# Patient Record
Sex: Female | Born: 1980 | Race: White | Hispanic: No | Marital: Married | Smoking: Never smoker
Health system: Southern US, Community
[De-identification: ages and names within clinical notes are randomized; demographics above are authoritative.]

## PROBLEM LIST (undated history)

## (undated) DIAGNOSIS — R12 Heartburn: Secondary | ICD-10-CM

## (undated) DIAGNOSIS — Z8042 Family history of malignant neoplasm of prostate: Secondary | ICD-10-CM

## (undated) DIAGNOSIS — R519 Headache, unspecified: Secondary | ICD-10-CM

## (undated) DIAGNOSIS — Z8049 Family history of malignant neoplasm of other genital organs: Secondary | ICD-10-CM

## (undated) DIAGNOSIS — R1903 Right lower quadrant abdominal swelling, mass and lump: Secondary | ICD-10-CM

## (undated) DIAGNOSIS — B019 Varicella without complication: Secondary | ICD-10-CM

## (undated) DIAGNOSIS — R51 Headache: Secondary | ICD-10-CM

## (undated) DIAGNOSIS — Z800 Family history of malignant neoplasm of digestive organs: Secondary | ICD-10-CM

## (undated) DIAGNOSIS — O26899 Other specified pregnancy related conditions, unspecified trimester: Secondary | ICD-10-CM

## (undated) HISTORY — DX: Family history of malignant neoplasm of other genital organs: Z80.49

## (undated) HISTORY — PX: TOTAL ABDOMINAL HYSTERECTOMY: SHX209

## (undated) HISTORY — DX: Family history of malignant neoplasm of digestive organs: Z80.0

## (undated) HISTORY — PX: WISDOM TOOTH EXTRACTION: SHX21

## (undated) HISTORY — DX: Headache: R51

## (undated) HISTORY — DX: Family history of malignant neoplasm of prostate: Z80.42

## (undated) HISTORY — DX: Headache, unspecified: R51.9

## (undated) HISTORY — DX: Varicella without complication: B01.9

## (undated) HISTORY — DX: Right lower quadrant abdominal swelling, mass and lump: R19.03

---

## 2011-10-02 DIAGNOSIS — IMO0002 Reserved for concepts with insufficient information to code with codable children: Secondary | ICD-10-CM | POA: Insufficient documentation

## 2012-05-15 LAB — OB RESULTS CONSOLE ABO/RH: RH Type: POSITIVE

## 2012-05-15 LAB — OB RESULTS CONSOLE ANTIBODY SCREEN: Antibody Screen: NEGATIVE

## 2012-05-15 LAB — OB RESULTS CONSOLE HEPATITIS B SURFACE ANTIGEN: Hepatitis B Surface Ag: NEGATIVE

## 2012-06-07 ENCOUNTER — Inpatient Hospital Stay (HOSPITAL_COMMUNITY): Admission: AD | Admit: 2012-06-07 | Payer: Self-pay | Source: Ambulatory Visit | Admitting: Obstetrics and Gynecology

## 2012-12-24 ENCOUNTER — Encounter (HOSPITAL_COMMUNITY): Payer: Self-pay

## 2012-12-24 NOTE — Patient Instructions (Addendum)
   Your procedure is scheduled on:  Thursday, Dec 18  Enter through the [employer] of [facility] at:  6 am Pick up the phone at the desk and dial [phone] and inform us of your arrival.  Please call this number if you have any problems the morning of surgery: [phone]  Remember: Do not eat or drink after midnight: Wednesday Take these medicines the morning of surgery with a SIP OF WATER:  None  Do not wear jewelry, make-up, or FINGER nail polish No metal in your hair or on your body. Do not wear lotions, powders, perfumes. You may wear deodorant.  Please use your CHG wash as directed prior to surgery.  Do not shave anywhere for at least 12 hours prior to first CHG shower.  Do not bring valuables to the hospital. Contacts, dentures or bridgework may not be worn into surgery.  Leave suitcase in the car. After Surgery it may be brought to your room. For patients being admitted to the hospital, checkout time is 11:00am the day of discharge. Home with husband Sydney Herman  cell [phone].

## 2012-12-25 ENCOUNTER — Encounter (HOSPITAL_COMMUNITY): Payer: Self-pay

## 2012-12-25 ENCOUNTER — Encounter (HOSPITAL_COMMUNITY)
Admission: RE | Admit: 2012-12-25 | Discharge: 2012-12-25 | Disposition: A | Discharge status: Home / Self Care | Payer: 59 | Source: Ambulatory Visit | Attending: Obstetrics and Gynecology | Admitting: Obstetrics and Gynecology

## 2012-12-25 ENCOUNTER — Encounter (HOSPITAL_COMMUNITY): Payer: Self-pay | Admitting: Pharmacy Technician

## 2012-12-25 HISTORY — DX: Other specified pregnancy related conditions, unspecified trimester: O26.899

## 2012-12-25 HISTORY — DX: Heartburn: R12

## 2012-12-25 LAB — CBC
HCT: 38.7 % (ref 36.0–46.0)
MCH: 29.7 pg (ref 26.0–34.0)
MCHC: 34.1 g/dL (ref 30.0–36.0)
Platelets: 150 10*3/uL (ref 150–400)
RDW: 13.9 % (ref 11.5–15.5)
WBC: 12.1 10*3/uL — ABNORMAL HIGH (ref 4.0–10.5)

## 2012-12-25 LAB — RPR: RPR Ser Ql: NONREACTIVE

## 2012-12-25 LAB — TYPE AND SCREEN
ABO/RH(D): O POS
Antibody Screen: NEGATIVE

## 2012-12-25 NOTE — H&P (Addendum)
32 yo G2P1 @ 39 wks presents for repeat c=-section, declines TOL  Past History - see hollister  AF, VSS Gen - NAD Abd - gravid, NT CV - RRR Lungs - clear Ext - NT Cvx - closed  A/p:  Prior c-section, desires repeat R/b/a discussed, informed consent.

## 2012-12-27 ENCOUNTER — Encounter (HOSPITAL_COMMUNITY): Payer: Self-pay | Admitting: *Deleted

## 2012-12-27 ENCOUNTER — Encounter (HOSPITAL_COMMUNITY): Payer: 59 | Admitting: Anesthesiology

## 2012-12-27 ENCOUNTER — Inpatient Hospital Stay (HOSPITAL_COMMUNITY): Admission: AD | Admit: 2012-12-27 | Payer: 59 | Source: Ambulatory Visit | Admitting: Obstetrics and Gynecology

## 2012-12-27 ENCOUNTER — Encounter (HOSPITAL_COMMUNITY)
Admission: RE | Disposition: A | Discharge status: Home / Self Care | Payer: Self-pay | Source: Ambulatory Visit | Attending: Obstetrics and Gynecology

## 2012-12-27 ENCOUNTER — Inpatient Hospital Stay (HOSPITAL_COMMUNITY)
Admission: RE | Admit: 2012-12-27 | Discharge: 2012-12-29 | DRG: 766 | Disposition: A | Discharge status: Home / Self Care | Payer: 59 | Source: Ambulatory Visit | Attending: Obstetrics and Gynecology | Admitting: Obstetrics and Gynecology

## 2012-12-27 ENCOUNTER — Inpatient Hospital Stay (HOSPITAL_COMMUNITY): Payer: 59 | Admitting: Anesthesiology

## 2012-12-27 ENCOUNTER — Inpatient Hospital Stay (HOSPITAL_COMMUNITY): Admission: AD | Admit: 2012-12-27 | Payer: Self-pay | Source: Ambulatory Visit | Admitting: Obstetrics and Gynecology

## 2012-12-27 DIAGNOSIS — Z98891 History of uterine scar from previous surgery: Secondary | ICD-10-CM

## 2012-12-27 DIAGNOSIS — O34219 Maternal care for unspecified type scar from previous cesarean delivery: Principal | ICD-10-CM | POA: Diagnosis present

## 2012-12-27 SURGERY — Surgical Case
Anesthesia: Spinal | Site: Abdomen

## 2012-12-27 MED ORDER — NALOXONE HCL 1 MG/ML IJ SOLN
1.0000 ug/kg/h | INTRAVENOUS | Status: DC | PRN
  Filled 2012-12-27: qty 2

## 2012-12-27 MED ORDER — OXYTOCIN 40 UNITS IN LACTATED RINGERS INFUSION - SIMPLE MED: 62.5000 mL/h | INTRAVENOUS | Status: AC

## 2012-12-27 MED ORDER — CLINDAMYCIN PHOSPHATE 900 MG/50ML IV SOLN
900.0000 mg | Freq: Once | INTRAVENOUS | Status: AC
  Administered 2012-12-27: 900 mg via INTRAVENOUS
  Filled 2012-12-27: qty 50

## 2012-12-27 MED ORDER — MEPERIDINE HCL 25 MG/ML IJ SOLN: 6.2500 mg | INTRAMUSCULAR | Status: DC | PRN

## 2012-12-27 MED ORDER — MORPHINE SULFATE (PF) 0.5 MG/ML IJ SOLN
INTRAMUSCULAR | Status: DC | PRN
  Administered 2012-12-27: .15 mg via EPIDURAL

## 2012-12-27 MED ORDER — FENTANYL CITRATE 0.05 MG/ML IJ SOLN
INTRAMUSCULAR | Status: DC | PRN
  Administered 2012-12-27: 25 ug via INTRATHECAL

## 2012-12-27 MED ORDER — OXYTOCIN 10 UNIT/ML IJ SOLN
INTRAMUSCULAR | Status: AC
  Filled 2012-12-27: qty 4

## 2012-12-27 MED ORDER — DIPHENHYDRAMINE HCL 50 MG/ML IJ SOLN
12.5000 mg | INTRAMUSCULAR | Status: DC | PRN
Start: 2012-12-27 — End: 2012-12-29
  Administered 2012-12-27: 12.5 mg via INTRAVENOUS
  Filled 2012-12-27: qty 1

## 2012-12-27 MED ORDER — LANOLIN HYDROUS EX OINT: 1.0000 "application " | TOPICAL_OINTMENT | CUTANEOUS | Status: DC | PRN

## 2012-12-27 MED ORDER — OXYCODONE-ACETAMINOPHEN 5-325 MG PO TABS
1.0000 | ORAL_TABLET | ORAL | Status: DC | PRN
Start: 2012-12-27 — End: 2012-12-29
  Administered 2012-12-27 – 2012-12-29 (×7): 1 via ORAL
  Filled 2012-12-27 (×8): qty 1

## 2012-12-27 MED ORDER — FENTANYL CITRATE 0.05 MG/ML IJ SOLN: 25.0000 ug | INTRAMUSCULAR | Status: DC | PRN

## 2012-12-27 MED ORDER — ONDANSETRON HCL 4 MG PO TABS: 4.0000 mg | ORAL_TABLET | ORAL | Status: DC | PRN

## 2012-12-27 MED ORDER — NALBUPHINE HCL 10 MG/ML IJ SOLN
5.0000 mg | INTRAMUSCULAR | Status: DC | PRN
  Filled 2012-12-27: qty 1

## 2012-12-27 MED ORDER — KETOROLAC TROMETHAMINE 30 MG/ML IJ SOLN
30.0000 mg | Freq: Once | INTRAMUSCULAR | Status: AC
  Administered 2012-12-27: 30 mg via INTRAVENOUS

## 2012-12-27 MED ORDER — SCOPOLAMINE 1 MG/3DAYS TD PT72
MEDICATED_PATCH | TRANSDERMAL | Status: AC
  Filled 2012-12-27: qty 1

## 2012-12-27 MED ORDER — MEASLES, MUMPS & RUBELLA VAC ~~LOC~~ INJ
0.5000 mL | INJECTION | Freq: Once | SUBCUTANEOUS | Status: DC
  Filled 2012-12-27: qty 0.5

## 2012-12-27 MED ORDER — PHENYLEPHRINE 8 MG IN D5W 100 ML (0.08MG/ML) PREMIX OPTIME
INJECTION | INTRAVENOUS | Status: AC
  Filled 2012-12-27: qty 100

## 2012-12-27 MED ORDER — NALOXONE HCL 0.4 MG/ML IJ SOLN: 0.4000 mg | INTRAMUSCULAR | Status: DC | PRN

## 2012-12-27 MED ORDER — LACTATED RINGERS IV SOLN
INTRAVENOUS | Status: DC
  Administered 2012-12-27 (×3): via INTRAVENOUS

## 2012-12-27 MED ORDER — MEDROXYPROGESTERONE ACETATE 150 MG/ML IM SUSP: 150.0000 mg | INTRAMUSCULAR | Status: DC | PRN

## 2012-12-27 MED ORDER — SCOPOLAMINE 1 MG/3DAYS TD PT72: 1.0000 | MEDICATED_PATCH | Freq: Once | TRANSDERMAL | Status: DC

## 2012-12-27 MED ORDER — PHENYLEPHRINE 8 MG IN D5W 100 ML (0.08MG/ML) PREMIX OPTIME
INJECTION | INTRAVENOUS | Status: DC | PRN
  Administered 2012-12-27: 60 ug/min via INTRAVENOUS

## 2012-12-27 MED ORDER — DIPHENHYDRAMINE HCL 25 MG PO CAPS: 25.0000 mg | ORAL_CAPSULE | Freq: Four times a day (QID) | ORAL | Status: DC | PRN

## 2012-12-27 MED ORDER — SIMETHICONE 80 MG PO CHEW
80.0000 mg | CHEWABLE_TABLET | Freq: Three times a day (TID) | ORAL | Status: DC
  Administered 2012-12-27 – 2012-12-28 (×3): 80 mg via ORAL
  Filled 2012-12-27 (×5): qty 1

## 2012-12-27 MED ORDER — ONDANSETRON HCL 4 MG/2ML IJ SOLN
INTRAMUSCULAR | Status: AC
  Filled 2012-12-27: qty 2

## 2012-12-27 MED ORDER — KETOROLAC TROMETHAMINE 30 MG/ML IJ SOLN
INTRAMUSCULAR | Status: AC
  Administered 2012-12-27: 30 mg via INTRAVENOUS
  Filled 2012-12-27: qty 1

## 2012-12-27 MED ORDER — SENNOSIDES-DOCUSATE SODIUM 8.6-50 MG PO TABS
2.0000 | ORAL_TABLET | ORAL | Status: DC
  Administered 2012-12-27 – 2012-12-28 (×2): 2 via ORAL
  Filled 2012-12-27 (×2): qty 2

## 2012-12-27 MED ORDER — DIBUCAINE 1 % RE OINT: 1.0000 "application " | TOPICAL_OINTMENT | RECTAL | Status: DC | PRN

## 2012-12-27 MED ORDER — TETANUS-DIPHTH-ACELL PERTUSSIS 5-2.5-18.5 LF-MCG/0.5 IM SUSP: 0.5000 mL | Freq: Once | INTRAMUSCULAR | Status: DC

## 2012-12-27 MED ORDER — FENTANYL CITRATE 0.05 MG/ML IJ SOLN
INTRAMUSCULAR | Status: AC
  Filled 2012-12-27: qty 2

## 2012-12-27 MED ORDER — DEXTROSE IN LACTATED RINGERS 5 % IV SOLN: INTRAVENOUS | Status: DC

## 2012-12-27 MED ORDER — DIPHENHYDRAMINE HCL 25 MG PO CAPS: 25.0000 mg | ORAL_CAPSULE | ORAL | Status: DC | PRN

## 2012-12-27 MED ORDER — ONDANSETRON HCL 4 MG/2ML IJ SOLN: 4.0000 mg | INTRAMUSCULAR | Status: DC | PRN

## 2012-12-27 MED ORDER — OXYTOCIN 10 UNIT/ML IJ SOLN
INTRAMUSCULAR | Status: DC | PRN
  Administered 2012-12-27: 40 [IU] via INTRAMUSCULAR

## 2012-12-27 MED ORDER — IBUPROFEN 600 MG PO TABS
600.0000 mg | ORAL_TABLET | Freq: Four times a day (QID) | ORAL | Status: DC
  Administered 2012-12-27 – 2012-12-29 (×8): 600 mg via ORAL
  Filled 2012-12-27 (×8): qty 1

## 2012-12-27 MED ORDER — MENTHOL 3 MG MT LOZG: 1.0000 | LOZENGE | OROMUCOSAL | Status: DC | PRN

## 2012-12-27 MED ORDER — PRENATAL MULTIVITAMIN CH
1.0000 | ORAL_TABLET | Freq: Every day | ORAL | Status: DC
  Administered 2012-12-28 – 2012-12-29 (×2): 1 via ORAL
  Filled 2012-12-27 (×2): qty 1

## 2012-12-27 MED ORDER — DIPHENHYDRAMINE HCL 50 MG/ML IJ SOLN: 25.0000 mg | INTRAMUSCULAR | Status: DC | PRN

## 2012-12-27 MED ORDER — CEFAZOLIN SODIUM-DEXTROSE 2-3 GM-% IV SOLR: 2.0000 g | INTRAVENOUS | Status: DC

## 2012-12-27 MED ORDER — SIMETHICONE 80 MG PO CHEW
80.0000 mg | CHEWABLE_TABLET | ORAL | Status: DC | PRN
  Administered 2012-12-28: 80 mg via ORAL

## 2012-12-27 MED ORDER — ONDANSETRON HCL 4 MG/2ML IJ SOLN
INTRAMUSCULAR | Status: DC | PRN
  Administered 2012-12-27: 4 mg via INTRAVENOUS

## 2012-12-27 MED ORDER — ONDANSETRON HCL 4 MG/2ML IJ SOLN: 4.0000 mg | Freq: Three times a day (TID) | INTRAMUSCULAR | Status: DC | PRN

## 2012-12-27 MED ORDER — MORPHINE SULFATE 0.5 MG/ML IJ SOLN
INTRAMUSCULAR | Status: AC
  Filled 2012-12-27: qty 10

## 2012-12-27 MED ORDER — WITCH HAZEL-GLYCERIN EX PADS: 1.0000 "application " | MEDICATED_PAD | CUTANEOUS | Status: DC | PRN

## 2012-12-27 MED ORDER — SCOPOLAMINE 1 MG/3DAYS TD PT72
1.0000 | MEDICATED_PATCH | Freq: Once | TRANSDERMAL | Status: DC
  Filled 2012-12-27: qty 1

## 2012-12-27 MED ORDER — SODIUM CHLORIDE 0.9 % IJ SOLN: 3.0000 mL | INTRAMUSCULAR | Status: DC | PRN

## 2012-12-27 MED ORDER — SIMETHICONE 80 MG PO CHEW
80.0000 mg | CHEWABLE_TABLET | ORAL | Status: DC
  Administered 2012-12-27 – 2012-12-28 (×2): 80 mg via ORAL
  Filled 2012-12-27 (×2): qty 1

## 2012-12-27 MED ORDER — METOCLOPRAMIDE HCL 5 MG/ML IJ SOLN: 10.0000 mg | Freq: Three times a day (TID) | INTRAMUSCULAR | Status: DC | PRN

## 2012-12-27 SURGICAL SUPPLY — 30 items
CLAMP CORD UMBIL (MISCELLANEOUS) IMPLANT
CLOTH BEACON ORANGE TIMEOUT ST (SAFETY) ×2 IMPLANT
DERMABOND ADVANCED (GAUZE/BANDAGES/DRESSINGS) ×1
DERMABOND ADVANCED .7 DNX12 (GAUZE/BANDAGES/DRESSINGS) ×1 IMPLANT
DRAPE LG THREE QUARTER DISP (DRAPES) IMPLANT
DRSG OPSITE POSTOP 4X10 (GAUZE/BANDAGES/DRESSINGS) ×2 IMPLANT
DURAPREP 26ML APPLICATOR (WOUND CARE) ×2 IMPLANT
ELECT REM PT RETURN 9FT ADLT (ELECTROSURGICAL) ×2
ELECTRODE REM PT RTRN 9FT ADLT (ELECTROSURGICAL) ×1 IMPLANT
EXTRACTOR VACUUM M CUP 4 TUBE (SUCTIONS) IMPLANT
GLOVE BIO SURGEON STRL SZ 6.5 (GLOVE) ×2 IMPLANT
GLOVE BIOGEL PI IND STRL 7.0 (GLOVE) ×1 IMPLANT
GLOVE BIOGEL PI INDICATOR 7.0 (GLOVE) ×1
GOWN PREVENTION PLUS XLARGE (GOWN DISPOSABLE) ×4 IMPLANT
GOWN STRL REIN XL XLG (GOWN DISPOSABLE) ×4 IMPLANT
KIT ABG SYR 3ML LUER SLIP (SYRINGE) ×2 IMPLANT
NEEDLE HYPO 25X5/8 SAFETYGLIDE (NEEDLE) ×2 IMPLANT
NS IRRIG 1000ML POUR BTL (IV SOLUTION) ×2 IMPLANT
PACK C SECTION WH (CUSTOM PROCEDURE TRAY) ×2 IMPLANT
PAD OB MATERNITY 4.3X12.25 (PERSONAL CARE ITEMS) ×2 IMPLANT
STAPLER VISISTAT 35W (STAPLE) IMPLANT
SUT CHROMIC 0 CT 802H (SUTURE) IMPLANT
SUT CHROMIC 0 CTX 36 (SUTURE) ×6 IMPLANT
SUT MON AB-0 CT1 36 (SUTURE) ×2 IMPLANT
SUT PDS AB 0 CTX 60 (SUTURE) ×2 IMPLANT
SUT PLAIN 0 NONE (SUTURE) IMPLANT
SUT VIC AB 4-0 KS 27 (SUTURE) IMPLANT
TOWEL OR 17X24 6PK STRL BLUE (TOWEL DISPOSABLE) ×2 IMPLANT
TRAY FOLEY CATH 14FR (SET/KITS/TRAYS/PACK) IMPLANT
WATER STERILE IRR 1000ML POUR (IV SOLUTION) IMPLANT

## 2012-12-27 NOTE — Anesthesia Postprocedure Evaluation (Signed)
  Anesthesia Post-op Note  Patient: Sydney Herman  Procedure(s) Performed: Procedure(s) with comments: REPEAT CESAREAN SECTION (N/A) - REPEAT  [facility] 12/25  Patient is awake, responsive, moving her legs, and has signs of resolution of her numbness. Pain and nausea are reasonably well controlled. Vital signs are stable and clinically acceptable. Oxygen saturation is clinically acceptable. There are no apparent anesthetic complications at this time. Patient is ready for discharge.

## 2012-12-27 NOTE — Anesthesia Postprocedure Evaluation (Signed)
Anesthesia Post Note  Patient: Sydney Herman  Procedure(s) Performed: Procedure(s) (LRB): REPEAT CESAREAN SECTION (N/A)  Anesthesia type: Spinal  Patient location: Mother/Baby  Post pain: Pain level controlled  Post assessment: Post-op Vital signs reviewed  Last Vitals:  Filed Vitals:   12/27/12 1411  BP: 94/60  Pulse: 67  Temp:   Resp: 18    Post vital signs: Reviewed  Level of consciousness: awake  Complications: No apparent anesthesia complications

## 2012-12-27 NOTE — Anesthesia Procedure Notes (Signed)
Spinal  Patient location during procedure: OR Preanesthetic Checklist Completed: patient identified, site marked, surgical consent, pre-op evaluation, timeout performed, IV checked, risks and benefits discussed and monitors and equipment checked Spinal Block Patient position: sitting Prep: DuraPrep Patient monitoring: heart rate, cardiac monitor, continuous pulse ox and blood pressure Approach: midline Location: L3-4 Injection technique: single-shot Needle Needle type: Sprotte  Needle gauge: 24 G Needle length: 9 cm Assessment Sensory level: T4 Additional Notes Spinal Dosage in OR  Bupivicaine ml       1.3 PFMS04   mcg        150 Fentanyl mcg            25    

## 2012-12-27 NOTE — Lactation Note (Signed)
This note was copied from the chart of Sydney Herman. Lactation Consultation Note  Patient Name: Sydney Joelys Staubs ZOXWR'U Date: 12/27/2012 Reason for consult: Initial assessment  Visited with Mom, baby at 67 hrs old.  Baby has had 3 breast feeds, latch score 8 with last feeding.  Mom states baby latches well, reviewed basics.  Baby began rooting and Mom placed baby in cradle hold and baby was having difficulty finding the nipple to latch.  Baby dressed in clothing.  Talked to Mom about using manual breast expression and switching to cross cradle hold to better control the latch.  Talked about importance of a wide and deep gape of mouth on the breast.  After a few attempts, baby was able to latch and become rhythmic on the right side.  Mom denied feeling any discomfort. Encouraged skin to skin and cue based feedings.   Brochure given to Mom.  Informed her of IP and OP lactation services available to her.  To call prn.  Follow up in am.    Consult Status Consult Status: Follow-up Date: 12/28/12 Follow-up type: In-patient    Judee Clara 12/27/2012, 2:46 PM

## 2012-12-27 NOTE — Transfer of Care (Signed)
Immediate Anesthesia Transfer of Care Note  Patient: Sydney Herman  Procedure(s) Performed: Procedure(s) with comments: REPEAT CESAREAN SECTION (N/A) - REPEAT  [facility] 12/25  Patient Location: PACU  Anesthesia Type:Spinal  Level of Consciousness: awake  Airway & Oxygen Therapy: Patient Spontanous Breathing  Post-op Assessment: Report given to PACU RN  Post vital signs: Reviewed and stable  Complications: No apparent anesthesia complications

## 2012-12-27 NOTE — Op Note (Signed)
Sydney Herman  12/27/2012  Indications: Scheduled Proceedure/Maternal Request   Pre-operative Diagnosis: PREVIOUS.   Post-operative Diagnosis: Same   Surgeon: Surgeon(s) and Role:    * Zelphia Cairo, MD - Primary   Assistants: none  Anesthesia: spinal   Procedure Details:  The patient was seen in the Holding Room. The risks, benefits, complications, treatment options, and expected outcomes were discussed with the patient. The patient concurred with the proposed plan, giving informed consent. identified as [name] and the procedure verified as C-Section Delivery. A Time Out was held and the above information confirmed.  After induction of anesthesia, the patient was draped and prepped in the usual sterile manner. A transverse was made and carried down through the subcutaneous tissue to the fascia. Fascial incision was made and extended transversely. The fascia was separated from the underlying rectus tissue superiorly and inferiorly. The peritoneum was identified and entered. Peritoneal incision was extended longitudinally. The utero-vesical peritoneal reflection was incised transversely and the bladder flap was bluntly freed from the lower uterine segment. A low transverse uterine incision was made. Delivered from cephalic presentation was a vigerous female with Apgar scores of 8 at one minute and 9 at five minutes. Cord ph was not sent the umbilical cord was clamped and cut cord blood was obtained for evaluation. The placenta was removed Intact and appeared normal. The uterine outline, tubes and ovaries appeared normal}. The uterine incision was closed with running locked sutures of 0chromic gut.   Hemostasis was observed. Lavage was carried out until clear. The fascia was then reapproximated with running sutures of 0PDS.  The skin was closed with 4-0Vicryl.   Instrument, sponge, and needle counts were correct prior the abdominal closure and were correct  at the conclusion of the case.     Estimated Blood Loss: 500cc  Urine Output: clear  Specimens: @ORSPECIMEN @   Complications: no complications  Disposition: PACU - hemodynamically stable.   Maternal Condition: stable   Baby condition / location:  Couplet care / Skin to Skin  Attending Attestation: I was present and scrubbed for the entire procedure.   Signed: Surgeon(s): Zelphia Cairo, MD

## 2012-12-27 NOTE — Addendum Note (Signed)
Addendum created 12/27/12 1516 by Algis Greenhouse, CRNA   Modules edited: Anesthesia Responsible Staff

## 2012-12-27 NOTE — Anesthesia Preprocedure Evaluation (Addendum)
Anesthesia Evaluation  Patient identified by MRN, date of birth, ID band Patient awake    Reviewed: Allergy & Precautions, H&P , Patient's Chart, lab work & pertinent test results  History of Anesthesia Complications (+) PONV  Airway Mallampati: II TM Distance: >3 FB Neck ROM: full    Dental no notable dental hx.    Pulmonary  breath sounds clear to auscultation  Pulmonary exam normal       Cardiovascular Exercise Tolerance: Good Rhythm:regular Rate:Normal     Neuro/Psych    GI/Hepatic   Endo/Other    Renal/GU      Musculoskeletal   Abdominal   Peds  Hematology   Anesthesia Other Findings   Reproductive/Obstetrics                           Anesthesia Physical Anesthesia Plan  ASA: II  Anesthesia Plan: Spinal   Post-op Pain Management:    Induction:   Airway Management Planned:   Additional Equipment:   Intra-op Plan:   Post-operative Plan:   Informed Consent: I have reviewed the patients History and Physical, chart, labs and discussed the procedure including the risks, benefits and alternatives for the proposed anesthesia with the patient or authorized representative who has indicated his/her understanding and acceptance.   Dental Advisory Given  Plan Discussed with: CRNA  Anesthesia Plan Comments: (Lab work confirmed with CRNA in room. Platelets okay. Discussed spinal anesthetic, and patient consents to the procedure:  included risk of possible headache,backache, failed block, allergic reaction, and nerve injury. This patient was asked if she had any questions or concerns before the procedure started. )        Anesthesia Quick Evaluation  

## 2012-12-28 ENCOUNTER — Encounter (HOSPITAL_COMMUNITY): Payer: Self-pay | Admitting: Obstetrics and Gynecology

## 2012-12-28 LAB — CBC
Hemoglobin: 11.1 g/dL — ABNORMAL LOW (ref 12.0–15.0)
MCH: 29.8 pg (ref 26.0–34.0)
MCHC: 33.9 g/dL (ref 30.0–36.0)
MCV: 87.7 fL (ref 78.0–100.0)
RBC: 3.73 MIL/uL — ABNORMAL LOW (ref 3.87–5.11)

## 2012-12-28 NOTE — Progress Notes (Signed)
Subjective: Postpartum Day 1: Cesarean Delivery Patient reports tolerating PO and no problems voiding.    Objective: Vital signs in last 24 hours: Temp:  [97.6 F (36.4 C)-98.2 F (36.8 C)] 97.7 F (36.5 C) (12/19 0334) Pulse Rate:  [56-86] 60 (12/19 0334) Resp:  [15-26] 18 (12/19 0334) BP: (94-105)/(59-72) 96/63 mmHg (12/19 0334) SpO2:  [94 %-99 %] 95 % (12/19 0334) Weight:  [149 lb 14.6 oz (68 kg)] 149 lb 14.6 oz (68 kg) (12/18 0945)  Physical Exam:  General: alert and cooperative Lochia: appropriate Uterine Fundus: firm Incision: honeycomb dressing with scant drainage noted on R margin of the bandage DVT Evaluation: No evidence of DVT seen on physical exam. Negative Homan's sign. No cords or calf tenderness. No significant calf/ankle edema.   Recent Labs  12/25/12 1015 12/28/12 0540  HGB 13.2 11.1*  HCT 38.7 32.7*    Assessment/Plan: Status post Cesarean section. Doing well postoperatively.  Desires circ.  Vicie Cech G 12/28/2012, 8:06 AM

## 2012-12-29 MED ORDER — OXYCODONE-ACETAMINOPHEN 5-325 MG PO TABS: 1.0000 | ORAL_TABLET | ORAL | Status: DC | PRN

## 2012-12-29 MED ORDER — IBUPROFEN 600 MG PO TABS: 600.0000 mg | ORAL_TABLET | Freq: Four times a day (QID) | ORAL | Status: DC

## 2012-12-29 NOTE — Discharge Summary (Signed)
Obstetric Discharge Summary Reason for Admission: cesarean section Prenatal Procedures: none Intrapartum Procedures: cesarean: low cervical, transverse Postpartum Procedures: none Complications-Operative and Postpartum: none Hemoglobin  Date Value Range Status  12/28/2012 11.1* 12.0 - 15.0 g/dL Final     HCT  Date Value Range Status  12/28/2012 32.7* 36.0 - 46.0 % Final    Physical Exam:  General: alert Lochia: appropriate Uterine Fundus: firm Incision: healing well DVT Evaluation: No evidence of DVT seen on physical exam.  Discharge Diagnoses: Term Pregnancy-delivered  Discharge Information: Date: 12/29/2012 Activity: pelvic rest Diet: routine Medications: PNV, Ibuprofen and Percocet Condition: stable Instructions: refer to practice specific booklet Discharge to: home Follow-up Information   Follow up with [facility] [place], [place]. In 1 week.   Contact information:   [address] [place] [address] [phone]      Newborn Data: Live born female  Birth Weight: 7 lb 12.7 oz (3535 g) APGAR: 8, 9  Home with mother.  Cora Stetson M 12/29/2012, 11:05 AM

## 2012-12-29 NOTE — Lactation Note (Addendum)
This note was copied from the chart of Sydney Kikuye Korenek. Lactation Consultation Note; Follow up visit with mom before DC. She called for assist because her nipples are getting sore and baby has been nursing a lot through the night,. Mom has baby latched to left breast when I went into room. Positional stripe noted on right nipple. Encouraged to change positions- she is using cradle hold mostly to help with healing. Comfort gels given with instructions. Encouraged to rub EBM into nipples after nursing. Mom reports that breasts are feeling fuller this morning. No further questions at present. To call prn  Patient Name: Sydney Herman ZOXWR'U Date: 12/29/2012 Reason for consult: Follow-up assessment   Maternal Data Formula Feeding for Exclusion: No Does the patient have breastfeeding experience prior to this delivery?: Yes  Feeding Feeding Type: Breast Fed Length of feed: 30 min  LATCH Score/Interventions Latch: Grasps breast easily, tongue down, lips flanged, rhythmical sucking.  Audible Swallowing: A few with stimulation  Type of Nipple: Everted at rest and after stimulation  Comfort (Breast/Nipple): Filling, red/small blisters or bruises, mild/mod discomfort  Problem noted: Filling;Mild/Moderate discomfort Interventions (Mild/moderate discomfort): Comfort gels;Hand expression  Hold (Positioning): No assistance needed to correctly position infant at breast. Intervention(s): Breastfeeding basics reviewed;Support Pillows;Position options  LATCH Score: 8  Lactation Tools Discussed/Used Tools: Comfort gels   Consult Status Consult Status: Complete    Pamelia Hoit 12/29/2012, 9:09 AM

## 2013-01-02 NOTE — Progress Notes (Signed)
Ur chart review per request. 

## 2013-04-18 ENCOUNTER — Encounter: Payer: Self-pay | Admitting: Family Medicine

## 2013-04-18 ENCOUNTER — Ambulatory Visit (INDEPENDENT_AMBULATORY_CARE_PROVIDER_SITE_OTHER): Payer: 59 | Admitting: Family Medicine

## 2013-04-18 VITALS — BP 104/70 | Temp 98.6°F | Ht 60.5 in | Wt 126.0 lb

## 2013-04-18 DIAGNOSIS — R1900 Intra-abdominal and pelvic swelling, mass and lump, unspecified site: Secondary | ICD-10-CM

## 2013-04-18 DIAGNOSIS — Z7189 Other specified counseling: Secondary | ICD-10-CM

## 2013-04-18 DIAGNOSIS — Z7689 Persons encountering health services in other specified circumstances: Secondary | ICD-10-CM

## 2013-04-18 DIAGNOSIS — R222 Localized swelling, mass and lump, trunk: Secondary | ICD-10-CM

## 2013-04-18 NOTE — Patient Instructions (Addendum)
-  We placed a referral for you as discussed to the surgeon. It usually takes about 1-2 weeks to process and schedule this referral. If you have not heard from [place] regarding this appointment in 2 weeks please contact our office.  -PLEASE SIGN UP FOR MYCHART TODAY   We recommend the following healthy lifestyle measures: - eat a healthy diet consisting of lots of vegetables, fruits, beans, nuts, seeds, healthy meats such as white chicken and fish and whole grains.  - avoid fried foods, fast food, processed foods, sodas, red meet and other fattening foods.  - get a least 150 minutes of aerobic exercise per week.   Follow up in: as needed

## 2013-04-18 NOTE — Progress Notes (Signed)
Pre visit review using our clinic review tool, if applicable. No additional management support is needed unless otherwise documented below in the visit note. 

## 2013-04-18 NOTE — Progress Notes (Signed)
Chief Complaint  Patient presents with  . Establish Care    HPI:  Sydney Herman is here to establish care.  Last PCP and physical[name]  Has the following chronic problems and concerns today:  Patient Active Problem List   Diagnosis Date Noted  . S/P cesarean section 12/27/2012   Mass Abd: -noticed recently, def since last birth 4 months ago -small mildly tender mass, bigger at time with certain actions -no other symptoms  Health Maintenance: -UTD  ROS: See pertinent positives and negatives per HPI.  Past Medical History  Diagnosis Date  . Heartburn in pregnancy   . Generalized headaches   . Chicken pox     Family History  Problem Relation Age of Onset  . Hypertension Mother   . Diabetes Father     History   Social History  . Marital Status: Married    Spouse Name: N/A    Number of Children: N/A  . Years of Education: N/A   Social History Main Topics  . Smoking status: Never Smoker   . Smokeless tobacco: Never Used  . Alcohol Use: No  . Drug Use: No  . Sexual Activity: Yes    Birth Control/ Protection: None     Comment: pregnant   Other Topics Concern  . None   Social History Narrative   Work or School: going back to work in Pharmacologist for [employer] Situation: lives with husband, 32 yo and 52 month old (04/2013)      Spiritual Beliefs: Christian      Lifestyle: exercising about 30-45 minutes 4-5 days per week; diet healthy             Current outpatient prescriptions:ibuprofen (ADVIL,MOTRIN) 600 MG tablet, Take 1 tablet (600 mg total) by mouth every 6 (six) hours., Disp: 30 tablet, Rfl: 0  EXAM:  Filed Vitals:   04/18/13 1415  BP: 104/70  Temp: 98.6 F (37 C)    Body mass index is 24.19 kg/(m^2).  GENERAL: vitals reviewed and listed above, alert, oriented, appears well hydrated and in no acute distress  HEENT: atraumatic, conjunttiva clear, no obvious abnormalities on inspection of  external nose and ears  NECK: no obvious masses on inspection  LUNGS: clear to auscultation bilaterally, no wheezes, rales or rhonchi, good air movement  CV: HRRR, no peripheral edema  ABD: small firm mass abd wall RLQ, mildly tender with valsalva  MS: moves all extremities without noticeable abnormality  PSYCH: pleasant and cooperative, no obvious depression or anxiety  ASSESSMENT AND PLAN:  Discussed the following assessment and plan:  Abdominal wall mass - Plan: Ambulatory referral to General Surgery  Encounter to establish care -query scar tissue versus hernia, discussed options, she decided to see general surgery  -We reviewed the PMH, PSH, FH, SH, Meds and Allergies. -We provided refills for any medications we will prescribe as needed. -We addressed current concerns per orders and patient instructions. -We have asked for records for pertinent exams, studies, vaccines and notes from previous providers. -We have advised patient to follow up per instructions below.   -Patient advised to return or notify a doctor immediately if symptoms worsen or persist or new concerns arise.  Patient Instructions  -We placed a referral for you as discussed to the surgeon. It usually takes about 1-2 weeks to process and schedule this referral. If you have not heard from [place] regarding this appointment in 2 weeks please contact  our office.  -PLEASE SIGN UP FOR MYCHART TODAY   We recommend the following healthy lifestyle measures: - eat a healthy diet consisting of lots of vegetables, fruits, beans, nuts, seeds, healthy meats such as white chicken and fish and whole grains.  - avoid fried foods, fast food, processed foods, sodas, red meet and other fattening foods.  - get a least 150 minutes of aerobic exercise per week.   Follow up in: as needed           Lucretia Kern

## 2013-05-03 ENCOUNTER — Ambulatory Visit (INDEPENDENT_AMBULATORY_CARE_PROVIDER_SITE_OTHER): Payer: 59 | Admitting: Surgery

## 2013-05-17 ENCOUNTER — Encounter (INDEPENDENT_AMBULATORY_CARE_PROVIDER_SITE_OTHER): Payer: Self-pay | Admitting: Surgery

## 2013-05-17 ENCOUNTER — Ambulatory Visit (INDEPENDENT_AMBULATORY_CARE_PROVIDER_SITE_OTHER): Payer: Commercial Managed Care - PPO | Admitting: Surgery

## 2013-05-17 VITALS — BP 110/65 | HR 59 | Temp 98.4°F | Resp 12 | Ht 61.0 in | Wt 124.4 lb

## 2013-05-17 DIAGNOSIS — R1903 Right lower quadrant abdominal swelling, mass and lump: Secondary | ICD-10-CM | POA: Insufficient documentation

## 2013-05-17 HISTORY — DX: Right lower quadrant abdominal swelling, mass and lump: R19.03

## 2013-05-17 NOTE — Progress Notes (Signed)
Patient ID: Sydney Herman, female   DOB: 1980-05-27, 33 y.o.   MRN: [id number]  Chief Complaint  Patient presents with  . Incisional Hernia    HPI Sydney Herman is a 33 y.o. female.  Referred by Dr. Colin Benton for evaluation of possible abdominal wall mass vs. hernia  HPI This is a 33 year old female in good health who presents after recent discovery of a hard palpable mass in her right lower abdomen just above her Pfannenstiel incision. This has been present for about 6 weeks. This has not enlarged. This is minimally uncomfortable. The patient has been breast-feeding since her child was born on 12/27/12. She has not yet had her first period. She states that the mass has not really enlarged she first found it. This does not cause any significant discomfort. She brought to the attention of her physician who thought that it might represent scar tissue versus hernia. She is referred for surgical evaluation.  Past Medical History  Diagnosis Date  . Heartburn in pregnancy   . Generalized headaches   . Chicken pox     Past Surgical History  Procedure Laterality Date  . Cesarean section  07/2010    [facility]  . Wisdom tooth extraction    . Cesarean section N/A 12/27/2012    Procedure: REPEAT CESAREAN SECTION;  Surgeon: [name], MD;  Location: [facility];  Service: Obstetrics;  Laterality: N/A;  REPEAT  EDC 12/25    Family History  Problem Relation Age of Onset  . Hypertension Mother   . Diabetes Father     Social History History  Substance Use Topics  . Smoking status: Never Smoker   . Smokeless tobacco: Never Used  . Alcohol Use: No    Allergies  Allergen Reactions  . Amoxil [Amoxicillin] Rash    No current outpatient prescriptions on file.   No current facility-administered medications for this visit.    Review of Systems Review of Systems  Constitutional: Negative for fever, chills and unexpected weight change.  HENT: Negative for congestion, hearing loss, sore  throat, trouble swallowing and voice change.   Eyes: Negative for visual disturbance.  Respiratory: Negative for cough and wheezing.   Cardiovascular: Negative for chest pain, palpitations and leg swelling.  Gastrointestinal: Negative for nausea, vomiting, abdominal pain, diarrhea, constipation, blood in stool, abdominal distention and anal bleeding.  Genitourinary: Negative for hematuria, vaginal bleeding and difficulty urinating.  Musculoskeletal: Negative for arthralgias.  Skin: Negative for rash and wound.  Neurological: Negative for seizures, syncope and headaches.  Hematological: Negative for adenopathy. Does not bruise/bleed easily.  Psychiatric/Behavioral: Negative for confusion.    Blood pressure 110/65, pulse 59, temperature 98.4 F (36.9 C), resp. rate 12, height 5\' 1"  (1.549 m), weight 124 lb 6.4 oz (56.427 kg), not currently breastfeeding.  Physical Exam Physical Exam WDWN in NAD HEENT:  EOMI, sclera anicteric Neck:  No masses, no thyromegaly Lungs:  CTA bilaterally; normal respiratory effort CV:  Regular rate and rhythm; no murmurs Abd:  +bowel sounds, soft, non-tender, healed Pfannenstiel incision; just above the right end of her incision, there is a 1.5 cm palpable mass.  This is not reducible and does not enlarge with Valsalva maneuver.   Ext:  Well-perfused; no edema Skin:  Warm, dry; no sign of jaundice  Data Reviewed none  Assessment    This small palpable mass does not appear to be a hernia.  This likely represents residual scar tissue from her cesarean section.  The patient is quite small and  this area is easily palpable.  Alternatively, this may represent a small endometrioma.     Plan    No surgical indications at this time.  If this area enlarges or begins to hurt, especially with menstrual cycle variations, would recommend excision under anesthesia.  Follow-up PRN        Imogene Burn. Aldene Hendon 05/17/2013, 12:59 PM

## 2013-11-11 ENCOUNTER — Encounter (INDEPENDENT_AMBULATORY_CARE_PROVIDER_SITE_OTHER): Payer: Self-pay | Admitting: Surgery

## 2014-08-04 ENCOUNTER — Telehealth: Payer: Self-pay | Admitting: Family Medicine

## 2014-08-04 NOTE — Telephone Encounter (Signed)
[place] with me if ok with Dr. Yong Channel - have only seen her once.

## 2014-08-04 NOTE — Telephone Encounter (Signed)
See below

## 2014-08-04 NOTE — Telephone Encounter (Signed)
LMOM for pt to call back.

## 2014-08-04 NOTE — Telephone Encounter (Signed)
Fine with me. Could be physical/establish same visit- though her GYN may code physical

## 2014-08-04 NOTE — Telephone Encounter (Signed)
Patient would like to transfer from Dr. Maudie Mercury to Dr. Yong Channel.  Please advise if transfer is okay.      *Dr. Yong Channel, if you accept patient she would like to know if a CPX can be done with new patient appointment?*

## 2014-08-04 NOTE — Telephone Encounter (Signed)
See below [place]

## 2014-09-19 ENCOUNTER — Other Ambulatory Visit (INDEPENDENT_AMBULATORY_CARE_PROVIDER_SITE_OTHER): Payer: 59

## 2014-09-19 DIAGNOSIS — Z0000 Encounter for general adult medical examination without abnormal findings: Secondary | ICD-10-CM

## 2014-09-19 LAB — CBC WITH DIFFERENTIAL/PLATELET
Basophils Absolute: 0 10*3/uL (ref 0.0–0.1)
Basophils Relative: 0.2 % (ref 0.0–3.0)
Eosinophils Absolute: 0 10*3/uL (ref 0.0–0.7)
Eosinophils Relative: 0.5 % (ref 0.0–5.0)
HCT: 36.9 % (ref 36.0–46.0)
Hemoglobin: 12.5 g/dL (ref 12.0–15.0)
LYMPHS ABS: 2 10*3/uL (ref 0.7–4.0)
Lymphocytes Relative: 24.8 % (ref 12.0–46.0)
MCHC: 33.9 g/dL (ref 30.0–36.0)
MCV: 87.7 fl (ref 78.0–100.0)
MONOS PCT: 4.8 % (ref 3.0–12.0)
Monocytes Absolute: 0.4 10*3/uL (ref 0.1–1.0)
NEUTROS ABS: 5.8 10*3/uL (ref 1.4–7.7)
Neutrophils Relative %: 69.7 % (ref 43.0–77.0)
PLATELETS: 245 10*3/uL (ref 150.0–400.0)
RBC: 4.21 Mil/uL (ref 3.87–5.11)
RDW: 13.3 % (ref 11.5–15.5)
WBC: 8.3 10*3/uL (ref 4.0–10.5)

## 2014-09-19 LAB — HEPATIC FUNCTION PANEL
ALBUMIN: 4.1 g/dL (ref 3.5–5.2)
ALK PHOS: 33 U/L — AB (ref 39–117)
ALT: 15 U/L (ref 0–35)
AST: 21 U/L (ref 0–37)
BILIRUBIN TOTAL: 0.5 mg/dL (ref 0.2–1.2)
Bilirubin, Direct: 0.1 mg/dL (ref 0.0–0.3)
Total Protein: 7.4 g/dL (ref 6.0–8.3)

## 2014-09-19 LAB — LIPID PANEL
CHOL/HDL RATIO: 2
Cholesterol: 185 mg/dL (ref 0–200)
HDL: 77.9 mg/dL (ref >39.00)
LDL CALC: 92 mg/dL (ref 0–99)
NONHDL: 107.47
TRIGLYCERIDES: 77 mg/dL (ref 0.0–149.0)
VLDL: 15.4 mg/dL (ref 0.0–40.0)

## 2014-09-19 LAB — BASIC METABOLIC PANEL
BUN: 13 mg/dL (ref 6–23)
CALCIUM: 9.3 mg/dL (ref 8.4–10.5)
CO2: 26 meq/L (ref 19–32)
Chloride: 103 mEq/L (ref 96–112)
Creatinine, Ser: 0.79 mg/dL (ref 0.40–1.20)
GFR: 89.06 mL/min (ref >60.00)
GLUCOSE: 88 mg/dL (ref 70–99)
Potassium: 4.9 mEq/L (ref 3.5–5.1)
Sodium: 137 mEq/L (ref 135–145)

## 2014-09-19 LAB — POCT URINALYSIS DIPSTICK
BILIRUBIN UA: NEGATIVE
Blood, UA: NEGATIVE
GLUCOSE UA: NEGATIVE
KETONES UA: NEGATIVE
LEUKOCYTES UA: NEGATIVE
NITRITE UA: NEGATIVE
PH UA: 6
Protein, UA: NEGATIVE
Spec Grav, UA: <=1.005
Urobilinogen, UA: 0.2

## 2014-09-19 LAB — TSH: TSH: 0.5 u[IU]/mL (ref 0.35–4.50)

## 2014-09-26 ENCOUNTER — Ambulatory Visit (INDEPENDENT_AMBULATORY_CARE_PROVIDER_SITE_OTHER): Payer: 59 | Admitting: Family Medicine

## 2014-09-26 ENCOUNTER — Encounter: Payer: Self-pay | Admitting: Family Medicine

## 2014-09-26 VITALS — BP 118/64 | HR 67 | Temp 98.4°F | Wt 120.0 lb

## 2014-09-26 DIAGNOSIS — Z0000 Encounter for general adult medical examination without abnormal findings: Secondary | ICD-10-CM | POA: Diagnosis not present

## 2014-09-26 DIAGNOSIS — Z23 Encounter for immunization: Secondary | ICD-10-CM | POA: Diagnosis not present

## 2014-09-26 DIAGNOSIS — R51 Headache: Secondary | ICD-10-CM

## 2014-09-26 DIAGNOSIS — R519 Headache, unspecified: Secondary | ICD-10-CM | POA: Insufficient documentation

## 2014-09-26 NOTE — Patient Instructions (Addendum)
Flu shot received today.  Sign release of information at the front desk for records from [facility]. Primarily need your pap smear.   Follow up in 1 year for a physical

## 2014-09-26 NOTE — Progress Notes (Signed)
Sydney Reddish, MD Phone: [phone]  Subjective:  Patient presents today for their annual physical and to establish care. Chief complaint-noted.   -Upper chest pressure. Once a week to once a month. When sitting down and goes to get up- often change in positions. No exertional component and exercises regularly- never has it with it. More often in times of stress. No relation to meals  -2 small children. Works part time. Started taking an herbal stress care. Takes "stress care supplement" from [employer] as advised by herb store- has felt some improvement.   -[facility] T[facility]. Sexually active only with husband- no STD concern.  2013 1 abnormal pap. Had colposcopy which was normal- yearly for at least 3 years now.   -Started taking probiotic- has had issues with constipation and that has helped  -healthy diet , active.   ROS- full  review of systems was completed and negative except for as noted above  The following were reviewed and entered/updated in epic: Past Medical History  Diagnosis Date  . Heartburn in pregnancy   . Generalized headaches   . Chicken pox    Patient Active Problem List   Diagnosis Date Noted  . 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 S/P cesarean section 12/27/2012   Past Surgical History  Procedure Laterality Date  . Cesarean section  07/2010    [facility]  . Wisdom tooth extraction    . Cesarean section N/A 12/27/2012    Procedure: REPEAT CESAREAN SECTION;  Surgeon: [name], MD;  Location: [facility];  Service: Obstetrics;  Laterality: N/A;  REPEAT  EDC 12/25    Family History  Problem Relation Age of Onset  . Hypertension Mother   . Diabetes Father     Medications- reviewed and updated No current outpatient prescriptions on file.   No current facility-administered medications for this visit.    Allergies-reviewed and updated Allergies  Allergen Reactions  . Amoxil  [Amoxicillin] Rash    Social History   Social History  . Marital Status: Married    Spouse Name: N/A  . Number of Children: N/A  . Years of Education: N/A   Social History Main Topics  . Smoking status: Never Smoker   . Smokeless tobacco: Never Used  . Alcohol Use: No  . Drug Use: No  . Sexual Activity: Yes    Birth Control/ Protection: None     Comment: pregnant   Other Topics Concern  . 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 Oropharynx normal Neck: no thyromegaly CV: RRR no murmurs rubs or gallops Lungs: CTAB no crackles, wheeze, rhonchi Abdomen: soft/nontender/nondistended/normal bowel sounds. No rebound or guarding.  Ext: no edema Skin: warm, dry Neuro: grossly normal, moves all extremities, PERRLA  Assessment/Plan:  34 y.o. female presenting for annual physical.  Health Maintenance counseling: 1. Anticipatory guidance: Patient counseled regarding regular dental exams, wearing seatbelts,  wearing sunscreen, yearly eye exams due to contacts 2. Risk factor reduction:  Advised patient of need for regular exercise and diet rich and fruits and vegetables to reduce risk of heart attack and stroke.  3. Immunizations/screenings/ancillary studies Health Maintenance Due  Topic Date Due  . PAP SMEAR - getting records 09/01/2002  . INFLUENZA VACCINE - got today 08/11/2014  4. Cervical cancer screening- through GYN 5. Breast cancer screening-  Not indicated at age 39. Colon cancer screening - age 5  7. Skin cancer screening- no skin concerns,  does not see dermatology  Discussed intermittent chest pain that goes along with stress is likely anxiety related. No exertional component. No shortness of breath- will monitor  Review HPI before next visit. Follow up CPE 1 year  Orders Placed This Encounter  Procedures  . Flu Vaccine QUAD 36+ mos IM

## 2015-02-24 DIAGNOSIS — Z01419 Encounter for gynecological examination (general) (routine) without abnormal findings: Secondary | ICD-10-CM | POA: Diagnosis not present

## 2015-02-24 DIAGNOSIS — Z6822 Body mass index (BMI) 22.0-22.9, adult: Secondary | ICD-10-CM | POA: Diagnosis not present

## 2015-05-30 ENCOUNTER — Emergency Department
Admission: EM | Admit: 2015-05-30 | Discharge: 2015-05-30 | Disposition: A | Discharge status: Home / Self Care | Payer: 59 | Source: Home / Self Care | Attending: Family Medicine | Admitting: Family Medicine

## 2015-05-30 ENCOUNTER — Encounter: Payer: Self-pay | Admitting: Emergency Medicine

## 2015-05-30 DIAGNOSIS — N390 Urinary tract infection, site not specified: Secondary | ICD-10-CM

## 2015-05-30 LAB — POCT URINALYSIS DIP (MANUAL ENTRY)
Bilirubin, UA: NEGATIVE
Glucose, UA: NEGATIVE
Ketones, POC UA: NEGATIVE
Nitrite, UA: POSITIVE — AB
Protein Ur, POC: NEGATIVE
Spec Grav, UA: <=1.005 (ref 1.005–1.03)
Urobilinogen, UA: NEGATIVE (ref 0–1)
pH, UA: 6.5 (ref 5–8)

## 2015-05-30 MED ORDER — CEPHALEXIN 500 MG PO CAPS: 500.0000 mg | ORAL_CAPSULE | Freq: Two times a day (BID) | ORAL | Status: DC

## 2015-05-30 NOTE — ED Notes (Signed)
Pt c/o dysuria, frequency and urgency that started this am. Denies fever or back pain.

## 2015-05-30 NOTE — ED Provider Notes (Signed)
CSN: [id number]     Arrival date & time 05/30/15  3 History   First MD Initiated Contact with Patient 05/30/15 1536     Chief Complaint  Patient presents with  . Dysuria   (Consider location/radiation/quality/duration/timing/severity/associated sxs/prior Treatment) HPI The pt is a 35yo female presenting to [facility] with c/o sudden onset dysuria with urinary frequency, bladder discomfort and dark urine this morning.  She did take Azo PTA but only reports minimal relief of symptoms. Hx of UTI several years ago. Symptoms are moderate in severity. Denies fever, chills, n/v/d. No vaginal symptoms.   Past Medical History  Diagnosis Date  . Heartburn in pregnancy   . Generalized headaches     Has had some migraines in past as well. once a week or less. Usually occipital and associated with sinus pressure as well.   . Chicken pox   . 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 Georgette Dover 2015- was thought to be scar tissue- no growth since that time    Past Surgical History  Procedure Laterality Date  . Cesarean section  07/2010    [facility]  . Wisdom tooth extraction    . Cesarean section N/A 12/27/2012    womens 2nd    Family History  Problem Relation Age of Onset  . Hypertension Mother   . Diabetes Father    Social History  Substance Use Topics  . Smoking status: Never Smoker   . Smokeless tobacco: Never Used  . Alcohol Use: No   OB History    Gravida Para Term Preterm AB TAB SAB Ectopic Multiple Living   2 2 2       2      Review of Systems  Constitutional: Negative for fever and chills.  Gastrointestinal: Positive for abdominal pain ( bladder discomfort). Negative for nausea, vomiting and diarrhea.  Genitourinary: Positive for dysuria, urgency, frequency and hematuria ( dark urine). Negative for flank pain, vaginal bleeding, vaginal discharge and vaginal pain.  Musculoskeletal: Negative for myalgias and back pain.    Allergies  Amoxil  Home Medications    Prior to Admission medications   Medication Sig Start Date End Date Taking? Authorizing Provider  norethindrone-ethinyl estradiol-iron (ESTROSTEP FE,TILIA FE,TRI-LEGEST FE) 1-20/1-30/1-35 MG-MCG tablet Take 1 tablet by mouth daily.   Yes Historical Provider, MD  cephALEXin (KEFLEX) 500 MG capsule Take 1 capsule (500 mg total) by mouth 2 (two) times daily. 05/30/15   Noland Fordyce, PA-C   Meds Ordered and Administered this Visit  Medications - No data to display  BP 123/79 mmHg  Pulse 78  Temp(Src) 97.6 F (36.4 C) (Oral)  Wt 119 lb (53.978 kg)  SpO2 100% No data found.   Physical Exam  Constitutional: She is oriented to person, place, and time. She appears well-developed and well-nourished.  HENT:  Head: Normocephalic and atraumatic.  Mouth/Throat: Oropharynx is clear and moist.  Eyes: EOM are normal.  Neck: Normal range of motion.  Cardiovascular: Normal rate, regular rhythm and normal heart sounds.   Pulmonary/Chest: Effort normal and breath sounds normal. No respiratory distress. She has no wheezes. She has no rales.  Abdominal: Soft. She exhibits no distension and no mass. There is no tenderness. There is no rebound, no guarding and no CVA tenderness.  Musculoskeletal: Normal range of motion.  Neurological: She is alert and oriented to person, place, and time.  Skin: Skin is warm and dry.  Psychiatric: She has a normal mood and affect. Her behavior  is normal.  Nursing note and vitals reviewed.   ED Course  Procedures (including critical care time)  Labs Review Labs Reviewed  POCT URINALYSIS DIP (MANUAL ENTRY) - Abnormal; Notable for the following:    Color, UA orange (*)    Clarity, UA cloudy (*)    Blood, UA moderate (*)    Nitrite, UA Positive (*)    Leukocytes, UA small (1+) (*)    All other components within normal limits  URINE CULTURE    Imaging Review No results found.    MDM   1. UTI (lower urinary tract infection)    Signs and symptoms c/w  UTI UA: c/w UTI Will send culture  Rx: Keflex May continue taking Azo. Encouraged to stay well hydrated. F/u with PCP in 4-5 days if not improving, sooner if worsening. Patient verbalized understanding and agreement with treatment plan.     Noland Fordyce, PA-C 05/30/15 1628

## 2015-05-30 NOTE — Discharge Instructions (Signed)

## 2015-06-02 LAB — URINE CULTURE: Colony Count: 95000

## 2015-06-03 ENCOUNTER — Telehealth: Payer: Self-pay | Admitting: Emergency Medicine

## 2015-10-29 ENCOUNTER — Other Ambulatory Visit (INDEPENDENT_AMBULATORY_CARE_PROVIDER_SITE_OTHER): Payer: 59

## 2015-10-29 DIAGNOSIS — Z0000 Encounter for general adult medical examination without abnormal findings: Secondary | ICD-10-CM | POA: Diagnosis not present

## 2015-10-29 DIAGNOSIS — R3129 Other microscopic hematuria: Secondary | ICD-10-CM

## 2015-10-29 LAB — HEPATIC FUNCTION PANEL
ALBUMIN: 4.3 g/dL (ref 3.5–5.2)
ALT: 17 U/L (ref 0–35)
AST: 26 U/L (ref 0–37)
Alkaline Phosphatase: 39 U/L (ref 39–117)
BILIRUBIN DIRECT: 0 mg/dL (ref 0.0–0.3)
TOTAL PROTEIN: 7.8 g/dL (ref 6.0–8.3)
Total Bilirubin: 0.3 mg/dL (ref 0.2–1.2)

## 2015-10-29 LAB — POC URINALSYSI DIPSTICK (AUTOMATED)
BILIRUBIN UA: NEGATIVE
GLUCOSE UA: NEGATIVE
KETONES UA: NEGATIVE
Leukocytes, UA: NEGATIVE
NITRITE UA: NEGATIVE
Protein, UA: NEGATIVE
Spec Grav, UA: <=1.005
Urobilinogen, UA: 0.2
pH, UA: 6

## 2015-10-29 LAB — LIPID PANEL
CHOLESTEROL: 202 mg/dL — AB (ref 0–200)
HDL: 85.5 mg/dL (ref >39.00)
LDL Cholesterol: 97 mg/dL (ref 0–99)
NonHDL: 116.63
TRIGLYCERIDES: 99 mg/dL (ref 0.0–149.0)
Total CHOL/HDL Ratio: 2
VLDL: 19.8 mg/dL (ref 0.0–40.0)

## 2015-10-29 LAB — CBC WITH DIFFERENTIAL/PLATELET
BASOS PCT: 0.2 % (ref 0.0–3.0)
Basophils Absolute: 0 10*3/uL (ref 0.0–0.1)
EOS ABS: 0 10*3/uL (ref 0.0–0.7)
EOS PCT: 0.4 % (ref 0.0–5.0)
HEMATOCRIT: 38.7 % (ref 36.0–46.0)
Hemoglobin: 13.1 g/dL (ref 12.0–15.0)
LYMPHS PCT: 19.9 % (ref 12.0–46.0)
Lymphs Abs: 1.8 10*3/uL (ref 0.7–4.0)
MCHC: 33.7 g/dL (ref 30.0–36.0)
MCV: 87.6 fl (ref 78.0–100.0)
Monocytes Absolute: 0.4 10*3/uL (ref 0.1–1.0)
Monocytes Relative: 4.5 % (ref 3.0–12.0)
NEUTROS ABS: 6.6 10*3/uL (ref 1.4–7.7)
Neutrophils Relative %: 75 % (ref 43.0–77.0)
PLATELETS: 233 10*3/uL (ref 150.0–400.0)
RBC: 4.42 Mil/uL (ref 3.87–5.11)
RDW: 13.2 % (ref 11.5–15.5)
WBC: 8.8 10*3/uL (ref 4.0–10.5)

## 2015-10-29 LAB — BASIC METABOLIC PANEL
BUN: 18 mg/dL (ref 6–23)
CHLORIDE: 100 meq/L (ref 96–112)
CO2: 27 meq/L (ref 19–32)
CREATININE: 0.81 mg/dL (ref 0.40–1.20)
Calcium: 9.3 mg/dL (ref 8.4–10.5)
GFR: 85.95 mL/min (ref >60.00)
Glucose, Bld: 83 mg/dL (ref 70–99)
Potassium: 4 mEq/L (ref 3.5–5.1)
Sodium: 135 mEq/L (ref 135–145)

## 2015-10-29 LAB — URINALYSIS, MICROSCOPIC ONLY: RBC / HPF: NONE SEEN (ref 0–?)

## 2015-10-29 LAB — TSH: TSH: 0.53 u[IU]/mL (ref 0.35–4.50)

## 2015-11-02 ENCOUNTER — Encounter: Payer: Self-pay | Admitting: Family Medicine

## 2015-11-02 ENCOUNTER — Ambulatory Visit (INDEPENDENT_AMBULATORY_CARE_PROVIDER_SITE_OTHER): Payer: 59 | Admitting: Family Medicine

## 2015-11-02 VITALS — BP 104/72 | HR 71 | Temp 98.0°F | Ht 60.0 in | Wt 122.8 lb

## 2015-11-02 DIAGNOSIS — R0789 Other chest pain: Secondary | ICD-10-CM | POA: Diagnosis not present

## 2015-11-02 DIAGNOSIS — Z0001 Encounter for general adult medical examination with abnormal findings: Secondary | ICD-10-CM | POA: Diagnosis not present

## 2015-11-02 DIAGNOSIS — Z23 Encounter for immunization: Secondary | ICD-10-CM

## 2015-11-02 NOTE — Progress Notes (Signed)
Pre visit review using our clinic review tool, if applicable. No additional management support is needed unless otherwise documented below in the visit note. 

## 2015-11-02 NOTE — Patient Instructions (Addendum)
We will update the EKG next year just to have a baseline but I have little concern that this is cardiac in nature. Sorry about our internet situation  A multivitamin with b12 and vitamin D is very reasonable but I doubt it will give you the energy back for caring from 2 children under age 34!   Health Maintenance Due  Topic Date Due  . PAP SMEAR - sign release of information at check out desk so we can get a copy of this 09/01/2002  . INFLUENZA VACCINE - thanks for getting this today 08/11/2015

## 2015-11-03 ENCOUNTER — Encounter: Payer: Self-pay | Admitting: Family Medicine

## 2015-11-03 NOTE — Progress Notes (Signed)
Phone: [phone]  Subjective:  Patient presents today for their annual physical. Chief complaint-noted.   See problem oriented charting- ROS- full  review of systems was completed and negative except for: about once a month mild chest pain- see below  The following were reviewed and entered/updated in epic: Past Medical History:  Diagnosis Date  . 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 Georgette Dover 2015- was thought to be scar tissue- no growth since that time   . Chicken pox   . Generalized headaches    Has had some migraines in past as well. once a week or less. Usually occipital and associated with sinus pressure as well.   Marland Kitchen Heartburn in pregnancy    Patient Active Problem List   Diagnosis Date Noted  . Generalized headaches    Past Surgical History:  Procedure Laterality Date  . CESAREAN SECTION  07/2010   [facility]  . CESAREAN SECTION N/A 12/27/2012   womens 2nd   . WISDOM TOOTH EXTRACTION      Family History  Problem Relation Age of Onset  . Hypertension Mother   . Diabetes Father     Medications- reviewed and updated Current Outpatient Prescriptions  Medication Sig Dispense Refill  . norethindrone-ethinyl estradiol-iron (ESTROSTEP FE,TILIA FE,TRI-LEGEST FE) 1-20/1-30/1-35 MG-MCG tablet Take 1 tablet by mouth daily.     No current facility-administered medications for this visit.     Allergies-reviewed and updated Allergies  Allergen Reactions  . Amoxil [Amoxicillin] Rash    Social History   Social History  . Marital status: Married    Spouse name: N/A  . Number of children: N/A  . Years of education: N/A   Social History Main Topics  . Smoking status: Never Smoker  . Smokeless tobacco: Never Used  . Alcohol use No  . Drug use: No  . Sexual activity: Yes    Birth control/ protection: None     Comment: pregnant   Other Topics Concern  . 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 Oropharynx normal Neck: no thyromegaly CV: RRR no murmurs rubs or gallops Lungs: CTAB no crackles, wheeze, rhonchi Abdomen: soft/nontender/nondistended/normal bowel sounds. No rebound or guarding.  Ext: no edema Skin: warm, dry Neuro: grossly normal, moves all extremities, PERRLA  Assessment/Plan:  35 y.o. female presenting for annual physical.  Health Maintenance counseling: 1. Anticipatory guidance: Patient counseled regarding regular dental exams, eye exams (wears contacts), wearing seatbelts.  2. Risk factor reduction:  Advised patient of need for regular exercise and diet rich and fruits and vegetables to reduce risk of heart attack and stroke. . She does very well with 4-5 days a week of exercise and rather healthy diet.  3. Immunizations/screenings/ancillary studies Immunization History  Administered Date(s) Administered  . Influenza,inj,Quad PF,36+ Mos 09/26/2014, 11/02/2015  . Tdap 10/10/2012   Health Maintenance Due  Topic Date Due  . PAP SMEAR - gets yearly after abnormal in 2013 leading to colopscopy with no findings at time 09/01/2002   4. Cervical cancer screening- getting records. [facility] Dr[facility] 5. Breast cancer screening-  breast exam considering starting age 55 with GYN and mammogram possible baseline at 38- to discuss with GYN 6. Colon cancer screening - no family history start age 40 7. Skin cancer screening- no obvious  precancerous or cancerous lesions on chaperoned skin exam today  Regular period on birth control. Active with husband only and no STD concerns  Status of chronic or acute concerns  Likely stress related vs MSK chest pain- Patient continues to get about once a month left sided aching in chest that is worse with movement such as twisting but not worse with exertion. She is able to do her 5 days a week exercise without ever having issue. Also no shortness of breath, diaphoresis, left arm or neck pain, nausea. No family history early MI or CAD in general. Usually sharp pain lasting a few seconds of few minutes at latest. We planned to get a baseline EKG just to have on file (no high concern for cardiac cause but internet down today and discussed doing this next year if continues but gave sooner return precautions as well.   Return in about 1 year (around 11/01/2016) for physical.  Orders Placed This Encounter  Procedures  . Flu Vaccine QUAD 36+ mos IM   Return precautions advised.   Garret Reddish, MD

## 2015-12-30 ENCOUNTER — Encounter: Payer: Self-pay | Admitting: *Deleted

## 2015-12-30 ENCOUNTER — Emergency Department
Admission: EM | Admit: 2015-12-30 | Discharge: 2015-12-30 | Disposition: A | Discharge status: Home / Self Care | Payer: 59 | Source: Home / Self Care | Attending: Family Medicine | Admitting: Family Medicine

## 2015-12-30 DIAGNOSIS — J020 Streptococcal pharyngitis: Secondary | ICD-10-CM | POA: Diagnosis not present

## 2015-12-30 LAB — POCT RAPID STREP A (OFFICE): Rapid Strep A Screen: POSITIVE — AB

## 2015-12-30 MED ORDER — CEFDINIR 300 MG PO CAPS: 300.0000 mg | ORAL_CAPSULE | Freq: Two times a day (BID) | ORAL | 0 refills | Status: DC

## 2015-12-30 NOTE — ED Triage Notes (Signed)
Pt c/o sore throat, neck is tender to touch, and chills x 1 day. Temp 99.5.

## 2015-12-30 NOTE — ED Provider Notes (Signed)
CSN: [id number]     Arrival date & time 12/30/15  [id number] History   None    Chief Complaint  Patient presents with  . Sore Throat   (Consider location/radiation/quality/duration/timing/severity/associated sxs/prior Treatment) HPI  Sydney Herman is a 35 y.o. female presenting to UC with c/o sore throat and tender neck with chills since yesterday.  Throat pain is mild to moderate in severity, worse with swallowing.  Temp of 99.5*F in triage.  Pt notes her children have had some congestion but have not c/o sore throat.  Denies n/v/d.    Past Medical History:  Diagnosis Date  . 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 Georgette Dover 2015- was thought to be scar tissue- no growth since that time   . Chicken pox   . Generalized headaches    Has had some migraines in past as well. once a week or less. Usually occipital and associated with sinus pressure as well.   Marland Kitchen Heartburn in pregnancy    Past Surgical History:  Procedure Laterality Date  . CESAREAN SECTION  07/2010   [facility]  . CESAREAN SECTION N/A 12/27/2012   womens 2nd   . WISDOM TOOTH EXTRACTION     Family History  Problem Relation Age of Onset  . Hypertension Mother   . Diabetes Father    Social History  Substance Use Topics  . Smoking status: Never Smoker  . Smokeless tobacco: Never Used  . Alcohol use No   OB History    Gravida Para Term Preterm AB Living   2 2 2     2    SAB TAB Ectopic Multiple Live Births           2     Review of Systems  Constitutional: Positive for chills. Negative for fever.  HENT: Positive for rhinorrhea (minimal) and sore throat. Negative for congestion, ear pain, trouble swallowing and voice change.   Respiratory: Negative for cough and shortness of breath.   Cardiovascular: Negative for chest pain and palpitations.  Gastrointestinal: Negative for abdominal pain, diarrhea, nausea and vomiting.  Musculoskeletal: Negative for arthralgias, back pain and myalgias.  Skin:  Negative for rash.    Allergies  Amoxil [amoxicillin]  Home Medications   Prior to Admission medications   Medication Sig Start Date End Date Taking? Authorizing Provider  cefdinir (OMNICEF) 300 MG capsule Take 1 capsule (300 mg total) by mouth 2 (two) times daily. For 7 days 12/30/15   Noland Fordyce, PA-C  norethindrone-ethinyl estradiol-iron (ESTROSTEP FE,TILIA FE,TRI-LEGEST FE) 1-20/1-30/1-35 MG-MCG tablet Take 1 tablet by mouth daily.    Historical Provider, MD   Meds Ordered and Administered this Visit  Medications - No data to display  BP 105/62 (BP Location: Left Arm)   Pulse 91   Temp 99.5 F (37.5 C) (Oral)   Resp 16   Ht 5' (1.524 m)   Wt 121 lb (54.9 kg)   LMP 12/14/2015   SpO2 100%   BMI 23.63 kg/m  No data found.   Physical Exam  Constitutional: She appears well-developed and well-nourished. No distress.  HENT:  Head: Normocephalic and atraumatic.  Right Ear: Tympanic membrane normal.  Left Ear: Tympanic membrane normal.  Nose: Nose normal.  Mouth/Throat: Uvula is midline and mucous membranes are normal. Posterior oropharyngeal edema and posterior oropharyngeal erythema present. No oropharyngeal exudate or tonsillar abscesses.  Eyes: Conjunctivae are normal. No scleral icterus.  Neck: Normal range of motion. Neck supple.  Cardiovascular: Normal rate,  regular rhythm and normal heart sounds.   Pulmonary/Chest: Effort normal and breath sounds normal. No stridor. No respiratory distress. She has no wheezes. She has no rales.  Abdominal: Soft. She exhibits no distension. There is no tenderness.  Musculoskeletal: Normal range of motion.  Lymphadenopathy:    She has cervical adenopathy.  Neurological: She is alert.  Skin: Skin is warm and dry. She is not diaphoretic.  Nursing note and vitals reviewed.   Urgent Care Course   Clinical Course     Procedures (including critical care time)  Labs Review Labs Reviewed  POCT RAPID STREP A (OFFICE) - Abnormal;  Notable for the following:       Result Value   Rapid Strep A Screen Positive (*)    All other components within normal limits    Imaging Review No results found.   MDM   1. Strep throat    Pt c/o sore throat and chills that started yesterday.  Rapid strep: POSITIVE  Rx: Cefdinir (pt gets rash with Amoxicillin)  Home care instructions provided. F/u with PCP in 1 week if not improving, sooner if worsening.    Noland Fordyce, PA-C 12/30/15 1101

## 2016-02-26 DIAGNOSIS — Z01419 Encounter for gynecological examination (general) (routine) without abnormal findings: Secondary | ICD-10-CM | POA: Diagnosis not present

## 2016-02-26 DIAGNOSIS — Z6823 Body mass index (BMI) 23.0-23.9, adult: Secondary | ICD-10-CM | POA: Diagnosis not present

## 2016-02-26 LAB — HM PAP SMEAR: HM Pap smear: NORMAL

## 2016-04-28 MED FILL — DESOGESTR-ETH ESTRAD ETH ES: 0.15-0.02/0 | 84 days supply | Qty: 84 | Fill #0

## 2016-07-18 MED FILL — VIORELE 28 DAY TABLET: 0.15-0.02/0 | 84 days supply | Qty: 84 | Fill #1

## 2016-08-24 MED FILL — KARIVA 28 DAY TABLET: 0.15-0.02/0 | 84 days supply | Qty: 84 | Fill #2

## 2016-11-02 ENCOUNTER — Encounter: Payer: Self-pay | Admitting: Family Medicine

## 2016-11-02 ENCOUNTER — Telehealth: Payer: Self-pay | Admitting: Family Medicine

## 2016-11-02 ENCOUNTER — Ambulatory Visit (INDEPENDENT_AMBULATORY_CARE_PROVIDER_SITE_OTHER): Payer: 59 | Admitting: Family Medicine

## 2016-11-02 VITALS — BP 100/66 | HR 59 | Temp 97.7°F | Ht 60.0 in | Wt 122.0 lb

## 2016-11-02 DIAGNOSIS — Z23 Encounter for immunization: Secondary | ICD-10-CM | POA: Diagnosis not present

## 2016-11-02 DIAGNOSIS — R519 Headache, unspecified: Secondary | ICD-10-CM

## 2016-11-02 DIAGNOSIS — E785 Hyperlipidemia, unspecified: Secondary | ICD-10-CM

## 2016-11-02 DIAGNOSIS — K5900 Constipation, unspecified: Secondary | ICD-10-CM

## 2016-11-02 DIAGNOSIS — R51 Headache: Secondary | ICD-10-CM

## 2016-11-02 DIAGNOSIS — Z0000 Encounter for general adult medical examination without abnormal findings: Secondary | ICD-10-CM | POA: Diagnosis not present

## 2016-11-02 LAB — LIPID PANEL
CHOL/HDL RATIO: 2
CHOLESTEROL: 217 mg/dL — AB (ref 0–200)
HDL: 95.8 mg/dL (ref >39.00)
LDL CALC: 105 mg/dL — AB (ref 0–99)
NonHDL: 121.14
Triglycerides: 82 mg/dL (ref 0.0–149.0)
VLDL: 16.4 mg/dL (ref 0.0–40.0)

## 2016-11-02 LAB — COMPREHENSIVE METABOLIC PANEL
ALK PHOS: 30 U/L — AB (ref 39–117)
ALT: 14 U/L (ref 0–35)
AST: 22 U/L (ref 0–37)
Albumin: 4.2 g/dL (ref 3.5–5.2)
BUN: 15 mg/dL (ref 6–23)
CO2: 27 mEq/L (ref 19–32)
Calcium: 9.5 mg/dL (ref 8.4–10.5)
Chloride: 102 mEq/L (ref 96–112)
Creatinine, Ser: 0.83 mg/dL (ref 0.40–1.20)
GFR: 83.07 mL/min (ref >60.00)
GLUCOSE: 87 mg/dL (ref 70–99)
Potassium: 4.5 mEq/L (ref 3.5–5.1)
Sodium: 137 mEq/L (ref 135–145)
TOTAL PROTEIN: 7.8 g/dL (ref 6.0–8.3)
Total Bilirubin: 0.5 mg/dL (ref 0.2–1.2)

## 2016-11-02 LAB — CBC
HCT: 39.2 % (ref 36.0–46.0)
Hemoglobin: 13.1 g/dL (ref 12.0–15.0)
MCHC: 33.6 g/dL (ref 30.0–36.0)
MCV: 90.8 fl (ref 78.0–100.0)
Platelets: 232 10*3/uL (ref 150.0–400.0)
RBC: 4.31 Mil/uL (ref 3.87–5.11)
RDW: 13.3 % (ref 11.5–15.5)
WBC: 6.3 10*3/uL (ref 4.0–10.5)

## 2016-11-02 LAB — TSH: TSH: 0.62 u[IU]/mL (ref 0.35–4.50)

## 2016-11-02 NOTE — Progress Notes (Signed)
Phone: [phone]  Subjective:  Patient presents today for their annual physical. Chief complaint-noted.   See problem oriented charting- ROS- full  review of systems was completed and negative except for: fatigue, constipation, weekly headaches  The following were reviewed and entered/updated in epic: Past Medical History:  Diagnosis Date  . 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 Georgette Dover 2015- was thought to be scar tissue- no growth since that time   . Chicken pox   . Generalized headaches    Has had some migraines in past as well. once a week or less. Usually occipital and associated with sinus pressure as well.   Marland Kitchen Heartburn in pregnancy    Patient Active Problem List   Diagnosis Date Noted  . Generalized headaches    Past Surgical History:  Procedure Laterality Date  . CESAREAN SECTION  07/2010   [facility]  . CESAREAN SECTION N/A 12/27/2012   womens 2nd   . WISDOM TOOTH EXTRACTION      Family History  Problem Relation Age of Onset  . Hypertension Mother   . Diabetes Father   . Atrial fibrillation Father   . Other Father        biopsies on kidney- noncancerous. stated potentially precancerous    Medications- reviewed and updated Current Outpatient Prescriptions  Medication Sig Dispense Refill  . Lactobacillus (PROBIOTIC ACIDOPHILUS PO) Take 1 tablet by mouth daily.    . Multiple Vitamins-Minerals (WOMENS MULTIVITAMIN PO) Take 1 tablet by mouth daily.    . norethindrone-ethinyl estradiol-iron (ESTROSTEP FE,TILIA FE,TRI-LEGEST FE) 1-20/1-30/1-35 MG-MCG tablet Take 1 tablet by mouth daily.     No current facility-administered medications for this visit.     Allergies-reviewed and updated Allergies  Allergen Reactions  . Amoxil [Amoxicillin] Rash    Social History   Social History  . Marital status: Married    Spouse name: N/A  . Number of children: N/A  . Years of education: N/A   Social History Main Topics  . Smoking  status: Never Smoker  . Smokeless tobacco: Never Used  . Alcohol use No  . Drug use: No  . Sexual activity: Yes    Birth control/ protection: Pill   Other Topics Concern  . 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 Oropharynx normal Neck: no thyromegaly CV: RRR no murmurs rubs or gallops Lungs: CTAB no crackles, wheeze, rhonchi Abdomen: soft/nontender/nondistended/normal bowel sounds. No rebound or guarding.  Ext: no edema Skin: warm, dry Neuro: CN II-XII intact, sensation and reflexes normal throughout, 5/5 muscle strength in bilateral upper and lower extremities. Normal finger to nose. Normal rapid alternating movements. No pronator drift. Normal romberg.  Normal gait.   Assessment/Plan:  36 y.o. female presenting for annual physical.  Health Maintenance counseling: 1. Anticipatory guidance: Patient counseled regarding regular dental exams -q6 months, eye exams -yearly eye exam, wearing seatbelts.  2. Risk factor reduction:  Advised patient of need for regular exercise and diet rich and fruits and vegetables to reduce risk of heart attack and stroke. Honestly doing really well with this. Some constipation- doing probiotic. This month was particularly bad- miralax 2-3 days. 60 oz of water a day. Exercise the first thing to go in schedule- still  doing 2-3x a week minimum. Salad for lunch and veggie for dinner. Not many fruits- discussed adding to see if that helps Wt Readings from Last 3 Encounters:  11/02/16 122 lb (55.3 kg)  12/30/15 121 lb (54.9 kg)  11/02/15 122 lb 12.8 oz (55.7 kg)  3. Immunizations/screenings/ancillary studies- advised flu shot given young children in home- given today.  Immunization History  Administered Date(s) Administered  . Influenza,inj,Quad PF,6+ Mos 09/26/2014, 11/02/2015  . Tdap 10/10/2012  4. Cervical cancer screening- Sees Dr. Orvan Seen- colposcopy in past years ago- did not get records last year 44. Breast cancer screening-  [facility] and [facility]ogram decisions per them- discussed possibly getting baseline this year. She likely wants to start at 67 and do annually.  6. Colon cancer screening - no family history of colon cancer- no pencil thin stools, start at age 60-50 7. Skin cancer screening- advised regular sunscreen use. Denies worrisome, changing, or new skin lesions.  8. Family planning- on birth control oral contraceptives. Has 2 kids 41 and almost 4. Husband planning on vasectomy for long term plan once health improves.   Status of chronic or acute concerns  With constipation and mild HLD- update TSH  Update labs with very mild HLD in past- not at point would need medication  Generalized headaches From last year- history usually once a week or less- usually occipital and has sinus pressure. Has had migraines in the past.  Today-Recently has had to take ibuprofen 1-2 at a time as headaches slightly worse. Occasionally feels nauseous. Slight increase to 1-2x a week from weekly. Ibuprofen usually helps. Often happens on Monday which is a stressful day/businest day  Discussed likely tension headaches- discussed using 1 ibuprofen early on in headache instead of waiting as long as 1-2x a week max  1 year CPE  Orders Placed This Encounter  Procedures  . CBC    [place]    . Comprehensive metabolic panel    [place]    Order Specific Question:   Has the patient fasted?    Answer:   No  . TSH    [place]  . Lipid panel    [place]    Order Specific Question:   Has the patient fasted?    Answer:   No   Very light creamer this AM  Meds ordered this encounter  Medications  . Multiple Vitamins-Minerals (WOMENS MULTIVITAMIN PO)    Sig: Take 1 tablet by mouth daily.  . Lactobacillus (PROBIOTIC ACIDOPHILUS PO)    Sig: Take 1 tablet by mouth daily.   Return precautions advised.  Garret Reddish, MD

## 2016-11-02 NOTE — Telephone Encounter (Signed)
I called [facility] on behalf of Dr. Yong Channel to request the last pap smear results/records for the patient. I left a voicemail with the medical records department with [facility] requesting the records and advising that the patient signed a release of information in 2016 however, the records were never sent over. I asked for the department to call to advise.

## 2016-11-02 NOTE — Patient Instructions (Signed)
You are doing great on food choice/exercise front! Keep it up  Likely tension headaches- try the ibuprofen earlier on before they get bad and medicine is usually more effective earlier.

## 2016-11-02 NOTE — Addendum Note (Signed)
Addended by: Lucianne Lei M on: 11/02/2016 10:34 AM   Modules accepted: Orders

## 2016-11-02 NOTE — Assessment & Plan Note (Signed)
From last year- history usually once a week or less- usually occipital and has sinus pressure. Has had migraines in the past.  Today-Recently has had to take ibuprofen 1-2 at a time as headaches slightly worse. Occasionally feels nauseous. Slight increase to 1-2x a week from weekly. Ibuprofen usually helps. Often happens on Monday which is a stressful day/businest day  Discussed likely tension headaches- discussed using 1 ibuprofen early on in headache instead of waiting as long as 1-2x a week max

## 2016-11-03 NOTE — Telephone Encounter (Signed)
Thank you Kara!  

## 2016-11-04 ENCOUNTER — Encounter: Payer: Self-pay | Admitting: Family Medicine

## 2016-11-08 ENCOUNTER — Encounter: Payer: Self-pay | Admitting: Family Medicine

## 2016-11-15 MED FILL — KARIVA 28 DAY TABLET: 0.15-0.02/0 | 84 days supply | Qty: 84 | Fill #3

## 2016-12-30 ENCOUNTER — Telehealth: Payer: 59 | Admitting: Family

## 2016-12-30 ENCOUNTER — Telehealth: Payer: Self-pay | Admitting: Radiology

## 2016-12-30 DIAGNOSIS — B9689 Other specified bacterial agents as the cause of diseases classified elsewhere: Secondary | ICD-10-CM

## 2016-12-30 DIAGNOSIS — J028 Acute pharyngitis due to other specified organisms: Secondary | ICD-10-CM

## 2016-12-30 MED ORDER — AZITHROMYCIN 250 MG PO TABS: ORAL_TABLET | ORAL | 0 refills | Status: DC

## 2016-12-30 MED ORDER — PREDNISONE 5 MG PO TABS: 5.0000 mg | ORAL_TABLET | ORAL | 0 refills | Status: DC

## 2016-12-30 MED ORDER — BENZONATATE 100 MG PO CAPS: 100.0000 mg | ORAL_CAPSULE | Freq: Three times a day (TID) | ORAL | 0 refills | Status: DC | PRN

## 2016-12-30 MED FILL — predniSONE 5 MG (21) TBPK: 5 | 6 days supply | Qty: 21 | Fill #0

## 2016-12-30 MED FILL — AZITHROMYCIN 250 MG TAB: 250 | 5 days supply | Qty: 6 | Fill #0

## 2016-12-30 MED FILL — BENZONATATE 100 MG CAP: 100 | 5 days supply | Qty: 30 | Fill #0

## 2016-12-30 NOTE — Telephone Encounter (Signed)
Copied from [place]. Topic: General - Other >> Dec 30, 2016  1:41 PM Patrice Paradise wrote: Reason for CRM: Patient is requesting a call back from Dr. Yong Channel assistant. Reason for the call patient is having some sinus issues and would like to know what they recommend

## 2016-12-30 NOTE — Progress Notes (Signed)
Thank you for the details you included in the comment boxes. Those details are very helpful in determining the best course of treatment for you and help [place] to provide the best care. It could be a lingering bacterial infection and/or residual inflammation from a viral or bacterial infection. Typically, these issues clear up 7-10 days after the original infection at which point we may just give low-dose prednisone. However, sometimes the body is able to fight the infection down but not kill it completely, so I will give you antibiotics as well given the severe length of illness.   We are sorry that you are not feeling well.  Here is how we plan to help!  Based on your presentation I believe you most likely have A cough due to bacteria.  When patients have a fever and a productive cough with a change in color or increased sputum production, we are concerned about bacterial bronchitis.  If left untreated it can progress to pneumonia.  If your symptoms do not improve with your treatment plan it is important that you contact your provider.   I have prescribed Azithromyin 250 mg: two tablets now and then one tablet daily for 4 additonal days    In addition you may use A non-prescription cough medication called Mucinex DM: take 2 tablets every 12 hours. and A prescription cough medication called Tessalon Perles 100mg . You may take 1-2 capsules every 8 hours as needed for your cough.  Sterapred 5 mg dosepak  From your responses in the eVisit questionnaire you describe inflammation in the upper respiratory tract which is causing a significant cough.  This is commonly called Bronchitis and has four common causes:    Allergies  Viral Infections  Acid Reflux  Bacterial Infection Allergies, viruses and acid reflux are treated by controlling symptoms or eliminating the cause. An example might be a cough caused by taking certain blood pressure medications. You stop the cough by changing the medication. Another  example might be a cough caused by acid reflux. Controlling the reflux helps control the cough.  USE OF BRONCHODILATOR ("RESCUE") INHALERS: There is a risk from using your bronchodilator too frequently.  The risk is that over-reliance on a medication which only relaxes the muscles surrounding the breathing tubes can reduce the effectiveness of medications prescribed to reduce swelling and congestion of the tubes themselves.  Although you feel brief relief from the bronchodilator inhaler, your asthma may actually be worsening with the tubes becoming more swollen and filled with mucus.  This can delay other crucial treatments, such as oral steroid medications. If you need to use a bronchodilator inhaler daily, several times per day, you should discuss this with your provider.  There are probably better treatments that could be used to keep your asthma under control.     HOME CARE . Only take medications as instructed by your medical team. . Complete the entire course of an antibiotic. . Drink plenty of fluids and get plenty of rest. . Avoid close contacts especially the very young and the elderly . Cover your mouth if you cough or cough into your sleeve. . Always remember to wash your hands . A steam or ultrasonic humidifier can help congestion.   GET HELP RIGHT AWAY IF: . You develop worsening fever. . You become short of breath . You cough up blood. . Your symptoms persist after you have completed your treatment plan MAKE SURE YOU   Understand these instructions.  Will watch your condition.  Will  get help right away if you are not doing well or get worse.  Your e-visit answers were reviewed by a board certified advanced clinical practitioner to complete your personal care plan.  Depending on the condition, your plan could have included both over the counter or prescription medications. If there is a problem please reply  once you have received a response from your provider. Your safety  is important to [place].  If you have drug allergies check your prescription carefully.    You can use MyChart to ask questions about today's visit, request a non-urgent call back, or ask for a work or school excuse for 24 hours related to this e-Visit. If it has been greater than 24 hours you will need to follow up with your provider, or enter a new e-Visit to address those concerns. You will get an e-mail in the next two days asking about your experience.  I hope that your e-visit has been valuable and will speed your recovery. Thank you for using e-visits.

## 2017-01-04 NOTE — Telephone Encounter (Signed)
Called and left a voicemail message asking patient to either call the office or send a My Chart message as to how she is feeling and if she would like to schedule an appointment.

## 2017-02-06 MED FILL — KARIVA 28 DAY TABLET: 0.15-0.02/0 | 56 days supply | Qty: 56 | Fill #4

## 2017-02-17 ENCOUNTER — Ambulatory Visit: Payer: 59 | Admitting: Family Medicine

## 2017-02-17 NOTE — Progress Notes (Signed)
Sydney Herman is a 37 y.o. female here for a new problem.  Winfred Burn, RN acting as Education administrator.   History of Present Illness:   Chief Complaint  Patient presents with  . Fatigue  . Bloated   HPI   Patient presents today with multiple complaints. Her main concerns are fatigue, bloating, and irritability. For the past two months she has noticed these symptoms. Has two small children at home, a 24.5 y/o and 54 y/o, of which she reports that she is the main child care provider for. She is happily married and her husband is a great support system for her. She does not have any family nearby. 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 She does openly endorse that she has been having feelings of "lack of motivation" with her job and other activities recently. Eats all food groups, relatively healthy diet with balanced meals/snacks, watches protein/CHO/fat ratios. Has been feeling bloated, occasional constipation. Avoids excessive dairy because that has caused worsening bloating lately. Does not endorse that she notices any additional bloating linked to other foods.  She does endorse occasional constipation. She is on a probiotic that she's been on at least a couple years. Reports difficulty with staying awake at the end of a long day, and is having difficulty concentrating. Denies dangerous thoughts or thoughts of self-harm.  She reports that she drinks 1 cup of coffee daily. Occasional 2nd cup during the week. No sodas or energy drinks. On average getting 8 hours of sleep per night. No increase in bleeding with period. Has had some increased cramping with period. Does see an Ob-Gyn, seeing her next week for annual exam -- sees Dr. Orvan Se[facility] at [facility]. Patient's last menstrual period was 02/06/2017. She is on oral contraceptives. She denies any recent skipped pills. She denies possibility of pregnancy.  Exercises 4-5 days a week, she does barre classes. Drinks wine, has  recently tried to cut back. Even at her highest intake she was drinking 3-4 glasses of wine a week, maximum. After further discussion, patient reports that she's been internalizing a lot of anxiety. Her mother was recently diagnosed with cancer in November, and she is in [place]. She reports that her mother is starting chemotherapy. She is tearful while discussing this.  Denies chest pain, SOB, poor appetite, palpitations.   GAD 7 : Generalized Anxiety Score 02/20/2017  Nervous, Anxious, on Edge 1  Control/stop worrying 1  Worry too much - different things 2  Trouble relaxing 0  Restless 0  Easily annoyed or irritable 2  Afraid - awful might happen 0  Total GAD 7 Score 6   Depression screen [facility] 2/9 02/20/2017 11/02/2016  Decreased Interest 1 0  Down, Depressed, Hopeless 1 0  PHQ - 2 Score 2 0  Altered sleeping 2 -  Tired, decreased energy 3 -  Change in appetite 1 -  Feeling bad or failure about yourself  0 -  Trouble concentrating 2 -  Moving slowly or fidgety/restless 0 -  Suicidal thoughts 0 -  PHQ-9 Score 10 -      Past Medical History:  Diagnosis Date  . 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 Georgette Dover 2015- was thought to be scar tissue- no growth since that time   . Chicken pox   . Generalized headaches    Has had some migraines in past as well. once a week or less. Usually occipital and associated with sinus pressure  as well.   Marland Kitchen Heartburn in pregnancy      Social History   Socioeconomic History  . Marital status: Married    Spouse name: Not on file  . Number of children: Not on file  . Years of education: Not on file  . Highest education level: Not on file  Social Needs  . Financial resource strain: Not on file  . Food insecurity - worry: Not on file  . Food insecurity - inability: Not on file  . Transportation needs - medical: Not on file  . Transportation needs - non-medical: Not on file  Occupational History  . Not on file  Tobacco  Use  . Smoking status: Never Smoker  . Smokeless tobacco: Never Used  Substance and Sexual Activity  . Alcohol use: No  . Drug use: No  . Sexual activity: Yes    Birth control/protection: Pill  Other Topics Concern  . 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 CESAREAN SECTION  07/2010   [facility]  . CESAREAN SECTION N/A 12/27/2012   womens 2nd   . WISDOM TOOTH EXTRACTION      Family History  Problem Relation Age of Onset  . Hypertension Mother   . Diabetes Father   . Atrial fibrillation Father   . Other Father        biopsies on kidney- noncancerous. stated potentially precancerous    Allergies  Allergen Reactions  . Amoxil [Amoxicillin] Rash    Current Medications:   Current Outpatient Medications:  .  LACTOBACILLUS PO, Take by mouth., Disp: , Rfl:  .  Multiple Vitamins-Minerals (WOMENS MULTIVITAMIN PO), Take 1 tablet by mouth daily., Disp: , Rfl:  .  norethindrone-ethinyl estradiol-iron (ESTROSTEP FE,TILIA FE,TRI-LEGEST FE) 1-20/1-30/1-35 MG-MCG tablet, Take 1 tablet by mouth daily., Disp: , Rfl:    Review of Systems:   ROS  Negative unless otherwise specified per HPI.  Vitals:   Vitals:   02/20/17 1358  BP: 110/70  Pulse: 70  Resp: 16  Temp: 98.4 F (36.9 C)  TempSrc: Oral  Weight: 127 lb 9.6 oz (57.9 kg)  Height: 5' (1.524 m)     Body mass index is 24.92 kg/m.  Physical Exam:   Physical Exam  Constitutional: She appears well-developed. She is cooperative.  Non-toxic appearance. She does not have a sickly appearance. She does not appear ill.  No distress.  Cardiovascular: Normal rate, regular rhythm, S1 normal, S2 normal, normal heart sounds and normal pulses.  No LE edema  Pulmonary/Chest: Effort normal and breath sounds normal.  Abdominal: Normal appearance and bowel sounds are normal. There is no tenderness. There is no rigidity, no rebound and no guarding.  Neurological: She is alert. GCS eye subscore is 4. GCS verbal subscore is 5. GCS motor subscore is 6.  Skin: Skin is warm, dry and intact.  Psychiatric: She has a normal mood and affect. Her speech is normal and behavior is normal.  Nursing note and vitals reviewed.   Assessment and  Plan:    Sydney Herman was seen today for fatigue and bloated.  Diagnoses and all orders for this visit:  Fatigue, unspecified type; Bloating; Irritability Exam benign. PHQ-9 is 10 and GAD-7 is 6 today. Suspect symptoms are secondary to anxiety and depression, however will rule out organic cause with labs, see below. Discussed options of seeing therapist, recommended Trey Paula, in our office. Also discussed with her consideration of SSRI, which she will think about. Provided emotional support, and encouraged self-care as schedule/life allows. I discussed with patient that if they develop any SI, to tell someone immediately and seek medical attention. I recommended that she follow-up in 1 month, sooner if needed, to follow-up on these issues. I recommended continuing probiotic, healthy diet and exercise as well. She is seeing Ob-Gyn next week for physical exam and I encouraged her to discuss these issues with her as well, patient is in agreement. -     TSH -     Comprehensive metabolic panel -     Vitamin B12 -     VITAMIN D 25 Hydroxy (Vit-D Deficiency, Fractures) -     CBC with Differential/Platelet  . Reviewed expectations re: course of current medical issues. . Discussed self-management of symptoms. . Outlined signs and symptoms indicating need for more acute intervention. . Patient  verbalized understanding and all questions were answered. . See orders for this visit as documented in the electronic medical record. . Patient received an After-Visit Summary.  CMA or LPN served as scribe during this visit. History, Physical, and Plan performed by medical provider. Documentation and orders reviewed and attested to.  Inda Coke, PA-C

## 2017-02-20 ENCOUNTER — Encounter: Payer: Self-pay | Admitting: Physician Assistant

## 2017-02-20 ENCOUNTER — Ambulatory Visit: Payer: 59 | Admitting: Physician Assistant

## 2017-02-20 VITALS — BP 110/70 | HR 70 | Temp 98.4°F | Resp 16 | Ht 60.0 in | Wt 127.6 lb

## 2017-02-20 DIAGNOSIS — R454 Irritability and anger: Secondary | ICD-10-CM

## 2017-02-20 DIAGNOSIS — R5383 Other fatigue: Secondary | ICD-10-CM

## 2017-02-20 DIAGNOSIS — R140 Abdominal distension (gaseous): Secondary | ICD-10-CM | POA: Diagnosis not present

## 2017-02-20 DIAGNOSIS — E538 Deficiency of other specified B group vitamins: Secondary | ICD-10-CM | POA: Insufficient documentation

## 2017-02-20 NOTE — Patient Instructions (Signed)
It was great to see you!  We will call you with your lab results.  Please consider making an appointment with Trey Paula our therapist.  If labs are normal and you would like to consider medication, we could trial Lexapro or Zoloft, please let me know. If you even had dangerous thoughts, please let someone know and go to the ER.  Talk to ob-gyn about these things.

## 2017-02-21 LAB — COMPREHENSIVE METABOLIC PANEL
ALT: 13 U/L (ref 0–35)
AST: 20 U/L (ref 0–37)
Albumin: 4.2 g/dL (ref 3.5–5.2)
Alkaline Phosphatase: 40 U/L (ref 39–117)
BUN: 17 mg/dL (ref 6–23)
CALCIUM: 9.4 mg/dL (ref 8.4–10.5)
CHLORIDE: 102 meq/L (ref 96–112)
CO2: 29 meq/L (ref 19–32)
Creatinine, Ser: 1.01 mg/dL (ref 0.40–1.20)
GFR: 66.11 mL/min (ref >60.00)
Glucose, Bld: 86 mg/dL (ref 70–99)
POTASSIUM: 4.4 meq/L (ref 3.5–5.1)
Sodium: 138 mEq/L (ref 135–145)
Total Bilirubin: 0.5 mg/dL (ref 0.2–1.2)
Total Protein: 7.6 g/dL (ref 6.0–8.3)

## 2017-02-21 LAB — CBC WITH DIFFERENTIAL/PLATELET
Basophils Absolute: 26 cells/uL (ref 0–200)
Basophils Relative: 0.3 %
EOS PCT: 0.5 %
Eosinophils Absolute: 43 cells/uL (ref 15–500)
HCT: 36.7 % (ref 35.0–45.0)
HEMOGLOBIN: 12.7 g/dL (ref 11.7–15.5)
Lymphs Abs: 2675 cells/uL (ref 850–3900)
MCH: 29.7 pg (ref 27.0–33.0)
MCHC: 34.6 g/dL (ref 32.0–36.0)
MCV: 85.9 fL (ref 80.0–100.0)
MONOS PCT: 6.2 %
MPV: 10.3 fL (ref 7.5–12.5)
NEUTROS ABS: 5323 {cells}/uL (ref 1500–7800)
Neutrophils Relative %: 61.9 %
Platelets: 308 10*3/uL (ref 140–400)
RBC: 4.27 10*6/uL (ref 3.80–5.10)
RDW: 12.5 % (ref 11.0–15.0)
Total Lymphocyte: 31.1 %
WBC mixed population: 533 cells/uL (ref 200–950)
WBC: 8.6 10*3/uL (ref 3.8–10.8)

## 2017-02-21 LAB — VITAMIN B12: VITAMIN B 12: 253 pg/mL (ref 211–911)

## 2017-02-21 LAB — TSH: TSH: 0.79 u[IU]/mL (ref 0.35–4.50)

## 2017-02-21 LAB — VITAMIN D 25 HYDROXY (VIT D DEFICIENCY, FRACTURES): VITD: 54.1 ng/mL (ref 30.00–100.00)

## 2017-02-23 ENCOUNTER — Ambulatory Visit (INDEPENDENT_AMBULATORY_CARE_PROVIDER_SITE_OTHER): Payer: 59 | Admitting: *Deleted

## 2017-02-23 DIAGNOSIS — E538 Deficiency of other specified B group vitamins: Secondary | ICD-10-CM

## 2017-02-23 MED ORDER — CYANOCOBALAMIN 1000 MCG/ML IJ SOLN
1000.0000 ug | Freq: Once | INTRAMUSCULAR | Status: AC
  Administered 2017-02-23: 1000 ug via INTRAMUSCULAR

## 2017-02-23 NOTE — Progress Notes (Signed)
Pt presented to office for B12 Injection. Pt tolerated well. Pt told to return in one week for next injection. Pt verbalized understanding and said she already has appointments scheduled.

## 2017-02-23 NOTE — Progress Notes (Signed)
I have reviewed and agree with note, evaluation, plan.   Edward Guthmiller, MD  

## 2017-02-28 DIAGNOSIS — Z01419 Encounter for gynecological examination (general) (routine) without abnormal findings: Secondary | ICD-10-CM | POA: Diagnosis not present

## 2017-02-28 DIAGNOSIS — Z6824 Body mass index (BMI) 24.0-24.9, adult: Secondary | ICD-10-CM | POA: Diagnosis not present

## 2017-02-28 MED FILL — BLISOVI 24 FE TABLET: 1-20 | 84 days supply | Qty: 84 | Fill #0

## 2017-03-02 ENCOUNTER — Ambulatory Visit (INDEPENDENT_AMBULATORY_CARE_PROVIDER_SITE_OTHER): Payer: 59 | Admitting: *Deleted

## 2017-03-02 ENCOUNTER — Encounter: Payer: Self-pay | Admitting: *Deleted

## 2017-03-02 DIAGNOSIS — E538 Deficiency of other specified B group vitamins: Secondary | ICD-10-CM

## 2017-03-02 MED ORDER — CYANOCOBALAMIN 1000 MCG/ML IJ SOLN
1000.0000 ug | Freq: Once | INTRAMUSCULAR | Status: AC
  Administered 2017-03-02: 1000 ug via INTRAMUSCULAR

## 2017-03-02 NOTE — Progress Notes (Signed)
Pt presented to office for weekly B12 Injections. Pt tolerated well and knows to return in one week for next injection.

## 2017-03-02 NOTE — Progress Notes (Signed)
I have reviewed and agree with note, evaluation, plan.   Kieryn Burtis, MD  

## 2017-03-09 ENCOUNTER — Ambulatory Visit (INDEPENDENT_AMBULATORY_CARE_PROVIDER_SITE_OTHER): Payer: 59

## 2017-03-09 DIAGNOSIS — E538 Deficiency of other specified B group vitamins: Secondary | ICD-10-CM | POA: Diagnosis not present

## 2017-03-09 MED ORDER — CYANOCOBALAMIN 1000 MCG/ML IJ SOLN
1000.0000 ug | Freq: Once | INTRAMUSCULAR | Status: AC
  Administered 2017-03-09: 1000 ug via INTRAMUSCULAR

## 2017-03-09 NOTE — Progress Notes (Signed)
Patient in for B12 injection today due to B12 deficiency.  Administered in left arm. Patient tolerated well.

## 2017-03-15 ENCOUNTER — Ambulatory Visit (INDEPENDENT_AMBULATORY_CARE_PROVIDER_SITE_OTHER): Payer: 59

## 2017-03-15 DIAGNOSIS — E538 Deficiency of other specified B group vitamins: Secondary | ICD-10-CM | POA: Diagnosis not present

## 2017-03-15 MED ORDER — CYANOCOBALAMIN 1000 MCG/ML IJ SOLN
1000.0000 ug | Freq: Once | INTRAMUSCULAR | Status: AC
  Administered 2017-03-15: 1000 ug via INTRAMUSCULAR

## 2017-03-15 NOTE — Progress Notes (Signed)
I have reviewed the patient's encounter and agree with the documentation.  Sydney Herman. Jerline Pain, MD 03/15/2017 11:59 AM

## 2017-03-15 NOTE — Progress Notes (Signed)
Patient in today for B12 injection due to B12 deficiency. Tolerated well in right arm.

## 2017-03-21 ENCOUNTER — Ambulatory Visit (INDEPENDENT_AMBULATORY_CARE_PROVIDER_SITE_OTHER): Payer: 59 | Admitting: Physician Assistant

## 2017-03-21 ENCOUNTER — Encounter: Payer: Self-pay | Admitting: Physician Assistant

## 2017-03-21 VITALS — BP 112/82 | HR 67 | Temp 98.3°F | Ht 60.0 in | Wt 126.5 lb

## 2017-03-21 DIAGNOSIS — F341 Dysthymic disorder: Secondary | ICD-10-CM | POA: Diagnosis not present

## 2017-03-21 DIAGNOSIS — F419 Anxiety disorder, unspecified: Secondary | ICD-10-CM | POA: Diagnosis not present

## 2017-03-21 DIAGNOSIS — E538 Deficiency of other specified B group vitamins: Secondary | ICD-10-CM | POA: Diagnosis not present

## 2017-03-21 MED ORDER — SERTRALINE HCL 25 MG PO TABS: 25.0000 mg | ORAL_TABLET | Freq: Every day | ORAL | 1 refills | Status: DC

## 2017-03-21 MED FILL — SERTRALINE HCL 25 MG TABS: 25 | 30 days supply | Qty: 30 | Fill #0

## 2017-03-21 NOTE — Progress Notes (Signed)
Sydney Herman is a 37 y.o. female is here to follow up on Fatigue.  I acted as a Education administrator for [employer], PA-C Sydney Pickler, LPN  History of Present Illness:   Chief Complaint  Patient presents with  . Fatigue    Pt states she is here today to follow up on fatigue. Pt was started on Vit B12 injections on 2/14, was doing weekly x 4 weeks and now monthly. Pt says she has felt better in the past week little more energy and does not feel like she is going to pass out. Denies lightheadedness and dizziness.   Is still interested in meeting with Sydney Herman, has yet to reach out to her.  Bloating is still occurring. She has been thinking about starting zoloft or lexapro for her mood. She is concerned about potential side effects, such as nausea. She has a friend that's on Pristiq and had significant nausea with it and she is worried about having to deal with this.  GAD 7 : Generalized Anxiety Score 02/20/2017  Nervous, Anxious, on Edge 1  Control/stop worrying 1  Worry too much - different things 2  Trouble relaxing 0  Restless 0  Easily annoyed or irritable 2  Afraid - awful might happen 0  Total GAD 7 Score 6   Depression screen Sydney Herman 2/9 02/20/2017 11/02/2016  Decreased Interest 1 0  Down, Depressed, Hopeless 1 0  PHQ - 2 Score 2 0  Altered sleeping 2 -  Tired, decreased energy 3 -  Change in appetite 1 -  Feeling bad or failure about yourself  0 -  Trouble concentrating 2 -  Moving slowly or fidgety/restless 0 -  Suicidal thoughts 0 -  PHQ-9 Score 10 -      There are no preventive care reminders to display for this patient.  Past Medical History:  Diagnosis Date  . 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 Georgette Dover 2015- was thought to be scar tissue- no growth since that time   . Chicken pox   . Generalized headaches    Has had some migraines in past as well. once a week or less. Usually occipital and associated with sinus pressure as well.     Marland Kitchen Heartburn in pregnancy      Social History   Socioeconomic History  . Marital status: Married    Spouse name: Not on file  . Number of children: Not on file  . Years of education: Not on file  . Highest education level: Not on file  Social Needs  . Financial resource strain: Not on file  . Food insecurity - worry: Not on file  . Food insecurity - inability: Not on file  . Transportation needs - medical: Not on file  . Transportation needs - non-medical: Not on file  Occupational History  . Not on file  Tobacco Use  . Smoking status: Never Smoker  . Smokeless tobacco: Never Used  Substance and Sexual Activity  . Alcohol use: No  . Drug use: No  . Sexual activity: Yes    Birth control/protection: Pill  Other Topics Concern  . 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 CESAREAN SECTION  07/2010   [facility]  . CESAREAN SECTION N/A 12/27/2012   womens 2nd   . WISDOM TOOTH EXTRACTION      Family History  Problem Relation Age of Onset  . Hypertension Mother   . Diabetes Father   . Atrial fibrillation Father   . Other Father        biopsies on kidney- noncancerous. stated potentially precancerous    PMHx, SurgHx, SocialHx, FamHx, Medications, and Allergies were reviewed in the Visit Navigator and updated as appropriate.   Patient Active Problem List   Diagnosis Date Noted  . Vitamin B12 deficiency 02/20/2017  . Generalized headaches     Social History   Tobacco Use  . Smoking status: Never Smoker  . Smokeless tobacco: Never Used  Substance Use Topics  .  Alcohol use: No  . Drug use: No    Current Medications and Allergies:    Current Outpatient Medications:  Marland Kitchen  KARIVA 0.15-0.02/0.01 MG (21/5) tablet, , Disp: , Rfl: 4 .  Multiple Vitamins-Minerals (WOMENS MULTIVITAMIN PO), Take 1 tablet by mouth daily., Disp: , Rfl:  .  Probiotic Product (PROBIOTIC DAILY PO), Take 1 tablet by mouth daily., Disp: , Rfl:  .  norethindrone-ethinyl estradiol-iron (ESTROSTEP FE,TILIA FE,TRI-LEGEST FE) 1-20/1-30/1-35 MG-MCG tablet, Take 1 tablet by mouth daily., Disp: , Rfl:    Allergies  Allergen Reactions  . Amoxil [Amoxicillin] Rash    Review of Systems   ROS  Vitals:   Vitals:   03/21/17 1114  BP: 112/82  Pulse: 67  Temp: 98.3 F (36.8 C)  TempSrc: Oral  SpO2: 99%  Weight: 126 lb 8 oz (57.4 kg)  Height: 5' (1.524 m)     Body mass index is 24.71 kg/m.   Physical Exam:    Physical Exam  Constitutional: She appears well-developed. She is cooperative.  Non-toxic appearance. She does not have a sickly appearance. She does not appear ill. No distress.  Cardiovascular: Normal rate, regular rhythm, S1 normal, S2 normal, normal heart sounds and normal pulses.  No LE edema  Pulmonary/Chest: Effort normal and breath sounds normal.  Neurological: She is alert. GCS eye subscore is 4. GCS verbal subscore is 5. GCS motor subscore is 6.  Skin: Skin is warm, dry and intact.  Psychiatric: She has a normal mood and affect. Her speech is normal and behavior is normal.  Nursing note and vitals reviewed.    Assessment and Plan:    Sydney Herman was seen today for fatigue.  Diagnoses and all orders for this visit:  Vitamin B12 deficiency She is doing well with the injections, currently on monthly injections for the next 3 months. Continue.  Anxiety and Dysthymia We had a long discussion of medication options and side effects. She is contemplating Zoloft 25 mg. I told her that I would send this in for her to start and if she decides to do so, she is to  follow-up with our office approx 4-6 weeks after starting this medication, sooner if needed. We reviewed black box warnings for SSRIs. I discussed with patient that if they develop any SI, to tell someone immediately and seek medical attention. I have also given her Sydney Herman' number for her to call and discuss setting up an appointment for talk therapy with her. Patient  verbalized understanding and is agreeable to plan.  . Reviewed expectations re: course of current medical issues. . Discussed self-management of symptoms. . Outlined signs and symptoms indicating need for more acute intervention. . Patient verbalized understanding and all questions were answered. . See orders for this visit as documented in the electronic medical record. . Patient received an After Visit Summary.  CMA or LPN served as scribe during this visit. History, Physical, and Plan performed by medical provider. Documentation and orders reviewed and attested to.  Inda Coke, PA-C [place], [place] 03/21/2017  Follow-up: No Follow-up on file.

## 2017-04-18 ENCOUNTER — Ambulatory Visit (INDEPENDENT_AMBULATORY_CARE_PROVIDER_SITE_OTHER): Payer: 59

## 2017-04-18 DIAGNOSIS — E538 Deficiency of other specified B group vitamins: Secondary | ICD-10-CM

## 2017-04-18 MED ORDER — CYANOCOBALAMIN 1000 MCG/ML IJ SOLN
1000.0000 ug | Freq: Once | INTRAMUSCULAR | Status: AC
  Administered 2017-04-18: 1000 ug via INTRAMUSCULAR

## 2017-04-18 NOTE — Progress Notes (Signed)
Patient received vitamin B12 1000 mg IM in right deltoid.  Tolerated without difficulty.  Will schedule next B12 injection for 1 month from now.

## 2017-05-03 ENCOUNTER — Encounter: Payer: Self-pay | Admitting: Physician Assistant

## 2017-05-14 ENCOUNTER — Telehealth: Payer: 59 | Admitting: Physician Assistant

## 2017-05-14 DIAGNOSIS — R300 Dysuria: Secondary | ICD-10-CM | POA: Diagnosis not present

## 2017-05-14 MED ORDER — NITROFURANTOIN MONOHYD MACRO 100 MG PO CAPS: 100.0000 mg | ORAL_CAPSULE | Freq: Two times a day (BID) | ORAL | 0 refills | Status: DC

## 2017-05-14 NOTE — Progress Notes (Signed)

## 2017-05-19 ENCOUNTER — Ambulatory Visit: Payer: 59

## 2017-05-23 ENCOUNTER — Ambulatory Visit (INDEPENDENT_AMBULATORY_CARE_PROVIDER_SITE_OTHER): Payer: 59

## 2017-05-23 DIAGNOSIS — E538 Deficiency of other specified B group vitamins: Secondary | ICD-10-CM

## 2017-05-23 MED ORDER — CYANOCOBALAMIN 1000 MCG/ML IJ SOLN
1000.0000 ug | Freq: Once | INTRAMUSCULAR | Status: AC
  Administered 2017-05-23: 1000 ug via INTRAMUSCULAR

## 2017-05-23 NOTE — Progress Notes (Signed)
Patient received vitamin B12 1000 mcg IM in left deltoid.  Tolerated without difficulty.  Will schedule appointment for another B12 injection in 1 month.

## 2017-06-22 MED FILL — BLISOVI 24 FE TABLET: 1-20 | 84 days supply | Qty: 84 | Fill #1

## 2017-06-23 ENCOUNTER — Ambulatory Visit (INDEPENDENT_AMBULATORY_CARE_PROVIDER_SITE_OTHER): Payer: 59

## 2017-06-23 DIAGNOSIS — E538 Deficiency of other specified B group vitamins: Secondary | ICD-10-CM | POA: Diagnosis not present

## 2017-06-23 MED ORDER — CYANOCOBALAMIN 1000 MCG/ML IJ SOLN
1000.0000 ug | Freq: Once | INTRAMUSCULAR | Status: AC
  Administered 2017-06-23: 1000 ug via INTRAMUSCULAR

## 2017-06-23 NOTE — Progress Notes (Signed)
Patient in this morning for B12 injection. Administered in right arm. Patient tolerated well.

## 2017-08-04 DIAGNOSIS — H5213 Myopia, bilateral: Secondary | ICD-10-CM | POA: Diagnosis not present

## 2017-09-18 MED FILL — BLISOVI 24 FE TABLET: 1-20 | 84 days supply | Qty: 84 | Fill #2

## 2017-10-25 ENCOUNTER — Encounter: Payer: Self-pay | Admitting: Family Medicine

## 2017-10-25 ENCOUNTER — Ambulatory Visit (INDEPENDENT_AMBULATORY_CARE_PROVIDER_SITE_OTHER): Payer: 59

## 2017-10-25 DIAGNOSIS — Z23 Encounter for immunization: Secondary | ICD-10-CM | POA: Diagnosis not present

## 2017-11-01 NOTE — Patient Instructions (Addendum)
1.  Please stop by lab before you go if your fasting 2.  Thanks for doing your flu shot today 3. Consider counseling for stress management

## 2017-11-01 NOTE — Progress Notes (Signed)
Phone: [phone]  Subjective:  Patient presents today for their annual physical. Chief complaint-noted.   See problem oriented charting- ROS- full  review of systems was completed and negative except for: mild Issues with constipation and intermittent headaches, some anxiety  The following were reviewed and entered/updated in epic: Past Medical History:  Diagnosis Date  . 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 Georgette Dover 2015- was thought to be scar tissue- no growth since that time   . Chicken pox   . Generalized headaches    Has had some migraines in past as well. once a week or less. Usually occipital and associated with sinus pressure as well.   Marland Kitchen Heartburn in pregnancy    Patient Active Problem List   Diagnosis Date Noted  . Mild hyperlipidemia 11/02/2017  . Vitamin B12 deficiency 02/20/2017  . Generalized headaches    Past Surgical History:  Procedure Laterality Date  . CESAREAN SECTION  07/2010   [facility]  . CESAREAN SECTION N/A 12/27/2012   womens 2nd   . WISDOM TOOTH EXTRACTION      Family History  Problem Relation Age of Onset  . Hypertension Mother   . Diabetes Father   . Atrial fibrillation Father   . Other Father        biopsies on kidney- noncancerous. stated potentially precancerous    Medications- reviewed and updated Current Outpatient Medications  Medication Sig Dispense Refill  . Multiple Vitamins-Minerals (WOMENS MULTIVITAMIN PO) Take 1 tablet by mouth daily.    . norethindrone-ethinyl estradiol-iron (ESTROSTEP FE,TILIA FE,TRI-LEGEST FE) 1-20/1-30/1-35 MG-MCG tablet Take 1 tablet by mouth daily.     No current facility-administered medications for this visit.     Allergies-reviewed and updated Allergies  Allergen Reactions  . 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 Oropharynx normal Neck: no thyromegaly CV: RRR no murmurs rubs or gallops Lungs: CTAB no crackles, wheeze, rhonchi Abdomen: soft/nontender/nondistended/normal bowel sounds. No rebound or guarding.  Ext: no edema Skin: warm, dry Neuro: grossly normal, moves all extremities, PERRLA  Assessment/Plan:  37 y.o. female presenting for annual physical.  Health Maintenance counseling: 1. Anticipatory guidance: Patient counseled regarding regular dental exams - had some issues earlier this year- multiple cavity spots- goes q6 months, eye exams - yearly for contacts, wearing seatbelts.  2. Risk factor reduction:  Advised patient of need for regular exercise and diet rich and fruits and vegetables to reduce risk of heart attack and stroke. Exercise-remains very active with exercise at least 2 to 3 days a week- goal 3-4 . Diet-reasonably healthy diet.  Wt Readings from Last 3 Encounters:  11/02/17 126 lb 12.8  oz (57.5 kg)  03/21/17 126 lb 8 oz (57.4 kg)  02/20/17 127 lb 9.6 oz (57.9 kg)  3. Immunizations/screenings/ancillary studies-flu shot today  Immunization History  Administered Date(s) Administered  . Influenza,inj,Quad PF,6+ Mos 09/26/2014, 11/02/2015, 11/02/2016, 10/25/2017  . Tdap 10/10/2012  4. Cervical cancer screening- follows with Dr. Atkins-colposcopy in the past years ago.  We have records of Pap from 2018 5. Breast cancer screening-  breast exam with physicians for  women and mammogram-she plans to start currently due to family history in mom  6. Colon cancer screening -  No family history.  Likely start age 1-50 7. Skin cancer screening-no dermatologist. advised regular sunscreen use. Denies worrisome, changing, or new skin lesions.  8. Birth control/STD check- on oral contraceptives.  Has been planning on vasectomy as of last year- still needs to get that done .  She has no concerns about STDs  Status of chronic or acute concerns   Earlier this year- house on market (ended up not moving)- trying to buy house, mom diagnosed with breast cancer- was a lot all at once. Had low normal b12 and did injections for these. Felt like she hit a wall/breaking point- is in a much better place at this point. States gets overwhelmed and anxious easily she never took the zoloft -PHQ2 down to 0 at present.  - discussed investing in herself- consider counseling - she wants to remain off zoloft  Intermittent issues with constipation- last year was doing reasonably well with diet and exercise.  No exercise was down some to 2-3 times a week.  She was getting some fiber with salad for lunch and vegetables for dinner.  She has been to try to add fruit.  Was using probiotic last year-doing better right now- not sure why- will monitor   Generalized headaches From 2 years ago- "history usually once a week or less- usually occipital and has sinus pressure. Has had migraines in the past. " From 1 year ago"Recently has had to take ibuprofen 1-2 at a time as headaches slightly worse. Occasionally feels nauseous. Slight increase to 1-2x a week from weekly. Ibuprofen usually helps. Often happens on Monday which is a stressful day/businest day  Discussed likely tension headaches- discussed using 1 ibuprofen early on in headache instead of waiting as long as 1-2x a week max" Today-  Reports have been doing well recently, a few times a month- much more sporadic- ibuprofen still  working  Mild hyperlipidemia Mild hyperlipidemia in 2017 and 2018-update labs. Would not consider medication unless LDL was over 190 and hasnt been close to that  Return in about 1 year (around 11/03/2018) for physical.  Lab/Order associations: FASTING Preventative health care - Plan: CBC, Comprehensive metabolic panel, Lipid panel, TSH  Hyperlipidemia, unspecified hyperlipidemia type - Plan: CBC, Comprehensive metabolic panel, Lipid panel, TSH  Fatigue, unspecified type - Plan: Vitamin B12  Generalized headaches  Return precautions advised.  Garret Reddish, MD

## 2017-11-02 ENCOUNTER — Encounter: Payer: Self-pay | Admitting: Family Medicine

## 2017-11-02 ENCOUNTER — Ambulatory Visit (INDEPENDENT_AMBULATORY_CARE_PROVIDER_SITE_OTHER): Payer: 59 | Admitting: Family Medicine

## 2017-11-02 VITALS — BP 110/72 | HR 65 | Temp 98.4°F | Ht 60.0 in | Wt 126.8 lb

## 2017-11-02 DIAGNOSIS — Z0000 Encounter for general adult medical examination without abnormal findings: Secondary | ICD-10-CM

## 2017-11-02 DIAGNOSIS — R519 Headache, unspecified: Secondary | ICD-10-CM

## 2017-11-02 DIAGNOSIS — R51 Headache: Secondary | ICD-10-CM

## 2017-11-02 DIAGNOSIS — R5383 Other fatigue: Secondary | ICD-10-CM

## 2017-11-02 DIAGNOSIS — E785 Hyperlipidemia, unspecified: Secondary | ICD-10-CM | POA: Diagnosis not present

## 2017-11-02 LAB — COMPREHENSIVE METABOLIC PANEL
ALT: 14 U/L (ref 0–35)
AST: 18 U/L (ref 0–37)
Albumin: 4.4 g/dL (ref 3.5–5.2)
Alkaline Phosphatase: 38 U/L — ABNORMAL LOW (ref 39–117)
BUN: 16 mg/dL (ref 6–23)
CHLORIDE: 103 meq/L (ref 96–112)
CO2: 26 meq/L (ref 19–32)
Calcium: 9.4 mg/dL (ref 8.4–10.5)
Creatinine, Ser: 0.83 mg/dL (ref 0.40–1.20)
GFR: 82.59 mL/min (ref >60.00)
GLUCOSE: 85 mg/dL (ref 70–99)
POTASSIUM: 4.7 meq/L (ref 3.5–5.1)
SODIUM: 138 meq/L (ref 135–145)
Total Bilirubin: 0.5 mg/dL (ref 0.2–1.2)
Total Protein: 7.5 g/dL (ref 6.0–8.3)

## 2017-11-02 LAB — VITAMIN B12: Vitamin B-12: 249 pg/mL (ref 211–911)

## 2017-11-02 LAB — TSH: TSH: 0.48 u[IU]/mL (ref 0.35–4.50)

## 2017-11-02 LAB — LIPID PANEL
CHOL/HDL RATIO: 3
Cholesterol: 199 mg/dL (ref 0–200)
HDL: 75.9 mg/dL (ref >39.00)
LDL CALC: 107 mg/dL — AB (ref 0–99)
NONHDL: 122.62
Triglycerides: 80 mg/dL (ref 0.0–149.0)
VLDL: 16 mg/dL (ref 0.0–40.0)

## 2017-11-02 LAB — CBC
HEMATOCRIT: 39 % (ref 36.0–46.0)
HEMOGLOBIN: 13.2 g/dL (ref 12.0–15.0)
MCHC: 33.9 g/dL (ref 30.0–36.0)
MCV: 90.9 fl (ref 78.0–100.0)
PLATELETS: 252 10*3/uL (ref 150.0–400.0)
RBC: 4.29 Mil/uL (ref 3.87–5.11)
RDW: 13.2 % (ref 11.5–15.5)
WBC: 7.6 10*3/uL (ref 4.0–10.5)

## 2017-11-02 NOTE — Assessment & Plan Note (Signed)
Mild hyperlipidemia in 2017 and 2018-update labs. Would not consider medication unless LDL was over 190 and hasnt been close to that

## 2017-11-02 NOTE — Assessment & Plan Note (Signed)
From 2 years ago- "history usually once a week or less- usually occipital and has sinus pressure. Has had migraines in the past. " From 1 year ago"Recently has had to take ibuprofen 1-2 at a time as headaches slightly worse. Occasionally feels nauseous. Slight increase to 1-2x a week from weekly. Ibuprofen usually helps. Often happens on Monday which is a stressful day/businest day  Discussed likely tension headaches- discussed using 1 ibuprofen early on in headache instead of waiting as long as 1-2x a week max" Today-  Reports have been doing well recently, a few times a month- much more sporadic- ibuprofen still working

## 2017-11-02 NOTE — Addendum Note (Signed)
Addended by: Kayren Eaves T on: 11/02/2017 09:17 AM   Modules accepted: Orders

## 2017-11-02 NOTE — Progress Notes (Signed)
Your CBC was normal (blood counts, infection fighting cells, platelets). Your CMET was normal (kidney, liver, and electrolytes, blood sugar).  Your cholesterol remains mildly high for bad cholesterol but your good cholesterol is so good that I would not even consider medication. Your thyroid was normal.  Your B12 remains in low normal range- I would strongly suggest taking 1000 mcg of B12 daily on ongoing basis-we can repeat this test next year.  If you would prefer our team can set you up for 2 to 76-month repeat vitamin B12 test under low B12

## 2017-12-12 MED FILL — BLISOVI 24 FE TABLET: 1-20 | 84 days supply | Qty: 84 | Fill #3

## 2018-01-25 MED FILL — KARIVA 28 DAY TABLET: 0.15-0.02/0 | 28 days supply | Qty: 28 | Fill #0

## 2018-03-01 DIAGNOSIS — Z6824 Body mass index (BMI) 24.0-24.9, adult: Secondary | ICD-10-CM | POA: Diagnosis not present

## 2018-03-01 DIAGNOSIS — Z1231 Encounter for screening mammogram for malignant neoplasm of breast: Secondary | ICD-10-CM | POA: Diagnosis not present

## 2018-03-01 DIAGNOSIS — Z01419 Encounter for gynecological examination (general) (routine) without abnormal findings: Secondary | ICD-10-CM | POA: Diagnosis not present

## 2018-03-01 MED FILL — KARIVA 28 DAY TABLET: 0.15-0.02/0 | 84 days supply | Qty: 84 | Fill #0

## 2018-05-23 MED FILL — KARIVA 28 DAY TABLET: 0.15-0.02/0 | 84 days supply | Qty: 84 | Fill #1

## 2018-07-17 DIAGNOSIS — H5213 Myopia, bilateral: Secondary | ICD-10-CM | POA: Diagnosis not present

## 2018-08-17 MED FILL — KARIVA 28 DAY TABLET: 0.15-0.02/0 | 84 days supply | Qty: 84 | Fill #2

## 2018-08-29 ENCOUNTER — Ambulatory Visit (INDEPENDENT_AMBULATORY_CARE_PROVIDER_SITE_OTHER)
Admission: RE | Admit: 2018-08-29 | Discharge: 2018-08-29 | Disposition: A | Discharge status: Home / Self Care | Payer: 59 | Source: Ambulatory Visit

## 2018-08-29 DIAGNOSIS — J0100 Acute maxillary sinusitis, unspecified: Secondary | ICD-10-CM

## 2018-08-29 MED ORDER — CETIRIZINE-PSEUDOEPHEDRINE ER 5-120 MG PO TB12: 1.0000 | ORAL_TABLET | Freq: Every day | ORAL | 0 refills | Status: DC

## 2018-08-29 MED ORDER — AZITHROMYCIN 250 MG PO TABS: 250.0000 mg | ORAL_TABLET | Freq: Every day | ORAL | 0 refills | Status: DC

## 2018-08-29 MED ORDER — TRIAMCINOLONE ACETONIDE 55 MCG/ACT NA AERO: 2.0000 | INHALATION_SPRAY | Freq: Every day | NASAL | 12 refills | Status: DC

## 2018-08-29 MED FILL — TRIAMCINOLONE ACETONIDE 55: 55 | 30 days supply | Qty: 17 | Fill #0

## 2018-08-29 MED FILL — ZYRTEC-D TABLET: 5-120 | 24 days supply | Qty: 24 | Fill #0

## 2018-08-29 NOTE — ED Provider Notes (Signed)
Virtual Visit via Video Note:  [name]  initiated request for Telemedicine visit with [facility] team. I connected with [name]  on 08/29/2018 at 8:28 AM  for a synchronized telemedicine visit using a video enabled HIPPA compliant telemedicine application. I verified that I am speaking with [name]  using two identifiers. Orvan July, NP  was physically located in a [facility] Urgent care site and MAYERLI KIRST was located at a different location.   The limitations of evaluation and management by telemedicine as well as the availability of in-person appointments were discussed. Patient was informed that she  may incur a bill ( including co-pay) for this virtual visit encounter. Lavren Lewan Fitzhenry  expressed understanding and gave verbal consent to proceed with virtual visit.     History of Present Illness:Sydney Herman  is a 38 y.o. female presents with approximately 3 to 4 days of sinus congestion, facial pressure, rhinorrhea.  Symptoms have been constant.  She has been using Claritin and ibuprofen for symptoms.  She has not gotten much relief with this.  Patient does not like using nasal sprays. History of sinus infections.  Denies any associated fever, cough, body aches, chills or night sweats.  Denies any COVID exposures or recent sick exposures.  Past Medical History:  Diagnosis Date  . 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 Georgette Dover 2015- was thought to be scar tissue- no growth since that time   . Chicken pox   . Generalized headaches    Has had some migraines in past as well. once a week or less. Usually occipital and associated with sinus pressure as well.   Marland Kitchen Heartburn in pregnancy     Allergies  Allergen Reactions  . Amoxil [Amoxicillin] Rash        Observations/Objective:VITALS: Per patient if applicable, see vitals. GENERAL: Alert, appears well and in no acute distress. HEENT: Atraumatic, conjunctiva clear, no obvious  abnormalities on inspection of external nose and ears.  Patient sounds congested.  Pressure with self palpation of sinuses NECK: Normal movements of the head and neck. CARDIOPULMONARY: No increased WOB. Speaking in clear sentences. I:E ratio WNL.  MS: Moves all visible extremities without noticeable abnormality. PSYCH: Pleasant and cooperative, well-groomed. Speech normal rate and rhythm. Affect is appropriate. Insight and judgement are appropriate. Attention is focused, linear, and appropriate.  NEURO: CN grossly intact. Oriented as arrived to appointment on time with no prompting. Moves both UE equally.  SKIN: No obvious lesions, wounds, erythema, or cyanosis noted on face or hands.     Assessment and Plan: Treating with Zyrtec-D and Nasacort.  Recommended trying this for the next couple days.  If not seeing any improvement or worsening over the next 3-4 days she can go ahead and start the antibiotics at that time.  Sent in Z-Pak to start later in the week if not better   Follow Up Instructions: Follow up as needed for continued or worsening symptoms     I discussed the assessment and treatment plan with the patient. The patient was provided an opportunity to ask questions and all were answered. The patient agreed with the plan and demonstrated an understanding of the instructions.   The patient was advised to call back or seek an in-person evaluation if the symptoms worsen or if the condition fails to improve as anticipated.     Orvan July, NP  08/29/2018 8:28 AM  Orvan July, NP 08/29/18 [phone]

## 2018-08-29 NOTE — Discharge Instructions (Signed)
Try using Zyrtec-D and Nasacort as prescribed. If your symptoms have not improved in the next 4 to 5 days you go ahead and start the antibiotics. Follow up as needed for continued or worsening symptoms

## 2018-09-25 ENCOUNTER — Ambulatory Visit (INDEPENDENT_AMBULATORY_CARE_PROVIDER_SITE_OTHER): Payer: 59

## 2018-09-25 ENCOUNTER — Other Ambulatory Visit: Payer: Self-pay

## 2018-09-25 DIAGNOSIS — Z23 Encounter for immunization: Secondary | ICD-10-CM | POA: Diagnosis not present

## 2018-11-06 ENCOUNTER — Encounter: Payer: 59 | Admitting: Family Medicine

## 2018-11-12 MED FILL — KARIVA 28 DAY TABLET: 0.15-0.02/0 | 84 days supply | Qty: 84 | Fill #3

## 2018-11-16 ENCOUNTER — Encounter: Payer: Self-pay | Admitting: Osteopathic Medicine

## 2018-11-16 ENCOUNTER — Ambulatory Visit (INDEPENDENT_AMBULATORY_CARE_PROVIDER_SITE_OTHER): Payer: 59 | Admitting: Osteopathic Medicine

## 2018-11-16 ENCOUNTER — Other Ambulatory Visit: Payer: Self-pay

## 2018-11-16 VITALS — BP 120/80 | HR 67 | Temp 97.6°F | Ht 60.0 in | Wt 128.0 lb

## 2018-11-16 DIAGNOSIS — Z833 Family history of diabetes mellitus: Secondary | ICD-10-CM

## 2018-11-16 DIAGNOSIS — Z0000 Encounter for general adult medical examination without abnormal findings: Secondary | ICD-10-CM | POA: Diagnosis not present

## 2018-11-16 DIAGNOSIS — R0981 Nasal congestion: Secondary | ICD-10-CM | POA: Diagnosis not present

## 2018-11-16 DIAGNOSIS — Z803 Family history of malignant neoplasm of breast: Secondary | ICD-10-CM

## 2018-11-16 DIAGNOSIS — Z8742 Personal history of other diseases of the female genital tract: Secondary | ICD-10-CM | POA: Diagnosis not present

## 2018-11-16 DIAGNOSIS — Z3041 Encounter for surveillance of contraceptive pills: Secondary | ICD-10-CM | POA: Diagnosis not present

## 2018-11-16 DIAGNOSIS — E785 Hyperlipidemia, unspecified: Secondary | ICD-10-CM

## 2018-11-16 NOTE — Patient Instructions (Addendum)
General Preventive Care  Most recent routine screening lipids/other labs: ordered  Everyone should have blood pressure checked once per year.   Tobacco: don't!   Alcohol: responsible moderation is ok for most adults - if you have concerns about your alcohol intake, please talk to me!   Exercise: as tolerated to reduce risk of cardiovascular disease and diabetes. Strength training will also prevent osteoporosis.   Mental health: if need for mental health care (medicines, counseling, other), or concerns about moods, please let me know!   Sexual health: if need for STD testing, or if concerns with libido/pain problems, please let me know! If you need to discuss your birth control options, please let me know!   Advanced Directive: Living Will and/or Healthcare Power of Attorney recommended for all adults, regardless of age or health.  Vaccines  Flu vaccine: recommended for almost everyone, every fall.   Tetanus booster: Tdap recommended every 10 years.  Cancer screenings   Colon cancer screening: recommended for everyone at age 62, but some folks need a colonoscopy sooner if risk factors   Breast cancer screening: per OBGYN  Cervical cancer screening: Pap per OBGYN   Lung cancer screening: not needed for non-smokers Infection screenings . HIV: recommended screening at least once age 33-65, more often as needed. . Gonorrhea/Chlamydia: screening as needed . Hepatitis C: recommended for anyone born 80-1964 . TB: certain at-risk populations, or depending on work requirements and/or travel history Other . Bone Density Test: recommended for women at age 58

## 2018-11-16 NOTE — Progress Notes (Signed)
HPI: Sydney Herman is a 38 y.o. 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  she presents to [facility] today, 11/16/18,  for chief complaint of: Annual physical   Pleasant new patient to establish care. I see her husband, Sydney Herman! They have 2 kids, 52 and 6 currently. 43 year old is getting to tennis, which Sydney Herman enjoys playing with her.   Some anxiety issues, ongoing for awhile more days than not. Considering medications but isn't sure.      Past medical, surgical, social and family history reviewed:  Patient Active Problem List   Diagnosis Date Noted  . History of abnormal cervical Pap smear 11/16/2018  . Family history of type 2 diabetes mellitus in father 11/16/2018  . Family history of breast cancer in mother 11/16/2018  . Mild hyperlipidemia 11/02/2017  . Vitamin B12 deficiency 02/20/2017  . Generalized headaches     Past Surgical History:  Procedure Laterality Date  . CESAREAN SECTION  07/2010   [facility]  . CESAREAN SECTION N/A 12/27/2012   womens 2nd   . WISDOM TOOTH EXTRACTION      Social History   Tobacco Use  . Smoking status: Never Smoker  . Smokeless tobacco: Never Used  Substance Use Topics  . Alcohol use: Not Currently    Alcohol/week: 2.0 - 3.0 standard drinks    Types: 2 - 3 Standard drinks or equivalent per week    Family History  Problem Relation Age of Onset  . Hypertension Mother   . Breast cancer Mother        early 21's  . Diabetes Father   . Atrial fibrillation Father   . Other Father        biopsies on kidney- noncancerous. stated potentially precancerous  . Prostate cancer Maternal Grandfather      Current medication list and allergy/intolerance information reviewed:    Current Outpatient Medications  Medication Sig Dispense Refill  . CANNABIDIOL PO Take 10 mg by mouth.    .  COLLAGEN PO Take by mouth.    . desogestrel-ethinyl estradiol (KARIVA) 0.15-0.02/0.01 MG (21/5) tablet     . Multiple Vitamins-Minerals (WOMENS MULTIVITAMIN PO) Take 1 tablet by mouth daily.     No current facility-administered medications for this visit.     Allergies  Allergen Reactions  . Amoxil [Amoxicillin] Rash      Review of Systems:  Constitutional:  No  fever, no chills, No recent illness, No unintentional weight changes. +significant fatigue.   HEENT: No  headache, no vision change, no hearing change, No sore throat, +sinus pressure  Cardiac: No  chest pain, No  pressure, No palpitations, No  Orthopnea  Respiratory:  No  shortness of breath. No  Cough  Gastrointestinal: No  abdominal pain, No  nausea, No  vomiting,  No  blood in stool, No  diarrhea, No  constipation   Musculoskeletal: No new myalgia/arthralgia  Skin: No  Rash, No other wounds/concerning lesions  Genitourinary: No  incontinence, No  abnormal genital bleeding, No abnormal genital discharge  Hem/Onc: No  easy bruising/bleeding, No  abnormal lymph node  Endocrine: No cold intolerance,  No heat intolerance. No polyuria/polydipsia/polyphagia   Neurologic: No  weakness, No  dizziness, No  slurred speech/focal weakness/facial droop  Psychiatric: No  concerns with depression, +concerns with anxiety, No sleep problems, +mood swings   Depression screen [facility] 2/9  11/16/2018 11/02/2017 02/20/2017  Decreased Interest 1 0 1  Down, Depressed, Hopeless 1 0 1  PHQ - 2 Score 2 0 2  Altered sleeping - - 2  Tired, decreased energy - - 3  Change in appetite - - 1  Feeling bad or failure about yourself  - - 0  Trouble concentrating - - 2  Moving slowly or fidgety/restless - - 0  Suicidal thoughts - - 0  PHQ-9 Score - - 10   GAD 7 : Generalized Anxiety Score 02/20/2017  Nervous, Anxious, on Edge 1  Control/stop worrying 1  Worry too much - different things 2  Trouble relaxing 0  Restless 0  Easily annoyed or  irritable 2  Afraid - awful might happen 0  Total GAD 7 Score 6     Exam:  BP 120/80 (BP Location: Left Arm, Patient Position: Sitting, Cuff Size: Normal)   Pulse 67   Temp 97.6 F (36.4 C) (Oral)   Ht 5' (1.524 m)   Wt 128 lb 0.6 oz (58.1 kg)   BMI 25.01 kg/m   Constitutional: VS see above. General Appearance: alert, well-developed, well-nourished, NAD  Eyes: Normal lids and conjunctive, non-icteric sclera  Ears, Nose, Mouth, Throat: TM normal bilaterally.  Neck: No masses, trachea midline. No thyroid enlargement. No tenderness/mass appreciated. No lymphadenopathy  Respiratory: Normal respiratory effort. no wheeze, no rhonchi, no rales  Cardiovascular: S1/S2 normal, no murmur, no rub/gallop auscultated. RRR. No lower extremity edema.   Gastrointestinal: Nontender, no masses. No hepatomegaly, no splenomegaly. No hernia appreciated. Bowel sounds normal. Rectal exam deferred.   Musculoskeletal: Gait normal. No clubbing/cyanosis of digits.   Neurological: Normal balance/coordination. No tremor. No cranial nerve deficit on limited exam. Motor and sensation intact and symmetric. Cerebellar reflexes intact.   Skin: warm, dry, intact. No rash/ulcer. No concerning nevi or subq nodules on limited exam.    Psychiatric: Normal judgment/insight. Normal mood and affect. Oriented x3.    No results found for this or any previous visit (from the past 72 hour(s)).  No results found.     ASSESSMENT/PLAN: The primary encounter diagnosis was Annual physical exam. Diagnoses of Oral contraceptive use, Sinus congestion, History of abnormal cervical Pap smear, Family history of type 2 diabetes mellitus in father, Family history of breast cancer in mother, and Mild hyperlipidemia were also pertinent to this visit.   Orders Placed This Encounter  Procedures  . CBC  . COMPLETE METABOLIC PANEL WITH GFR  . LIPID SCREENING  . Vitamin B12    No orders of the defined types were placed in  this encounter.   Patient Instructions  General Preventive Care  Most recent routine screening lipids/other labs: ordered  Everyone should have blood pressure checked once per year.   Tobacco: don't!   Alcohol: responsible moderation is ok for most adults - if you have concerns about your alcohol intake, please talk to me!   Exercise: as tolerated to reduce risk of cardiovascular disease and diabetes. Strength training will also prevent osteoporosis.   Mental health: if need for mental health care (medicines, counseling, other), or concerns about moods, please let me know!   Sexual health: if need for STD testing, or if concerns with libido/pain problems, please let me know! If you need to discuss your birth control options, please let me know!   Advanced Directive: Living Will and/or Healthcare Power of Attorney recommended for all adults, regardless of age or health.  Vaccines  Flu vaccine: recommended for almost everyone, every  fall.   Tetanus booster: Tdap recommended every 10 years.  Cancer screenings   Colon cancer screening: recommended for everyone at age 65, but some folks need a colonoscopy sooner if risk factors   Breast cancer screening: per OBGYN  Cervical cancer screening: Pap per OBGYN   Lung cancer screening: not needed for non-smokers Infection screenings . HIV: recommended screening at least once age 21-65, more often as needed. . Gonorrhea/Chlamydia: screening as needed . Hepatitis C: recommended for anyone born 54-1964 . TB: certain at-risk populations, or depending on work requirements and/or travel history Other . Bone Density Test: recommended for women at age 37        Visit summary with medication list and pertinent instructions was printed for patient to review. All questions at time of visit were answered - patient instructed to contact office with any additional concerns or updates. ER/RTC precautions were reviewed with the patient.     Please note: voice recognition software was used to produce this document, and typos may escape review. Please contact Dr. Sheppard Coil for any needed clarifications.     Follow-up plan: Return in about 1 year (around 11/16/2019) for [place] (call week prior to visit for lab orders).

## 2018-11-29 DIAGNOSIS — Z0000 Encounter for general adult medical examination without abnormal findings: Secondary | ICD-10-CM | POA: Diagnosis not present

## 2018-11-29 DIAGNOSIS — Z8742 Personal history of other diseases of the female genital tract: Secondary | ICD-10-CM | POA: Diagnosis not present

## 2018-11-29 DIAGNOSIS — Z833 Family history of diabetes mellitus: Secondary | ICD-10-CM | POA: Diagnosis not present

## 2018-11-29 DIAGNOSIS — Z803 Family history of malignant neoplasm of breast: Secondary | ICD-10-CM | POA: Diagnosis not present

## 2018-11-29 DIAGNOSIS — Z3041 Encounter for surveillance of contraceptive pills: Secondary | ICD-10-CM | POA: Diagnosis not present

## 2018-11-29 DIAGNOSIS — E785 Hyperlipidemia, unspecified: Secondary | ICD-10-CM | POA: Diagnosis not present

## 2018-11-29 DIAGNOSIS — R0981 Nasal congestion: Secondary | ICD-10-CM | POA: Diagnosis not present

## 2018-11-30 LAB — COMPLETE METABOLIC PANEL WITH GFR
AG Ratio: 1.4 (calc) (ref 1.0–2.5)
ALT: 14 U/L (ref 6–29)
AST: 22 U/L (ref 10–30)
Albumin: 4.2 g/dL (ref 3.6–5.1)
Alkaline phosphatase (APISO): 39 U/L (ref 31–125)
BUN: 12 mg/dL (ref 7–25)
CO2: 23 mmol/L (ref 20–32)
Calcium: 9.3 mg/dL (ref 8.6–10.2)
Chloride: 102 mmol/L (ref 98–110)
Creat: 0.88 mg/dL (ref 0.50–1.10)
GFR, Est African American: 97 mL/min/{1.73_m2} (ref >=60)
GFR, Est Non African American: 84 mL/min/{1.73_m2} (ref >=60)
Globulin: 3.1 g/dL (calc) (ref 1.9–3.7)
Glucose, Bld: 90 mg/dL (ref 65–99)
Potassium: 4.4 mmol/L (ref 3.5–5.3)
Sodium: 136 mmol/L (ref 135–146)
Total Bilirubin: 0.4 mg/dL (ref 0.2–1.2)
Total Protein: 7.3 g/dL (ref 6.1–8.1)

## 2018-11-30 LAB — CBC
HCT: 39.7 % (ref 35.0–45.0)
Hemoglobin: 13.2 g/dL (ref 11.7–15.5)
MCH: 30.1 pg (ref 27.0–33.0)
MCHC: 33.2 g/dL (ref 32.0–36.0)
MCV: 90.4 fL (ref 80.0–100.0)
MPV: 10.5 fL (ref 7.5–12.5)
Platelets: 257 10*3/uL (ref 140–400)
RBC: 4.39 10*6/uL (ref 3.80–5.10)
RDW: 12.3 % (ref 11.0–15.0)
WBC: 6.4 10*3/uL (ref 3.8–10.8)

## 2018-11-30 LAB — LIPID PANEL
Cholesterol: 215 mg/dL — ABNORMAL HIGH (ref <200)
HDL: 99 mg/dL (ref >=50)
LDL Cholesterol (Calc): 97 mg/dL (calc)
Non-HDL Cholesterol (Calc): 116 mg/dL (calc) (ref <130)
Total CHOL/HDL Ratio: 2.2 (calc) (ref <5.0)
Triglycerides: 93 mg/dL (ref <150)

## 2018-11-30 LAB — VITAMIN B12: Vitamin B-12: 380 pg/mL (ref 200–1100)

## 2019-01-24 ENCOUNTER — Ambulatory Visit (INDEPENDENT_AMBULATORY_CARE_PROVIDER_SITE_OTHER): Payer: 59 | Admitting: Nurse Practitioner

## 2019-01-24 ENCOUNTER — Other Ambulatory Visit: Payer: Self-pay

## 2019-01-24 ENCOUNTER — Encounter: Payer: Self-pay | Admitting: Nurse Practitioner

## 2019-01-24 VITALS — BP 132/83 | HR 76 | Temp 97.8°F | Resp 12 | Ht 60.0 in | Wt 133.0 lb

## 2019-01-24 DIAGNOSIS — N3000 Acute cystitis without hematuria: Secondary | ICD-10-CM | POA: Diagnosis not present

## 2019-01-24 LAB — POCT URINALYSIS DIP (CLINITEK)
Bilirubin, UA: NEGATIVE
Blood, UA: NEGATIVE
Glucose, UA: NEGATIVE mg/dL
Ketones, POC UA: NEGATIVE mg/dL
Leukocytes, UA: NEGATIVE
Nitrite, UA: NEGATIVE
POC PROTEIN,UA: NEGATIVE
Spec Grav, UA: 1.015
Urobilinogen, UA: 0.2 U/dL
pH, UA: 5

## 2019-01-24 MED ORDER — NITROFURANTOIN MONOHYD MACRO 100 MG PO CAPS: 100.0000 mg | ORAL_CAPSULE | Freq: Two times a day (BID) | ORAL | 0 refills | Status: DC

## 2019-01-24 MED ORDER — PHENAZOPYRIDINE HCL 200 MG PO TABS: 200.0000 mg | ORAL_TABLET | Freq: Three times a day (TID) | ORAL | 0 refills | Status: AC

## 2019-01-24 MED FILL — NITROFURANTOIN MONO-MCR 100: 100 | 5 days supply | Qty: 10 | Fill #0

## 2019-01-24 MED FILL — PHENAZOPYRIDINE 200 MG TAB: 200 | 2 days supply | Qty: 6 | Fill #0

## 2019-01-24 NOTE — Patient Instructions (Signed)
Urinary Tract Infection, Adult A urinary tract infection (UTI) is an infection of any part of the urinary tract. The urinary tract includes:  The kidneys.  The ureters.  The bladder.  The urethra. These organs make, store, and get rid of pee (urine) in the body. What are the causes? This is caused by germs (bacteria) in your genital area. These germs grow and cause swelling (inflammation) of your urinary tract. What increases the risk? You are more likely to develop this condition if:  You have a small, thin tube (catheter) to drain pee.  You cannot control when you pee or poop (incontinence).  You are female, and: ? You use these methods to prevent pregnancy:  A medicine that kills sperm (spermicide).  A device that blocks sperm (diaphragm). ? You have low levels of a female hormone (estrogen). ? You are pregnant.  You have genes that add to your risk.  You are sexually active.  You take antibiotic medicines.  You have trouble peeing because of: ? A prostate that is bigger than normal, if you are female. ? A blockage in the part of your body that drains pee from the bladder (urethra). ? A kidney stone. ? A nerve condition that affects your bladder (neurogenic bladder). ? Not getting enough to drink. ? Not peeing often enough.  You have other conditions, such as: ? Diabetes. ? A weak disease-fighting system (immune system). ? Sickle cell disease. ? Gout. ? Injury of the spine. What are the signs or symptoms? Symptoms of this condition include:  Needing to pee right away (urgently).  Peeing often.  Peeing small amounts often.  Pain or burning when peeing.  Blood in the pee.  Pee that smells bad or not like normal.  Trouble peeing.  Pee that is cloudy.  Fluid coming from the vagina, if you are female.  Pain in the belly or lower back. Other symptoms include:  Throwing up (vomiting).  No urge to eat.  Feeling mixed up (confused).  Being tired  and grouchy (irritable).  A fever.  Watery poop (diarrhea). How is this treated? This condition may be treated with:  Antibiotic medicine.  Other medicines.  Drinking enough water. Follow these instructions at home:  Medicines  Take over-the-counter and prescription medicines only as told by your doctor.  If you were prescribed an antibiotic medicine, take it as told by your doctor. Do not stop taking it even if you start to feel better. General instructions  Make sure you: ? Pee until your bladder is empty. ? Do not hold pee for a long time. ? Empty your bladder after sex. ? Wipe from front to back after pooping if you are a female. Use each tissue one time when you wipe.  Drink enough fluid to keep your pee pale yellow.  Keep all follow-up visits as told by your doctor. This is important. Contact a doctor if:  You do not get better after 1-2 days.  Your symptoms go away and then come back. Get help right away if:  You have very bad back pain.  You have very bad pain in your lower belly.  You have a fever.  You are sick to your stomach (nauseous).  You are throwing up. Summary  A urinary tract infection (UTI) is an infection of any part of the urinary tract.  This condition is caused by germs in your genital area.  There are many risk factors for a UTI. These include having a small, thin   tube to drain pee and not being able to control when you pee or poop.  Treatment includes antibiotic medicines for germs.  Drink enough fluid to keep your pee pale yellow. This information is not intended to replace advice given to you by your health care provider. Make sure you discuss any questions you have with your health care provider. Document Revised: 12/14/2017 Document Reviewed: 07/06/2017 Elsevier Patient Education  2020 Elsevier Inc.  

## 2019-01-24 NOTE — Progress Notes (Signed)
Acute Office Visit  Subjective:    Patient ID: Sydney Herman, female    DOB: May 17, 1980, 39 y.o.   MRN: [id number]  CC: Urinary frequency  HPI Patient is in today for increased urinary frequency, urgency, suprapubic tenderness, and bladder spasms that started on Monday (01/21/2019) and have continually worsened. She reports she has significantly increased her fluid intake to help offset the symptoms. She denies antibiotic use in the past 3 months. She has no change in sexual partners. She denies pregnancy.  URINARY SYMPTOMS Dysuria: no Urinary frequency: yes Urgency: yes Small volume voids: yes Symptom severity: 4/10 Urinary incontinence: no Foul odor: no Hematuria: no Abdominal pain: yes Back pain: no Suprapubic pain/pressure: yes Flank pain: no Fever:  no Vomiting: no Relief with cranberry juice: no Relief with pyridium: no Status: worse Previous urinary tract infection: yes Recurrent urinary tract infection: no Sexual activity: monogomous History of sexually transmitted disease: no Treatments attempted: none    Past Medical History:  Diagnosis Date  . 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 Georgette Dover 2015- was thought to be scar tissue- no growth since that time   . Chicken pox   . Generalized headaches    Has had some migraines in past as well. once a week or less. Usually occipital and associated with sinus pressure as well.   Marland Kitchen Heartburn in pregnancy     Past Surgical History:  Procedure Laterality Date  . CESAREAN SECTION  07/2010   [facility]  . CESAREAN SECTION N/A 12/27/2012   womens 2nd   . WISDOM TOOTH EXTRACTION      Family History  Problem Relation Age of Onset  . Hypertension Mother   . Breast cancer Mother        [place] Burks 99's  . Diabetes Father   . Atrial fibrillation Father   . Other Father        biopsies on kidney- noncancerous. stated potentially precancerous  . Prostate cancer Maternal Grandfather     Social  History   Socioeconomic History  . Marital status: Married    Spouse name: Not on file  . Number of children: 2  . Years of education: Not on file  . Highest education level: Not on file  Occupational History  . Not on file  Tobacco Use  . Smoking status: Never Smoker  . Smokeless tobacco: Never Used  Substance and Sexual Activity  . Alcohol use: Not Currently    Alcohol/week: 2.0 - 3.0 standard drinks    Types: 2 - 3 Standard drinks or equivalent per week  . Drug use: Never  . Sexual activity: Yes    Partners: Male    Birth control/protection: Pill, Condom  Other Topics Concern  . 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 Difficulty of Paying Living Expenses: Not on file  Food Insecurity:   . Worried About Charity fundraiser in the Last Year: Not on file  . Ran  Out of Food in the Last Year: Not on file  Transportation Needs:   . Lack of Transportation (Medical): Not on file  . Lack of Transportation (Non-Medical): Not on file  Physical Activity:   . Days of Exercise per Week: Not on file  . Minutes of Exercise per Session: Not on file  Stress:   . Feeling of Stress : Not on file  Social Connections:   . Frequency of Communication with Friends and Family: Not on file  . Frequency of Social Gatherings with Friends and Family: Not on file  . Attends Religious Services: Not on file  . Active Member of Clubs or Organizations: Not on file  . Attends Archivist Meetings: Not on file  . Marital Status: Not on file  Intimate Partner Violence:   . Fear of  Current or Ex-Partner: Not on file  . Emotionally Abused: Not on file  . Physically Abused: Not on file  . Sexually Abused: Not on file    Outpatient Medications Prior to Visit  Medication Sig Dispense Refill  . CANNABIDIOL PO Take 10 mg by mouth.    . COLLAGEN PO Take by mouth.    . desogestrel-ethinyl estradiol (KARIVA) 0.15-0.02/0.01 MG (21/5) tablet     . Multiple Vitamins-Minerals (WOMENS MULTIVITAMIN PO) Take 1 tablet by mouth daily.     No facility-administered medications prior to visit.    Allergies  Allergen Reactions  . Amoxil [Amoxicillin] Rash    Review of Systems  Constitutional: Negative for appetite change, chills, fatigue and fever.  Gastrointestinal: Negative for abdominal distention, constipation, diarrhea, nausea and vomiting.  Genitourinary: Positive for decreased urine volume, frequency, pelvic pain and urgency. Negative for difficulty urinating, dysuria, flank pain, hematuria, vaginal bleeding, vaginal discharge and vaginal pain.       Objective:    Physical Exam Vitals reviewed.  Constitutional:      Appearance: Normal appearance. She is normal weight.  Cardiovascular:     Rate and Rhythm: Normal rate and regular rhythm.     Pulses: Normal pulses.     Heart sounds: Normal heart sounds.  Pulmonary:     Effort: Pulmonary effort is normal.     Breath sounds: Normal breath sounds.  Abdominal:     General: Abdomen is flat. Bowel sounds are normal. There is no distension.     Palpations: Abdomen is soft. There is no mass.     Tenderness: There is abdominal tenderness in the suprapubic area. There is no right CVA tenderness, left CVA tenderness, guarding or rebound.  Skin:    General: Skin is warm and dry.     Capillary Refill: Capillary refill takes less than 2 seconds.  Neurological:     Mental Status: She is alert and oriented to person, place, and time.  Psychiatric:        Mood and Affect: Mood normal.        Behavior: Behavior normal.      There were no vitals taken for this visit. Wt Readings from Last 3 Encounters:  11/16/18 128 lb 0.6 oz (58.1 kg)  11/02/17 126 lb 12.8 oz (57.5 kg)  03/21/17 126 lb 8 oz (57.4 kg)    Health Maintenance Due  Topic Date Due  . PAP SMEAR-Modifier  02/26/2019    There are no preventive care reminders to display for this patient.   Lab Results  Component Value Date   TSH 0.48 11/02/2017   Lab Results  Component Value Date   WBC  6.4 11/29/2018   HGB 13.2 11/29/2018   HCT 39.7 11/29/2018   MCV 90.4 11/29/2018   PLT 257 11/29/2018   Lab Results  Component Value Date   NA 136 11/29/2018   K 4.4 11/29/2018   CO2 23 11/29/2018   GLUCOSE 90 11/29/2018   BUN 12 11/29/2018   CREATININE 0.88 11/29/2018   BILITOT 0.4 11/29/2018   ALKPHOS 38 (L) 11/02/2017   AST 22 11/29/2018   ALT 14 11/29/2018   PROT 7.3 11/29/2018   ALBUMIN 4.4 11/02/2017   CALCIUM 9.3 11/29/2018   GFR 82.59 11/02/2017   Lab Results  Component Value Date   CHOL 215 (H) 11/29/2018   Lab Results  Component Value Date   HDL 99 11/29/2018   Lab Results  Component Value Date   LDLCALC 97 11/29/2018   Lab Results  Component Value Date   TRIG 93 11/29/2018   Lab Results  Component Value Date   CHOLHDL 2.2 11/29/2018   No results found for: HGBA1C     Assessment & Plan:   1. Acute cystitis without hematuria Urinalysis negative today. Urine for culture also sent, given the patients symptoms. Script for pyridium sent for symptom management. Instructions to take medication three times a day for 2 days and continue increased fluid intake. Script for macrobid sent in the event the patients symptoms do not resolve or worsen over the weekend- specific instructions for when to take medication- pt expressed understanding. Will follow culture for results. Pt to contact office if symptoms worsen, fail to improve.   - POCT URINALYSIS DIP (CLINITEK) - Urine Culture - phenazopyridine (PYRIDIUM) 200 MG  tablet; Take 1 tablet (200 mg total) by mouth 3 (three) times daily for 2 days.  Dispense: 6 tablet; Refill: 0 - nitrofurantoin, macrocrystal-monohydrate, (MACROBID) 100 MG capsule; Take 1 capsule (100 mg total) by mouth 2 (two) times daily.  Dispense: 10 capsule; Refill: 0  Return if symptoms worsen or fail to improve.   Orma Render, NP

## 2019-01-24 NOTE — Addendum Note (Signed)
Addended by: Beatris Ship L on: 01/24/2019 03:06 PM   Modules accepted: Orders

## 2019-01-24 NOTE — Addendum Note (Signed)
Addended by: Jamesetta So on: 01/24/2019 02:43 PM   Modules accepted: Orders

## 2019-01-25 LAB — URINE CULTURE
MICRO NUMBER:: 10043412
Result:: NO GROWTH
SPECIMEN QUALITY:: ADEQUATE

## 2019-02-04 MED FILL — KARIVA 28 DAY TABLET: 0.15-0.02/0 | 84 days supply | Qty: 84 | Fill #4

## 2019-03-05 DIAGNOSIS — Z1239 Encounter for other screening for malignant neoplasm of breast: Secondary | ICD-10-CM | POA: Diagnosis not present

## 2019-03-05 DIAGNOSIS — Z01419 Encounter for gynecological examination (general) (routine) without abnormal findings: Secondary | ICD-10-CM | POA: Diagnosis not present

## 2019-03-05 DIAGNOSIS — Z6825 Body mass index (BMI) 25.0-25.9, adult: Secondary | ICD-10-CM | POA: Diagnosis not present

## 2019-03-05 DIAGNOSIS — Z3041 Encounter for surveillance of contraceptive pills: Secondary | ICD-10-CM | POA: Diagnosis not present

## 2019-03-12 DIAGNOSIS — N924 Excessive bleeding in the premenopausal period: Secondary | ICD-10-CM | POA: Diagnosis not present

## 2019-03-12 DIAGNOSIS — N946 Dysmenorrhea, unspecified: Secondary | ICD-10-CM | POA: Diagnosis not present

## 2019-04-29 MED FILL — KARIVA 28 DAY TABLET: 0.15-0.02/0 | 84 days supply | Qty: 84 | Fill #0

## 2019-07-11 MED FILL — KARIVA 28 DAY TABLET: 0.15-0.02/0 | 84 days supply | Qty: 84 | Fill #1

## 2019-07-30 DIAGNOSIS — L700 Acne vulgaris: Secondary | ICD-10-CM | POA: Diagnosis not present

## 2019-07-30 DIAGNOSIS — L905 Scar conditions and fibrosis of skin: Secondary | ICD-10-CM | POA: Diagnosis not present

## 2019-07-30 DIAGNOSIS — L579 Skin changes due to chronic exposure to nonionizing radiation, unspecified: Secondary | ICD-10-CM | POA: Diagnosis not present

## 2019-07-30 DIAGNOSIS — D2262 Melanocytic nevi of left upper limb, including shoulder: Secondary | ICD-10-CM | POA: Diagnosis not present

## 2019-07-30 DIAGNOSIS — D225 Melanocytic nevi of trunk: Secondary | ICD-10-CM | POA: Diagnosis not present

## 2019-07-30 DIAGNOSIS — L811 Chloasma: Secondary | ICD-10-CM | POA: Diagnosis not present

## 2019-07-30 DIAGNOSIS — L814 Other melanin hyperpigmentation: Secondary | ICD-10-CM | POA: Diagnosis not present

## 2019-07-30 DIAGNOSIS — L720 Epidermal cyst: Secondary | ICD-10-CM | POA: Diagnosis not present

## 2019-07-31 DIAGNOSIS — H5213 Myopia, bilateral: Secondary | ICD-10-CM | POA: Diagnosis not present

## 2019-10-11 MED FILL — KARIVA 28 DAY TABLET: 0.15-0.02/0 | 84 days supply | Qty: 84 | Fill #2

## 2019-10-22 ENCOUNTER — Other Ambulatory Visit (HOSPITAL_COMMUNITY): Payer: Self-pay | Admitting: Optometry

## 2019-10-22 DIAGNOSIS — H16223 Keratoconjunctivitis sicca, not specified as Sjogren's, bilateral: Secondary | ICD-10-CM | POA: Diagnosis not present

## 2019-10-22 MED FILL — RESTASIS 0.05% EYE EMULSION: 0.05 | 90 days supply | Qty: 180 | Fill #0

## 2019-11-11 ENCOUNTER — Other Ambulatory Visit: Payer: Self-pay

## 2019-11-11 DIAGNOSIS — Z0000 Encounter for general adult medical examination without abnormal findings: Secondary | ICD-10-CM | POA: Diagnosis not present

## 2019-11-11 DIAGNOSIS — E538 Deficiency of other specified B group vitamins: Secondary | ICD-10-CM

## 2019-11-11 DIAGNOSIS — E7800 Pure hypercholesterolemia, unspecified: Secondary | ICD-10-CM

## 2019-11-11 NOTE — Progress Notes (Signed)
Labs ordered.

## 2019-11-12 LAB — CBC WITH DIFFERENTIAL/PLATELET
Absolute Monocytes: 531 cells/uL (ref 200–950)
Basophils Absolute: 42 cells/uL (ref 0–200)
Basophils Relative: 0.5 %
Eosinophils Absolute: 66 cells/uL (ref 15–500)
Eosinophils Relative: 0.8 %
HCT: 37.1 % (ref 35.0–45.0)
Hemoglobin: 12.1 g/dL (ref 11.7–15.5)
Lymphs Abs: 2050 cells/uL (ref 850–3900)
MCH: 29.7 pg (ref 27.0–33.0)
MCHC: 32.6 g/dL (ref 32.0–36.0)
MCV: 90.9 fL (ref 80.0–100.0)
MPV: 10.8 fL (ref 7.5–12.5)
Monocytes Relative: 6.4 %
Neutro Abs: 5611 cells/uL (ref 1500–7800)
Neutrophils Relative %: 67.6 %
Platelets: 242 10*3/uL (ref 140–400)
RBC: 4.08 10*6/uL (ref 3.80–5.10)
RDW: 12.6 % (ref 11.0–15.0)
Total Lymphocyte: 24.7 %
WBC: 8.3 10*3/uL (ref 3.8–10.8)

## 2019-11-12 LAB — COMPLETE METABOLIC PANEL WITH GFR
AG Ratio: 1.5 (calc) (ref 1.0–2.5)
ALT: 13 U/L (ref 6–29)
AST: 21 U/L (ref 10–30)
Albumin: 4.3 g/dL (ref 3.6–5.1)
Alkaline phosphatase (APISO): 40 U/L (ref 31–125)
BUN: 16 mg/dL (ref 7–25)
CO2: 26 mmol/L (ref 20–32)
Calcium: 9.1 mg/dL (ref 8.6–10.2)
Chloride: 99 mmol/L (ref 98–110)
Creat: 0.85 mg/dL (ref 0.50–1.10)
GFR, Est African American: 101 mL/min/{1.73_m2} (ref >=60)
GFR, Est Non African American: 87 mL/min/{1.73_m2} (ref >=60)
Globulin: 2.9 g/dL (calc) (ref 1.9–3.7)
Glucose, Bld: 95 mg/dL (ref 65–99)
Potassium: 3.9 mmol/L (ref 3.5–5.3)
Sodium: 133 mmol/L — ABNORMAL LOW (ref 135–146)
Total Bilirubin: 0.4 mg/dL (ref 0.2–1.2)
Total Protein: 7.2 g/dL (ref 6.1–8.1)

## 2019-11-12 LAB — LIPID PANEL W/REFLEX DIRECT LDL
Cholesterol: 220 mg/dL — ABNORMAL HIGH (ref <200)
HDL: 82 mg/dL (ref >=50)
LDL Cholesterol (Calc): 109 mg/dL (calc) — ABNORMAL HIGH
Non-HDL Cholesterol (Calc): 138 mg/dL (calc) — ABNORMAL HIGH (ref <130)
Total CHOL/HDL Ratio: 2.7 (calc) (ref <5.0)
Triglycerides: 175 mg/dL — ABNORMAL HIGH (ref <150)

## 2019-11-12 LAB — VITAMIN B12: Vitamin B-12: 269 pg/mL (ref 200–1100)

## 2019-11-18 ENCOUNTER — Other Ambulatory Visit: Payer: Self-pay

## 2019-11-18 ENCOUNTER — Ambulatory Visit (INDEPENDENT_AMBULATORY_CARE_PROVIDER_SITE_OTHER): Payer: 59 | Admitting: Osteopathic Medicine

## 2019-11-18 ENCOUNTER — Encounter: Payer: Self-pay | Admitting: Osteopathic Medicine

## 2019-11-18 VITALS — BP 129/82 | HR 69 | Temp 97.7°F | Wt 131.0 lb

## 2019-11-18 DIAGNOSIS — Z0000 Encounter for general adult medical examination without abnormal findings: Secondary | ICD-10-CM

## 2019-11-18 DIAGNOSIS — Z833 Family history of diabetes mellitus: Secondary | ICD-10-CM | POA: Diagnosis not present

## 2019-11-18 DIAGNOSIS — N6310 Unspecified lump in the right breast, unspecified quadrant: Secondary | ICD-10-CM | POA: Diagnosis not present

## 2019-11-18 DIAGNOSIS — E538 Deficiency of other specified B group vitamins: Secondary | ICD-10-CM | POA: Diagnosis not present

## 2019-11-18 DIAGNOSIS — E785 Hyperlipidemia, unspecified: Secondary | ICD-10-CM | POA: Diagnosis not present

## 2019-11-18 DIAGNOSIS — R635 Abnormal weight gain: Secondary | ICD-10-CM

## 2019-11-18 DIAGNOSIS — F419 Anxiety disorder, unspecified: Secondary | ICD-10-CM | POA: Diagnosis not present

## 2019-11-18 DIAGNOSIS — R5382 Chronic fatigue, unspecified: Secondary | ICD-10-CM

## 2019-11-18 MED ORDER — CYANOCOBALAMIN 1000 MCG/ML IJ SOLN
1000.0000 ug | Freq: Once | INTRAMUSCULAR | Status: AC
  Administered 2019-11-18: 1000 ug via INTRAMUSCULAR

## 2019-11-18 NOTE — Progress Notes (Signed)
HPI: Sydney Herman is a 39 y.o. 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  she presents to [facility] today, 11/18/19,  for chief complaint of:  Annual physical   NEW PROBLEM: Fatigue - ongoing since beginning of 2021, associated w/ weight gain, decreased sleep, increased anxiety   NEW PROBLEM: Breast lump on R - noted few weeks ago, stable over that time, no skin changes       Past medical, surgical, social and family history reviewed:  Patient Active Problem List   Diagnosis Date Noted  . History of abnormal cervical Pap smear 11/16/2018  . Family history of type 2 diabetes mellitus in father 11/16/2018  . Family history of breast cancer in mother 11/16/2018  . Mild hyperlipidemia 11/02/2017  . Vitamin B12 deficiency 02/20/2017  . Generalized headaches     Past Surgical History:  Procedure Laterality Date  . CESAREAN SECTION  07/2010   [facility]  . CESAREAN SECTION N/A 12/27/2012   womens 2nd   . WISDOM TOOTH EXTRACTION      Social History   Tobacco Use  . Smoking status: Never Smoker  . Smokeless tobacco: Never Used  Substance Use Topics  . Alcohol use: Not Currently    Alcohol/week: 2.0 - 3.0 standard drinks    Types: 2 - 3 Standard drinks or equivalent per week    Family History  Problem Relation Age of Onset  . Hypertension Mother   . Breast cancer Mother        early 26's  . Diabetes Father   . Atrial fibrillation Father   . Other Father        biopsies on kidney- noncancerous. stated potentially precancerous  . Prostate cancer Maternal Grandfather      Current medication list and allergy/intolerance information reviewed:    Current Outpatient Medications  Medication Sig Dispense Refill  . CANNABIDIOL PO Take 10 mg by mouth.    . COLLAGEN PO Take by mouth.    . desogestrel-ethinyl  estradiol (KARIVA) 0.15-0.02/0.01 MG (21/5) tablet     . Multiple Vitamins-Minerals (WOMENS MULTIVITAMIN PO) Take 1 tablet by mouth daily.     No current facility-administered medications for this visit.    Allergies  Allergen Reactions  . Amoxil [Amoxicillin] Rash          Depression screen [facility] 2/9 11/18/2019 11/16/2018 11/02/2017  Decreased Interest 0 1 0  Down, Depressed, Hopeless 0 1 0  PHQ - 2 Score 0 2 0  Altered sleeping - - -  Tired, decreased energy - - -  Change in appetite - - -  Feeling bad or failure about yourself  - - -  Trouble concentrating - - -  Moving slowly or fidgety/restless - - -  Suicidal thoughts - - -  PHQ-9 Score - - -   GAD 7 : Generalized Anxiety Score 02/20/2017  Nervous, Anxious, on Edge 1  Control/stop worrying 1  Worry too much - different things 2  Trouble relaxing 0  Restless 0  Easily annoyed or irritable 2  Afraid - awful might happen 0  Total GAD 7 Score 6     Exam:  BP 129/82 (BP Location: Left Arm, Patient Position: Sitting, Cuff Size: Normal)   Pulse 69   Temp 97.7 F (36.5 C) (Oral)   Wt 131 lb (59.4 kg)   BMI 25.58 kg/m  Constitutional: VS see above. General Appearance: alert, well-developed, well-nourished, NAD  Eyes: Normal lids and conjunctive, non-icteric sclera  Ears, Nose, Mouth, Throat: TM normal bilaterally.  Neck: No masses, trachea midline. No thyroid enlargement. No tenderness/mass appreciated. No lymphadenopathy  Respiratory: Normal respiratory effort. no wheeze, no rhonchi, no rales  Cardiovascular: S1/S2 normal, no murmur, no rub/gallop auscultated. RRR. No lower extremity edema.   Gastrointestinal: Nontender, no masses. No hepatomegaly, no splenomegaly. No hernia appreciated. Bowel sounds normal. Rectal exam deferred.   Musculoskeletal: Gait normal. No clubbing/cyanosis of digits.   Neurological: Normal balance/coordination. No tremor. No cranial nerve deficit on limited exam. Motor and sensation  intact and symmetric. Cerebellar reflexes intact.   Skin: warm, dry, intact. No rash/ulcer. No concerning nevi or subq nodules on limited exam.    Psychiatric: Normal judgment/insight. Normal mood and affect. Oriented x3.   BREAST: No rashes/skin changes, normal fibrous breast tissue, no masses or tenderness, normal nipple without discharge, normal axilla. Ares of concern on R breast 10:00 feels typical of lobular border / fibrocystic change    No results found for this or any previous visit (from the past 72 hour(s)).  No results found.     ASSESSMENT/PLAN: The primary encounter diagnosis was Annual physical exam. Diagnoses of Mild hyperlipidemia, Vitamin B12 deficiency, Family history of type 2 diabetes mellitus in father, Lump of right breast, Chronic fatigue, Abnormal weight gain, and Anxiety were also pertinent to this visit.  1. Annual physical exam Preventive care as below   2. Mild hyperlipidemia No concerns   3. Vitamin B12 deficiency --> injection today, OTC 2000 mcg daily after that  4. Family history of type 2 diabetes mellitus in father Glc ok  5. Lump of right breast Feels like normal breast tissue but pt w/ FH CA in mother --> diag mammo and [place] for confirmation   6. Chronic fatigue 7. Abnormal weight gain 8. Anxiety --> TSH, Vitamin D, supplement B12 may help. Consider change OCP, pt open to trying Lexapro for anxiety, can start this at 5 mg id labs ok    Orders Placed This Encounter  Procedures  . MM Digital Diagnostic Bilat  . US BREAST COMPLETE UNI RIGHT INC AXILLA  . TSH  . BASIC METABOLIC PANEL WITH GFR  . VITAMIN D 25 Hydroxy (Vit-D Deficiency, Fractures)    Meds ordered this encounter  Medications  . cyanocobalamin ((VITAMIN B-12)) injection 1,000 mcg    Patient Instructions  Fatigue:  B12 injection today, consider adding B12 2000 mcg OTC daily  Checking thyroid and vitamin D  Rechecking sodium levels   Consider anxiety Rx based on  labs - Lexapro low dose 5 mg  Consider change birth control based on labs / response to Lexapro   R breast lump:  Feels like normal tissue on exam, but will get mammogram and ultrasound just to be safe!   General Preventive Care  Most recent routine screening labs: see attached  Blood pressure goal 130/80 or less.   Tobacco: don't!   Alcohol: responsible moderation is ok for most adults - if you have concerns about your alcohol intake, please talk to me!   Exercise: as tolerated to reduce risk of cardiovascular disease and diabetes. Strength training will also prevent osteoporosis.   Mental health: if need for mental health care (medicines, counseling, other), or concerns about moods, please let me know!   Sexual / Reproductive health: if need for STD testing, or if concerns with libido/pain problems, or if you need  to discuss family planning, please let me or OBGYN know!   Advanced Directive: Living Will and/or Healthcare Power of Attorney recommended for all adults, regardless of age or health.  Vaccines  Flu vaccine: every fall.   Shingles vaccine: after age 71.   Pneumonia vaccines: after age 22.  Tetanus booster: every 10 years / 3rd trimester of pregnancy  COVID vaccine: THANKS for getting your vaccine! :) Cancer screenings   Colon cancer screening: for everyone age 72-75.   Breast cancer screening: mammogram starting at age 52   Cervical cancer screening: Pap every 5 years if normal. Can usually stop at age 89 or w/ hysterectomy.   Lung cancer screening: not needed for non-smokers  Infection screenings  . HIV, Gonorrhea/Chlamydia and Hepatitis C: recommended screening at least once age 61-65, more often as needed (was almost certainly done during early pregnancy, we just do not have results on file). . TB: certain at-risk populations, or depending on work requirements and/or travel history Other . Bone Density Test: recommended at age 34        Please  note: Preventive care issues were addressed today as part of your annual wellness physical, and this care should be covered under your insurance. However there were other medical issues which were also addressed today, and insurance may bill you separately for a "problem-based visit" for this care: R breast lump, fatigue, anxiety. Any questions or concerns about charges which may appear on your billing statement should be directed to your insurance company or to [facility] billing department, and they will contact our office if there are further concerns.          Visit summary with medication list and pertinent instructions was printed for patient to review. All questions at time of visit were answered - patient instructed to contact office with any additional concerns or updates. ER/RTC precautions were reviewed with the patient.    Please note: voice recognition software was used to produce this document, and typos may escape review. Please contact Dr. Sheppard Coil for any needed clarifications.     Follow-up plan: Return in about 1 year (around 11/17/2020) for [place] (call week prior to visit for lab orders).

## 2019-11-18 NOTE — Patient Instructions (Addendum)
Fatigue:  B12 injection today, consider adding B12 2000 mcg OTC daily  Checking thyroid and vitamin D  Rechecking sodium levels   Consider anxiety Rx based on labs - Lexapro low dose 5 mg  Consider change birth control based on labs / response to Lexapro   R breast lump:  Feels like normal tissue on exam, but will get mammogram and ultrasound just to be safe!   General Preventive Care  Most recent routine screening labs: see attached  Blood pressure goal 130/80 or less.   Tobacco: don't!   Alcohol: responsible moderation is ok for most adults - if you have concerns about your alcohol intake, please talk to me!   Exercise: as tolerated to reduce risk of cardiovascular disease and diabetes. Strength training will also prevent osteoporosis.   Mental health: if need for mental health care (medicines, counseling, other), or concerns about moods, please let me know!   Sexual / Reproductive health: if need for STD testing, or if concerns with libido/pain problems, or if you need to discuss family planning, please let me or OBGYN know!   Advanced Directive: Living Will and/or Healthcare Power of Attorney recommended for all adults, regardless of age or health.  Vaccines  Flu vaccine: every fall.   Shingles vaccine: after age 53.   Pneumonia vaccines: after age 75.  Tetanus booster: every 10 years / 3rd trimester of pregnancy  COVID vaccine: THANKS for getting your vaccine! :) Cancer screenings   Colon cancer screening: for everyone age 18-75.   Breast cancer screening: mammogram starting at age 35   Cervical cancer screening: Pap every 5 years if normal. Can usually stop at age 15 or w/ hysterectomy.   Lung cancer screening: not needed for non-smokers  Infection screenings  . HIV, Gonorrhea/Chlamydia and Hepatitis C: recommended screening at least once age 50-65, more often as needed (was almost certainly done during early pregnancy, we just do not have results on  file). . TB: certain at-risk populations, or depending on work requirements and/or travel history Other . Bone Density Test: recommended at age 75        Please note: Preventive care issues were addressed today as part of your annual wellness physical, and this care should be covered under your insurance. However there were other medical issues which were also addressed today, and insurance may bill you separately for a "problem-based visit" for this care: R breast lump, fatigue, anxiety. Any questions or concerns about charges which may appear on your billing statement should be directed to your insurance company or to [facility] billing department, and they will contact our office if there are further concerns.

## 2019-11-19 ENCOUNTER — Other Ambulatory Visit: Payer: Self-pay | Admitting: Osteopathic Medicine

## 2019-11-19 LAB — BASIC METABOLIC PANEL WITH GFR
BUN: 14 mg/dL (ref 7–25)
CO2: 29 mmol/L (ref 20–32)
Calcium: 9.9 mg/dL (ref 8.6–10.2)
Chloride: 101 mmol/L (ref 98–110)
Creat: 0.79 mg/dL (ref 0.50–1.10)
GFR, Est African American: 110 mL/min/{1.73_m2} (ref >=60)
GFR, Est Non African American: 95 mL/min/{1.73_m2} (ref >=60)
Glucose, Bld: 95 mg/dL (ref 65–139)
Potassium: 4.4 mmol/L (ref 3.5–5.3)
Sodium: 137 mmol/L (ref 135–146)

## 2019-11-19 LAB — TSH: TSH: 0.64 mIU/L

## 2019-11-19 LAB — VITAMIN D 25 HYDROXY (VIT D DEFICIENCY, FRACTURES): Vit D, 25-Hydroxy: 54 ng/mL (ref 30–100)

## 2019-11-19 MED ORDER — ESCITALOPRAM OXALATE 5 MG PO TABS: 5.0000 mg | ORAL_TABLET | Freq: Every day | ORAL | 0 refills | Status: DC

## 2019-11-19 MED FILL — ESCITALOPRAM 5 MG TABLET: 5 | 90 days supply | Qty: 90 | Fill #0

## 2019-11-19 NOTE — Addendum Note (Signed)
Addended by: Maryla Morrow on: 11/19/2019 08:11 AM   Modules accepted: Orders

## 2019-12-30 MED FILL — KARIVA 28 DAY TABLET: 0.15-0.02/0 | 84 days supply | Qty: 84 | Fill #3

## 2020-01-15 ENCOUNTER — Other Ambulatory Visit: Payer: Self-pay | Admitting: Osteopathic Medicine

## 2020-01-15 ENCOUNTER — Ambulatory Visit
Admission: RE | Admit: 2020-01-15 | Discharge: 2020-01-15 | Disposition: A | Discharge status: Home / Self Care | Payer: 59 | Source: Ambulatory Visit | Attending: Osteopathic Medicine | Admitting: Osteopathic Medicine

## 2020-01-15 ENCOUNTER — Other Ambulatory Visit: Payer: Self-pay

## 2020-01-15 DIAGNOSIS — N6489 Other specified disorders of breast: Secondary | ICD-10-CM | POA: Diagnosis not present

## 2020-01-15 DIAGNOSIS — R928 Other abnormal and inconclusive findings on diagnostic imaging of breast: Secondary | ICD-10-CM | POA: Diagnosis not present

## 2020-01-15 DIAGNOSIS — R921 Mammographic calcification found on diagnostic imaging of breast: Secondary | ICD-10-CM | POA: Diagnosis not present

## 2020-01-20 ENCOUNTER — Encounter: Payer: Self-pay | Admitting: Osteopathic Medicine

## 2020-01-20 ENCOUNTER — Ambulatory Visit (INDEPENDENT_AMBULATORY_CARE_PROVIDER_SITE_OTHER): Payer: 59 | Admitting: Osteopathic Medicine

## 2020-01-20 ENCOUNTER — Other Ambulatory Visit: Payer: Self-pay

## 2020-01-20 ENCOUNTER — Other Ambulatory Visit: Payer: Self-pay | Admitting: Osteopathic Medicine

## 2020-01-20 VITALS — BP 121/81 | HR 71 | Temp 97.7°F | Wt 127.0 lb

## 2020-01-20 DIAGNOSIS — F419 Anxiety disorder, unspecified: Secondary | ICD-10-CM | POA: Diagnosis not present

## 2020-01-20 MED ORDER — ESCITALOPRAM OXALATE 5 MG PO TABS
5.0000 mg | ORAL_TABLET | Freq: Every day | ORAL | 3 refills | Status: DC
Start: 2020-01-20 — End: 2020-01-20

## 2020-01-20 NOTE — Progress Notes (Signed)
Sydney Herman is a 40 y.o. female who presents to  [place] at [facility]  today, 01/20/20, seeking care for the following:  . Follow up on new Rx for anxiety - we started lexapro a few months ago at 5 mg low dose. Reports improvement in anxiety and would like to stay on it for now. Feels working well, does not feel need to increase dose      ASSESSMENT & PLAN with other pertinent findings:  The encounter diagnosis was Anxiety.    Conitnue Lexapro at least 6 mos total At that time can continue or d/c if desired If helping, no compelling reason to d/c - pt preference   No results found for this or any previous visit (from the past 24 hour(s)).  There are no Patient Instructions on file for this visit.  No orders of the defined types were placed in this encounter.   Meds ordered this encounter  Medications  . escitalopram (LEXAPRO) 5 MG tablet    Sig: Take 1 tablet (5 mg total) by mouth daily.    Dispense:  90 tablet    Refill:  3       Follow-up instructions: Return for ANNUAL DUE 11/2020, SEE [place] SOONER IF NEEDED. Ruth 04/2020.                                         BP 121/81 (BP Location: Left Arm, Patient Position: Sitting, Cuff Size: Normal)   Pulse 71   Temp 97.7 F (36.5 C) (Oral)   Wt 127 lb 0.6 oz (57.6 kg)   LMP 01/08/2020 (Approximate)   BMI 24.81 kg/m   Current Meds  Medication Sig  . CANNABIDIOL PO Take 10 mg by mouth.  . COLLAGEN PO Take by mouth.  . desogestrel-ethinyl estradiol (MIRCETTE) 0.15-0.02/0.01 MG (21/5) tablet   . Multiple Vitamins-Minerals (WOMENS MULTIVITAMIN PO) Take 1 tablet by mouth daily.  . [DISCONTINUED] escitalopram (LEXAPRO) 5 MG tablet Take 1 tablet (5 mg total) by mouth daily.   Depression screen [facility] 2/9 11/18/2019 11/16/2018 11/02/2017  Decreased Interest 0 1 0  Down, Depressed, Hopeless 0 1 0  PHQ - 2  Score 0 2 0  Altered sleeping - - -  Tired, decreased energy - - -  Change in appetite - - -  Feeling bad or failure about yourself  - - -  Trouble concentrating - - -  Moving slowly or fidgety/restless - - -  Suicidal thoughts - - -  PHQ-9 Score - - -   GAD 7 : Generalized Anxiety Score 02/20/2017  Nervous, Anxious, on Edge 1  Control/stop worrying 1  Worry too much - different things 2  Trouble relaxing 0  Restless 0  Easily annoyed or irritable 2  Afraid - awful might happen 0  Total GAD 7 Score 6     No results found for this or any previous visit (from the past 72 hour(s)).  No results found.     All questions at time of visit were answered - patient instructed to contact office with any additional concerns or updates.  ER/RTC precautions were reviewed with the patient as applicable.   Please note: voice recognition software was used to produce this document, and typos may escape review. Please contact Dr. Sheppard Coil for any needed clarifications.

## 2020-01-23 ENCOUNTER — Ambulatory Visit
Admission: RE | Admit: 2020-01-23 | Discharge: 2020-01-23 | Disposition: A | Discharge status: Home / Self Care | Payer: 59 | Source: Ambulatory Visit | Attending: Osteopathic Medicine | Admitting: Osteopathic Medicine

## 2020-01-23 ENCOUNTER — Other Ambulatory Visit: Payer: Self-pay

## 2020-01-23 DIAGNOSIS — C50411 Malignant neoplasm of upper-outer quadrant of right female breast: Secondary | ICD-10-CM | POA: Diagnosis not present

## 2020-01-23 DIAGNOSIS — C50919 Malignant neoplasm of unspecified site of unspecified female breast: Secondary | ICD-10-CM

## 2020-01-23 DIAGNOSIS — R921 Mammographic calcification found on diagnostic imaging of breast: Secondary | ICD-10-CM

## 2020-01-23 HISTORY — DX: Malignant neoplasm of unspecified site of unspecified female breast: C50.919

## 2020-01-28 ENCOUNTER — Telehealth: Payer: Self-pay | Admitting: Hematology and Oncology

## 2020-01-28 ENCOUNTER — Other Ambulatory Visit: Payer: Self-pay

## 2020-01-28 ENCOUNTER — Ambulatory Visit
Admission: RE | Admit: 2020-01-28 | Discharge: 2020-01-28 | Disposition: A | Discharge status: Home / Self Care | Payer: 59 | Source: Ambulatory Visit | Attending: Radiation Oncology | Admitting: Radiation Oncology

## 2020-01-28 ENCOUNTER — Telehealth: Payer: Self-pay | Admitting: *Deleted

## 2020-01-28 ENCOUNTER — Other Ambulatory Visit: Payer: Self-pay | Admitting: Genetic Counselor

## 2020-01-28 ENCOUNTER — Other Ambulatory Visit: Payer: Self-pay | Admitting: Osteopathic Medicine

## 2020-01-28 ENCOUNTER — Encounter: Payer: Self-pay | Admitting: *Deleted

## 2020-01-28 ENCOUNTER — Encounter: Payer: Self-pay | Admitting: Radiation Oncology

## 2020-01-28 DIAGNOSIS — C50911 Malignant neoplasm of unspecified site of right female breast: Secondary | ICD-10-CM

## 2020-01-28 DIAGNOSIS — C50419 Malignant neoplasm of upper-outer quadrant of unspecified female breast: Secondary | ICD-10-CM | POA: Insufficient documentation

## 2020-01-28 DIAGNOSIS — Z803 Family history of malignant neoplasm of breast: Secondary | ICD-10-CM | POA: Diagnosis not present

## 2020-01-28 DIAGNOSIS — Z808 Family history of malignant neoplasm of other organs or systems: Secondary | ICD-10-CM | POA: Diagnosis not present

## 2020-01-28 DIAGNOSIS — C50411 Malignant neoplasm of upper-outer quadrant of right female breast: Secondary | ICD-10-CM | POA: Diagnosis not present

## 2020-01-28 DIAGNOSIS — Z170 Estrogen receptor positive status [ER+]: Secondary | ICD-10-CM | POA: Diagnosis not present

## 2020-01-28 MED ORDER — LORAZEPAM 0.5 MG PO TABS: ORAL_TABLET | ORAL | 0 refills | Status: DC

## 2020-01-28 MED FILL — LORazepam 0.5 MG TABS: 0.5 | 5 days supply | Qty: 30 | Fill #0

## 2020-01-28 NOTE — Progress Notes (Signed)
Location of Breast Cancer: Right Breast UOQ  Did patient present with symptoms (if so, please note symptoms) or was this found on screening mammography?: Patient was able to palpate the mass.  No nipple discharge or distortion to the breast.  MRI Breast: unscheduled at this time.  Mammogram/US 01/15/2020: There are suspicious calcifications spanning 2 x 2 cm in the right breast in the region of the patient's palpable lump. There is no discrete mass in this region seen on ultrasound although the tissue is somewhat hypoechoic. There is also suggestion of distortion posterior to the calcifications on the full paddle MLO view which does not persist on the spot view.  Histology per Pathology Report: Right Breast 01/23/2020   Receptor Status: ER(+5% weak staining), PR (-), Her2-neu (equivocal), Ki-67(10%)  -More prognostic tests are being done. -Mammaprint  Past/Anticipated interventions by surgeon, if any:  Dr. Donne Hazel 01/29/2020  Past/Anticipated interventions by medical oncology, if any: Chemotherapy  Dr. Lindi Adie 01/29/2020  Lymphedema issues, if any:  no  Pain issues, if any:  no  SAFETY ISSUES:  Prior radiation? No  Pacemaker/ICD? No  Possible current pregnancy? Oral Contraceptives 01/08/2020  Is the patient on methotrexate? No  Current Complaints / other details:   -Mom had breast cancer in her early 59's- Lumpectomy, chemo, XRT     Cori Razor, RN 01/28/2020,2:36 PM

## 2020-01-28 NOTE — Telephone Encounter (Signed)
Spoke to pt, provided navigation resources and contact information. Confirmed appt to see Dr. Donne Hazel on 01/29/20 at 8:30am and Dr. Lindi Adie at [facility] instructions and directions. Encourage pt to calls with needs. Received verbal understanding.

## 2020-01-28 NOTE — Telephone Encounter (Signed)
Received a new pt referral from the breast center for new dx of breast cancer. Sydney Herman has been cld and scheduled to see Dr. Lindi Adie on 1/19 at 10am. Pt aware to arrive 20 minutes early.

## 2020-01-28 NOTE — Progress Notes (Signed)
[facility] CONSULT NOTE  Patient Care Team: Emeterio Reeve, DO as PCP - General (Osteopathic Medicine) [name], MD as Consulting Physician (Obstetrics and Gynecology) Mauro Kaufmann, RN as Oncology Nurse Navigator Rockwell Germany, RN as Oncology Nurse Navigator  CHIEF COMPLAINTS/PURPOSE OF CONSULTATION:  Newly diagnosed breast cancer  HISTORY OF PRESENTING ILLNESS:  Sydney Herman 40 y.o. female is here because of recent diagnosis of invasive ductal carcinoma of the right breast. Patient palpated a right breast lump. Diagnostic mammogram and [place] on 01/15/19 showed calcifications spanning 2.0cm in the right breast. Biopsy on 01/23/20 showed invasive and in situ carcinoma, grade 2. She presents to the clinic today for initial evaluation and discussion of treatment options.   I reviewed her records extensively and collaborated the history with the patient.  SUMMARY OF ONCOLOGIC HISTORY: Oncology History  Malignant neoplasm of upper outer quadrant of female breast ([place])  01/28/2020 Initial Diagnosis   Patient palpated a right breast lump. Diagnostic mammogram and US showed calcifications spanning 2.0cm in the right breast. Biopsy showed invasive and in situ carcinoma, grade 2. ER 5% weak, PR 0% negative, HER2 equiv, Ki 10%     01/28/2020 Cancer Staging   Staging form: Breast, AJCC 8th Edition - Clinical stage from 01/28/2020: Stage IA (cT1c, cN0, cM0, G2, ER+, PR-, HER2: Equivocal) - Signed by Nicholas Lose, MD on 01/29/2020     MEDICAL HISTORY:  Past Medical History:  Diagnosis Date  . 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 Georgette Dover 2015- was thought to be scar tissue- no growth since that time   . Breast cancer ([place]) 01/23/2020   Right  . Chicken pox   . Generalized headaches    Has had some migraines in past as well. once a week or less. Usually occipital and associated with sinus pressure as well.   Marland Kitchen Heartburn in pregnancy      SURGICAL HISTORY: Past Surgical History:  Procedure Laterality Date  . CESAREAN SECTION  07/2010   [facility]  . CESAREAN SECTION N/A 12/27/2012   [facility]om[facility] 2nd   . WISDOM TOOTH EXTRACTION      SOCIAL HISTORY: Social History   Socioeconomic History  . Marital status: Married    Spouse name: Not on file  . Number of children: 2  . Years of education: Not on file  . Highest education level: Not on file  Occupational History  . Not on file  Tobacco Use  . Smoking status: Never Smoker  . Smokeless tobacco: Never Used  Vaping Use  . Vaping Use: Never used  Substance and Sexual Activity  . Alcohol use: Not Currently    Alcohol/week: 2.0 - 3.0 standard drinks    Types: 2 - 3 Standard drinks or equivalent per week  . Drug use: Never  . Sexual activity: Yes    Partners: Male    Birth control/protection: Pill, Condom  Other Topics Concern  . 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 Difficulty of Paying Living Expenses: Not hard at all  Food Insecurity: No Food Insecurity  . Worried About Charity fundraiser in the Last Year: Never true  . Ran Out of Food in the Last Year: Never true  Transportation Needs: No Transportation Needs  . Lack of Transportation (Medical): No  . Lack of Transportation (Non-Medical): No  Physical Activity: Not on file  Stress: Not on file  Social Connections: Not on file  Intimate Partner Violence: Not on file    FAMILY HISTORY: Family History  Problem Relation Age of Onset  . Hypertension Mother   . Breast cancer  Mother        early 15's  . Diabetes Father   . Atrial fibrillation Father   . Other Father        biopsies on kidney- noncancerous. stated potentially precancerous  . Prostate cancer Maternal Grandfather     ALLERGIES:  is allergic to amoxil [amoxicillin].  MEDICATIONS:  Current Outpatient Medications  Medication Sig Dispense Refill  . CANNABIDIOL PO Take 10 mg by mouth.    . COLLAGEN PO Take by mouth.    . desogestrel-ethinyl estradiol (MIRCETTE) 0.15-0.02/0.01 MG (21/5) tablet     . escitalopram (LEXAPRO) 5 MG tablet Take 1 tablet (5 mg total) by mouth daily. 90 tablet 3  . LORazepam (ATIVAN) 0.5 MG tablet 1 tab po q 4-6 hours prn anxiety or 1 tab po 30 minutes prior to MRI or biopsy. 30 tablet 0  . Multiple Vitamins-Minerals (WOMENS MULTIVITAMIN PO) Take 1 tablet by mouth daily.     No current facility-administered medications for this visit.    REVIEW OF SYSTEMS:   Constitutional: Denies fevers, chills or abnormal night sweats Severe emotional distress: Patient has been on Lexapro. Breast: Palpable right breast mass All other systems were reviewed with the patient and are negative.  PHYSICAL EXAMINATION: ECOG PERFORMANCE STATUS: 0 - Asymptomatic  Vitals:   01/29/20 1047  BP: 129/84  Pulse: 78  Resp: 16  Temp: 98.1 F (36.7 C)  SpO2: 100%   Filed Weights   01/29/20 1047  Weight: 125 lb 12.8 oz (57.1 kg)       LABORATORY DATA:  I have reviewed the data as listed Lab Results  Component Value Date   WBC 8.3 11/11/2019   HGB 12.1 11/11/2019   HCT 37.1 11/11/2019   MCV 90.9 11/11/2019   PLT 242 11/11/2019   Lab Results  Component Value Date   NA 137 11/18/2019   K 4.4 11/18/2019   CL 101 11/18/2019   CO2 29 11/18/2019    RADIOGRAPHIC STUDIES: I have personally reviewed the radiological reports and agreed with the findings in the report.  ASSESSMENT AND PLAN:  Malignant neoplasm of upper outer quadrant of female breast ([place]) 01/28/2020: Palpable  right breast lump: Mammogram and ultrasound revealed calcifications spanning 2 cm, biopsy revealed IDC with DCIS, grade 2, ER 5% weak, PR 0%, HER2 equivocal by IHC, FISH pending, Ki-67 10%  Pathology and radiology counseling: Discussed with the patient, the details of pathology including the type of breast cancer,the clinical staging, the significance of ER, PR and HER-2/neu receptors and the implications for treatment. After reviewing the pathology in detail, we proceeded to discuss the different treatment options between surgery, radiation, chemotherapy, antiestrogen therapies.  The below treatment plan is assuming that the HER2 is negative if she is HER2 positive then the treatment will be neoadjuvant [facility].  Treatment plan: 1. Neo- adjuvant chemotherapy with  Adriamycin and Cytoxan pembrolizumab followed by Taxol and carboplatin pembrolizumab 2. Breast conserving surgery with sentinel lymph node biopsy (assuming genetic testing does not reveal BRCA mutation) 3. Adjuvant radiation 4. followed by adjuvant antiestrogen therapy 5.  Genetic testing  Counseling: I discussed with the patient that the tumor effectively is triple negative given the weak low ER positivity.  Given the high risk of distant recurrence for these types of tumors systemic chemotherapy is warranted.  Chemotherapy Counseling: I discussed the risks and benefits of chemotherapy including the risks of nausea/ vomiting, risk of infection from low WBC count, fatigue due to chemo or anemia, bruising or bleeding due to low platelets, mouth sores, loss/ change in taste and decreased appetite. Liver and kidney function will be monitored through out chemotherapy as abnormalities in liver and kidney function may be a side effect of treatment. Cardiac dysfunction due to Adriamycin and neuropathy risk with Taxol were discussed in detail. Risk of permanent bone marrow dysfunction and leukemia due to chemo were also discussed.   Return to  clinic in 1 week to start chemo.  All questions were answered. The patient knows to call the clinic with any problems, questions or concerns.   Rulon Eisenmenger, MD, MPH 01/29/2020    I, Molly Dorshimer, am acting as scribe for Nicholas Lose, MD.  I have reviewed the above documentation for accuracy and completeness, and I agree with the above.

## 2020-01-28 NOTE — Progress Notes (Signed)
Radiation Oncology         [phone] ________________________________  Name: Sydney Herman        MRN: [id number]  Date of Service: 01/28/2020 DOB: 08-12-1980  [id number], Lanelle Bal, DO  Rolm Bookbinder, MD     REFERRING PHYSICIAN: Rolm Bookbinder, MD   DIAGNOSIS: The encounter diagnosis was Malignant neoplasm of upper-outer quadrant of right breast in female, estrogen receptor positive ([place]).   HISTORY OF PRESENT ILLNESS: Sydney Herman is a 40 y.o. female seen in the multidisciplinary breast clinic for a new diagnosis of right breast cancer. The patient was noted to have a palpable mass in the right breast. She has had normal mammograms previously given her mother's history of breast cancer. However she recently felt a mass in the right breast and diagnostic work up with mammogram on 01/15/20 showed a 2 x 2 cm group of calcifications in the upper outer quadrant of the right breast. There was no discrete mass, but possible distortion of the breast was seen on mammographic imaging but no ultrasound correlate was noted. Her right axilla was negative for adenopathy. 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 Clips could not be placed at the time as a syncopal episode aborted placement. Her prognostic panel showed ER was 5% weak staining, PR negative, HER2 equivocal by IHC, FISH is pending, and her Ki 67 was 10%. She's seen today via MyChart to discuss treatment of her cancer. She's scheduled to see Dr. Donne Hazel and Dr. Lindi Adie tomorrow.    PREVIOUS RADIATION THERAPY: No   PAST MEDICAL HISTORY:  Past Medical History:  Diagnosis Date  . 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 Georgette Dover 2015- was thought to be scar tissue- no growth since that time   . Breast cancer ([place]) 01/23/2020   Right  . Chicken pox   . Generalized headaches    Has had some  migraines in past as well. once a week or less. Usually occipital and associated with sinus pressure as well.   Marland Kitchen Heartburn in pregnancy        PAST SURGICAL HISTORY: Past Surgical History:  Procedure Laterality Date  . CESAREAN SECTION  07/2010   [facility]  . CESAREAN SECTION N/A 12/27/2012   [facility]om[facility] 2nd   . WISDOM TOOTH EXTRACTION       FAMILY HISTORY:  Family History  Problem Relation Age of Onset  . Hypertension Mother   . Breast cancer Mother        early 72's  . Diabetes Father   . Atrial fibrillation Father   . Other Father        biopsies on kidney- noncancerous. stated potentially precancerous  . Prostate cancer Maternal Grandfather      SOCIAL HISTORY:  reports that she has never smoked. She has never used smokeless tobacco. She reports previous alcohol use of about 2.0 - 3.0 standard drinks of alcohol per week. She reports that she does not use drugs. The patient is married and lives in [place]. 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 She has two young children. Her husband is a Engineer, technical sales in our radiation oncology department.    ALLERGIES: Amoxil [amoxicillin]   MEDICATIONS:  Current Outpatient Medications  Medication Sig Dispense Refill  . CANNABIDIOL PO Take 10 mg by mouth.    . COLLAGEN PO Take by mouth.    Marland Kitchen  desogestrel-ethinyl estradiol (MIRCETTE) 0.15-0.02/0.01 MG (21/5) tablet     . escitalopram (LEXAPRO) 5 MG tablet Take 1 tablet (5 mg total) by mouth daily. 90 tablet 3  . Multiple Vitamins-Minerals (WOMENS MULTIVITAMIN PO) Take 1 tablet by mouth daily.     No current facility-administered medications for this encounter.     REVIEW OF SYSTEMS: On review of systems, the patient reports that she is doing well overall. She has been anxious about her diagnosis and is trying to take in all she can about what she's heard so far. No specific complaints are otherwise noted.      PHYSICAL EXAM:  Wt Readings  from Last 3 Encounters:  01/28/20 127 lb (57.6 kg)  01/20/20 127 lb 0.6 oz (57.6 kg)  11/18/19 131 lb (59.4 kg)   Temp Readings from Last 3 Encounters:  01/20/20 97.7 F (36.5 C) (Oral)  11/18/19 97.7 F (36.5 C) (Oral)  01/24/19 97.8 F (36.6 C)   BP Readings from Last 3 Encounters:  01/20/20 121/81  11/18/19 129/82  01/24/19 132/83   Pulse Readings from Last 3 Encounters:  01/20/20 71  11/18/19 69  01/24/19 76    In general this is a well appearing caucasian female in no acute distress. She's alert and oriented x4 and appropriate throughout the examination. Cardiopulmonary assessment is negative for acute distress and she exhibits normal effort. Bilateral breast exam is deferred.   ECOG = 0  0 - Asymptomatic (Fully active, able to carry on all predisease activities without restriction)  1 - Symptomatic but completely ambulatory (Restricted in physically strenuous activity but ambulatory and able to carry out work of a light or sedentary nature. For example, light housework, office work)  2 - Symptomatic, <50% in bed during the day (Ambulatory and capable of all self care but unable to carry out any work activities. Up and about more than 50% of waking hours)  3 - Symptomatic, >50% in bed, but not bedbound (Capable of only limited self-care, confined to bed or chair 50% or more of waking hours)  4 - Bedbound (Completely disabled. Cannot carry on any self-care. Totally confined to bed or chair)  5 - Death   Eustace Pen MM, Creech RH, Tormey DC, et al. [phone]). "Toxicity and response criteria of the [facility]". [place] Oncol. 5 (6): 649-55    LABORATORY DATA:  Lab Results  Component Value Date   WBC 8.3 11/11/2019   HGB 12.1 11/11/2019   HCT 37.1 11/11/2019   MCV 90.9 11/11/2019   PLT 242 11/11/2019   Lab Results  Component Value Date   NA 137 11/18/2019   K 4.4 11/18/2019   CL 101 11/18/2019   CO2 29 11/18/2019   Lab Results   Component Value Date   ALT 13 11/11/2019   AST 21 11/11/2019   ALKPHOS 38 (L) 11/02/2017   BILITOT 0.4 11/11/2019      RADIOGRAPHY: US BREAST LTD UNI RIGHT INC AXILLA  Result Date: 01/15/2020 CLINICAL DATA:  Lump in the right breast. EXAM: DIGITAL DIAGNOSTIC BILATERAL MAMMOGRAM WITH CAD AND TOMO ULTRASOUND RIGHT BREAST COMPARISON:  Previous exam(s). ACR Breast Density Category d: The breast tissue is extremely dense, which lowers the sensitivity of mammography. FINDINGS: There are calcifications in the upper outer right breast in the region of the patient's palpable lump. The calcifications span 2 x 2 cm based on the 90 degree lateral view. There are a few linear forms within these calcifications. Posterior to the calcifications,  there was suggestion of subtle distortion on the MLO view seen on tomosynthesis image 23 of 48. No distortion is seen in this region on a spot MLO view. No other suspicious findings in the right breast. Mammographic images were processed with CAD. On physical exam, there is a lump pointed out by the patient. Targeted ultrasound is performed, showing no focal mass in the right breast. There is dense glandular tissue which does contain calcifications. The tissue is mildly hypoechoic in this region but no discrete masses identified. No other abnormalities are seen sonographically. No axillary adenopathy. IMPRESSION: There are suspicious calcifications spanning 2 x 2 cm in the right breast in the region of the patient's palpable lump. There is no discrete mass in this region seen on ultrasound although the tissue is somewhat hypoechoic. There is also suggestion of distortion posterior to the calcifications on the full paddle MLO view which does not persist on the spot view. RECOMMENDATION: Recommend stereotactic biopsy of the right breast calcifications. Recommend stereotactic biopsy of the possible adjacent distortion if it can be identified at the time. If the biopsied  calcifications demonstrate malignancy but no distortion is seen at the time of stereotactic biopsy, recommend MRI. If the biopsied calcifications are negative for malignancy and no distortion is seen at the time of stereotactic biopsy, recommend breast MRI. If the patient is diagnosed with breast cancer, she will need left-sided mammography. I have discussed the findings and recommendations with the patient. If applicable, a reminder letter will be sent to the patient regarding the next appointment. BI-RADS CATEGORY  4: Suspicious. Electronically Signed   By: Dorise Bullion III M.D   On: 01/15/2020 12:08   MM DIAG BREAST TOMO UNI RIGHT  Result Date: 01/15/2020 CLINICAL DATA:  Lump in the right breast. EXAM: DIGITAL DIAGNOSTIC BILATERAL MAMMOGRAM WITH CAD AND TOMO ULTRASOUND RIGHT BREAST COMPARISON:  Previous exam(s). ACR Breast Density Category d: The breast tissue is extremely dense, which lowers the sensitivity of mammography. FINDINGS: There are calcifications in the upper outer right breast in the region of the patient's palpable lump. The calcifications span 2 x 2 cm based on the 90 degree lateral view. There are a few linear forms within these calcifications. Posterior to the calcifications, there was suggestion of subtle distortion on the MLO view seen on tomosynthesis image 23 of 48. No distortion is seen in this region on a spot MLO view. No other suspicious findings in the right breast. Mammographic images were processed with CAD. On physical exam, there is a lump pointed out by the patient. Targeted ultrasound is performed, showing no focal mass in the right breast. There is dense glandular tissue which does contain calcifications. The tissue is mildly hypoechoic in this region but no discrete masses identified. No other abnormalities are seen sonographically. No axillary adenopathy. IMPRESSION: There are suspicious calcifications spanning 2 x 2 cm in the right breast in the region of the patient's  palpable lump. There is no discrete mass in this region seen on ultrasound although the tissue is somewhat hypoechoic. There is also suggestion of distortion posterior to the calcifications on the full paddle MLO view which does not persist on the spot view. RECOMMENDATION: Recommend stereotactic biopsy of the right breast calcifications. Recommend stereotactic biopsy of the possible adjacent distortion if it can be identified at the time. If the biopsied calcifications demonstrate malignancy but no distortion is seen at the time of stereotactic biopsy, recommend MRI. If the biopsied calcifications are negative for malignancy and no  distortion is seen at the time of stereotactic biopsy, recommend breast MRI. If the patient is diagnosed with breast cancer, she will need left-sided mammography. I have discussed the findings and recommendations with the patient. If applicable, a reminder letter will be sent to the patient regarding the next appointment. BI-RADS CATEGORY  4: Suspicious. Electronically Signed   By: Dorise Bullion III M.D   On: 01/15/2020 12:08   MM CLIP PLACEMENT RIGHT  Result Date: 01/23/2020 CLINICAL DATA:  Post procedure mammogram for clip placement. EXAM: DIAGNOSTIC RIGHT MAMMOGRAM POST STEREOTACTIC BIOPSY COMPARISON:  Previous exam(s). FINDINGS: Mammographic images were obtained following stereotactic guided biopsy of calcifications in the upper outer right breast. There are biopsy site changes in the upper outer quadrant. A biopsy marking clip was not placed because the patient fainted during the biopsy necessitating that the procedure be stopped prior to clip placement. There are multiple residual calcifications that could be targeted for localization. IMPRESSION: There are biopsy site changes in the upper outer right breast without a biopsy marking clip. A clip was not placed at the time of biopsy because the patient fainted during the procedure. There are multiple residual calcifications  that could be localized if necessary. Final Assessment: Post Procedure Mammograms for Marker Placement Electronically Signed   By: Audie Pinto M.D.   On: 01/23/2020 11:54   MM RT BREAST BX W LOC DEV 1ST LESION IMAGE BX SPEC STEREO GUIDE  Result Date: 01/23/2020 CLINICAL DATA:  40 year old female presenting for biopsy of right breast calcifications and possible right breast distortion. EXAM: RIGHT BREAST STEREOTACTIC CORE NEEDLE BIOPSY COMPARISON:  Previous exams. FINDINGS: The patient and I discussed the procedure of stereotactic-guided biopsy including benefits and alternatives. We discussed the high likelihood of a successful procedure. We discussed the risks of the procedure including infection, bleeding, tissue injury, clip migration, and inadequate sampling. Informed written consent was given. The usual time out protocol was performed immediately prior to the procedure. Initial targeting images were obtained and lateral view to evaluate for the possible distortion seen on diagnostic workup in the superior right breast. There was no persistent distortion identified. Therefore we proceeded with biopsy of the right breast calcifications. Using sterile technique and 1% Lidocaine as local anesthetic, under stereotactic guidance, a 9 gauge vacuum assisted device was used to perform core needle biopsy of calcifications in the upper outer right breast using a lateral approach. Specimen radiograph was performed showing at least 3 specimens with calcifications. Specimens with calcifications are identified for pathology. Lesion quadrant: Upper outer quadrant Towards the end of the procedure as the final samples were being taken, the patient briefly fainted, which necessitated removal of the needle prior to a clip being placed. The patient was placed in Trendelenburg position and a cold compress applied to the head, and the patient quickly regained consciousness. The patient's vitals were taken demonstrating a  pulse of 89 and blood pressure of 120/55. The patient was oriented x3. The patient was monitored for approximately 30 minutes and released in good condition. IMPRESSION: 1. Stereotactic-guided biopsy of calcifications in the upper outer right breast. 2. The questioned distortion in the right breast seen on diagnostic workup did not persist on today's imaging. 3. The patient briefly fainted during the biopsy procedure but was treated and released in good condition with normal vital signs and disposition. She was counseled to contact our office with any new concerning symptoms. Electronically Signed   By: Audie Pinto M.D.   On: 01/23/2020 11:59  IMPRESSION/PLAN: 1. At least Stage IA, [id number] grade 2, ER 5% positive, PR negative, HER2 equivocal invasive ductal carcinoma of the right breast. Dr. Lisbeth Renshaw discusses the pathology findings and reviews the nature of right breast disease. Her prognostic panel is still pending with HER2 and  FISH has been requested. 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 Her case is being discussed between Dr. Donne Hazel who will see her tomorrow at 8:30 am, and by Dr. Lindi Adie who will see her tomorrow at 10:00 am. Today we spent time discussing the work up of breast cancer, and that in her case based on her mammographic and ultrasound description, it appears that she needs further imaging to better clarify the size of the cancer and that MRI would be the best modality. She is aware that additional biopsies may be necessary to clarify the extent of her disease as well. While her KI 1 is low, additional sampling may be of benefit once she has her MRI. There is also the possibility that Dr. Lindi Adie may want Mammaprint testing performed on her biopsy specimen to determine a role for systemic therapy. Dr. Lisbeth Renshaw does discuss that if she desires breast conserving surgery, that  radiotherapy would be recommended to reduce risks of local recurrence.  We discussed the risks, benefits, short, and long term effects of radiotherapy, as well as the curative intent. We will follow up with her decision making regarding surgical approach. Dr. Lisbeth Renshaw discusses the delivery and logistics of radiotherapy and anticipates a course of 6 1/2 weeks of radiotherapy if she pursues breast conservation. We will follow along though and see her back depending on her surgical plans.  2. Possible genetic predisposition to malignancy. The patient is a candidate for genetic testing given her personal and family history. She was offered referral and is in agreement. We will coordinate with genetic counseling for STAT referral for this Thursday and have reached out to genetics to update them with our discussion. 3. Claustrophobia and syncope at time of prior biopsy. The patient was offered a prescription for Ativan to be take prior to MRI and prior to any additional biopsies if needed. We reviewed the side effect profile of the medication and I've sent in a new prescrption to her pharmacy.   This encounter was provided by telemedicine platform MyChart.  The patient has provided two factor identification and has given verbal consent for this type of encounter and has been advised to only accept a meeting of this type in a secure network environment. The time spent during this encounter was 60 minutes including preparation, discussion, and coordination of the patient's care. The attendants for this meeting include Blenda Nicely, RN, Dr. Lisbeth Renshaw, Hayden Pedro  and Sydney Herman and her husband Sydney Herman. During the encounter,  Blenda Nicely, RN, Dr. Lisbeth Renshaw, and Hayden Pedro were located at [facility] Radiation Oncology Department.  JOLITA HAEFNER was located at home along with her husband Sydney Herman.    The above documentation reflects my direct findings during this shared patient  visit. Please see the separate note by Dr. Lisbeth Renshaw on this date for the remainder of the patient's plan of care.    Carola Rhine, PAC

## 2020-01-28 NOTE — Telephone Encounter (Signed)
CALLED PATIENT TO INFORM OF GENETICS APPT. ON 01-30-20 @ 4 PM (THIS IS A MY CHART VISIT), LVM FOR A RETURN CALL

## 2020-01-29 ENCOUNTER — Encounter: Payer: Self-pay | Admitting: *Deleted

## 2020-01-29 ENCOUNTER — Inpatient Hospital Stay (HOSPITAL_BASED_OUTPATIENT_CLINIC_OR_DEPARTMENT_OTHER): Payer: 59 | Admitting: Hematology and Oncology

## 2020-01-29 ENCOUNTER — Other Ambulatory Visit: Payer: Self-pay | Admitting: General Surgery

## 2020-01-29 ENCOUNTER — Other Ambulatory Visit: Payer: Self-pay | Admitting: Hematology and Oncology

## 2020-01-29 ENCOUNTER — Inpatient Hospital Stay: Payer: 59

## 2020-01-29 ENCOUNTER — Encounter: Payer: Self-pay | Admitting: Osteopathic Medicine

## 2020-01-29 ENCOUNTER — Telehealth: Payer: Self-pay | Admitting: *Deleted

## 2020-01-29 ENCOUNTER — Encounter: Payer: Self-pay | Admitting: Physical Therapy

## 2020-01-29 ENCOUNTER — Encounter: Payer: Self-pay | Admitting: Licensed Clinical Social Worker

## 2020-01-29 ENCOUNTER — Ambulatory Visit: Payer: 59 | Attending: Hematology and Oncology | Admitting: Physical Therapy

## 2020-01-29 ENCOUNTER — Other Ambulatory Visit: Payer: Self-pay | Admitting: *Deleted

## 2020-01-29 DIAGNOSIS — C50411 Malignant neoplasm of upper-outer quadrant of right female breast: Secondary | ICD-10-CM

## 2020-01-29 DIAGNOSIS — Z5189 Encounter for other specified aftercare: Secondary | ICD-10-CM | POA: Insufficient documentation

## 2020-01-29 DIAGNOSIS — Z833 Family history of diabetes mellitus: Secondary | ICD-10-CM | POA: Diagnosis not present

## 2020-01-29 DIAGNOSIS — Z171 Estrogen receptor negative status [ER-]: Secondary | ICD-10-CM | POA: Insufficient documentation

## 2020-01-29 DIAGNOSIS — Z5111 Encounter for antineoplastic chemotherapy: Secondary | ICD-10-CM | POA: Insufficient documentation

## 2020-01-29 DIAGNOSIS — Z79899 Other long term (current) drug therapy: Secondary | ICD-10-CM

## 2020-01-29 DIAGNOSIS — Z170 Estrogen receptor positive status [ER+]: Secondary | ICD-10-CM | POA: Diagnosis not present

## 2020-01-29 DIAGNOSIS — Z7289 Other problems related to lifestyle: Secondary | ICD-10-CM

## 2020-01-29 DIAGNOSIS — Z8042 Family history of malignant neoplasm of prostate: Secondary | ICD-10-CM

## 2020-01-29 DIAGNOSIS — Z8249 Family history of ischemic heart disease and other diseases of the circulatory system: Secondary | ICD-10-CM | POA: Insufficient documentation

## 2020-01-29 DIAGNOSIS — Z5112 Encounter for antineoplastic immunotherapy: Secondary | ICD-10-CM | POA: Insufficient documentation

## 2020-01-29 DIAGNOSIS — Z803 Family history of malignant neoplasm of breast: Secondary | ICD-10-CM | POA: Insufficient documentation

## 2020-01-29 LAB — GENETIC SCREENING ORDER

## 2020-01-29 MED ORDER — PROCHLORPERAZINE MALEATE 10 MG PO TABS: 10.0000 mg | ORAL_TABLET | Freq: Four times a day (QID) | ORAL | 1 refills | Status: DC | PRN

## 2020-01-29 MED ORDER — LIDOCAINE-PRILOCAINE 2.5-2.5 % EX CREA: TOPICAL_CREAM | CUTANEOUS | 3 refills | Status: DC

## 2020-01-29 MED ORDER — ONDANSETRON HCL 8 MG PO TABS: 8.0000 mg | ORAL_TABLET | Freq: Two times a day (BID) | ORAL | 1 refills | Status: DC | PRN

## 2020-01-29 MED ORDER — LORAZEPAM 0.5 MG PO TABS: 0.5000 mg | ORAL_TABLET | Freq: Every evening | ORAL | 0 refills | Status: DC | PRN

## 2020-01-29 MED FILL — ONDANSETRON HCL 8 MG TABLET: 8 | 15 days supply | Qty: 30 | Fill #0

## 2020-01-29 MED FILL — LIDOCAINE-PRILOCAINE CREAM: 2.5-2.5 | 30 days supply | Qty: 30 | Fill #0

## 2020-01-29 MED FILL — PROCHLORPERAZINE 10 MG TAB: 10 | 7 days supply | Qty: 30 | Fill #0

## 2020-01-29 NOTE — Telephone Encounter (Signed)
Left vm with future appts for MRI, echo and chemo class. Contact information provided for questions or needs

## 2020-01-29 NOTE — Progress Notes (Signed)
[place] Work  Initial Assessment   Sydney Herman is a 40 y.o. year old female presenting alone. Clinical Social Work was referred by distress screen for assessment of psychosocial needs.   SDOH (Social Determinants of Health) assessments performed: Yes SDOH Interventions   Flowsheet Row Most Recent Value  SDOH Interventions   Food Insecurity Interventions Intervention Not Indicated  Financial Strain Interventions Intervention Not Indicated  Transportation Interventions Intervention Not Indicated      Distress Screen completed: Yes ONCBCN DISTRESS SCREENING 01/28/2020  Screening Type Initial Screening  Distress experienced in past week (1-10) 9  Emotional problem type Nervousness/Anxiety;Adjusting to illness  Information Concerns Type Lack of info about diagnosis;Lack of info about treatment  Other Contact via phone      Family/Social Information:  . Housing Arrangement: patient lives with husband, two children (77 & 42 yo) . Family members/support persons in your life? Family and Friends (in particular, 3 local families that are very close) . Transportation concerns: no  . 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 Income source: Employment and husband's employment (works for [employer]) . Financial concerns: No o Type of concern: None . Food access concerns: no . Religious or spiritual practice: yes . Medication Concerns: no  . Services Currently in place:  Anxiety medication from PCP; good support from friends  Coping/ Adjustment to diagnosis: . Patient understands treatment plan and what happens next? yes, information and appointments are happening very quickly, but patient understands next steps . Concerns about diagnosis and/or treatment: Overwhelmed by information and general worries around diagnosis and treatment . Patient reported stressors: Anxiety and Adjusting to my illness . Patient enjoys exercise, reading and time with  family/ friends . Current coping skills/ strengths: Average or above average intelligence, Capable of independent living, Communication skills, Financial means, Motivation for treatment/growth, Special hobby/interest and Supportive family/friends    SUMMARY: Current SDOH Barriers:  . No significant SDOH barriers noted today  Clinical Social Work Clinical Goal(s):  Marland Kitchen Patient will continue to utilize her support system and meet basic needs (eating, sleep)  Interventions: . Discussed common feeling and emotions when being diagnosed with cancer, and the importance of support during treatment . Informed patient of the support team roles and support services at [facility] . Provided CSW contact information and encouraged patient to call with any questions or concerns   Follow Up Plan: Patient will contact CSW with any support or resource needs Patient verbalizes understanding of plan: Yes    Christeen Douglas , LCSW

## 2020-01-29 NOTE — Assessment & Plan Note (Addendum)
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 After reviewing the pathology in detail, we proceeded to discuss the different treatment options between surgery, radiation, chemotherapy, antiestrogen therapies.  Treatment plan: 1. Neo- adjuvant chemotherapy with dose dense Adriamycin and Cytoxan followed by Taxol and carboplatin 2. Breast conserving surgery with sentinel lymph node biopsy 3. Adjuvant radiation 4. followed by adjuvant antiestrogen therapy 5.  Genetic testing  Counseling: I discussed with the patient that the tumor effectively is triple negative given the weak low ER positivity.  Given the high risk of distant recurrence for these types of tumors systemic chemotherapy is warranted.  Chemotherapy Counseling: I discussed the risks and benefits of chemotherapy including the risks of nausea/ vomiting, risk of infection from low WBC count, fatigue due to chemo or anemia, bruising or bleeding due to low platelets, mouth sores, loss/ change in taste and decreased appetite. Liver and kidney function will be monitored through out chemotherapy as abnormalities in liver and kidney function may be a side effect of treatment. Cardiac dysfunction due to Adriamycin and neuropathy risk with Taxol were discussed in detail. Risk of permanent bone marrow dysfunction and leukemia due to chemo were also discussed.  [place] [address]: Treatment of refractory nausea.  After first cycle of chemo if patient experience chemo induced nausea and vomiting the randomized from cycle 2 to Aloxi plus Dex plus olanzapine or placebo plus  Compazine or placebo plus placebo prior to chemo and take home medications for day 2   Dex plus olanzapine or placebo and Compazine or placebo every 8 hours.  If patient does not have nausea after cycle 1, then the trial is complete.  Return to clinic in 1 week to start chemo.

## 2020-01-29 NOTE — Therapy (Signed)
[place], [place], [address] Phone: [phone]   Fax:  [phone]  Physical Therapy Evaluation  Patient Details  Name: Sydney Herman MRN: [id number] Date of Birth: 09/30/1980 Referring Provider (PT): Dr. Nicholas Lose   Encounter Date: 01/29/2020   PT End of Session - 01/29/20 1603    Visit Number 1    Number of Visits 2    Date for PT Re-Evaluation 07/28/20    PT Start Time 1125    PT Stop Time 1200    PT Time Calculation (min) 35 min    Activity Tolerance Patient tolerated treatment well    Behavior During Therapy [facility] for tasks assessed/performed           Past Medical History:  Diagnosis Date  . 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 Georgette Dover 2015- was thought to be scar tissue- no growth since that time   . Breast cancer ([place]) 01/23/2020   Right  . Chicken pox   . Generalized headaches    Has had some migraines in past as well. once a week or less. Usually occipital and associated with sinus pressure as well.   Marland Kitchen Heartburn in pregnancy     Past Surgical History:  Procedure Laterality Date  . CESAREAN SECTION  07/2010   [facility]  . CESAREAN SECTION N/A 12/27/2012   womens 2nd   . WISDOM TOOTH EXTRACTION      There were no vitals filed for this visit.    Subjective Assessment - 01/29/20 1547    Subjective Patient reports she is at the cancer center today to meet with her team for her newly diagnosed right breast cancer.    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 ER positive was weak staining and only 5%. The mass measures 2 cm and is located in the upper outer quadrant with a Ki67 of 10%.    Patient Stated Goals Reduce lymphedema risk and learn post op shoulder ROM HEP    Currently in Pain? No/denies               [facility] PT Assessment - 01/29/20 0001      Assessment   Medical Diagnosis Right breast cancer    Referring Provider (PT) Dr. Nicholas Lose    Onset Date/Surgical Date 01/27/20    Hand Dominance Right    Prior Therapy none      Precautions   Precautions Other (comment)    Precaution Comments active cancer      Restrictions   Weight Bearing Restrictions No      Balance Screen   Has the patient fallen in the past 6 months No    Has the patient had a decrease in activity level because of a fear of falling?  No    Is the patient reluctant to leave their home because of a fear of falling?  No      Home Environment   Living Environment Private residence    Living Arrangements Spouse/significant other;Children   Husband, 89 and 77 y.o. 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    Leisure She does [employer] [place], strength, and walks  5x.week      Cognition   Overall Cognitive Status Within Functional Limits for tasks assessed      Posture/Postural Control   Posture/Postural Control No significant limitations      ROM / Strength   AROM / PROM / Strength AROM;Strength      AROM   Overall AROM Comments Cervical AROM is WNL    AROM Assessment Site Shoulder    Right/Left Shoulder Right;Left    Right Shoulder Extension 50 Degrees    Right Shoulder Flexion 151 Degrees    Right Shoulder ABduction 172 Degrees    Right Shoulder Internal Rotation 66 Degrees    Right Shoulder External Rotation 83 Degrees    Left Shoulder Extension 44 Degrees    Left Shoulder Flexion 154 Degrees    Left Shoulder ABduction 166 Degrees    Left Shoulder Internal Rotation 70 Degrees    Left Shoulder External Rotation 88 Degrees      Strength   Overall Strength Within functional limits for tasks performed             LYMPHEDEMA/ONCOLOGY QUESTIONNAIRE - 01/29/20  0001      Type   Cancer Type Right breast cancer      Lymphedema Assessments   Lymphedema Assessments Upper extremities      Right Upper Extremity Lymphedema   10 cm Proximal to Olecranon Process 25.8 cm    Olecranon Process 22.3 cm    10 cm Proximal to Ulnar Styloid Process 21.6 cm    Just Proximal to Ulnar Styloid Process 14.2 cm    Across Hand at [employer] 18.3 cm    At [place] of 2nd Digit 5.8 cm      Left Upper Extremity Lymphedema   10 cm Proximal to Olecranon Process 27.1 cm    Olecranon Process 22.5 cm    10 cm Proximal to Ulnar Styloid Process 21.7 cm    Just Proximal to Ulnar Styloid Process 14 cm    Across Hand at [employer] 17.9 cm    At [place] of 2nd Digit 5.6 cm           L-DEX FLOWSHEETS - 01/29/20 1600      L-DEX LYMPHEDEMA SCREENING   Measurement Type Unilateral    L-DEX MEASUREMENT EXTREMITY Upper Extremity    POSITION  Standing    DOMINANT SIDE Right    At Risk Side Right    BASELINE SCORE (UNILATERAL) -2.8           The patient was assessed using the L-Dex machine today to produce a lymphedema index baseline score. The patient will be reassessed on a regular basis (typically every 3 months) to obtain new L-Dex scores. If the score is > 6.5 points away from his/her baseline score indicating onset of subclinical lymphedema, it will be recommended to wear a compression garment for 4 weeks, 12 hours per day and then be reassessed. If the score continues to be > 6.5 points from baseline at reassessment, we will initiate lymphedema treatment. Assessing in this manner has a 95% rate of preventing clinically significant lymphedema.      Katina Dung - 01/29/20 0001    Open a tight or new jar No difficulty    Do heavy household chores (wash walls, wash floors) No difficulty    Carry a shopping bag or briefcase No difficulty    Wash your back No difficulty    Use a knife to cut food No difficulty  Recreational activities in which you take some force  or impact through your arm, shoulder, or hand (golf, hammering, tennis) No difficulty    During the past week, to what extent has your arm, shoulder or hand problem interfered with your normal social activities with family, friends, neighbors, or groups? Not at all    During the past week, to what extent has your arm, shoulder or hand problem limited your work or other regular daily activities Not at all    Arm, shoulder, or hand pain. None    Tingling (pins and needles) in your arm, shoulder, or hand None    Difficulty Sleeping No difficulty    DASH Score 0 %            Objective measurements completed on examination: See above findings.         Patient was instructed today in a home exercise program today for post op shoulder range of motion. These included active assist shoulder flexion in sitting, scapular retraction, wall walking with shoulder abduction, and hands behind head external rotation.  She was encouraged to do these twice a day, holding 3 seconds and repeating 5 times when permitted by her physician.          PT Education - 01/29/20 1601    Education Details Lymphedema risk reduction and post op shoulder ROM HEP    Person(s) Educated Patient;Spouse    Methods Demonstration;Explanation;Handout    Comprehension Returned demonstration;Verbalized understanding               PT Long Term Goals - 01/29/20 1607      PT LONG TERM GOAL #1   Title Patient will demonstrate she has regained full shoulder ROM and function post operativley compared to baselines.    Time 6    Period Months    Status New    Target Date 07/28/20           Breast Clinic Goals - 01/29/20 1606      Patient will be able to verbalize understanding of pertinent lymphedema risk reduction practices relevant to her diagnosis specifically related to skin care.   Time 1    Period Days    Status Achieved      Patient will be able to return demonstrate and/or verbalize understanding of  the post-op home exercise program related to regaining shoulder range of motion.   Time 1    Period Days    Status Achieved      Patient will be able to verbalize understanding of the importance of attending the postoperative After Breast Cancer Class for further lymphedema risk reduction education and therapeutic exercise.   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 ER positive was weak staining and only 5%. The mass measures 2 cm and is located in the upper outer quadrant with a Ki67 of 10%. Her medical team to determine a recommended treatment plan. She is planning to have neoadjuvant chemotherapy followed by a right lumpectomy or mastectomy with a sentinel node biopsy, radiation if lumpectomy, and anti-estrogen therapy.    Stability/Clinical Decision Making Stable/Uncomplicated    Clinical Decision Making Low    Rehab Potential Excellent    PT Frequency --  Eval and 1 f/u visit   PT Treatment/Interventions ADLs/Self Care Home Management;Therapeutic exercise;Patient/family education    PT Next Visit Plan Will reassess 3-4 weeks post op    PT Home Exercise Plan Post op shoulder ROM HEP    Consulted and Agree with Plan of Care Patient;Family member/caregiver    Family Member Consulted Husband           Patient will benefit from skilled therapeutic intervention in order to improve the following deficits and impairments:  Postural dysfunction,Decreased range of motion,Impaired UE functional use,Pain,Decreased knowledge of precautions  Visit Diagnosis: Malignant neoplasm of upper-outer quadrant of right breast in female, estrogen receptor positive ([place]) - Plan: PT plan of care cert/re-cert   Patient will follow up at outpatient cancer rehab 3-4 weeks following surgery.  If the patient  requires physical therapy at that time, a specific plan will be dictated and sent to the referring physician for approval. The patient was educated today on appropriate basic range of motion exercises to begin post operatively and the importance of attending the After Breast Cancer class following surgery.  Patient was educated today on lymphedema risk reduction practices as it pertains to recommendations that will benefit the patient immediately following surgery.  She verbalized good understanding.      Problem List Patient Active Problem List   Diagnosis Date Noted  . Malignant neoplasm of upper outer quadrant of female breast ([place]) 01/28/2020  . History of abnormal cervical Pap smear 11/16/2018  . Family history of type 2 diabetes mellitus in father 11/16/2018  . Family history of breast cancer in mother 11/16/2018  . Mild hyperlipidemia 11/02/2017  . Vitamin B12 deficiency 02/20/2017  . Generalized headaches    Annia Friendly, [place] 01/29/20 4:09 PM  [place], [place], [address] Phone: [phone]   Fax:  [phone]  Name: Sydney Herman MRN: [id number] Date of Birth: 1980/10/14

## 2020-01-29 NOTE — Progress Notes (Signed)
START ON PATHWAY REGIMEN - Breast     Cycles 1 through 4: A cycle is every 21 days:     Pembrolizumab      Paclitaxel      Carboplatin      Filgrastim-xxxx    Cycles 5 through 8: A cycle is every 21 days:     Pembrolizumab      Doxorubicin      Cyclophosphamide      Pegfilgrastim-xxxx   **Always confirm dose/schedule in your pharmacy ordering system**  Patient Characteristics: Preoperative or Nonsurgical Candidate (Clinical Staging), Neoadjuvant Therapy followed by Surgery, Invasive Disease, Chemotherapy, HER2 Negative/Unknown/Equivocal, ER Negative/Unknown, Platinum Therapy Indicated, High-Risk Disease Present Therapeutic Status: Preoperative or Nonsurgical Candidate (Clinical Staging) AJCC M Category: cM0 AJCC Grade: G2 Breast Surgical Plan: Neoadjuvant Therapy followed by Surgery ER Status: Negative (-) AJCC 8 Stage Grouping: IB HER2 Status: Negative (-) AJCC T Category: cT1c AJCC N Category: cN0 PR Status: Negative (-) Type of Therapy: Platinum Therapy Indicated Intent of Therapy: Curative Intent, Discussed with Patient

## 2020-01-29 NOTE — Patient Instructions (Signed)

## 2020-01-30 ENCOUNTER — Encounter: Payer: Self-pay | Admitting: Radiation Oncology

## 2020-01-30 ENCOUNTER — Inpatient Hospital Stay (HOSPITAL_BASED_OUTPATIENT_CLINIC_OR_DEPARTMENT_OTHER): Payer: 59 | Admitting: Genetic Counselor

## 2020-01-30 ENCOUNTER — Encounter (HOSPITAL_BASED_OUTPATIENT_CLINIC_OR_DEPARTMENT_OTHER): Payer: Self-pay | Admitting: General Surgery

## 2020-01-30 ENCOUNTER — Encounter: Payer: Self-pay | Admitting: *Deleted

## 2020-01-30 ENCOUNTER — Encounter: Payer: Self-pay | Admitting: Genetic Counselor

## 2020-01-30 ENCOUNTER — Telehealth: Payer: Self-pay | Admitting: Hematology and Oncology

## 2020-01-30 ENCOUNTER — Other Ambulatory Visit: Payer: Self-pay

## 2020-01-30 DIAGNOSIS — Z170 Estrogen receptor positive status [ER+]: Secondary | ICD-10-CM | POA: Diagnosis not present

## 2020-01-30 DIAGNOSIS — C50411 Malignant neoplasm of upper-outer quadrant of right female breast: Secondary | ICD-10-CM

## 2020-01-30 DIAGNOSIS — C50419 Malignant neoplasm of upper-outer quadrant of unspecified female breast: Secondary | ICD-10-CM | POA: Diagnosis not present

## 2020-01-30 DIAGNOSIS — Z8049 Family history of malignant neoplasm of other genital organs: Secondary | ICD-10-CM | POA: Diagnosis not present

## 2020-01-30 DIAGNOSIS — Z800 Family history of malignant neoplasm of digestive organs: Secondary | ICD-10-CM

## 2020-01-30 DIAGNOSIS — Z8042 Family history of malignant neoplasm of prostate: Secondary | ICD-10-CM | POA: Diagnosis not present

## 2020-01-30 DIAGNOSIS — Z803 Family history of malignant neoplasm of breast: Secondary | ICD-10-CM

## 2020-01-30 NOTE — Progress Notes (Signed)
REFERRING PROVIDER: Kyung Rudd, MD [address] [place],  [place] [address]  PRIMARY PROVIDER:  Emeterio Reeve, DO  PRIMARY REASON FOR VISIT:  1. Malignant neoplasm of upper-outer quadrant of right breast in female, estrogen receptor positive ([place])   2. Family history of breast cancer in mother   82. Family history of prostate cancer   4. Family history of uterine cancer   5. Family history of esophageal cancer      I connected with Sydney Herman on 01/30/2020 at 4:00 pm EDT by video conference and verified that I am speaking with the correct person using two identifiers.   Patient location: Home Provider location: [facility]  HISTORY OF PRESENT ILLNESS:   Sydney Herman, a 40 y.o. female, was seen for a [place] cancer genetics consultation at the request of Dr. Lisbeth Renshaw due to a personal and family history of cancer.  Sydney Herman presents to clinic today to discuss the possibility of a hereditary predisposition to cancer, genetic testing, and to further clarify her future cancer risks, as well as potential cancer risks for family members.   In January of 2022, at the age of 2, Sydney Herman was diagnosed with invasive ductal carcinoma of the right breast. The tumor is considered triple negative. The treatment plan includes neoadjuvant chemotherapy, surgery, radiation therapy, and antiestrogen therapy.    CANCER HISTORY:  Oncology History  Malignant neoplasm of upper outer quadrant of female breast ([place])  01/28/2020 Initial Diagnosis   Patient palpated a right breast lump. Diagnostic mammogram and US showed calcifications spanning 2.0cm in the right breast. Biopsy showed invasive and in situ carcinoma, grade 2. ER 5% weak, PR 0% negative, HER2 equiv, Ki 10%     01/28/2020 Cancer Staging   Staging form: Breast, AJCC 8th Edition - Clinical stage from 01/28/2020: Stage IA (cT1c, cN0, cM0, G2, ER+, PR-, HER2: Equivocal) - Signed by Nicholas Lose, MD on 01/29/2020   02/05/2020  -  Chemotherapy    Patient is on Treatment Plan: BREAST DOSE DENSE AC Q14D / CARBOPLATIN D1 + PACLITAXEL D1,8,15 Q21D        RISK FACTORS:  Menarche was at age 51.  First live birth at age 30.  OCP use for approximately 19 years.  Ovaries intact: yes.  Hysterectomy: no.  Menopausal status: premenopausal.  HRT use: 0 years. Colonoscopy: no; not examined. Mammogram within the last year: yes. Any excessive radiation exposure in the past: no   Past Medical History:  Diagnosis Date  . 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 Georgette Dover 2015- was thought to be scar tissue- no growth since that time   . Breast cancer ([place]) 01/23/2020   Right  . Chicken pox   . Family history of esophageal cancer   . Family history of prostate cancer   . Family history of uterine cancer   . Generalized headaches    Has had some migraines in past as well. once a week or less. Usually occipital and associated with sinus pressure as well.   Marland Kitchen Heartburn in pregnancy     Past Surgical History:  Procedure Laterality Date  . CESAREAN SECTION  07/2010   [facility]  . CESAREAN SECTION N/A 12/27/2012   womens 2nd   . WISDOM TOOTH EXTRACTION      Social History   Socioeconomic History  . Marital status: Married    Spouse name: Not on file  . Number of children: 2  .  Years of education: Not on file  . Highest education level: Not on file  Occupational History  . Not on file  Tobacco Use  . Smoking status: Never Smoker  . Smokeless tobacco: Never Used  Vaping Use  . Vaping Use: Never used  Substance and Sexual Activity  . Alcohol use: Not Currently    Alcohol/week: 2.0 - 3.0 standard drinks    Types: 2 - 3 Standard drinks or equivalent per week  . Drug use: Never  . Sexual activity: Yes    Partners: Male    Birth control/protection: Pill, Condom  Other Topics Concern  . 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 Difficulty of Paying Living Expenses: Not hard at all  Food Insecurity: No Food Insecurity  . Worried About Charity fundraiser in the Last Year: Never true  . Ran Out of Food in the Last Year: Never true  Transportation Needs: No Transportation Needs  . Lack of Transportation (Medical): No  . Lack of Transportation (Non-Medical): No  Physical Activity: Not on file  Stress: Not on file  Social Connections: Not on file     FAMILY HISTORY:  We obtained a detailed, 4-generation family history.  Significant diagnoses are listed below: Family History  Problem Relation Age of Onset  . Hypertension Mother   . Breast cancer Mother 80       early 69's  . Diabetes Father   . Atrial fibrillation Father   . Other Father        biopsies on kidney- noncancerous. stated potentially precancerous  . Prostate cancer Maternal Grandfather 90  . Endometrial cancer Paternal Grandmother 89  . Esophageal cancer Paternal Uncle        dx early 24s   Sydney Herman has one daughter (age 49) and one son (age 37). She has a brother (age 29) who has two daughters. None of these family members have had cancer.  Sydney Herman mother is 79 and was diagnosed with breast cancer when she was 64. Sydney Herman has two maternal aunts. Her maternal grandmother died at age 40, and her maternal grandfather is alive at age 33 with prostate cancer that was diagnosed when he was 89.   Sydney Herman father is 56 and has not had cancer. She had one paternal aunt and three paternal uncles. One uncle died in his early 42s from esophageal cancer. Her paternal grandmother died  at age 73 and had endometrial cancer diagnosed at age 65. Her paternal grandfather is alive at age 61 and has not had cancer.  Patient's maternal ancestors are of unknown descent, and paternal ancestors are of [place] descent. There is no reported Ashkenazi Jewish ancestry. There is no known consanguinity.  GENETIC COUNSELING ASSESSMENT: Sydney Herman is a 40 y.o. female with a personal history of functionally triple negative breast cancer and a family history of breast cancer, prostate cancer, endometrial cancer, and esophageal cancer, which is somewhat suggestive of a hereditary  cancer syndrome and predisposition to cancer. We, therefore, discussed and recommended the following at today's visit.   DISCUSSION: We discussed that approximately 5-10% of breast cancer is hereditary, with most cases associated with the BRCA1 and BRCA2 genes. There are other genes that can be associated with hereditary breast cancer syndromes. These include ATM, CHEK2, PALB2, etc. We discussed that testing is beneficial for several reasons, including knowing about other cancer risks, identifying potential screening and risk-reduction options that may be appropriate, and to understand if other family members could be at risk for cancer and allow them to undergo genetic testing.  We reviewed the characteristics, features and inheritance patterns of hereditary cancer syndromes. We also discussed genetic testing, including the appropriate family members to test, the process of testing, insurance coverage and turn-around-time for results. We discussed the implications of a negative, positive and/or variant of uncertain significant result. We recommended Sydney Herman pursue genetic testing for the [facility] CustomNext-Cancer + RNAinsight gene panel.   The CustomNext-Cancer+RNAinsight panel offered by [facility] includes sequencing and rearrangement analysis for the following 47 genes:  APC, ATM, AXIN2, BARD1, BMPR1A, BRCA1, BRCA2, BRIP1,  CDH1, CDK4, CDKN2A, CHEK2, DICER1, EPCAM, GREM1, HOXB13, MEN1, MLH1, MSH2, MSH3, MSH6, MUTYH, NBN, NF1, NF2, NTHL1, PALB2, PMS2, POLD1, POLE, PTEN, RAD51C, RAD51D, RECQL, RET, SDHA, SDHAF2, SDHB, SDHC, SDHD, SMAD4, SMARCA4, STK11, TP53, TSC1, TSC2, and VHL.  RNA data is routinely analyzed for use in variant interpretation for all genes.  Based on Sydney Herman's personal and family history of cancer, she meets medical criteria for genetic testing. Despite that she meets criteria, there may still be an out of pocket cost.   PLAN:  After considering the risks, benefits, and limitations, Sydney Herman provided informed consent to pursue genetic testing and the blood sample was sent to [facility] for analysis of the CustomNext-Cancer + RNAinsight panel. Results should be available within approximately two-three weeks' time, at which point they will be disclosed by telephone to Sydney Herman, as will any additional recommendations warranted by these results. Sydney Herman will receive a summary of her genetic counseling visit and a copy of her results once available. This information will also be available in Epic.   Sydney Herman questions were answered to her satisfaction today. Our contact information was provided should additional questions or concerns arise. Thank you for the referral and allowing [place] to share in the care of your patient.   Clint Guy, [place], [facility] Licensed, Certified Dispensing [email or web address] Phone: [phone]  The patient was seen for a total of 30 minutes in face-to-face genetic counseling.  This patient was discussed with Drs. Magrinat, Lindi Adie and/or Burr Medico who agrees with the above.    _______________________________________________________________________ For Office Staff:  Number of people involved in session: 1 Was an Intern/ student involved with case: no

## 2020-01-30 NOTE — Progress Notes (Signed)
Pt requested rx for wig which was submitted via fax to the wig shop she's using. The original will be provided to the patient.

## 2020-01-30 NOTE — Telephone Encounter (Signed)
No 11/9 los, no changes made to pt schedule  

## 2020-01-31 ENCOUNTER — Other Ambulatory Visit (HOSPITAL_COMMUNITY)
Admission: RE | Admit: 2020-01-31 | Discharge: 2020-01-31 | Disposition: A | Discharge status: Home / Self Care | Payer: 59 | Source: Ambulatory Visit | Attending: General Surgery | Admitting: General Surgery

## 2020-01-31 ENCOUNTER — Ambulatory Visit (HOSPITAL_COMMUNITY)
Admission: RE | Admit: 2020-01-31 | Discharge: 2020-01-31 | Disposition: A | Discharge status: Home / Self Care | Payer: 59 | Source: Ambulatory Visit | Attending: Hematology and Oncology | Admitting: Hematology and Oncology

## 2020-01-31 ENCOUNTER — Ambulatory Visit
Admission: RE | Admit: 2020-01-31 | Discharge: 2020-01-31 | Disposition: A | Discharge status: Home / Self Care | Payer: 59 | Source: Ambulatory Visit | Attending: Osteopathic Medicine | Admitting: Osteopathic Medicine

## 2020-01-31 DIAGNOSIS — Z01812 Encounter for preprocedural laboratory examination: Secondary | ICD-10-CM | POA: Insufficient documentation

## 2020-01-31 DIAGNOSIS — Z01818 Encounter for other preprocedural examination: Secondary | ICD-10-CM | POA: Insufficient documentation

## 2020-01-31 DIAGNOSIS — I348 Other nonrheumatic mitral valve disorders: Secondary | ICD-10-CM | POA: Insufficient documentation

## 2020-01-31 DIAGNOSIS — C50911 Malignant neoplasm of unspecified site of right female breast: Secondary | ICD-10-CM

## 2020-01-31 DIAGNOSIS — Z0189 Encounter for other specified special examinations: Secondary | ICD-10-CM | POA: Diagnosis not present

## 2020-01-31 DIAGNOSIS — C50411 Malignant neoplasm of upper-outer quadrant of right female breast: Secondary | ICD-10-CM | POA: Insufficient documentation

## 2020-01-31 DIAGNOSIS — R922 Inconclusive mammogram: Secondary | ICD-10-CM | POA: Diagnosis not present

## 2020-01-31 DIAGNOSIS — Z170 Estrogen receptor positive status [ER+]: Secondary | ICD-10-CM | POA: Insufficient documentation

## 2020-01-31 DIAGNOSIS — Z20822 Contact with and (suspected) exposure to covid-19: Secondary | ICD-10-CM | POA: Insufficient documentation

## 2020-01-31 LAB — SARS CORONAVIRUS 2 (TAT 6-24 HRS): SARS Coronavirus 2: NEGATIVE

## 2020-01-31 NOTE — Progress Notes (Signed)

## 2020-01-31 NOTE — Progress Notes (Signed)
.  The following biosimilar Ziextenzo (pegfilgrastim-bmez) has been selected for use in this patient per insurance requirement.  Henreitta Leber, PharmD

## 2020-01-31 NOTE — Progress Notes (Signed)
  Echocardiogram 2D Echocardiogram has been performed.  [name] 01/31/2020, 3:37 PM

## 2020-02-02 NOTE — Progress Notes (Signed)
.   Pharmacist Chemotherapy Monitoring - Initial Assessment    Anticipated start date: 02/05/20   Regimen:  . Are orders appropriate based on the patient's diagnosis, regimen, and cycle? Yes . Does the plan date match the patient's scheduled date? Yes . Is the sequencing of drugs appropriate? Yes . Are the premedications appropriate for the patient's regimen? Yes . Prior Authorization for treatment is: Pending o If applicable, is the correct biosimilar selected based on the patient's insurance? yes  Organ Function and Labs: Marland Kitchen Are dose adjustments needed based on the patient's renal function, hepatic function, or hematologic function? No . Are appropriate labs ordered prior to the start of patient's treatment? Yes . Other organ system assessment, if indicated: anthracyclines: Echo/ MUGA . The following baseline labs, if indicated, have been ordered: pembrolizumab: baseline TSH +/- T4  Dose Assessment: . Are the drug doses appropriate? Yes . Are the following correct: o Drug concentrations Yes o IV fluid compatible with drug Yes o Administration routes Yes o Timing of therapy Yes . If applicable, does the patient have documented access for treatment and/or plans for port-a-cath placement? yes . If applicable, have lifetime cumulative doses been properly documented and assessed? yes Lifetime Dose Tracking  No doses have been documented on this patient for the following tracked chemicals: Doxorubicin, Epirubicin, Idarubicin, Daunorubicin, Mitoxantrone, Bleomycin, Oxaliplatin, Carboplatin, Liposomal Doxorubicin  o   Toxicity Monitoring/Prevention: . The patient has the following take home antiemetics prescribed: Ondansetron and Prochlorperazine . The patient has the following take home medications prescribed: N/A . Medication allergies and previous infusion related reactions, if applicable, have been reviewed and addressed. Yes . The patient's current medication list has been assessed for  drug-drug interactions with their chemotherapy regimen. no significant drug-drug interactions were identified on review.  Order Review: . Are the treatment plan orders signed? Yes . Is the patient scheduled to see a provider prior to their treatment? No  I verify that I have reviewed each item in the above checklist and answered each question accordingly.  Wynona Neat 02/02/2020 11:04 AM

## 2020-02-03 ENCOUNTER — Telehealth: Payer: Self-pay | Admitting: Radiation Oncology

## 2020-02-03 ENCOUNTER — Other Ambulatory Visit: Payer: Self-pay | Admitting: *Deleted

## 2020-02-03 ENCOUNTER — Ambulatory Visit
Admission: RE | Admit: 2020-02-03 | Discharge: 2020-02-03 | Disposition: A | Discharge status: Home / Self Care | Payer: 59 | Source: Ambulatory Visit | Attending: Radiation Oncology | Admitting: Radiation Oncology

## 2020-02-03 ENCOUNTER — Encounter: Payer: Self-pay | Admitting: *Deleted

## 2020-02-03 ENCOUNTER — Inpatient Hospital Stay: Payer: 59

## 2020-02-03 ENCOUNTER — Other Ambulatory Visit: Payer: Self-pay

## 2020-02-03 DIAGNOSIS — C50411 Malignant neoplasm of upper-outer quadrant of right female breast: Secondary | ICD-10-CM | POA: Diagnosis not present

## 2020-02-03 DIAGNOSIS — Z170 Estrogen receptor positive status [ER+]: Secondary | ICD-10-CM | POA: Diagnosis not present

## 2020-02-03 DIAGNOSIS — D0511 Intraductal carcinoma in situ of right breast: Secondary | ICD-10-CM | POA: Diagnosis not present

## 2020-02-03 LAB — CBC WITH DIFFERENTIAL (CANCER CENTER ONLY)
Abs Immature Granulocytes: 0.02 10*3/uL (ref 0.00–0.07)
Basophils Absolute: 0 10*3/uL (ref 0.0–0.1)
Basophils Relative: 0 %
Eosinophils Absolute: 0 10*3/uL (ref 0.0–0.5)
Eosinophils Relative: 0 %
HCT: 40.5 % (ref 36.0–46.0)
Hemoglobin: 13.6 g/dL (ref 12.0–15.0)
Immature Granulocytes: 0 %
Lymphocytes Relative: 13 %
Lymphs Abs: 1.3 10*3/uL (ref 0.7–4.0)
MCH: 29.6 pg (ref 26.0–34.0)
MCHC: 33.6 g/dL (ref 30.0–36.0)
MCV: 88.2 fL (ref 80.0–100.0)
Monocytes Absolute: 0.6 10*3/uL (ref 0.1–1.0)
Monocytes Relative: 6 %
Neutro Abs: 7.9 10*3/uL — ABNORMAL HIGH (ref 1.7–7.7)
Neutrophils Relative %: 81 %
Platelet Count: 254 10*3/uL (ref 150–400)
RBC: 4.59 MIL/uL (ref 3.87–5.11)
RDW: 12.9 % (ref 11.5–15.5)
WBC Count: 9.9 10*3/uL (ref 4.0–10.5)
nRBC: 0 % (ref 0.0–0.2)

## 2020-02-03 LAB — CMP (CANCER CENTER ONLY)
ALT: 18 U/L (ref 0–44)
AST: 27 U/L (ref 15–41)
Albumin: 4.2 g/dL (ref 3.5–5.0)
Alkaline Phosphatase: 50 U/L (ref 38–126)
Anion gap: 9 (ref 5–15)
BUN: 9 mg/dL (ref 6–20)
CO2: 25 mmol/L (ref 22–32)
Calcium: 9.5 mg/dL (ref 8.9–10.3)
Chloride: 104 mmol/L (ref 98–111)
Creatinine: 0.83 mg/dL (ref 0.44–1.00)
GFR, Estimated: >60 mL/min (ref >60)
Glucose, Bld: 90 mg/dL (ref 70–99)
Potassium: 3.7 mmol/L (ref 3.5–5.1)
Sodium: 138 mmol/L (ref 135–145)
Total Bilirubin: 0.2 mg/dL — ABNORMAL LOW (ref 0.3–1.2)
Total Protein: 8.6 g/dL — ABNORMAL HIGH (ref 6.5–8.1)

## 2020-02-03 LAB — TSH: TSH: 0.569 u[IU]/mL (ref 0.308–3.960)

## 2020-02-03 MED ORDER — GADOBUTROL 1 MMOL/ML IV SOLN
6.0000 mL | Freq: Once | INTRAVENOUS | Status: AC | PRN
  Administered 2020-02-03: 6 mL via INTRAVENOUS

## 2020-02-03 NOTE — Telephone Encounter (Signed)
I called the patient to review her MRI results and let her know that Dr. Donne Hazel recommends clip placement, and does not feel that additional biopsy would be needed. She is aware that Dr. Lindi Adie has not weighed in yet, but if anything different is communicated we will let her know and that she should await contact from navigators to coordinate clip placement.

## 2020-02-04 ENCOUNTER — Ambulatory Visit (HOSPITAL_COMMUNITY): Payer: 59

## 2020-02-04 ENCOUNTER — Encounter (HOSPITAL_BASED_OUTPATIENT_CLINIC_OR_DEPARTMENT_OTHER)
Admission: RE | Disposition: A | Discharge status: Home / Self Care | Payer: Self-pay | Source: Home / Self Care | Attending: General Surgery

## 2020-02-04 ENCOUNTER — Telehealth: Payer: Self-pay | Admitting: *Deleted

## 2020-02-04 ENCOUNTER — Ambulatory Visit (HOSPITAL_BASED_OUTPATIENT_CLINIC_OR_DEPARTMENT_OTHER)
Admission: RE | Admit: 2020-02-04 | Discharge: 2020-02-04 | Disposition: A | Discharge status: Home / Self Care | Payer: 59 | Attending: General Surgery | Admitting: General Surgery

## 2020-02-04 ENCOUNTER — Other Ambulatory Visit: Payer: Self-pay | Admitting: General Surgery

## 2020-02-04 ENCOUNTER — Encounter (HOSPITAL_BASED_OUTPATIENT_CLINIC_OR_DEPARTMENT_OTHER): Payer: Self-pay | Admitting: General Surgery

## 2020-02-04 ENCOUNTER — Encounter: Payer: Self-pay | Admitting: *Deleted

## 2020-02-04 ENCOUNTER — Other Ambulatory Visit: Payer: Self-pay

## 2020-02-04 ENCOUNTER — Ambulatory Visit (HOSPITAL_BASED_OUTPATIENT_CLINIC_OR_DEPARTMENT_OTHER): Payer: 59 | Admitting: Anesthesiology

## 2020-02-04 DIAGNOSIS — Z95828 Presence of other vascular implants and grafts: Secondary | ICD-10-CM

## 2020-02-04 DIAGNOSIS — C50911 Malignant neoplasm of unspecified site of right female breast: Secondary | ICD-10-CM | POA: Insufficient documentation

## 2020-02-04 DIAGNOSIS — Z880 Allergy status to penicillin: Secondary | ICD-10-CM | POA: Diagnosis not present

## 2020-02-04 DIAGNOSIS — Z803 Family history of malignant neoplasm of breast: Secondary | ICD-10-CM | POA: Insufficient documentation

## 2020-02-04 DIAGNOSIS — Z452 Encounter for adjustment and management of vascular access device: Secondary | ICD-10-CM | POA: Diagnosis not present

## 2020-02-04 DIAGNOSIS — Z170 Estrogen receptor positive status [ER+]: Secondary | ICD-10-CM | POA: Diagnosis not present

## 2020-02-04 DIAGNOSIS — C50411 Malignant neoplasm of upper-outer quadrant of right female breast: Secondary | ICD-10-CM | POA: Diagnosis not present

## 2020-02-04 DIAGNOSIS — E782 Mixed hyperlipidemia: Secondary | ICD-10-CM | POA: Diagnosis not present

## 2020-02-04 DIAGNOSIS — E538 Deficiency of other specified B group vitamins: Secondary | ICD-10-CM | POA: Diagnosis not present

## 2020-02-04 DIAGNOSIS — Z8042 Family history of malignant neoplasm of prostate: Secondary | ICD-10-CM | POA: Diagnosis not present

## 2020-02-04 DIAGNOSIS — R9389 Abnormal findings on diagnostic imaging of other specified body structures: Secondary | ICD-10-CM

## 2020-02-04 HISTORY — PX: PORTACATH PLACEMENT: SHX2246

## 2020-02-04 LAB — POCT PREGNANCY, URINE: Preg Test, Ur: NEGATIVE

## 2020-02-04 LAB — T4: T4, Total: 12.9 ug/dL — ABNORMAL HIGH (ref 4.5–12.0)

## 2020-02-04 SURGERY — INSERTION, TUNNELED CENTRAL VENOUS DEVICE, WITH PORT
Anesthesia: General | Site: Chest | Laterality: Right

## 2020-02-04 MED ORDER — ACETAMINOPHEN 500 MG PO TABS
1000.0000 mg | ORAL_TABLET | ORAL | Status: AC
  Administered 2020-02-04: 1000 mg via ORAL

## 2020-02-04 MED ORDER — LACTATED RINGERS IV SOLN: INTRAVENOUS | Status: DC

## 2020-02-04 MED ORDER — MIDAZOLAM HCL 2 MG/2ML IJ SOLN
INTRAMUSCULAR | Status: AC
  Filled 2020-02-04: qty 2

## 2020-02-04 MED ORDER — DEXAMETHASONE SODIUM PHOSPHATE 10 MG/ML IJ SOLN
INTRAMUSCULAR | Status: AC
  Filled 2020-02-04: qty 1

## 2020-02-04 MED ORDER — ACETAMINOPHEN 500 MG PO TABS
ORAL_TABLET | ORAL | Status: AC
  Filled 2020-02-04: qty 2

## 2020-02-04 MED ORDER — ONDANSETRON HCL 4 MG/2ML IJ SOLN
INTRAMUSCULAR | Status: DC | PRN
  Administered 2020-02-04: 4 mg via INTRAVENOUS

## 2020-02-04 MED ORDER — CEFAZOLIN SODIUM-DEXTROSE 2-4 GM/100ML-% IV SOLN
INTRAVENOUS | Status: AC
  Filled 2020-02-04: qty 100

## 2020-02-04 MED ORDER — FENTANYL CITRATE (PF) 100 MCG/2ML IJ SOLN
INTRAMUSCULAR | Status: AC
  Filled 2020-02-04: qty 2

## 2020-02-04 MED ORDER — MIDAZOLAM HCL 5 MG/5ML IJ SOLN
INTRAMUSCULAR | Status: DC | PRN
  Administered 2020-02-04: 2 mg via INTRAVENOUS

## 2020-02-04 MED ORDER — ONDANSETRON HCL 4 MG/2ML IJ SOLN
INTRAMUSCULAR | Status: AC
  Filled 2020-02-04: qty 2

## 2020-02-04 MED ORDER — LIDOCAINE 2% (20 MG/ML) 5 ML SYRINGE
INTRAMUSCULAR | Status: AC
  Filled 2020-02-04: qty 5

## 2020-02-04 MED ORDER — CEFAZOLIN SODIUM-DEXTROSE 2-4 GM/100ML-% IV SOLN
2.0000 g | INTRAVENOUS | Status: AC
  Administered 2020-02-04: 2 g via INTRAVENOUS

## 2020-02-04 MED ORDER — FENTANYL CITRATE (PF) 100 MCG/2ML IJ SOLN
INTRAMUSCULAR | Status: DC | PRN
  Administered 2020-02-04: 100 ug via INTRAVENOUS

## 2020-02-04 MED ORDER — HEPARIN (PORCINE) IN NACL 2-0.9 UNITS/ML
INTRAMUSCULAR | Status: AC | PRN
  Administered 2020-02-04: 1 via INTRAVENOUS

## 2020-02-04 MED ORDER — HEPARIN SOD (PORK) LOCK FLUSH 100 UNIT/ML IV SOLN
INTRAVENOUS | Status: DC | PRN
  Administered 2020-02-04: 475 [IU] via INTRAVENOUS

## 2020-02-04 MED ORDER — LIDOCAINE 2% (20 MG/ML) 5 ML SYRINGE
INTRAMUSCULAR | Status: DC | PRN
  Administered 2020-02-04: 60 mg via INTRAVENOUS

## 2020-02-04 MED ORDER — FENTANYL CITRATE (PF) 100 MCG/2ML IJ SOLN: 25.0000 ug | INTRAMUSCULAR | Status: DC | PRN

## 2020-02-04 MED ORDER — BUPIVACAINE HCL (PF) 0.25 % IJ SOLN
INTRAMUSCULAR | Status: DC | PRN
  Administered 2020-02-04: 6 mL

## 2020-02-04 MED ORDER — PROPOFOL 10 MG/ML IV BOLUS
INTRAVENOUS | Status: DC | PRN
  Administered 2020-02-04: 150 mg via INTRAVENOUS

## 2020-02-04 MED ORDER — DEXAMETHASONE SODIUM PHOSPHATE 4 MG/ML IJ SOLN
INTRAMUSCULAR | Status: DC | PRN
  Administered 2020-02-04: 10 mg via INTRAVENOUS

## 2020-02-04 SURGICAL SUPPLY — 40 items
ADH SKN CLS APL DERMABOND .7 (GAUZE/BANDAGES/DRESSINGS) ×1
APL PRP STRL LF DISP 70% ISPRP (MISCELLANEOUS) ×1
APL SKNCLS STERI-STRIP NONHPOA (GAUZE/BANDAGES/DRESSINGS) ×1
BAG DECANTER FOR FLEXI CONT (MISCELLANEOUS) ×2 IMPLANT
BENZOIN TINCTURE PRP APPL 2/3 (GAUZE/BANDAGES/DRESSINGS) ×2 IMPLANT
BLADE SURG 11 STRL SS (BLADE) ×2 IMPLANT
BLADE SURG 15 STRL LF DISP TIS (BLADE) ×1 IMPLANT
BLADE SURG 15 STRL SS (BLADE) ×2
CHLORAPREP W/TINT 26 (MISCELLANEOUS) ×2 IMPLANT
COVER BACK TABLE 60X90IN (DRAPES) ×2 IMPLANT
COVER MAYO STAND STRL (DRAPES) ×2 IMPLANT
COVER PROBE 5X48 (MISCELLANEOUS) ×2
DERMABOND ADVANCED (GAUZE/BANDAGES/DRESSINGS) ×1
DERMABOND ADVANCED .7 DNX12 (GAUZE/BANDAGES/DRESSINGS) ×1 IMPLANT
DRAPE C-ARM 42X72 X-RAY (DRAPES) ×2 IMPLANT
DRAPE LAPAROSCOPIC ABDOMINAL (DRAPES) ×2 IMPLANT
DRAPE UTILITY XL STRL (DRAPES) ×2 IMPLANT
DRSG TEGADERM 4X4.75 (GAUZE/BANDAGES/DRESSINGS) ×4 IMPLANT
ELECT COATED BLADE 2.86 ST (ELECTRODE) ×2 IMPLANT
ELECT REM PT RETURN 9FT ADLT (ELECTROSURGICAL) ×2
ELECTRODE REM PT RTRN 9FT ADLT (ELECTROSURGICAL) ×1 IMPLANT
GAUZE SPONGE 4X4 12PLY STRL LF (GAUZE/BANDAGES/DRESSINGS) ×4 IMPLANT
GLOVE SURG ENC MOIS LTX SZ7 (GLOVE) ×4 IMPLANT
GLOVE SURG UNDER POLY LF SZ7.5 (GLOVE) ×4 IMPLANT
GOWN STRL REUS W/ TWL LRG LVL3 (GOWN DISPOSABLE) ×2 IMPLANT
GOWN STRL REUS W/TWL LRG LVL3 (GOWN DISPOSABLE) ×4
KIT CVR 48X5XPRB PLUP LF (MISCELLANEOUS) ×1 IMPLANT
KIT PORT POWER 8FR ISP CVUE (Port) ×2 IMPLANT
NEEDLE HYPO 25X1 1.5 SAFETY (NEEDLE) ×2 IMPLANT
PACK BASIN DAY SURGERY FS (CUSTOM PROCEDURE TRAY) ×2 IMPLANT
PENCIL SMOKE EVACUATOR (MISCELLANEOUS) ×2 IMPLANT
SLEEVE SCD COMPRESS KNEE MED (MISCELLANEOUS) ×2 IMPLANT
STRIP CLOSURE SKIN 1/2X4 (GAUZE/BANDAGES/DRESSINGS) ×2 IMPLANT
SUT MNCRL AB 4-0 PS2 18 (SUTURE) ×2 IMPLANT
SUT PROLENE 2 0 SH DA (SUTURE) ×2 IMPLANT
SUT VIC AB 3-0 SH 27 (SUTURE) ×2
SUT VIC AB 3-0 SH 27X BRD (SUTURE) ×1 IMPLANT
SYR 5ML LUER SLIP (SYRINGE) ×2 IMPLANT
SYR CONTROL 10ML LL (SYRINGE) ×2 IMPLANT
TOWEL GREEN STERILE FF (TOWEL DISPOSABLE) ×2 IMPLANT

## 2020-02-04 NOTE — Interval H&P Note (Signed)
History and Physical Interval Note:  02/04/2020 1:12 PM  Sydney Herman  has presented today for surgery, with the diagnosis of BREAST CANCER.  The various methods of treatment have been discussed with the patient and family. After consideration of risks, benefits and other options for treatment, the patient has consented to  Procedure(s): INSERTION PORT-A-CATH (N/A) as a surgical intervention.  The patient's history has been reviewed, patient examined, no change in status, stable for surgery.  I have reviewed the patient's chart and labs.  Questions were answered to the patient's satisfaction.     Rolm Bookbinder

## 2020-02-04 NOTE — Telephone Encounter (Signed)
Referral sent to Dr. Iran Planas to discuss reconstruction with surgery after chemo.

## 2020-02-04 NOTE — Discharge Instructions (Signed)
PORT-A-CATH: POST OP INSTRUCTIONS  Always review your discharge instruction sheet given to you by the facility where your surgery was performed.   1. A prescription for pain medication may be given to you upon discharge. Take your pain medication as prescribed, if needed. If narcotic pain medicine is not needed, then you make take acetaminophen (Tylenol) or ibuprofen (Advil) as needed.  No Tylenol until 7:00pm if needed. 2. Take your usually prescribed medications unless otherwise directed. 3. If you need a refill on your pain medication, please contact our office. All narcotic pain medicine now requires a paper prescription.  Phoned in and fax refills are no longer allowed by law.  Prescriptions will not be filled after 5 pm or on weekends.  4. You should follow a light diet for the remainder of the day after your procedure. 5. Most patients will experience some mild swelling and/or bruising in the area of the incision. It may take several days to resolve. 6. It is common to experience some constipation if taking pain medication after surgery. Increasing fluid intake and taking a stool softener (such as Colace) will usually help or prevent this problem from occurring. A mild laxative (Milk of Magnesia or Miralax) should be taken according to package directions if there are no bowel movements after 48 hours.  7. Unless discharge instructions indicate otherwise, you may remove your bandages 48 hours after surgery, and you may shower at that time. You may have steri-strips (small white skin tapes) in place directly over the incision.  These strips should be left on the skin for 7-10 days.  If your surgeon used Dermabond (skin glue) on the incision, you may shower in 24 hours.  The glue will flake off over the next 2-3 weeks.  8. If your port is left accessed at the end of surgery (needle left in port), the dressing cannot get wet and should only by changed by a healthcare professional. When the port  is no longer accessed (when the needle has been removed), follow step 7.   9. ACTIVITIES:  Limit activity involving your arms for the next 72 hours. Do no strenuous exercise or activity for 1 week. You may drive when you are no longer taking prescription pain medication, you can comfortably wear a seatbelt, and you can maneuver your car. 10.You may need to see your doctor in the office for a follow-up appointment.  Please       check with your doctor.  11.When you receive a new Port-a-Cath, you will get a product guide and        ID card.  Please keep them in case you need them.  WHEN TO CALL YOUR DOCTOR [phone]): 1. Fever over 101.0 2. Chills 3. Continued bleeding from incision 4. Increased redness and tenderness at the site 5. Shortness of breath, difficulty breathing   The clinic staff is available to answer your questions during regular business hours. Please don't hesitate to call and ask to speak to one of the nurses or medical assistants for clinical concerns. If you have a medical emergency, go to the nearest emergency room or call 911.  A surgeon from [facility] is always on call at the hospital.     For further information, please visit [email or web address]     Post Anesthesia Home Care Instructions  Activity: Get plenty of rest for the remainder of the day. A responsible individual must stay with you for 24 hours following the procedure.  For the  next 24 hours, DO NOT: -Drive a car -Paediatric nurse -Drink alcoholic beverages -Take any medication unless instructed by your physician -Make any legal decisions or sign important papers.  Meals: Start with liquid foods such as gelatin or soup. Progress to regular foods as tolerated. Avoid greasy, spicy, heavy foods. If nausea and/or vomiting occur, drink only clear liquids until the nausea and/or vomiting subsides. Call your physician if vomiting continues.  Special  Instructions/Symptoms: Your throat may feel dry or sore from the anesthesia or the breathing tube placed in your throat during surgery. If this causes discomfort, gargle with warm salt water. The discomfort should disappear within 24 hours.  If you had a scopolamine patch placed behind your ear for the management of post- operative nausea and/or vomiting:  1. The medication in the patch is effective for 72 hours, after which it should be removed.  Wrap patch in a tissue and discard in the trash. Wash hands thoroughly with soap and water. 2. You may remove the patch earlier than 72 hours if you experience unpleasant side effects which may include dry mouth, dizziness or visual disturbances. 3. Avoid touching the patch. Wash your hands with soap and water after contact with the patch.

## 2020-02-04 NOTE — Anesthesia Preprocedure Evaluation (Addendum)
Anesthesia Evaluation  Patient identified by MRN, date of birth, ID band Patient awake    Reviewed: Allergy & Precautions, H&P , NPO status , Patient's Chart, lab work & pertinent test results  Airway Mallampati: II  TM Distance: >3 FB Neck ROM: Full    Dental no notable dental hx. (+) Teeth Intact, Dental Advisory Given   Pulmonary neg pulmonary ROS,    Pulmonary exam normal breath sounds clear to auscultation       Cardiovascular negative cardio ROS   Rhythm:Regular Rate:Normal     Neuro/Psych  Headaches, negative psych ROS   GI/Hepatic negative GI ROS, Neg liver ROS,   Endo/Other  negative endocrine ROS  Renal/GU negative Renal ROS  negative genitourinary   Musculoskeletal   Abdominal   Peds  Hematology negative hematology ROS (+)   Anesthesia Other Findings   Reproductive/Obstetrics negative OB ROS                            Anesthesia Physical Anesthesia Plan  ASA: II  Anesthesia Plan: General   Post-op Pain Management:    Induction: Intravenous  PONV Risk Score and Plan: 4 or greater and Ondansetron, Dexamethasone and Midazolam  Airway Management Planned: LMA  Additional Equipment:   Intra-op Plan:   Post-operative Plan: Extubation in OR  Informed Consent: I have reviewed the patients History and Physical, chart, labs and discussed the procedure including the risks, benefits and alternatives for the proposed anesthesia with the patient or authorized representative who has indicated his/her understanding and acceptance.     Dental advisory given  Plan Discussed with: CRNA  Anesthesia Plan Comments:         Anesthesia Quick Evaluation

## 2020-02-04 NOTE — Transfer of Care (Signed)
Immediate Anesthesia Transfer of Care Note  Patient: ZARETH RIPPETOE  Procedure(s) Performed: INSERTION PORT-A-CATH WITH ULTRASOUND (Right Chest)  Patient Location: PACU  Anesthesia Type:General  Level of Consciousness: drowsy  Airway & Oxygen Therapy: Patient Spontanous Breathing and Patient connected to face mask oxygen  Post-op Assessment: Report given to RN and Post -op Vital signs reviewed and stable  Post vital signs: Reviewed and stable  Last Vitals:  Vitals Value Taken Time  BP 106/76 02/04/20 1403  Temp    Pulse 66 02/04/20 1404  Resp 13 02/04/20 1404  SpO2 100 % 02/04/20 1404  Vitals shown include unvalidated device data.  Last Pain:  Vitals:   02/04/20 1233  TempSrc: Oral  PainSc: 0-No pain         Complications: No complications documented.

## 2020-02-04 NOTE — Anesthesia Procedure Notes (Signed)
Procedure Name: LMA Insertion Date/Time: 02/04/2020 1:27 PM Performed by: Eulas Post, Monchel Pollitt W, CRNA Pre-anesthesia Checklist: Patient identified, Emergency Drugs available, Suction available and Patient being monitored Patient Re-evaluated:Patient Re-evaluated prior to induction Oxygen Delivery Method: Circle system utilized Preoxygenation: Pre-oxygenation with 100% oxygen Induction Type: IV induction Ventilation: Mask ventilation without difficulty LMA: LMA inserted LMA Size: 4.0 Number of attempts: 1 Placement Confirmation: positive ETCO2 and breath sounds checked- equal and bilateral Tube secured with: Tape Dental Injury: Teeth and Oropharynx as per pre-operative assessment

## 2020-02-04 NOTE — H&P (Signed)
40 yof who lives in [place] with her husband (who is Marketing executive at [employer]) and 2 children (9/7). she has fh of breast cancer in her mom early 74s. she has been undergoing mm due to this. she noted a ruoq mass and then was evaluated. there was no mass by [place] at this site. however in ruoq she has 2x2 cm area of calcs noted. axillary [place] is negative. 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 she has no discharge. she is here today to discuss options   Past Surgical History Illene Regulus, CMA; 01/29/2020 8:52 AM) Cesarean Section - Multiple   Diagnostic Studies History Illene Regulus, CMA; 01/29/2020 8:52 AM) Colonoscopy  never Mammogram  within last year Pap Smear  1-5 years ago  Allergies Illene Regulus, CMA; 01/29/2020 8:53 AM) Amoxicillin *PENICILLINS*   Medication History (Alisha Spillers, CMA; 01/29/2020 8:54 AM) Restasis (0.05% Emulsion, Ophthalmic) Active. Kariva (0.15-0.02/0.01MG  (21/5) Tablet, Oral) Active. Escitalopram Oxalate (5MG  Tablet, Oral) Active. Medications Reconciled  Social History Illene Regulus, CMA; 01/29/2020 8:52 AM) Alcohol use  Occasional alcohol use. Caffeine use  Coffee. No drug use  Tobacco use  Never smoker.  Family History Illene Regulus, [place]; 01/29/2020 8:52 AM) Breast Cancer  Mother. Cancer  Family Members In General. Diabetes Mellitus  Father. Prostate Cancer  Family Members In General.  Pregnancy / Birth History Illene Regulus, [place]; 01/29/2020 8:52 AM) Age at menarche  14 years. Contraceptive History  Oral contraceptives. Gravida  2 Length (months) of breastfeeding  7-12 Maternal age  9-30 Para  2 Regular periods   Other Problems Illene Regulus, CMA; 01/29/2020 8:52 AM) Anxiety Disorder  Breast Cancer  Lump In Breast     Review of Systems (Alisha Spillers CMA; 01/29/2020 8:52 AM) General Not Present- Appetite  Loss, Chills, Fatigue, Fever, Night Sweats, Weight Gain and Weight Loss. Skin Not Present- Change in Wart/Mole, Dryness, Hives, Jaundice, New Lesions, Non-Healing Wounds, Rash and Ulcer. HEENT Present- Seasonal Allergies and Wears glasses/contact lenses. Not Present- Earache, Hearing Loss, Hoarseness, Nose Bleed, Oral Ulcers, Ringing in the Ears, Sinus Pain, Sore Throat, Visual Disturbances and Yellow Eyes. Respiratory Not Present- Bloody sputum, Chronic Cough, Difficulty Breathing, Snoring and Wheezing. Breast Present- Breast Mass. Not Present- Breast Pain, Nipple Discharge and Skin Changes. Cardiovascular Not Present- Chest Pain, Difficulty Breathing Lying Down, Leg Cramps, Palpitations, Rapid Heart Rate, Shortness of Breath and Swelling of Extremities. Gastrointestinal Not Present- Abdominal Pain, Bloating, Bloody Stool, Change in Bowel Habits, Chronic diarrhea, Constipation, Difficulty Swallowing, Excessive gas, Gets full quickly at meals, Hemorrhoids, Indigestion, Nausea, Rectal Pain and Vomiting. Female Genitourinary Not Present- Frequency, Nocturia, Painful Urination, Pelvic Pain and Urgency. Musculoskeletal Not Present- Back Pain, Joint Pain, Joint Stiffness, Muscle Pain, Muscle Weakness and Swelling of Extremities. Neurological Not Present- Decreased Memory, Fainting, Headaches, Numbness, Seizures, Tingling, Tremor, Trouble walking and Weakness. Psychiatric Present- Anxiety. Not Present- Bipolar, Change in Sleep Pattern, Depression, Fearful and Frequent crying. Endocrine Not Present- Cold Intolerance, Excessive Hunger, Hair Changes, Heat Intolerance, Hot flashes and New Diabetes. Hematology Not Present- Blood Thinners, Easy Bruising, Excessive bleeding, Gland problems, HIV and Persistent Infections.  Vitals (Alisha Spillers CMA; 01/29/2020 8:53 AM) 01/29/2020 8:52 AM Weight: 127 lb Height: 61in Body Surface Area: 1.56 m Body Mass Index: 24 kg/m  Pulse: 122 (Regular)  BP:  118/62(Sitting, Left Arm, Standard)       Physical Exam Rolm Bookbinder MD; 01/29/2020 10:04  AM) General Mental Status-Alert. Orientation-Oriented X3.  Breast Nipples-No Discharge. Note: no left breast mass right uoq mass/hematoma measuring 2.5 cm   Lymphatic Head & Neck  General Head & Neck Lymphatics: Bilateral - Description - Normal. Axillary  General Axillary Region: Bilateral - Description - Normal. 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 We discussed all of the different options for treatment for breast cancer including surgery, chemotherapy, radiation therapy, Herceptin, and antiestrogen therapy. Her tumor will either be her 2 pos and functionally a tnbc. I think with size of 2 cm it is best option to do chemo first to shrink but more importantly to allow for prognostic information. we discussed port placement soon. we also discussed lumpectomy vs mastectomy. The final decision will be pending mri and genetics. I think mastectomy is reasonable and at this point is candidate for nsm with reconstruction. I will have her see Dr Iran Planas at some point to discuss this as well. we also discussed sn biopsy at time of surgery also she will see oncology today and I will await that visit

## 2020-02-04 NOTE — Anesthesia Postprocedure Evaluation (Signed)
Anesthesia Post Note  Patient: Sydney Herman  Procedure(s) Performed: INSERTION PORT-A-CATH WITH ULTRASOUND (Right Chest)     Patient location during evaluation: PACU Anesthesia Type: General Level of consciousness: awake and alert Pain management: pain level controlled Vital Signs Assessment: post-procedure vital signs reviewed and stable Respiratory status: spontaneous breathing, nonlabored ventilation and respiratory function stable Cardiovascular status: blood pressure returned to baseline and stable Postop Assessment: no apparent nausea or vomiting Anesthetic complications: no   No complications documented.  Last Vitals:  Vitals:   02/04/20 1415 02/04/20 1430  BP: 109/80 122/84  Pulse: 86 87  Resp: 14 14  Temp:    SpO2: 100% 100%    Last Pain:  Vitals:   02/04/20 1430  TempSrc:   PainSc: 5                  Rasaan Brotherton,W. EDMOND

## 2020-02-04 NOTE — Interval H&P Note (Signed)
History and Physical Interval Note:  02/04/2020 1:11 PM  Sydney Herman  has presented today for surgery, with the diagnosis of BREAST CANCER.  The various methods of treatment have been discussed with the patient and family. After consideration of risks, benefits and other options for treatment, the patient has consented to  Procedure(s): INSERTION PORT-A-CATH (N/A) as a surgical intervention.  The patient's history has been reviewed, patient examined, no change in status, stable for surgery.  I have reviewed the patient's chart and labs.  Questions were answered to the patient's satisfaction.     Rolm Bookbinder

## 2020-02-04 NOTE — Op Note (Signed)
Preoperative diagnosis: Clinical stage II right breast cancer Postoperative diagnosis: Same as above Procedure: Right internal jugular port placement with ultrasound guidance Surgeon: Dr. Serita Grammes Anesthesia: General  Estimated blood loss: minimal Specimens:none Sponge and needle count was correct at completion Drains: None Disposition recovery stable condition  Indications:39 yof who  she has fh of breast cancer in her mom early 75s. she has been undergoing mm due to this. she noted a ruoq mass and then was evaluated. there was no mass by [place] at this site. however in ruoq she has 2x2 cm area of calcs noted. axillary [place] is negative. 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 MRI showed a 4 cm area.  She has seen oncology and we are due to begin primary chemotherapy.   Procedure: After informed consent was obtained she was taken to the OR. She was given antibiotics. SCDs were placed. She was placed under general anesthesia without complication. She was prepped and draped in the standard sterile surgical fashion. A surgical timeout was then performed.  I used the ultrasound to identify the right internal jugular vein. Under ultrasound guidance I then accessed the vein with the needle. I passed the wire. The wire was in the vein both by ultrasound and by fluoroscopy. I then made an incision on her right chest from her prior made a pocket. I tunneled the line between the 2 sites. I then placed the dilator over the wire. I observed this with fluoroscopy to go in the correct position. I then removed the wire. I then passed the line. The peel-away sheath was removed. I pulled the line back to be in the superior vena cava. I then attached the port. I sutured this into place with 2-0 Prolene. I then closed this with 3-0 Vicryl and 4-0 Monocryl. Glue was placed. Final fluoroscopic image showed the port to be in good  position. I then accessed the port and was able to aspirate blood and packed this with heparin. I left this accessed for chemotherapy

## 2020-02-05 ENCOUNTER — Other Ambulatory Visit: Payer: 59

## 2020-02-05 ENCOUNTER — Encounter: Payer: Self-pay | Admitting: *Deleted

## 2020-02-05 ENCOUNTER — Other Ambulatory Visit: Payer: Self-pay

## 2020-02-05 ENCOUNTER — Encounter (HOSPITAL_BASED_OUTPATIENT_CLINIC_OR_DEPARTMENT_OTHER): Payer: Self-pay | Admitting: General Surgery

## 2020-02-05 ENCOUNTER — Other Ambulatory Visit (HOSPITAL_COMMUNITY): Payer: 59

## 2020-02-05 ENCOUNTER — Inpatient Hospital Stay: Payer: 59

## 2020-02-05 VITALS — BP 130/61 | HR 82 | Temp 98.1°F | Resp 18

## 2020-02-05 DIAGNOSIS — C50411 Malignant neoplasm of upper-outer quadrant of right female breast: Secondary | ICD-10-CM | POA: Diagnosis not present

## 2020-02-05 DIAGNOSIS — Z170 Estrogen receptor positive status [ER+]: Secondary | ICD-10-CM | POA: Diagnosis not present

## 2020-02-05 LAB — ECHOCARDIOGRAM COMPLETE
Area-P 1/2: 4.21 cm2
Calc EF: 63.6 %
S' Lateral: 3 cm
Single Plane A2C EF: 65.8 %
Single Plane A4C EF: 59.1 %

## 2020-02-05 MED ORDER — SODIUM CHLORIDE 0.9 % IV SOLN
600.0000 mg/m2 | Freq: Once | INTRAVENOUS | Status: AC
  Administered 2020-02-05: 940 mg via INTRAVENOUS
  Filled 2020-02-05: qty 47

## 2020-02-05 MED ORDER — SODIUM CHLORIDE 0.9 % IV SOLN
Freq: Once | INTRAVENOUS | Status: AC
  Filled 2020-02-05: qty 250

## 2020-02-05 MED ORDER — PALONOSETRON HCL INJECTION 0.25 MG/5ML
0.2500 mg | Freq: Once | INTRAVENOUS | Status: AC
  Administered 2020-02-05: 0.25 mg via INTRAVENOUS

## 2020-02-05 MED ORDER — SODIUM CHLORIDE 0.9 % IV SOLN
200.0000 mg | Freq: Once | INTRAVENOUS | Status: AC
  Administered 2020-02-05: 200 mg via INTRAVENOUS
  Filled 2020-02-05: qty 8

## 2020-02-05 MED ORDER — DOXORUBICIN HCL CHEMO IV INJECTION 2 MG/ML
60.0000 mg/m2 | Freq: Once | INTRAVENOUS | Status: AC
  Administered 2020-02-05: 94 mg via INTRAVENOUS
  Filled 2020-02-05: qty 47

## 2020-02-05 MED ORDER — PALONOSETRON HCL INJECTION 0.25 MG/5ML
INTRAVENOUS | Status: AC
  Filled 2020-02-05: qty 5

## 2020-02-05 MED ORDER — SODIUM CHLORIDE 0.9% FLUSH
10.0000 mL | INTRAVENOUS | Status: DC | PRN
  Administered 2020-02-05: 10 mL
  Filled 2020-02-05: qty 10

## 2020-02-05 MED ORDER — SODIUM CHLORIDE 0.9 % IV SOLN
10.0000 mg | Freq: Once | INTRAVENOUS | Status: AC
  Administered 2020-02-05: 10 mg via INTRAVENOUS
  Filled 2020-02-05: qty 10

## 2020-02-05 MED ORDER — HEPARIN SOD (PORK) LOCK FLUSH 100 UNIT/ML IV SOLN
500.0000 [IU] | Freq: Once | INTRAVENOUS | Status: AC | PRN
  Administered 2020-02-05: 500 [IU]
  Filled 2020-02-05: qty 5

## 2020-02-05 MED ORDER — SODIUM CHLORIDE 0.9 % IV SOLN
150.0000 mg | Freq: Once | INTRAVENOUS | Status: AC
  Administered 2020-02-05: 150 mg via INTRAVENOUS
  Filled 2020-02-05: qty 150

## 2020-02-05 NOTE — Addendum Note (Signed)
Addended by: Tora Kindred on: 02/05/2020 01:43 PM   Modules accepted: Orders

## 2020-02-05 NOTE — Patient Instructions (Signed)
[facility] Discharge Instructions for Patients Receiving Chemotherapy  Today you received the following chemotherapy agents: pembrolizumab/doxorubicin/cyclophosphamide.  To help prevent nausea and vomiting after your treatment, we encourage you to take your nausea medication as directed.   If you develop nausea and vomiting that is not controlled by your nausea medication, call the clinic.   BELOW ARE SYMPTOMS THAT SHOULD BE REPORTED IMMEDIATELY:  *FEVER GREATER THAN 100.5 F  *CHILLS WITH OR WITHOUT FEVER  NAUSEA AND VOMITING THAT IS NOT CONTROLLED WITH YOUR NAUSEA MEDICATION  *UNUSUAL SHORTNESS OF BREATH  *UNUSUAL BRUISING OR BLEEDING  TENDERNESS IN MOUTH AND THROAT WITH OR WITHOUT PRESENCE OF ULCERS  *URINARY PROBLEMS  *BOWEL PROBLEMS  UNUSUAL RASH Items with * indicate a potential emergency and should be followed up as soon as possible.  Feel free to call the clinic should you have any questions or concerns. The clinic phone number is [phone].  Please show the [facility] at check-in to the Emergency Department and triage nurse.  Pembrolizumab injection What is this medicine? PEMBROLIZUMAB (pem broe liz ue mab) is a monoclonal antibody. It is used to treat certain types of cancer. This medicine may be used for other purposes; ask your health care provider or pharmacist if you have questions. COMMON BRAND NAME(S): Keytruda What should I tell my health care provider before I take this medicine? They need to know if you have any of these conditions:  autoimmune diseases like Crohn's disease, ulcerative colitis, or lupus  have had or planning to have an allogeneic stem cell transplant (uses someone else's stem cells)  history of organ transplant  history of chest radiation  nervous system problems like myasthenia gravis or Guillain-Barre syndrome  an unusual or allergic reaction to pembrolizumab, other medicines, foods, dyes, or  preservatives  pregnant or trying to get pregnant  breast-feeding How should I use this medicine? This medicine is for infusion into a vein. It is given by a health care professional in a hospital or clinic setting. A special MedGuide will be given to you before each treatment. Be sure to read this information carefully each time. Talk to your pediatrician regarding the use of this medicine in children. While this drug may be prescribed for children as young as 6 months for selected conditions, precautions do apply. Overdosage: If you think you have taken too much of this medicine contact a poison control center or emergency room at once. NOTE: This medicine is only for you. Do not share this medicine with others. What if I miss a dose? It is important not to miss your dose. Call your doctor or health care professional if you are unable to keep an appointment. What may interact with this medicine? Interactions have not been studied. This list may not describe all possible interactions. Give your health care provider a list of all the medicines, herbs, non-prescription drugs, or dietary supplements you use. Also tell them if you smoke, drink alcohol, or use illegal drugs. Some items may interact with your medicine. What should I watch for while using this medicine? Your condition will be monitored carefully while you are receiving this medicine. You may need blood work done while you are taking this medicine. Do not become pregnant while taking this medicine or for 4 months after stopping it. Women should inform their doctor if they wish to become pregnant or think they might be pregnant. There is a potential for serious side effects to an unborn child. Talk to  your health care professional or pharmacist for more information. Do not breast-feed an infant while taking this medicine or for 4 months after the last dose. What side effects may I notice from receiving this medicine? Side effects that  you should report to your doctor or health care professional as soon as possible:  allergic reactions like skin rash, itching or hives, swelling of the face, lips, or tongue  bloody or black, tarry  breathing problems  changes in vision  chest pain  chills  confusion  constipation  cough  diarrhea  dizziness or feeling faint or lightheaded  fast or irregular heartbeat  fever  flushing  joint pain  low blood counts - this medicine may decrease the number of white blood cells, red blood cells and platelets. You may be at increased risk for infections and bleeding.  muscle pain  muscle weakness  pain, tingling, numbness in the hands or feet  persistent headache  redness, blistering, peeling or loosening of the skin, including inside the mouth  signs and symptoms of high blood sugar such as dizziness; dry mouth; dry skin; fruity breath; nausea; stomach pain; increased hunger or thirst; increased urination  signs and symptoms of kidney injury like trouble passing urine or change in the amount of urine  signs and symptoms of liver injury like dark urine, light-colored stools, loss of appetite, nausea, right upper belly pain, yellowing of the eyes or skin  sweating  swollen lymph nodes  weight loss Side effects that usually do not require medical attention (report to your doctor or health care professional if they continue or are bothersome):  decreased appetite  hair loss  tiredness This list may not describe all possible side effects. Call your doctor for medical advice about side effects. You may report side effects to FDA at 1-800-FDA-1088. Where should I keep my medicine? This drug is given in a hospital or clinic and will not be stored at home. NOTE: This sheet is a summary. It may not cover all possible information. If you have questions about this medicine, talk to your doctor, pharmacist, or health care provider.  2021 Elsevier/Gold Standard  (2018-11-28 21:44:53)  Doxorubicin injection What is this medicine? DOXORUBICIN (dox oh ROO bi sin) is a chemotherapy drug. It is used to treat many kinds of cancer like leukemia, lymphoma, neuroblastoma, sarcoma, and Wilms' tumor. It is also used to treat bladder cancer, breast cancer, lung cancer, ovarian cancer, stomach cancer, and thyroid cancer. This medicine may be used for other purposes; ask your health care provider or pharmacist if you have questions. COMMON BRAND NAME(S): Adriamycin, Adriamycin PFS, Adriamycin RDF, Rubex What should I tell my health care provider before I take this medicine? They need to know if you have any of these conditions:  heart disease  history of low blood counts caused by a medicine  liver disease  recent or ongoing radiation therapy  an unusual or allergic reaction to doxorubicin, other chemotherapy agents, other medicines, foods, dyes, or preservatives  pregnant or trying to get pregnant  breast-feeding How should I use this medicine? This drug is given as an infusion into a vein. It is administered in a hospital or clinic by a specially trained health care professional. If you have pain, swelling, burning or any unusual feeling around the site of your injection, tell your health care professional right away. Talk to your pediatrician regarding the use of this medicine in children. Special care may be needed. Overdosage: If you think you have  taken too much of this medicine contact a poison control center or emergency room at once. NOTE: This medicine is only for you. Do not share this medicine with others. What if I miss a dose? It is important not to miss your dose. Call your doctor or health care professional if you are unable to keep an appointment. What may interact with this medicine? This medicine may interact with the following medications:  6-mercaptopurine  paclitaxel  phenytoin  St. John's Wort  trastuzumab  verapamil This  list may not describe all possible interactions. Give your health care provider a list of all the medicines, herbs, non-prescription drugs, or dietary supplements you use. Also tell them if you smoke, drink alcohol, or use illegal drugs. Some items may interact with your medicine. What should I watch for while using this medicine? This drug may make you feel generally unwell. This is not uncommon, as chemotherapy can affect healthy cells as well as cancer cells. Report any side effects. Continue your course of treatment even though you feel ill unless your doctor tells you to stop. There is a maximum amount of this medicine you should receive throughout your life. The amount depends on the medical condition being treated and your overall health. Your doctor will watch how much of this medicine you receive in your lifetime. Tell your doctor if you have taken this medicine before. You may need blood work done while you are taking this medicine. Your urine may turn red for a few days after your dose. This is not blood. If your urine is dark or brown, call your doctor. In some cases, you may be given additional medicines to help with side effects. Follow all directions for their use. Call your doctor or health care professional for advice if you get a fever, chills or sore throat, or other symptoms of a cold or flu. Do not treat yourself. This drug decreases your body's ability to fight infections. Try to avoid being around people who are sick. This medicine may increase your risk to bruise or bleed. Call your doctor or health care professional if you notice any unusual bleeding. Talk to your doctor about your risk of cancer. You may be more at risk for certain types of cancers if you take this medicine. Do not become pregnant while taking this medicine or for 6 months after stopping it. Women should inform their doctor if they wish to become pregnant or think they might be pregnant. Men should not father a  child while taking this medicine and for 6 months after stopping it. There is a potential for serious side effects to an unborn child. Talk to your health care professional or pharmacist for more information. Do not breast-feed an infant while taking this medicine. This medicine has caused ovarian failure in some women and reduced sperm counts in some men This medicine may interfere with the ability to have a child. Talk with your doctor or health care professional if you are concerned about your fertility. This medicine may cause a decrease in Co-Enzyme Q-10. You should make sure that you get enough Co-Enzyme Q-10 while you are taking this medicine. Discuss the foods you eat and the vitamins you take with your health care professional. What side effects may I notice from receiving this medicine? Side effects that you should report to your doctor or health care professional as soon as possible:  allergic reactions like skin rash, itching or hives, swelling of the face, lips, or tongue  breathing problems  chest pain  fast or irregular heartbeat  low blood counts - this medicine may decrease the number of white blood cells, red blood cells and platelets. You may be at increased risk for infections and bleeding.  pain, redness, or irritation at site where injected  signs of infection - fever or chills, cough, sore throat, pain or difficulty passing urine  signs of decreased platelets or bleeding - bruising, pinpoint red spots on the skin, black, tarry stools, blood in the urine  swelling of the ankles, feet, hands  tiredness  weakness Side effects that usually do not require medical attention (report to your doctor or health care professional if they continue or are bothersome):  diarrhea  hair loss  mouth sores  nail discoloration or damage  nausea  red colored urine  vomiting This list may not describe all possible side effects. Call your doctor for medical advice about side  effects. You may report side effects to FDA at 1-800-FDA-1088. Where should I keep my medicine? This drug is given in a hospital or clinic and will not be stored at home. NOTE: This sheet is a summary. It may not cover all possible information. If you have questions about this medicine, talk to your doctor, pharmacist, or health care provider.  2021 Elsevier/Gold Standard (2016-08-10 11:01:26)  Cyclophosphamide Injection What is this medicine? CYCLOPHOSPHAMIDE (sye kloe FOSS fa mide) is a chemotherapy drug. It slows the growth of cancer cells. This medicine is used to treat many types of cancer like lymphoma, myeloma, leukemia, breast cancer, and ovarian cancer, to name a few. This medicine may be used for other purposes; ask your health care provider or pharmacist if you have questions. COMMON BRAND NAME(S): Cytoxan, Neosar What should I tell my health care provider before I take this medicine? They need to know if you have any of these conditions:  heart disease  history of irregular heartbeat  infection  kidney disease  liver disease  low blood counts, like white cells, platelets, or red blood cells  on hemodialysis  recent or ongoing radiation therapy  scarring or thickening of the lungs  trouble passing urine  an unusual or allergic reaction to cyclophosphamide, other medicines, foods, dyes, or preservatives  pregnant or trying to get pregnant  breast-feeding How should I use this medicine? This drug is usually given as an injection into a vein or muscle or by infusion into a vein. It is administered in a hospital or clinic by a specially trained health care professional. Talk to your pediatrician regarding the use of this medicine in children. Special care may be needed. Overdosage: If you think you have taken too much of this medicine contact a poison control center or emergency room at once. NOTE: This medicine is only for you. Do not share this medicine with  others. What if I miss a dose? It is important not to miss your dose. Call your doctor or health care professional if you are unable to keep an appointment. What may interact with this medicine?  amphotericin B  azathioprine  certain antivirals for HIV or hepatitis  certain medicines for blood pressure, heart disease, irregular heart beat  certain medicines that treat or prevent blood clots like warfarin  certain other medicines for cancer  cyclosporine  etanercept  indomethacin  medicines that relax muscles for surgery  medicines to increase blood counts  metronidazole This list may not describe all possible interactions. Give your health care provider a list of all  the medicines, herbs, non-prescription drugs, or dietary supplements you use. Also tell them if you smoke, drink alcohol, or use illegal drugs. Some items may interact with your medicine. What should I watch for while using this medicine? Your condition will be monitored carefully while you are receiving this medicine. You may need blood work done while you are taking this medicine. Drink water or other fluids as directed. Urinate often, even at night. Some products may contain alcohol. Ask your health care professional if this medicine contains alcohol. Be sure to tell all health care professionals you are taking this medicine. Certain medicines, like metronidazole and disulfiram, can cause an unpleasant reaction when taken with alcohol. The reaction includes flushing, headache, nausea, vomiting, sweating, and increased thirst. The reaction can last from 30 minutes to several hours. Do not become pregnant while taking this medicine or for 1 year after stopping it. Women should inform their health care professional if they wish to become pregnant or think they might be pregnant. Men should not father a child while taking this medicine and for 4 months after stopping it. There is potential for serious side effects to an  unborn child. Talk to your health care professional for more information. Do not breast-feed an infant while taking this medicine or for 1 week after stopping it. This medicine has caused ovarian failure in some women. This medicine may make it more difficult to get pregnant. Talk to your health care professional if you are concerned about your fertility. This medicine has caused decreased sperm counts in some men. This may make it more difficult to father a child. Talk to your health care professional if you are concerned about your fertility. Call your health care professional for advice if you get a fever, chills, or sore throat, or other symptoms of a cold or flu. Do not treat yourself. This medicine decreases your body's ability to fight infections. Try to avoid being around people who are sick. Avoid taking medicines that contain aspirin, acetaminophen, ibuprofen, naproxen, or ketoprofen unless instructed by your health care professional. These medicines may hide a fever. Talk to your health care professional about your risk of cancer. You may be more at risk for certain types of cancer if you take this medicine. If you are going to need surgery or other procedure, tell your health care professional that you are using this medicine. Be careful brushing or flossing your teeth or using a toothpick because you may get an infection or bleed more easily. If you have any dental work done, tell your dentist you are receiving this medicine. What side effects may I notice from receiving this medicine? Side effects that you should report to your doctor or health care professional as soon as possible:  allergic reactions like skin rash, itching or hives, swelling of the face, lips, or tongue  breathing problems  nausea, vomiting  signs and symptoms of bleeding such as bloody or black, tarry stools; red or dark brown urine; spitting up blood or brown material that looks like coffee grounds; red spots on  the skin; unusual bruising or bleeding from the eyes, gums, or nose  signs and symptoms of heart failure like fast, irregular heartbeat, sudden weight gain; swelling of the ankles, feet, hands  signs and symptoms of infection like fever; chills; cough; sore throat; pain or trouble passing urine  signs and symptoms of kidney injury like trouble passing urine or change in the amount of urine  signs and symptoms of liver  injury like dark yellow or brown urine; general ill feeling or flu-like symptoms; light-colored stools; loss of appetite; nausea; right upper belly pain; unusually weak or tired; yellowing of the eyes or skin Side effects that usually do not require medical attention (report to your doctor or health care professional if they continue or are bothersome):  confusion  decreased hearing  diarrhea  facial flushing  hair loss  headache  loss of appetite  missed menstrual periods  signs and symptoms of low red blood cells or anemia such as unusually weak or tired; feeling faint or lightheaded; falls  skin discoloration This list may not describe all possible side effects. Call your doctor for medical advice about side effects. You may report side effects to FDA at 1-800-FDA-1088. Where should I keep my medicine? This drug is given in a hospital or clinic and will not be stored at home. NOTE: This sheet is a summary. It may not cover all possible information. If you have questions about this medicine, talk to your doctor, pharmacist, or health care provider.  2021 Elsevier/Gold Standard (2018-10-01 09:53:29)

## 2020-02-06 ENCOUNTER — Telehealth: Payer: Self-pay | Admitting: *Deleted

## 2020-02-06 ENCOUNTER — Encounter: Payer: Self-pay | Admitting: Hematology and Oncology

## 2020-02-06 NOTE — Progress Notes (Signed)
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  Pt would like to apply so I completed the [employer] enrollmentapplication for Keytruda, once I get the pt's and Dr. Geralyn Flash signature I will fax to the foundation for processing.  I will notify the pt of the outcome once received.  I also completed theonline application for [employer] for Ziextenzo and she was approved for $10,000.  Her OOP for each dose or cycle is $0.  Last by not least, I informed her of the [employer] and went over what it covers but she declined wanting to leave it for someone in greater need.  I will give her my card on 02/07/20 for any questions or concerns she may have in the future.

## 2020-02-07 ENCOUNTER — Inpatient Hospital Stay: Payer: 59

## 2020-02-07 ENCOUNTER — Other Ambulatory Visit: Payer: Self-pay | Admitting: General Surgery

## 2020-02-07 ENCOUNTER — Other Ambulatory Visit: Payer: Self-pay

## 2020-02-07 VITALS — BP 111/76 | HR 82 | Temp 98.2°F | Resp 18

## 2020-02-07 DIAGNOSIS — C50411 Malignant neoplasm of upper-outer quadrant of right female breast: Secondary | ICD-10-CM

## 2020-02-07 DIAGNOSIS — Z170 Estrogen receptor positive status [ER+]: Secondary | ICD-10-CM | POA: Diagnosis not present

## 2020-02-07 DIAGNOSIS — R9389 Abnormal findings on diagnostic imaging of other specified body structures: Secondary | ICD-10-CM

## 2020-02-07 MED ORDER — PEGFILGRASTIM-BMEZ 6 MG/0.6ML ~~LOC~~ SOSY
PREFILLED_SYRINGE | SUBCUTANEOUS | Status: AC
  Filled 2020-02-07: qty 0.6

## 2020-02-07 MED ORDER — PEGFILGRASTIM-BMEZ 6 MG/0.6ML ~~LOC~~ SOSY
6.0000 mg | PREFILLED_SYRINGE | Freq: Once | SUBCUTANEOUS | Status: AC
Start: 2020-02-07 — End: 2020-02-07
  Administered 2020-02-07: 6 mg via SUBCUTANEOUS

## 2020-02-07 NOTE — Patient Instructions (Signed)
Pegfilgrastim injection What is this medicine? PEGFILGRASTIM (PEG fil gra stim) is a long-acting granulocyte colony-stimulating factor that stimulates the growth of neutrophils, a type of white blood cell important in the body's fight against infection. It is used to reduce the incidence of fever and infection in patients with certain types of cancer who are receiving chemotherapy that affects the bone marrow, and to increase survival after being exposed to high doses of radiation. This medicine may be used for other purposes; ask your health care provider or pharmacist if you have questions. COMMON BRAND NAME(S): Fulphila, Neulasta, Nyvepria, UDENYCA, Ziextenzo What should I tell my health care provider before I take this medicine? They need to know if you have any of these conditions:  kidney disease  latex allergy  ongoing radiation therapy  sickle cell disease  skin reactions to acrylic adhesives (On-Body Injector only)  an unusual or allergic reaction to pegfilgrastim, filgrastim, other medicines, foods, dyes, or preservatives  pregnant or trying to get pregnant  breast-feeding How should I use this medicine? This medicine is for injection under the skin. If you get this medicine at home, you will be taught how to prepare and give the pre-filled syringe or how to use the On-body Injector. Refer to the patient Instructions for Use for detailed instructions. Use exactly as directed. Tell your healthcare provider immediately if you suspect that the On-body Injector may not have performed as intended or if you suspect the use of the On-body Injector resulted in a missed or partial dose. It is important that you put your used needles and syringes in a special sharps container. Do not put them in a trash can. If you do not have a sharps container, call your pharmacist or healthcare provider to get one. Talk to your pediatrician regarding the use of this medicine in children. While this drug  may be prescribed for selected conditions, precautions do apply. Overdosage: If you think you have taken too much of this medicine contact a poison control center or emergency room at once. NOTE: This medicine is only for you. Do not share this medicine with others. What if I miss a dose? It is important not to miss your dose. Call your doctor or health care professional if you miss your dose. If you miss a dose due to an On-body Injector failure or leakage, a new dose should be administered as soon as possible using a single prefilled syringe for manual use. What may interact with this medicine? Interactions have not been studied. This list may not describe all possible interactions. Give your health care provider a list of all the medicines, herbs, non-prescription drugs, or dietary supplements you use. Also tell them if you smoke, drink alcohol, or use illegal drugs. Some items may interact with your medicine. What should I watch for while using this medicine? Your condition will be monitored carefully while you are receiving this medicine. You may need blood work done while you are taking this medicine. Talk to your health care provider about your risk of cancer. You may be more at risk for certain types of cancer if you take this medicine. If you are going to need a MRI, CT scan, or other procedure, tell your doctor that you are using this medicine (On-Body Injector only). What side effects may I notice from receiving this medicine? Side effects that you should report to your doctor or health care professional as soon as possible:  allergic reactions (skin rash, itching or hives, swelling of   the face, lips, or tongue)  back pain  dizziness  fever  pain, redness, or irritation at site where injected  pinpoint red spots on the skin  red or dark-brown urine  shortness of breath or breathing problems  stomach or side pain, or pain at the shoulder  swelling  tiredness  trouble  passing urine or change in the amount of urine  unusual bruising or bleeding Side effects that usually do not require medical attention (report to your doctor or health care professional if they continue or are bothersome):  bone pain  muscle pain This list may not describe all possible side effects. Call your doctor for medical advice about side effects. You may report side effects to FDA at 1-800-FDA-1088. Where should I keep my medicine? Keep out of the reach of children. If you are using this medicine at home, you will be instructed on how to store it. Throw away any unused medicine after the expiration date on the label. NOTE: This sheet is a summary. It may not cover all possible information. If you have questions about this medicine, talk to your doctor, pharmacist, or health care provider.  2021 Elsevier/Gold Standard (2019-01-18 13:20:51)  

## 2020-02-10 ENCOUNTER — Other Ambulatory Visit: Payer: 59

## 2020-02-11 ENCOUNTER — Ambulatory Visit
Admission: RE | Admit: 2020-02-11 | Discharge: 2020-02-11 | Disposition: A | Discharge status: Home / Self Care | Payer: 59 | Source: Ambulatory Visit | Attending: General Surgery | Admitting: General Surgery

## 2020-02-11 ENCOUNTER — Other Ambulatory Visit: Payer: Self-pay

## 2020-02-11 DIAGNOSIS — R921 Mammographic calcification found on diagnostic imaging of breast: Secondary | ICD-10-CM | POA: Diagnosis not present

## 2020-02-11 DIAGNOSIS — R9389 Abnormal findings on diagnostic imaging of other specified body structures: Secondary | ICD-10-CM

## 2020-02-12 NOTE — Progress Notes (Signed)
[facility]:    Sydney Reeve, DO [address] [place] [address]   DIAGNOSIS: Cancer Staging Malignant neoplasm of upper outer quadrant of female breast ([place]) Staging form: Breast, AJCC 8th Edition - Clinical stage from 01/28/2020: Stage IA (cT1c, cN0, cM0, G2, ER+, PR-, HER2: Equivocal) - Signed by Nicholas Lose, MD on 01/29/2020 Stage prefix: Initial diagnosis Method of lymph node assessment: Clinical   SUMMARY OF ONCOLOGIC HISTORY: Oncology History  Malignant neoplasm of upper outer quadrant of female breast ([place])  01/28/2020 Initial Diagnosis   Patient palpated a right breast lump. Diagnostic mammogram and US showed calcifications spanning 2.0cm in the right breast. Biopsy showed invasive and in situ carcinoma, grade 2. ER 5% weak, PR 0% negative, HER2 equiv, Ki 10%     01/28/2020 Cancer Staging   Staging form: Breast, AJCC 8th Edition - Clinical stage from 01/28/2020: Stage IA (cT1c, cN0, cM0, G2, ER+, PR-, HER2: Equivocal) - Signed by Nicholas Lose, MD on 01/29/2020   02/05/2020 -  Chemotherapy    Patient is on Treatment Plan: BREAST DOSE DENSE AC Q14D / CARBOPLATIN D1 + PACLITAXEL D1,8,15 Q21D        CURRENT THERAPY: Adriamycin/Cytoxan/Pembrolizumab  INTERVAL HISTORY: Sydney Herman 40 y.o. female returns for evaluation prior to receiving her first cycle of neoadjuvant chemotherapy with Adriamycin/Cytoxan/Pembrolizumab.  Sydney Herman underwent an echocardiogram on 01/31/2020 that showed an EF of 60-65%.  Sydney Herman notes that Sydney Herman was pleasantly surprised with how her chemotherapy went.  Sydney Herman did not have any nausea, or any issues with pain.  Sydney Herman did undergo clip placement last week and notes that her breast is still sore from that.   Sydney Herman had nasal drainage that started about 2 weeks ago, however over the past two days it has increased in frequency.  Sydney Herman is taking claritin daily and mucinex.  Sydney Herman wants to know what else Sydney Herman can do for this.      Patient Active Problem List   Diagnosis Date Noted  . Family history of prostate cancer   . Family history of uterine cancer   . Family history of esophageal cancer   . Malignant neoplasm of upper outer quadrant of female breast ([place]) 01/28/2020  . History of abnormal cervical Pap smear 11/16/2018  . Family history of type 2 diabetes mellitus in father 11/16/2018  . Family history of breast cancer in mother 11/16/2018  . Mild hyperlipidemia 11/02/2017  . Vitamin B12 deficiency 02/20/2017  . Generalized headaches     is allergic to amoxil [amoxicillin].  MEDICAL HISTORY: Past Medical History:  Diagnosis Date  . 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 Georgette Dover 2015- was thought to be scar tissue- no growth since that time   . Breast cancer ([place]) 01/23/2020   Right  . Chicken pox   . Family history of esophageal cancer   . Family history of prostate cancer   . Family history of uterine cancer   . Generalized headaches    Has had some migraines in past as well. once a week or less. Usually occipital and associated with sinus pressure as well.   Marland Kitchen Heartburn in pregnancy     SURGICAL HISTORY: Past Surgical History:  Procedure Laterality Date  . CESAREAN SECTION  07/2010   [facility]  . CESAREAN SECTION N/A 12/27/2012   womens 2nd   . PORTACATH PLACEMENT Right 02/04/2020   Procedure: INSERTION PORT-A-CATH WITH ULTRASOUND;  Surgeon: Donne Hazel,  Rodman Key, MD;  Location: [facility];  Service: General;  Laterality: Right;  . WISDOM TOOTH EXTRACTION      SOCIAL HISTORY: Social History   Socioeconomic History  . Marital status: Married    Spouse name: Not on file  . Number of children: 2  . Years of education: Not on file  . Highest education level: Not on file  Occupational History  . Not on file  Tobacco Use  . Smoking status: Never Smoker  . Smokeless tobacco: Never Used  Vaping Use  . Vaping Use: Never used  Substance and  Sexual Activity  . Alcohol use: Not Currently    Alcohol/week: 2.0 - 3.0 standard drinks    Types: 2 - 3 Standard drinks or equivalent per week  . Drug use: Never  . Sexual activity: Yes    Partners: Male    Birth control/protection: Pill, Condom  Other Topics Concern  . 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 Difficulty of Paying Living Expenses: Not hard at all  Food Insecurity: No Food Insecurity  . Worried About Charity fundraiser in the Last Year: Never true  . Ran Out of Food in the Last Year: Never true  Transportation Needs: No Transportation Needs  . Lack of Transportation (Medical): No  . Lack of Transportation (Non-Medical): No  Physical Activity: Not on file  Stress: Not on file  Social Connections: Not on file  Intimate Partner Violence: Not on file    FAMILY HISTORY: Family History  Problem Relation Age of Onset  . Hypertension Mother   . Breast cancer Mother 21       early 90's  . Diabetes Father   . Atrial fibrillation Father   . Other Father        biopsies on kidney- noncancerous. stated potentially precancerous  . Prostate cancer Maternal Grandfather 90  . Endometrial cancer Paternal Grandmother 89  . Esophageal cancer Paternal Uncle        dx early 75s    Review of Systems  Constitutional: Positive for fatigue. Negative for appetite change, chills, fever and unexpected weight change.  HENT:   Negative for hearing loss, lump/mass, mouth sores, sore throat and trouble swallowing.   Eyes: Negative for eye problems and icterus.   Respiratory: Negative for chest tightness, cough and shortness of breath.   Cardiovascular: Negative for chest pain, leg swelling and palpitations.  Gastrointestinal: Negative for abdominal distention, abdominal pain, constipation, diarrhea, nausea and vomiting.  Endocrine: Negative for hot flashes.  Genitourinary: Negative for difficulty urinating.   Musculoskeletal: Negative for arthralgias.  Skin: Negative for itching and rash.  Neurological: Negative for dizziness, extremity weakness, headaches and numbness.  Hematological: Negative for adenopathy. Does not bruise/bleed easily.  Psychiatric/Behavioral: Negative for depression. The patient is not nervous/anxious.       PHYSICAL EXAMINATION  ECOG PERFORMANCE STATUS: 1 - Symptomatic but completely ambulatory  Vitals:   02/13/20 1030  BP: 120/79  Pulse: 82  Resp: 18  Temp: 97.9 F (36.6 C)  SpO2: 99%    Physical Exam Constitutional:      General: Sydney Herman is not in acute distress.    Appearance: Normal appearance. Sydney Herman is not toxic-appearing.  HENT:     Head: Normocephalic and atraumatic.  Eyes:     General: No scleral icterus. Cardiovascular:     Rate and Rhythm: Normal rate and regular rhythm.     Pulses: Normal pulses.     Heart sounds: Normal heart sounds.  Pulmonary:     Effort: Pulmonary effort is normal.     Breath sounds: Normal breath sounds.     Comments: Right breast s/p clip placement, healing well.  steristrips in place.  Abdominal:     General: Abdomen is flat. Bowel sounds are normal. There is no distension.     Palpations: Abdomen is soft.     Tenderness: There is no abdominal tenderness.  Musculoskeletal:        General: No swelling.     Cervical back: Neck supple.  Lymphadenopathy:     Cervical: No cervical adenopathy.  Skin:    General: Skin is warm and dry.     Findings: No rash.  Neurological:     General: No focal deficit present.     Mental Status: Sydney Herman is alert.  Psychiatric:        Mood  and Affect: Mood normal.        Behavior: Behavior normal.     LABORATORY DATA:  CBC    Component Value Date/Time   WBC 1.8 (L) 02/13/2020 1025   WBC 8.3 11/11/2019 0845   RBC 4.03 02/13/2020 1025   HGB 11.9 (L) 02/13/2020 1025   HCT 35.8 (L) 02/13/2020 1025   PLT 185 02/13/2020 1025   MCV 88.8 02/13/2020 1025   MCH 29.5 02/13/2020 1025   MCHC 33.2 02/13/2020 1025   RDW 11.9 02/13/2020 1025   LYMPHSABS PENDING 02/13/2020 1025   MONOABS PENDING 02/13/2020 1025   EOSABS PENDING 02/13/2020 1025   BASOSABS PENDING 02/13/2020 1025    CMP     Component Value Date/Time   NA 138 02/03/2020 1002   K 3.7 02/03/2020 1002   CL 104 02/03/2020 1002   CO2 25 02/03/2020 1002   GLUCOSE 90 02/03/2020 1002   BUN 9 02/03/2020 1002   CREATININE 0.83 02/03/2020 1002   CREATININE 0.79 11/18/2019 1119   CALCIUM 9.5 02/03/2020 1002   PROT 8.6 (H) 02/03/2020 1002   ALBUMIN 4.2 02/03/2020 1002   AST 27 02/03/2020 1002   ALT 18 02/03/2020 1002   ALKPHOS 50 02/03/2020 1002   BILITOT 0.2 (L) 02/03/2020 1002   GFRNONAA >60 02/03/2020 1002   GFRNONAA 95 11/18/2019 1119   GFRAA 110 11/18/2019 1119       PENDING LABS:   RADIOGRAPHIC STUDIES:  MM CLIP PLACEMENT RIGHT  Result Date: 02/12/2020 CLINICAL DATA:  Status post mammographic-guided clip placement of the RIGHT breast. Earlier stereotactic biopsy was performed, however, patient fainted before a postprocedural biopsy site marker could be placed. Postprocedural diagnostic mammogram (localization clip) was obtained at the time of the earlier stereotactic biopsy (01/22/2018) to evaluate for residual calcifications. Residual calcifications were identified. Patient returned today for clip placement using the residual calcifications for localization purposes. EXAM: DIAGNOSTIC RIGHT MAMMOGRAM POST MAMMOGRAPHIC-GUIDED CLIP PLACEMENT. COMPARISON:  Previous exam(s). FINDINGS: Mammographic images were obtained following today's mammographic guided  clip placement within the upper RIGHT breast using residual calcifications for localization  purposes. The biopsy marking clip is in expected position at the site of biopsy, adjacent to the residual calcifications. IMPRESSION: Appropriate positioning of the Q shaped biopsy marking clip at the site of biopsy in the upper-outer quadrant of the RIGHT breast adjacent to the residual calcifications. Final Assessment: Post Procedure Mammograms for Marker Placement Electronically Signed   By: Franki Cabot M.D.   On: 02/12/2020 11:03   MM RT PLC BREAST LOC DEV   1ST LESION  INC STEREO GUIDE  Result Date: 02/11/2020 CLINICAL DATA:  Stereotactic biopsy was performed on 01/23/2020 for RIGHT breast calcifications with pathology result of IDC and DCIS. At the time of stereotactic biopsy, patient fainted prior to placement of the postprocedural biopsy site marker. Patient returns today for placement of the biopsy site marker in the vicinity of the residual calcifications in the upper RIGHT breast. EXAM: MAMMOGRAPHIC GUIDED CLIP PLACEMENT OF THE RIGHT BREAST COMPARISON:  Previous exam(s) FINDINGS: Patient presents for placement of a biopsy site marker in the RIGHT breast in the vicinity of residual calcifications in the upper RIGHT breast. I met with the patient and we discussed the procedure of localization. We discussed the high likelihood of a successful procedure. We discussed the risks of the procedure including infection, bleeding, tissue injury and further surgery. Informed, written consent was given. The usual time-out protocol was performed immediately prior to the procedure. Using mammographic guidance, sterile technique, 1% lidocaine as local anesthesia, a Q shaped biopsy clip was placed adjacent to the residual calcifications in the upper RIGHT breast. The follow-up mammogram images confirm appropriate positioning of the Q shaped biopsy clip adjacent to the residual calcifications in the upper RIGHT breast. The  patient tolerated the final portion of the procedure well and was released from the [facility] in good condition. IMPRESSION: Q shaped biopsy site marker placement of the RIGHT breast. Patient again had a vasovagal reaction, and fainted, during today's clip (Q shaped) placement procedure. However, the Q shaped clip was eventually placed (as above) with the patient in decubitus positioning and appears appropriately positioned adjacent to the residual calcifications in the upper RIGHT breast. Given the repeated fainting, recommend using HydroMARK biopsy site markers for the recommended 2 site bracketed MRI-guided biopsy to determine extent of disease. Doing so will allow the possibility of subsequent ultrasound-guided radioactive seed localizations if needed. Electronically Signed   By: Franki Cabot M.D.   On: 02/11/2020 16:43          ASSESSMENT and PLAN:   Malignant neoplasm of upper outer quadrant of female breast ([place]) 01/28/2020: Palpable right breast lump: Mammogram and ultrasound revealed calcifications spanning 2 cm, biopsy revealed IDC with DCIS, grade 2, ER 5% weak, PR 0%, HER2 equivocal by IHC, FISH pending, Ki-67 10%  Pathology and radiology counseling: Discussed with the patient, the details of pathology including the type of breast cancer,the clinical staging, the significance of ER, PR and HER-2/neu receptors and the implications for treatment. After reviewing the pathology in detail, we proceeded to discuss the different treatment options between surgery, radiation, chemotherapy, antiestrogen therapies.  Treatment plan: 1. Neo- adjuvant chemotherapy with dose dense Adriamycin and Cytoxan followed by Taxol and carboplatin 2. Breast conserving surgery with sentinel lymph node biopsy 3. Adjuvant radiation 4. followed by adjuvant antiestrogen therapy 5.  Genetic testing  _______________________________________________________________________  Current Treatment: Neoadjuvant  chemotherapy with Adriamycin/Cytoxan/Keytruda, cycle 1 day 8 Echocardiogram 01/31/2020 EF 60-65%  Sydney Herman tolerated chemotherapy quite well.  Sydney Herman is having mild fatigue, but denies other  issues with the treatment.    For her nasal drainage I recommended that Sydney Herman try taking a low dose of OTC sudafed.  Sydney Herman is unable to tolerate nasal sprays due to nose bleeds.  We discussed that sudafed can cause tachycardia, and Sydney Herman knows to monitor for this and to increase her fluid intake and take 1/2 the dose or stop if this occurs.  If not improved by Monday/Tuesday Sydney Herman will call and we can prescribe antibiotics.    Her WBC is 1.8 today, ANC pennding, I reviewed with her that her WBC are low and Sydney Herman is at increased risk for infection, and Sydney Herman knows to call immediately/go to ER for a fever.   Neutropenic precautions reviewed.    We will see [place] back in 2 weeks for labs, f/u, and her next chemo.         All questions were answered. The patient knows to call the clinic with any problems, questions or concerns. We can certainly see the patient much sooner if necessary.  Total encounter time: 20 minutes*  Wilber Bihari, NP 02/13/20 11:00 AM Medical Oncology and Hematology [facility] [place], [place] [address] Tel. [phone]    Fax. [phone]  *Total Encounter Time as defined by the Centers for Medicare and Medicaid Services includes, in addition to the face-to-face time of a patient visit (documented in the note above) non-face-to-face time: obtaining and reviewing outside history, ordering and reviewing medications, tests or procedures, care coordination (communications with other health care professionals or caregivers) and documentation in the medical record.

## 2020-02-12 NOTE — Assessment & Plan Note (Addendum)
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 After reviewing the pathology in detail, we proceeded to discuss the different treatment options between surgery, radiation, chemotherapy, antiestrogen therapies.  Treatment plan: 1. Neo- adjuvant chemotherapy with dose dense Adriamycin and Cytoxan followed by Taxol and carboplatin 2. Breast conserving surgery with sentinel lymph node biopsy 3. Adjuvant radiation 4. followed by adjuvant antiestrogen therapy 5.  Genetic testing  _______________________________________________________________________  Current Treatment: Neoadjuvant chemotherapy with Adriamycin/Cytoxan/Keytruda, cycle 1 day 8 Echocardiogram 01/31/2020 EF 60-65%  She tolerated chemotherapy quite well.  She is having mild fatigue, but denies other issues with the treatment.    For her nasal drainage I recommended that she try taking a low dose of OTC sudafed.  She is unable to tolerate nasal sprays due to nose bleeds.  We discussed that sudafed can cause tachycardia, and she knows to monitor for this and to increase her fluid intake and take 1/2 the dose or stop if this occurs.  If not improved by Monday/Tuesday she will call and we can prescribe antibiotics.    Her WBC is 1.8 today, ANC pennding, I reviewed with her that her WBC are low and she is at increased risk for infection, and she knows to call immediately/go to ER for a fever.   Neutropenic precautions reviewed.    We will see Sydney Herman back in 2 weeks for labs, f/u, and her next chemo.

## 2020-02-13 ENCOUNTER — Ambulatory Visit: Payer: 59 | Admitting: Adult Health

## 2020-02-13 ENCOUNTER — Other Ambulatory Visit: Payer: Self-pay

## 2020-02-13 ENCOUNTER — Inpatient Hospital Stay: Payer: 59 | Attending: Hematology and Oncology

## 2020-02-13 ENCOUNTER — Other Ambulatory Visit: Payer: 59

## 2020-02-13 ENCOUNTER — Encounter: Payer: Self-pay | Admitting: *Deleted

## 2020-02-13 ENCOUNTER — Inpatient Hospital Stay: Payer: 59

## 2020-02-13 ENCOUNTER — Inpatient Hospital Stay: Payer: 59 | Admitting: Adult Health

## 2020-02-13 ENCOUNTER — Encounter: Payer: Self-pay | Admitting: Adult Health

## 2020-02-13 VITALS — BP 120/79 | HR 82 | Temp 97.9°F | Resp 18 | Ht 61.0 in | Wt 122.6 lb

## 2020-02-13 DIAGNOSIS — Z803 Family history of malignant neoplasm of breast: Secondary | ICD-10-CM | POA: Diagnosis not present

## 2020-02-13 DIAGNOSIS — R5383 Other fatigue: Secondary | ICD-10-CM | POA: Diagnosis not present

## 2020-02-13 DIAGNOSIS — E538 Deficiency of other specified B group vitamins: Secondary | ICD-10-CM | POA: Insufficient documentation

## 2020-02-13 DIAGNOSIS — Z8049 Family history of malignant neoplasm of other genital organs: Secondary | ICD-10-CM | POA: Diagnosis not present

## 2020-02-13 DIAGNOSIS — C50411 Malignant neoplasm of upper-outer quadrant of right female breast: Secondary | ICD-10-CM | POA: Insufficient documentation

## 2020-02-13 DIAGNOSIS — E785 Hyperlipidemia, unspecified: Secondary | ICD-10-CM | POA: Insufficient documentation

## 2020-02-13 DIAGNOSIS — Z5112 Encounter for antineoplastic immunotherapy: Secondary | ICD-10-CM | POA: Diagnosis not present

## 2020-02-13 DIAGNOSIS — Z5189 Encounter for other specified aftercare: Secondary | ICD-10-CM | POA: Insufficient documentation

## 2020-02-13 DIAGNOSIS — Z8249 Family history of ischemic heart disease and other diseases of the circulatory system: Secondary | ICD-10-CM | POA: Diagnosis not present

## 2020-02-13 DIAGNOSIS — Z8042 Family history of malignant neoplasm of prostate: Secondary | ICD-10-CM | POA: Insufficient documentation

## 2020-02-13 DIAGNOSIS — Z5111 Encounter for antineoplastic chemotherapy: Secondary | ICD-10-CM | POA: Diagnosis not present

## 2020-02-13 DIAGNOSIS — Z170 Estrogen receptor positive status [ER+]: Secondary | ICD-10-CM | POA: Diagnosis not present

## 2020-02-13 DIAGNOSIS — Z79899 Other long term (current) drug therapy: Secondary | ICD-10-CM | POA: Diagnosis not present

## 2020-02-13 DIAGNOSIS — Z1379 Encounter for other screening for genetic and chromosomal anomalies: Secondary | ICD-10-CM | POA: Insufficient documentation

## 2020-02-13 DIAGNOSIS — Z800 Family history of malignant neoplasm of digestive organs: Secondary | ICD-10-CM | POA: Diagnosis not present

## 2020-02-13 DIAGNOSIS — Z833 Family history of diabetes mellitus: Secondary | ICD-10-CM | POA: Diagnosis not present

## 2020-02-13 LAB — PREGNANCY, URINE: Preg Test, Ur: NEGATIVE

## 2020-02-13 LAB — CMP (CANCER CENTER ONLY)
ALT: 25 U/L (ref 0–44)
AST: 22 U/L (ref 15–41)
Albumin: 3.9 g/dL (ref 3.5–5.0)
Alkaline Phosphatase: 64 U/L (ref 38–126)
Anion gap: 9 (ref 5–15)
BUN: 8 mg/dL (ref 6–20)
CO2: 28 mmol/L (ref 22–32)
Calcium: 9.7 mg/dL (ref 8.9–10.3)
Chloride: 103 mmol/L (ref 98–111)
Creatinine: 0.73 mg/dL (ref 0.44–1.00)
GFR, Estimated: >60 mL/min (ref >60)
Glucose, Bld: 111 mg/dL — ABNORMAL HIGH (ref 70–99)
Potassium: 3.7 mmol/L (ref 3.5–5.1)
Sodium: 140 mmol/L (ref 135–145)
Total Bilirubin: <0.2 mg/dL — ABNORMAL LOW (ref 0.3–1.2)
Total Protein: 7.7 g/dL (ref 6.5–8.1)

## 2020-02-13 LAB — CBC WITH DIFFERENTIAL (CANCER CENTER ONLY)
Abs Immature Granulocytes: 0.03 10*3/uL (ref 0.00–0.07)
Basophils Absolute: 0 10*3/uL (ref 0.0–0.1)
Basophils Relative: 1 %
Eosinophils Absolute: 0 10*3/uL (ref 0.0–0.5)
Eosinophils Relative: 2 %
HCT: 35.8 % — ABNORMAL LOW (ref 36.0–46.0)
Hemoglobin: 11.9 g/dL — ABNORMAL LOW (ref 12.0–15.0)
Immature Granulocytes: 2 %
Lymphocytes Relative: 55 %
Lymphs Abs: 1 10*3/uL (ref 0.7–4.0)
MCH: 29.5 pg (ref 26.0–34.0)
MCHC: 33.2 g/dL (ref 30.0–36.0)
MCV: 88.8 fL (ref 80.0–100.0)
Monocytes Absolute: 0.3 10*3/uL (ref 0.1–1.0)
Monocytes Relative: 16 %
Neutro Abs: 0.4 10*3/uL — CL (ref 1.7–7.7)
Neutrophils Relative %: 24 %
Platelet Count: 185 10*3/uL (ref 150–400)
RBC: 4.03 MIL/uL (ref 3.87–5.11)
RDW: 11.9 % (ref 11.5–15.5)
WBC Count: 1.8 10*3/uL — ABNORMAL LOW (ref 4.0–10.5)
nRBC: 0 % (ref 0.0–0.2)

## 2020-02-13 NOTE — Progress Notes (Signed)
CRITICAL VALUE STICKER  CRITICAL VALUE: ANC 0.4  RECEIVER (on-site recipient of call): Manuela Schwartz, [place] NOTIFIED: 02/12/31 @ 1106  MESSENGER (representative from lab): H[place]  MD NOTIFIED: Wilber Bihari, NP  TIME OF NOTIFICATION:1110  RESPONSE: Was already aware during office visit and neutropenic precautions were discussed w/patient.

## 2020-02-14 ENCOUNTER — Telehealth: Payer: Self-pay | Admitting: Genetic Counselor

## 2020-02-14 ENCOUNTER — Telehealth: Payer: Self-pay | Admitting: Hematology and Oncology

## 2020-02-14 NOTE — Telephone Encounter (Signed)
No 2/3 los. No changes made to pt's schedule.

## 2020-02-14 NOTE — Telephone Encounter (Signed)
Called to discuss genetic test results. Ms. Horace is currently busy with work and states that now is not a good time to talk. We will plan to call her again on Monday morning.

## 2020-02-17 ENCOUNTER — Encounter: Payer: Self-pay | Admitting: Family Medicine

## 2020-02-17 ENCOUNTER — Telehealth: Payer: Self-pay | Admitting: Genetic Counselor

## 2020-02-17 ENCOUNTER — Telehealth (INDEPENDENT_AMBULATORY_CARE_PROVIDER_SITE_OTHER): Payer: 59 | Admitting: Family Medicine

## 2020-02-17 ENCOUNTER — Other Ambulatory Visit: Payer: Self-pay | Admitting: Family Medicine

## 2020-02-17 ENCOUNTER — Encounter: Payer: Self-pay | Admitting: Osteopathic Medicine

## 2020-02-17 DIAGNOSIS — R059 Cough, unspecified: Secondary | ICD-10-CM

## 2020-02-17 DIAGNOSIS — J0100 Acute maxillary sinusitis, unspecified: Secondary | ICD-10-CM | POA: Diagnosis not present

## 2020-02-17 MED ORDER — BENZONATATE 100 MG PO CAPS: 100.0000 mg | ORAL_CAPSULE | Freq: Two times a day (BID) | ORAL | 0 refills | Status: DC | PRN

## 2020-02-17 MED ORDER — DOXYCYCLINE HYCLATE 100 MG PO TABS
100.0000 mg | ORAL_TABLET | Freq: Two times a day (BID) | ORAL | 0 refills | Status: DC
Start: 2020-02-17 — End: 2020-02-17

## 2020-02-17 MED FILL — BENZONATATE 100 MG CAPS: 100 | 10 days supply | Qty: 20 | Fill #0

## 2020-02-17 MED FILL — DOXYCYCLINE HYCLATE 100 MG: 100 | 7 days supply | Qty: 14 | Fill #0

## 2020-02-17 NOTE — Telephone Encounter (Signed)
LVM that her genetic test results are available and requested that she call back to discuss them.  

## 2020-02-17 NOTE — Progress Notes (Addendum)
Virtual Video Visit via MyChart Note  I connected with  Sydney Herman on 02/17/20 at  4:00 PM EST by the video enabled telemedicine application for MyChart, and verified that I am speaking with the correct person using two identifiers.   I introduced myself as a Designer, jewellery with the practice. We discussed the limitations of evaluation and management by telemedicine and the availability of in person appointments. The patient expressed understanding and agreed to proceed.  Participating parties in this visit include: The patient and the nurse practitioner listed.  The patient is: At home I am: in the office - [facility]  Subjective:    CC:  Chief Complaint  Patient presents with  . Sinusitis  . Cough    HPI: Sydney Herman is a 40 y.o. year old female presenting today via [place] today for sinus congestion for over 2 weeks that has been progressively worsening since 02/08/20. She reports postnasal drainage causing a cough. States she is not sleeping through the night due to coughing episodes. She had a negative COVID test on 01/31/20 after her symptoms began. She recently started chemotherapy and WBC has dropped. She repots the cancer center nurses encouraged her to try Mucinex and Sudafed for her symptoms, but these have not been helping.   Sinus pain and pressure worse in maxillary sinuses but also noticeable at frontal sinuses. Painful all day, but seem to worsen when she lays down to sleep.  Cough is intermittent throughout the day, but does keep her up at night. She reports the cough sounds congested, but she has not coughed up any sputum. Denies shortness of breath, wheezing, chest pain, sore throat, ear pain.  She has been taking Claritin, Mucinex, Sudafed. Drinking plenty of fluids including hot tea, and taking steamy showers to give some sinus relief.   Past medical history, Surgical history, Family history not pertinant except as noted below, Social history,  Allergies, and medications have been entered into the medical record, reviewed, and corrections made.   Review of Systems:  All review of systems negative except what is listed in the HPI   Objective:    General:  Speaking clearly in complete sentences. Absent shortness of breath noted.   Alert and oriented x3.   Normal judgment.  Absent acute distress.   Impression and Recommendations:    1. Acute maxillary sinusitis, recurrence not specified 2. Cough  Since symptoms have been ongoing for over 2 weeks and recently started worsening, we will go ahead and start doxycycline for ABRS as patient is allergic to PCNs. Will also send prescription for Tessalon for cough. Patient educated on medications and agreeable to plan. Recommend continuing OTC/supportive therapy at home including flonase, humidifier use, mucinex, warm compresses, APAP/IBU for sinus pain.   Follow-up if symptoms worsen or fail to improve.    I discussed the assessment and treatment plan with the patient. The patient was provided an opportunity to ask questions and all were answered. The patient agreed with the plan and demonstrated an understanding of the instructions.   The patient was advised to call back or seek an in-person evaluation if the symptoms worsen or if the condition fails to improve as anticipated.  I provided 20 minutes of non-face-to-face interaction with this [place] visit including intake, same-day documentation, and chart review.   Terrilyn Saver, NP

## 2020-02-17 NOTE — Patient Instructions (Signed)
It was nice to meet you today! I am so sorry you are not feeling well. I hope the doxycycline and tessalon will help you start feeling better soon. Continue over-the-counter/supportive care: Flonase, Mucinex, Claritin, humidifier, and warm compresses over your sinuses to help with the pain. Stay hydrated and try to get some rest! I hope you feel better soon! Please call us if you are not noticing some improvement over the next few days or if your symptoms worsen.   Sinusitis, Adult Sinusitis is soreness and swelling (inflammation) of your sinuses. Sinuses are hollow spaces in the bones around your face. They are located:  Around your eyes.  In the middle of your forehead.  Behind your nose.  In your cheekbones. Your sinuses and nasal passages are lined with a fluid called mucus. Mucus drains out of your sinuses. Swelling can trap mucus in your sinuses. This lets germs (bacteria, virus, or fungus) grow, which leads to infection. Most of the time, this condition is caused by a virus. What are the causes? This condition is caused by:  Allergies.  Asthma.  Germs.  Things that block your nose or sinuses.  Growths in the nose (nasal polyps).  Chemicals or irritants in the air.  Fungus (rare). What increases the risk? You are more likely to develop this condition if:  You have a weak body defense system (immune system).  You do a lot of swimming or diving.  You use nasal sprays too much.  You smoke. What are the signs or symptoms? The main symptoms of this condition are pain and a feeling of pressure around the sinuses. Other symptoms include:  Stuffy nose (congestion).  Runny nose (drainage).  Swelling and warmth in the sinuses.  Headache.  Toothache.  A cough that may get worse at night.  Mucus that collects in the throat or the back of the nose (postnasal drip).  Being unable to smell and taste.  Being very tired (fatigue).  A fever.  Sore throat.  Bad  breath. How is this diagnosed? This condition is diagnosed based on:  Your symptoms.  Your medical history.  A physical exam.  Tests to find out if your condition is short-term (acute) or long-term (chronic). Your doctor may: ? Check your nose for growths (polyps). ? Check your sinuses using a tool that has a light (endoscope). ? Check for allergies or germs. ? Do imaging tests, such as an MRI or CT scan. How is this treated? Treatment for this condition depends on the cause and whether it is short-term or long-term.  If caused by a virus, your symptoms should go away on their own within 10 days. You may be given medicines to relieve symptoms. They include: ? Medicines that shrink swollen tissue in the nose. ? Medicines that treat allergies (antihistamines). ? A spray that treats swelling of the nostrils. ? Rinses that help get rid of thick mucus in your nose (nasal saline washes).  If caused by bacteria, your doctor may wait to see if you will get better without treatment. You may be given antibiotic medicine if you have: ? A very bad infection. ? A weak body defense system.  If caused by growths in the nose, you may need to have surgery. Follow these instructions at home: Medicines  Take, use, or apply over-the-counter and prescription medicines only as told by your doctor. These may include nasal sprays.  If you were prescribed an antibiotic medicine, take it as told by your doctor. Do not  stop taking the antibiotic even if you start to feel better. Hydrate and humidify  Drink enough water to keep your pee (urine) pale yellow.  Use a cool mist humidifier to keep the humidity level in your home above 50%.  Breathe in steam for 10-15 minutes, 3-4 times a day, or as told by your doctor. You can do this in the bathroom while a hot shower is running.  Try not to spend time in cool or dry air.   Rest  Rest as much as you can.  Sleep with your head raised  (elevated).  Make sure you get enough sleep each night. General instructions  Put a warm, moist washcloth on your face 3-4 times a day, or as often as told by your doctor. This will help with discomfort.  Wash your hands often with soap and water. If there is no soap and water, use hand sanitizer.  Do not smoke. Avoid being around people who are smoking (secondhand smoke).  Keep all follow-up visits as told by your doctor. This is important.   Contact a doctor if:  You have a fever.  Your symptoms get worse.  Your symptoms do not get better within 10 days. Get help right away if:  You have a very bad headache.  You cannot stop throwing up (vomiting).  You have very bad pain or swelling around your face or eyes.  You have trouble seeing.  You feel confused.  Your neck is stiff.  You have trouble breathing. Summary  Sinusitis is swelling of your sinuses. Sinuses are hollow spaces in the bones around your face.  This condition is caused by tissues in your nose that become inflamed or swollen. This traps germs. These can lead to infection.  If you were prescribed an antibiotic medicine, take it as told by your doctor. Do not stop taking it even if you start to feel better.  Keep all follow-up visits as told by your doctor. This is important. This information is not intended to replace advice given to you by your health care provider. Make sure you discuss any questions you have with your health care provider. Document Revised: 05/29/2017 Document Reviewed: 05/29/2017 Elsevier Patient Education  2021 [employer].

## 2020-02-19 ENCOUNTER — Encounter: Payer: Self-pay | Admitting: Genetic Counselor

## 2020-02-19 NOTE — Telephone Encounter (Signed)
Revealed positive genetic testing. 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 We have scheduled a virtual follow-up appointment to discuss this result in greater detail on Friday, 02/21/20 at 11am.

## 2020-02-20 ENCOUNTER — Ambulatory Visit: Payer: 59

## 2020-02-20 ENCOUNTER — Ambulatory Visit: Payer: 59 | Admitting: Hematology and Oncology

## 2020-02-20 ENCOUNTER — Other Ambulatory Visit: Payer: 59

## 2020-02-21 ENCOUNTER — Inpatient Hospital Stay (HOSPITAL_BASED_OUTPATIENT_CLINIC_OR_DEPARTMENT_OTHER): Payer: 59 | Admitting: Genetic Counselor

## 2020-02-21 ENCOUNTER — Encounter: Payer: Self-pay | Admitting: Genetic Counselor

## 2020-02-21 DIAGNOSIS — Z1501 Genetic susceptibility to malignant neoplasm of breast: Secondary | ICD-10-CM

## 2020-02-21 DIAGNOSIS — Z1379 Encounter for other screening for genetic and chromosomal anomalies: Secondary | ICD-10-CM

## 2020-02-21 DIAGNOSIS — Z1509 Genetic susceptibility to other malignant neoplasm: Secondary | ICD-10-CM

## 2020-02-21 NOTE — Progress Notes (Signed)
GENETIC TEST RESULTS   Patient Name: Sydney Herman Patient Age: 40 y.o. Encounter Date: 02/21/2020  Referring Provider: Kyung Rudd, MD [address] [place],  [place] [address]    Sydney Herman was previously seen in the [facility] on 01/30/2020 due to a personal and family history of cancer and concern regarding a hereditary predisposition to cancer in the family. Please refer to the prior Genetics clinic note for more information regarding Sydney Herman's medical and family histories and our assessment at the time.   FAMILY HISTORY:  We obtained a detailed, 4-generation family history.  Significant diagnoses are listed below: Family History  Problem Relation Age of Onset  . Hypertension Mother   . Breast cancer Mother 75       early 90's  . Diabetes Father   . Atrial fibrillation Father   . Other Father        biopsies on kidney- noncancerous. stated potentially precancerous  . Prostate cancer Maternal Grandfather 90  . Endometrial cancer Paternal Grandmother 89  . Esophageal cancer Paternal Uncle        dx early 50s   Ms. Bardwell has one daughter (age 73) and one son (age 58). She has a brother (age 85) who has two daughters. None of these family members have had cancer.  Sydney Herman mother is 11 and was diagnosed with breast cancer when she was 78. Sydney Herman has two maternal aunts. Her maternal grandmother died at age 40, and her maternal grandfather is alive at age 75 with prostate cancer that was diagnosed when he was 27.   Sydney Herman father is 64 and has not had cancer. She had one paternal aunt and three paternal uncles. One uncle died in his early 58s from esophageal cancer. Her paternal grandmother died at age 64 and had endometrial cancer diagnosed at age 24. Her paternal grandfather is alive at age 66 and has not had cancer.  Patient's maternal ancestors are of unknown descent, and paternal ancestors are of [place] descent. There is no reported Ashkenazi Jewish  ancestry. There is no known consanguinity.  GENETIC TESTING: Genetic testing reported on 02/13/2020 through the CustomNext-Cancer + RNAinsight panel offered by [employer]. A single, heterozygous pathogenic variant was detected in the BRCA2 gene called [id number].   The CustomNext-Cancer+RNAinsight panel offered by [facility] includes sequencing and rearrangement analysis for the following 47 genes:  APC, ATM, AXIN2, BARD1, BMPR1A, BRCA1, BRCA2, BRIP1, CDH1, CDK4, CDKN2A, CHEK2, DICER1, EPCAM, GREM1, HOXB13, MEN1, MLH1, MSH2, MSH3, MSH6, MUTYH, NBN, NF1, NF2, NTHL1, PALB2, PMS2, POLD1, POLE, PTEN, RAD51C, RAD51D, RECQL, RET, SDHA, SDHAF2, SDHB, SDHC, SDHD, SMAD4, SMARCA4, STK11, TP53, TSC1, TSC2, and VHL.  RNA data is routinely analyzed for use in variant interpretation for all genes. The test report will be scanned into EPIC and located under the Molecular Pathology section of the Results Review tab.  A portion of the result report is included below for reference.     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 At this time, it is unknown if this variant is associated with increased cancer risk or if this is a normal finding, but most variants such as this get reclassified to being inconsequential. It should not be used to make medical management decisions. With time, we suspect the lab will determine the significance of this variant, if any. If we do learn more about it, we will try to contact Sydney Herman to  discuss it further. However, it is important to stay in touch with [place] periodically and keep the address and phone number up to date.  CANCER RISKS:  Both women and men with a BRCA2 mutation are at an increased risk for cancer. Studies show that women with a BRCA2 mutation have a 61-77% lifetime risk to develop breast cancer, up to a 26% risk to develop a second breast cancer within 20 years, and up to a 11-25% risk to  develop ovarian cancer. Men have a 7-8% lifetime risk to develop female breast cancer and a 20-30% risk for prostate cancer. Both men and women also have a 3-5% increased risk for pancreatic cancer and a 3-5% increased risk for melanoma.  CANCER RISK REDUCTION & SCREENING RECOMMENDATIONS: The [employer] (NCCN) recommends the following for women who carry BRCA mutations: . Breast awareness starting at the age of 56 years; women should report any changes to their breasts to their health care provider. Periodic breast self-exams may facilitate breast self-awareness. . Clinical breast exams every 6-12 months, starting at age 41 years . Annual breast MRI and annual mammograms starting at age 65 years and 30 years, respectively, or individualized based on age of earliest onset of breast cancer in the family. . Consideration of risk reducing mastectomy, which reduces the risk of breast cancer by greater than 90%. . Consideration of risk reducing salpingo-oophorectomy (RRSO), ideally between the ages of 64-45, or individualized based on completion of child-bearing or age of earliest onset of ovarian cancer in the family. RRSO reduces the risk of ovarian cancer by greater than 90% and may reduce the risk of breast cancer by 50% if performed prior to menopause. Women who have undergone risk reducing RRSO have a small risk of peritoneal carcinoma and can consider annual CA-125 surveillance. . For women who still have their ovaries, transvaginal ultrasound and CA-125 testing starting at age 36-35 years, or 5-10 years prior to the earliest age of onset of ovarian cancer in the family can be considered. Studies have not demonstrated that ovarian cancer screening is effective in detecting early ovarian cancer. . Consideration of chemoprevention options such as oral contraceptives, Tamoxifen and Raloxifene, if appropriate for an individual.  Ms. Bame has already been strongly considering  bilateral mastectomies, which is the most effective option available to reduce future breast cancer risk. At this time, we recommend she continue to follow healthcare management guidelines that have been provided to her by her overseeing providers.   To reduce the risk for ovarian cancer, we recommend Ms. Nez have a prophylactic bilateral salpingo-oophorectomy when childbearing is completed, if planned. We discussed that screening with CA-125 blood tests and transvaginal ultrasounds can be done twice per year. However, these tests have not been shown to detect ovarian cancer at an early stage. Ms. Keleher will follow up with Dr. Lindi Adie in regards to her ovarian cancer risk.   The [employer]  (NCCN) recommends the following for men who carry a BRCA2 mutation: . Breast self-exam training and education starting at age 39 years . Clinical breast exam, every 12 months, starting at age 54 years . Consider annual mammogram screening in men with gynecomastia starting at age 71, or 80 years before the earliest known female breast cancer in the family (whichever comes first) . Prostate cancer screening starting at age 67 years (PSA and digital rectal exam)  The following is recommended for both men and women who carry a BRCA2 mutation: . Consideration of pancreatic  cancer screening if there is a family history of pancreatic cancer . General melanoma risk management, such as annual full-body skin examination and minimizing UV exposure . Consider investigational imaging and screening studies, when available (e.g. novel imaging technologies, more frequent screening intervals) in the context of a clinical trial  RISK REDUCTION: There are several things that can be offered to individuals who are carriers for BRCA mutations that will reduce the risk for getting cancer.    The use of oral contraceptives can lower the risk for ovarian cancer, and, per case control studies, does not  significantly increase the risk for breast cancer in BRCA patients. Case control studies have shown that oral contraceptives can lower the risk for ovarian cancer in women with BRCA mutations. Additionally, a more recent meta-analysis, including one corhort (n=3,181) and one case control study (1,096 cases and 2,878 controls) also showed an inverse correlation between ovarian cancer and ever having used oral contraceptives (OR, 0.58; 95% CI = 0.46-0.73). Studies on oral contraceptives and breast cancer have been conflicting, with some studies suggesting that there is not an increased risk for breast cancer in BRCA mutation carriers, while others suggest that there could be a risk.  That said, two meta-analysis studies have shown that there is not an increased risk for breast cancer with oral contraceptive use in BRCA1 and BRCA2 carriers.    In individuals who have a prophylactic bilateral salpino-oophorectomy (BSO) before menopause, the risk for breast cancer may be reduced by up to 50%. It has been reported that short term hormone replacement therapy in women undergoing prophylactic BSO does not negate the reduction of breast cancer risk associated with surgery. Caution should be used when considering use of HRT in BRCA carriers following BSO, given the limitations inherent in the nonrandomized studies that have been done (1.2022 NCCN guidelines).    FAMILY MEMBERS: It is important that all of Ms. Mackowski's relatives (both men and women) know of the presence of this gene mutation. Site-specific genetic testing can sort out who in the family is at risk and who is not.   Ms. Cataldi children are have a 50% (1 in 2) chance to have inherited this mutation. However, they are relatively young and this will not be of any consequence to them for several years. We do not test children because there is no risk to them until they are adults. We recommend they have genetic counseling and testing by the time they are in  their early 20s.    Ms. Orchard other first degree relatives (brother/parents) also have a 50% chance to have inherited this mutation. We recommend they have genetic testing for this same mutation, as identifying the presence of this mutation would allow them to also take advantage of risk-reducing measures. Many of Ms. Carpenter's relatives are not local. To locate genetic counselors in other cities, her family members can visit the website [email or web address] to find a cancer genetic counselor in their area.  Additionally, individuals with a pathogenic variant in BRCA2 are carriers of Fanconi anemia. Fanconi anemia is a rare autosomal recessive disorder that is characterized by bone marrow failure and variable presentation of anomalies, including short stature, abnormal skin pigmentation, abnormal thumbs, malformations of the skeletal and central nervous systems, and developmental delay. Risks for leukemia and early onset solid tumors are significantly elevated. For there to be a risk of Fanconi anemia in offspring, both parents would each have to have a single pathogenic variant in BRCA2; in such a case, the  risk of having an affected child is 25% (1 in 4).  SUPPORT AND RESOURCES:  If Ms. Searight is interested in BRCA-specific information and support, there are two groups, Facing Our Risk (www.facingourrisk.com) and Bright Pink (www.brightpink.org) which some people have found useful. They provide opportunities to speak with other individuals from high-risk families.   We encouraged Ms. Hawkey to remain in contact with [place] on an annual basis so we can update her personal and family histories, and let her know of advances in cancer genetics that may benefit the family. Our contact number was provided. Ms. Nuncio questions were answered to her satisfaction today, and she knows she is welcome to call anytime with additional questions.   Clint Guy, [place], [facility] Licensed, Certified Oncologist.[email or web address] phone: [phone]  The patient was seen for a total of 30 minutes in face-to-face genetic counseling.

## 2020-02-22 ENCOUNTER — Ambulatory Visit: Payer: 59

## 2020-02-26 ENCOUNTER — Encounter: Payer: Self-pay | Admitting: Hematology and Oncology

## 2020-02-26 NOTE — Progress Notes (Signed)
Pthas been enrolledw/theMerckCopay Assistance programfor Keytruda for $25,000 from 01/11/20- 01/09/21.Hercopay for Keytruda will be $25.  

## 2020-02-26 NOTE — Assessment & Plan Note (Addendum)
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 After reviewing the pathology in detail, we proceeded to discuss the different treatment options between surgery, radiation, chemotherapy, antiestrogen therapies.  BRCA2 positive: Risk discussion regarding future breast cancer, and ovarian cancer risk, discussed risk reducing bilateral mastectomies and RRSO.    Treatment plan: 1. Neo- adjuvant chemotherapy with dose dense Adriamycin and Cytoxan/Keytruda followed by Taxol and carboplatin 2. Breast conserving surgery with sentinel lymph node biopsy 3. Adjuvant radiation 4. followed by adjuvant antiestrogen therapy 5.  Genetic testing  _______________________________________________________________________  Current Treatment: Neoadjuvant chemotherapy with Adriamycin/Cytoxan/Keytruda, cycle 2 day 1 Echocardiogram 01/31/2020 EF 60-65%  Danilee is doing well today.  Her labs are stable and she will proceed with her second cycle of neoadjuvant chemotherapy.  Her breast tumor is softer which is a sign of early clinical response to treatment.    Shaunetta has BRCA 2 positivity.  She has already decided on bilateral mastectomies at the time of surgery and will see Dr. Leta Baptist next week.  She will let me know if I need to get her in with gyn-oncology to discuss RRSO/monitoring.    Marylene Land and I reviewed her overall treatment plan.  She was hoping to take a trip for the week of spring break with her children.  I let her know that she should certainly go ahead and plan that trip, and we will work her treatment around it.     Kelen will return in 3 weeks for labs, f/u with Dr. Pamelia Hoit, and cycle 3 of treatment.  She knows to call for any questions that may arise between now and her next appointment.  We are happy to see her sooner if needed.

## 2020-02-26 NOTE — Progress Notes (Signed)
[facility] Follow up:    Emeterio Reeve, DO [address] [place] [address]   DIAGNOSIS: Cancer Staging Malignant neoplasm of upper outer quadrant of female breast ([place]) Staging form: Breast, AJCC 8th Edition - Clinical stage from 01/28/2020: Stage IA (cT1c, cN0, cM0, G2, ER+, PR-, HER2: Equivocal) - Signed by Nicholas Lose, MD on 01/29/2020 Stage prefix: Initial diagnosis Method of lymph node assessment: Clinical   SUMMARY OF ONCOLOGIC HISTORY: Oncology History  Malignant neoplasm of upper outer quadrant of female breast ([place])  01/28/2020 Initial Diagnosis   Patient palpated a right breast lump. Diagnostic mammogram and US showed calcifications spanning 2.0cm in the right breast. Biopsy showed invasive and in situ carcinoma, grade 2. 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 Testing was completed through the CustomNext-Cancer + RNAinsight panel offered by [facility]. 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 The report date is 02/13/2020.  The CustomNext-Cancer+RNAinsight panel offered by [facility] includes sequencing and rearrangement analysis for the following 47 genes:  APC, ATM, AXIN2, BARD1, BMPR1A, BRCA1, BRCA2, BRIP1, CDH1, CDK4, CDKN2A, CHEK2, DICER1, EPCAM, GREM1, HOXB13, MEN1, MLH1, MSH2, MSH3, MSH6, MUTYH, NBN, NF1, NF2, NTHL1, PALB2, PMS2, POLD1, POLE, PTEN,  RAD51C, RAD51D, RECQL, RET, SDHA, SDHAF2, SDHB, SDHC, SDHD, SMAD4, SMARCA4, STK11, TP53, TSC1, TSC2, and VHL.  RNA data is routinely analyzed for use in variant interpretation for all genes.     CURRENT THERAPY: Adriamycin/Cytoxan/Keytruda  INTERVAL HISTORY: Sydney Herman 40 y.o. female returns for evaluation prior to receiving cycle 2 of her neoadjuvant chemotherapy.  She is tolerating this well and is feeling overall well today. She notes that she has continued to exercise.   Since her last visit her genetic testing results revealed a BRCA2 mutation.  She has no family history of pancreatic cancer.  She is aware that it increases her risk of having another breast cancer in the future, and also increases her risk for ovarian cancer.  She has planned to undergo bilateral mastectomies at time of surgery and is going to meet with Dr. Iran Planas next week to discuss reconstruction.     Her gynecologist is Dr. Julien Girt, and she has an upcoming appt with her in the next 2-3 weeks.  She plans on discussing with her further, and is open to seeing our gyn-oncology group if needed.     Patient Active Problem List   Diagnosis Date Noted  . BRCA2 gene mutation positive 02/21/2020  . Genetic testing 02/13/2020  . Family history of prostate cancer   . Family history of uterine cancer   . Family history of esophageal cancer   . Malignant neoplasm of upper outer quadrant of female breast ([place]) 01/28/2020  . History of abnormal cervical Pap smear 11/16/2018  . Family history of type 2 diabetes mellitus in father 11/16/2018  . Family history of breast cancer in mother 11/16/2018  .  Mild hyperlipidemia 11/02/2017  . Vitamin B12 deficiency 02/20/2017  . Generalized headaches     is allergic to amoxil [amoxicillin].  MEDICAL HISTORY: Past Medical History:  Diagnosis Date  . 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 Georgette Dover 2015- was thought to be scar tissue- no growth since that  time   . Breast cancer ([place]) 01/23/2020   Right  . Chicken pox   . Family history of esophageal cancer   . Family history of prostate cancer   . Family history of uterine cancer   . Generalized headaches    Has had some migraines in past as well. once a week or less. Usually occipital and associated with sinus pressure as well.   Marland Kitchen Heartburn in pregnancy     SURGICAL HISTORY: Past Surgical History:  Procedure Laterality Date  . CESAREAN SECTION  07/2010   [facility]  . CESAREAN SECTION N/A 12/27/2012   [facility]om[facility] 2nd   . PORTACATH PLACEMENT Right 02/04/2020   Procedure: INSERTION PORT-A-CATH WITH ULTRASOUND;  Surgeon: Rolm Bookbinder, MD;  Location: [facility];  Service: General;  Laterality: Right;  . WISDOM TOOTH EXTRACTION      SOCIAL HISTORY: Social History   Socioeconomic History  . Marital status: Married    Spouse name: Not on file  . Number of children: 2  . Years of education: Not on file  . Highest education level: Not on file  Occupational History  . Not on file  Tobacco Use  . Smoking status: Never Smoker  . Smokeless tobacco: Never Used  Vaping Use  . Vaping Use: Never used  Substance and Sexual Activity  . Alcohol use: Not Currently    Alcohol/week: 2.0 - 3.0 standard drinks    Types: 2 - 3 Standard drinks or equivalent per week  . Drug use: Never  . Sexual activity: Yes    Partners: Male    Birth control/protection: Pill, Condom  Other Topics Concern  . 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 Difficulty of  Paying Living Expenses: Not hard at all  Food Insecurity: No Food Insecurity  . Worried About Charity fundraiser in the Last Year: Never true  . Ran Out of Food in the Last Year: Never true  Transportation Needs: No Transportation Needs  . Lack of Transportation (Medical): No  . Lack of Transportation (Non-Medical): No  Physical Activity: Not on file  Stress: Not on file  Social Connections: Not on file  Intimate Partner Violence: Not on file    FAMILY HISTORY: Family History  Problem Relation Age of Onset  . Hypertension Mother   . Breast cancer Mother 93       early 42's  . Diabetes Father   . Atrial fibrillation Father   . Other Father        biopsies on kidney- noncancerous. stated potentially precancerous  . Prostate cancer Maternal Grandfather 90  . Endometrial cancer  Paternal Grandmother 42  . Esophageal cancer Paternal Uncle        dx early 7s    Review of Systems  Constitutional: Positive for fatigue (mild). Negative for appetite change, chills, fever and unexpected weight change.  HENT:   Negative for hearing loss, lump/mass and trouble swallowing.   Eyes: Negative for eye problems and icterus.  Respiratory: Negative for chest tightness, cough and shortness of breath.   Cardiovascular: Negative for chest pain, leg swelling and palpitations.  Gastrointestinal: Negative for abdominal distention, abdominal pain, constipation, diarrhea, nausea and vomiting.  Endocrine: Negative for hot flashes.  Genitourinary: Negative for difficulty urinating.   Musculoskeletal: Negative for arthralgias.  Skin: Negative for itching and rash.  Neurological: Negative for dizziness, extremity weakness, headaches and numbness.  Hematological: Negative for adenopathy. Does not bruise/bleed easily.  Psychiatric/Behavioral: Negative for depression. The patient is not nervous/anxious.       PHYSICAL EXAMINATION  ECOG PERFORMANCE STATUS: 1 - Symptomatic but completely  ambulatory  Vitals:   02/27/20 1222  BP: 109/71  Pulse: 70  Resp: 18  Temp: (!) 97.5 F (36.4 C)  SpO2: 100%    Physical Exam Constitutional:      General: She is not in acute distress.    Appearance: Normal appearance. She is not toxic-appearing.  HENT:     Head: Normocephalic and atraumatic.  Eyes:     General: No scleral icterus. Cardiovascular:     Rate and Rhythm: Normal rate and regular rhythm.     Pulses: Normal pulses.     Heart sounds: Normal heart sounds.  Pulmonary:     Effort: Pulmonary effort is normal.     Breath sounds: Normal breath sounds.     Comments: Right upper outer breast nodule is softer Abdominal:     General: Abdomen is flat. Bowel sounds are normal. There is no distension.     Palpations: Abdomen is soft.     Tenderness: There is no abdominal tenderness.  Musculoskeletal:        General: No swelling.     Cervical back: Neck supple.  Lymphadenopathy:     Cervical: No cervical adenopathy.  Skin:    General: Skin is warm and dry.     Findings: No rash.  Neurological:     General: No focal deficit present.     Mental Status: She is alert.  Psychiatric:        Mood and Affect: Mood normal.        Behavior: Behavior normal.     LABORATORY DATA:  CBC    Component Value Date/Time   WBC 7.6 02/27/2020 1207   WBC 8.3 11/11/2019 0845   RBC 3.84 (L) 02/27/2020 1207   HGB 11.3 (L) 02/27/2020 1207   HCT 34.2 (L) 02/27/2020 1207   PLT 397 02/27/2020 1207   MCV 89.1 02/27/2020 1207   MCH 29.4 02/27/2020 1207   MCHC 33.0 02/27/2020 1207   RDW 12.9 02/27/2020 1207   LYMPHSABS 1.9 02/27/2020 1207   MONOABS 0.9 02/27/2020 1207   EOSABS 0.0 02/27/2020 1207   BASOSABS 0.1 02/27/2020 1207    CMP     Component Value Date/Time   NA 136 02/27/2020 1207   K 3.9 02/27/2020 1207   CL 103 02/27/2020 1207   CO2 26 02/27/2020 1207   GLUCOSE 86 02/27/2020 1207   BUN 10 02/27/2020 1207   CREATININE 0.73 02/27/2020 1207   CREATININE 0.79  11/18/2019 1119   CALCIUM 9.2 02/27/2020 1207  PROT 7.7 02/27/2020 1207   ALBUMIN 4.1 02/27/2020 1207   AST 32 02/27/2020 1207   ALT 26 02/27/2020 1207   ALKPHOS 58 02/27/2020 1207   BILITOT 0.4 02/27/2020 1207   GFRNONAA >60 02/27/2020 1207   GFRNONAA 95 11/18/2019 1119   GFRAA 110 11/18/2019 1119        ASSESSMENT and PLAN:   Malignant neoplasm of upper outer quadrant of female breast ([place]) 01/28/2020: Palpable right breast lump: Mammogram and ultrasound revealed calcifications spanning 2 cm, biopsy revealed IDC with DCIS, grade 2, ER 5% weak, PR 0%, HER2 equivocal by IHC, FISH pending, Ki-67 10%  Pathology and radiology counseling: Discussed with the patient, the details of pathology including the type of breast cancer,the clinical staging, the significance of ER, PR and HER-2/neu receptors and the implications for treatment. After reviewing the pathology in detail, we proceeded to discuss the different treatment options between surgery, radiation, chemotherapy, antiestrogen therapies.  BRCA2 positive: Risk discussion regarding future breast cancer, and ovarian cancer risk, discussed risk reducing bilateral mastectomies and RRSO.    Treatment plan: 1. Neo- adjuvant chemotherapy with dose dense Adriamycin and Cytoxan/Keytruda followed by Taxol and carboplatin 2. Breast conserving surgery with sentinel lymph node biopsy 3. Adjuvant radiation 4. followed by adjuvant antiestrogen therapy 5.  Genetic testing  _______________________________________________________________________  Current Treatment: Neoadjuvant chemotherapy with Adriamycin/Cytoxan/Keytruda, cycle 2 day 1 Echocardiogram 01/31/2020 EF 60-65%  Anjenette is doing well today.  Her labs are stable and she will proceed with her second cycle of neoadjuvant chemotherapy.  Her breast tumor is softer which is a sign of early clinical response to treatment.    Caera has BRCA 2 positivity.  She has already decided on bilateral  mastectomies at the time of surgery and will see Dr. Iran Planas next week.  She will let me know if I need to get her in with gyn-oncology to discuss RRSO/monitoring.    Levada Dy and I reviewed her overall treatment plan.  She was hoping to take a trip for the week of spring break with her children.  I let her know that she should certainly go ahead and plan that trip, and we will work her treatment around it.    Kadajah will return in 3 weeks for labs, f/u with Dr. Lindi Adie, and cycle 3 of treatment.  She knows to call for any questions that may arise between now and her next appointment.  We are happy to see her sooner if needed.     Total encounter time: 20 minutes*  Wilber Bihari, NP 02/27/20 1:10 PM Medical Oncology and Hematology [facility] [place], [place] [address] Tel. [phone]    Fax. [phone]  *Total Encounter Time as defined by the Centers for Medicare and Medicaid Services includes, in addition to the face-to-face time of a patient visit (documented in the note above) non-face-to-face time: obtaining and reviewing outside history, ordering and reviewing medications, tests or procedures, care coordination (communications with other health care professionals or caregivers) and documentation in the medical record.

## 2020-02-27 ENCOUNTER — Other Ambulatory Visit: Payer: Self-pay

## 2020-02-27 ENCOUNTER — Inpatient Hospital Stay: Payer: 59

## 2020-02-27 ENCOUNTER — Inpatient Hospital Stay (HOSPITAL_BASED_OUTPATIENT_CLINIC_OR_DEPARTMENT_OTHER): Payer: 59 | Admitting: Adult Health

## 2020-02-27 ENCOUNTER — Encounter: Payer: Self-pay | Admitting: Adult Health

## 2020-02-27 VITALS — BP 109/71 | HR 70 | Temp 97.5°F | Resp 18 | Ht 61.0 in | Wt 124.2 lb

## 2020-02-27 DIAGNOSIS — Z170 Estrogen receptor positive status [ER+]: Secondary | ICD-10-CM | POA: Diagnosis not present

## 2020-02-27 DIAGNOSIS — Z95828 Presence of other vascular implants and grafts: Secondary | ICD-10-CM

## 2020-02-27 DIAGNOSIS — C50411 Malignant neoplasm of upper-outer quadrant of right female breast: Secondary | ICD-10-CM

## 2020-02-27 DIAGNOSIS — E785 Hyperlipidemia, unspecified: Secondary | ICD-10-CM | POA: Diagnosis not present

## 2020-02-27 DIAGNOSIS — Z5111 Encounter for antineoplastic chemotherapy: Secondary | ICD-10-CM | POA: Diagnosis not present

## 2020-02-27 DIAGNOSIS — R5383 Other fatigue: Secondary | ICD-10-CM | POA: Diagnosis not present

## 2020-02-27 DIAGNOSIS — Z79899 Other long term (current) drug therapy: Secondary | ICD-10-CM | POA: Diagnosis not present

## 2020-02-27 DIAGNOSIS — Z5189 Encounter for other specified aftercare: Secondary | ICD-10-CM | POA: Diagnosis not present

## 2020-02-27 DIAGNOSIS — Z5112 Encounter for antineoplastic immunotherapy: Secondary | ICD-10-CM | POA: Diagnosis not present

## 2020-02-27 DIAGNOSIS — E538 Deficiency of other specified B group vitamins: Secondary | ICD-10-CM | POA: Diagnosis not present

## 2020-02-27 LAB — CMP (CANCER CENTER ONLY)
ALT: 26 U/L (ref 0–44)
AST: 32 U/L (ref 15–41)
Albumin: 4.1 g/dL (ref 3.5–5.0)
Alkaline Phosphatase: 58 U/L (ref 38–126)
Anion gap: 7 (ref 5–15)
BUN: 10 mg/dL (ref 6–20)
CO2: 26 mmol/L (ref 22–32)
Calcium: 9.2 mg/dL (ref 8.9–10.3)
Chloride: 103 mmol/L (ref 98–111)
Creatinine: 0.73 mg/dL (ref 0.44–1.00)
GFR, Estimated: >60 mL/min (ref >60)
Glucose, Bld: 86 mg/dL (ref 70–99)
Potassium: 3.9 mmol/L (ref 3.5–5.1)
Sodium: 136 mmol/L (ref 135–145)
Total Bilirubin: 0.4 mg/dL (ref 0.3–1.2)
Total Protein: 7.7 g/dL (ref 6.5–8.1)

## 2020-02-27 LAB — CBC WITH DIFFERENTIAL (CANCER CENTER ONLY)
Abs Immature Granulocytes: 0.05 10*3/uL (ref 0.00–0.07)
Basophils Absolute: 0.1 10*3/uL (ref 0.0–0.1)
Basophils Relative: 1 %
Eosinophils Absolute: 0 10*3/uL (ref 0.0–0.5)
Eosinophils Relative: 0 %
HCT: 34.2 % — ABNORMAL LOW (ref 36.0–46.0)
Hemoglobin: 11.3 g/dL — ABNORMAL LOW (ref 12.0–15.0)
Immature Granulocytes: 1 %
Lymphocytes Relative: 25 %
Lymphs Abs: 1.9 10*3/uL (ref 0.7–4.0)
MCH: 29.4 pg (ref 26.0–34.0)
MCHC: 33 g/dL (ref 30.0–36.0)
MCV: 89.1 fL (ref 80.0–100.0)
Monocytes Absolute: 0.9 10*3/uL (ref 0.1–1.0)
Monocytes Relative: 12 %
Neutro Abs: 4.6 10*3/uL (ref 1.7–7.7)
Neutrophils Relative %: 61 %
Platelet Count: 397 10*3/uL (ref 150–400)
RBC: 3.84 MIL/uL — ABNORMAL LOW (ref 3.87–5.11)
RDW: 12.9 % (ref 11.5–15.5)
WBC Count: 7.6 10*3/uL (ref 4.0–10.5)
nRBC: 0 % (ref 0.0–0.2)

## 2020-02-27 LAB — PREGNANCY, URINE: Preg Test, Ur: NEGATIVE

## 2020-02-27 MED ORDER — PALONOSETRON HCL INJECTION 0.25 MG/5ML
0.2500 mg | Freq: Once | INTRAVENOUS | Status: AC
  Administered 2020-02-27: 0.25 mg via INTRAVENOUS

## 2020-02-27 MED ORDER — PALONOSETRON HCL INJECTION 0.25 MG/5ML
INTRAVENOUS | Status: AC
  Filled 2020-02-27: qty 5

## 2020-02-27 MED ORDER — SODIUM CHLORIDE 0.9 % IV SOLN
150.0000 mg | Freq: Once | INTRAVENOUS | Status: AC
  Administered 2020-02-27: 150 mg via INTRAVENOUS
  Filled 2020-02-27: qty 150

## 2020-02-27 MED ORDER — SODIUM CHLORIDE 0.9 % IV SOLN
10.0000 mg | Freq: Once | INTRAVENOUS | Status: AC
  Administered 2020-02-27: 10 mg via INTRAVENOUS
  Filled 2020-02-27: qty 10

## 2020-02-27 MED ORDER — SODIUM CHLORIDE 0.9 % IV SOLN
200.0000 mg | Freq: Once | INTRAVENOUS | Status: AC
  Administered 2020-02-27: 200 mg via INTRAVENOUS
  Filled 2020-02-27: qty 8

## 2020-02-27 MED ORDER — SODIUM CHLORIDE 0.9 % IV SOLN
600.0000 mg/m2 | Freq: Once | INTRAVENOUS | Status: AC
  Administered 2020-02-27: 940 mg via INTRAVENOUS
  Filled 2020-02-27: qty 47

## 2020-02-27 MED ORDER — SODIUM CHLORIDE 0.9% FLUSH
10.0000 mL | INTRAVENOUS | Status: DC | PRN
  Administered 2020-02-27: 10 mL via INTRAVENOUS
  Filled 2020-02-27: qty 10

## 2020-02-27 MED ORDER — SODIUM CHLORIDE 0.9 % IV SOLN
Freq: Once | INTRAVENOUS | Status: AC
  Filled 2020-02-27: qty 250

## 2020-02-27 MED ORDER — SODIUM CHLORIDE 0.9% FLUSH
10.0000 mL | INTRAVENOUS | Status: DC | PRN
  Administered 2020-02-27: 10 mL
  Filled 2020-02-27: qty 10

## 2020-02-27 MED ORDER — HEPARIN SOD (PORK) LOCK FLUSH 100 UNIT/ML IV SOLN
500.0000 [IU] | Freq: Once | INTRAVENOUS | Status: AC | PRN
  Administered 2020-02-27: 500 [IU]
  Filled 2020-02-27: qty 5

## 2020-02-27 MED ORDER — DOXORUBICIN HCL CHEMO IV INJECTION 2 MG/ML
60.0000 mg/m2 | Freq: Once | INTRAVENOUS | Status: AC
  Administered 2020-02-27: 94 mg via INTRAVENOUS
  Filled 2020-02-27: qty 47

## 2020-02-27 MED FILL — ESCITALOPRAM 5 MG TABLET: 5 | 90 days supply | Qty: 90 | Fill #0

## 2020-02-27 NOTE — Patient Instructions (Signed)
Implanted Port Insertion, Care After This sheet gives you information about how to care for yourself after your procedure. Your health care provider may also give you more specific instructions. If you have problems or questions, contact your health care provider. What can I expect after the procedure? After the procedure, it is common to have:  Discomfort at the port insertion site.  Bruising on the skin over the port. This should improve over 3-4 days. Follow these instructions at home: Port care  After your port is placed, you will get a manufacturer's information card. The card has information about your port. Keep this card with you at all times.  Take care of the port as told by your health care provider. Ask your health care provider if you or a family member can get training for taking care of the port at home. A home health care nurse may also take care of the port.  Make sure to remember what type of port you have. Incision care  Follow instructions from your health care provider about how to take care of your port insertion site. Make sure you: ? Wash your hands with soap and water before and after you change your bandage (dressing). If soap and water are not available, use hand sanitizer. ? Change your dressing as told by your health care provider. ? Leave stitches (sutures), skin glue, or adhesive strips in place. These skin closures may need to stay in place for 2 weeks or longer. If adhesive strip edges start to loosen and curl up, you may trim the loose edges. Do not remove adhesive strips completely unless your health care provider tells you to do that.  Check your port insertion site every day for signs of infection. Check for: ? Redness, swelling, or pain. ? Fluid or blood. ? Warmth. ? Pus or a bad smell.      Activity  Return to your normal activities as told by your health care provider. Ask your health care provider what activities are safe for you.  Do not  lift anything that is heavier than 10 lb (4.5 kg), or the limit that you are told, until your health care provider says that it is safe. General instructions  Take over-the-counter and prescription medicines only as told by your health care provider.  Do not take baths, swim, or use a hot tub until your health care provider approves. Ask your health care provider if you may take showers. You may only be allowed to take sponge baths.  Do not drive for 24 hours if you were given a sedative during your procedure.  Wear a medical alert bracelet in case of an emergency. This will tell any health care providers that you have a port.  Keep all follow-up visits as told by your health care provider. This is important. Contact a health care provider if:  You cannot flush your port with saline as directed, or you cannot draw blood from the port.  You have a fever or chills.  You have redness, swelling, or pain around your port insertion site.  You have fluid or blood coming from your port insertion site.  Your port insertion site feels warm to the touch.  You have pus or a bad smell coming from the port insertion site. Get help right away if:  You have chest pain or shortness of breath.  You have bleeding from your port that you cannot control. Summary  Take care of the port as told by your   health care provider. Keep the manufacturer's information card with you at all times.  Change your dressing as told by your health care provider.  Contact a health care provider if you have a fever or chills or if you have redness, swelling, or pain around your port insertion site.  Keep all follow-up visits as told by your health care provider. This information is not intended to replace advice given to you by your health care provider. Make sure you discuss any questions you have with your health care provider. Document Revised: 07/25/2017 Document Reviewed: 07/25/2017 Elsevier Patient Education   2021 Elsevier Inc.  

## 2020-02-27 NOTE — Patient Instructions (Signed)
[facility] Discharge Instructions for Patients Receiving Chemotherapy  Today you received the following chemotherapy agents: pembrolizumab/doxorubicin/cyclophosphamide.  To help prevent nausea and vomiting after your treatment, we encourage you to take your nausea medication as directed.   If you develop nausea and vomiting that is not controlled by your nausea medication, call the clinic.   BELOW ARE SYMPTOMS THAT SHOULD BE REPORTED IMMEDIATELY:  *FEVER GREATER THAN 100.5 F  *CHILLS WITH OR WITHOUT FEVER  NAUSEA AND VOMITING THAT IS NOT CONTROLLED WITH YOUR NAUSEA MEDICATION  *UNUSUAL SHORTNESS OF BREATH  *UNUSUAL BRUISING OR BLEEDING  TENDERNESS IN MOUTH AND THROAT WITH OR WITHOUT PRESENCE OF ULCERS  *URINARY PROBLEMS  *BOWEL PROBLEMS  UNUSUAL RASH Items with * indicate a potential emergency and should be followed up as soon as possible.  Feel free to call the clinic should you have any questions or concerns. The clinic phone number is [phone].  Please show the CHEMO ALERT CARD at check-in to the Emergency Department and triage nurse.   

## 2020-02-28 ENCOUNTER — Telehealth: Payer: Self-pay | Admitting: Adult Health

## 2020-02-28 NOTE — Telephone Encounter (Signed)
No 2/17 los. No changes made to pt's schedule.

## 2020-02-29 ENCOUNTER — Other Ambulatory Visit: Payer: Self-pay

## 2020-02-29 ENCOUNTER — Inpatient Hospital Stay: Payer: 59

## 2020-02-29 VITALS — BP 120/72 | HR 83 | Temp 97.8°F | Resp 16 | Ht 61.0 in

## 2020-02-29 DIAGNOSIS — R5383 Other fatigue: Secondary | ICD-10-CM | POA: Diagnosis not present

## 2020-02-29 DIAGNOSIS — Z170 Estrogen receptor positive status [ER+]: Secondary | ICD-10-CM | POA: Diagnosis not present

## 2020-02-29 DIAGNOSIS — E785 Hyperlipidemia, unspecified: Secondary | ICD-10-CM | POA: Diagnosis not present

## 2020-02-29 DIAGNOSIS — Z5111 Encounter for antineoplastic chemotherapy: Secondary | ICD-10-CM | POA: Diagnosis not present

## 2020-02-29 DIAGNOSIS — C50411 Malignant neoplasm of upper-outer quadrant of right female breast: Secondary | ICD-10-CM | POA: Diagnosis not present

## 2020-02-29 DIAGNOSIS — Z5112 Encounter for antineoplastic immunotherapy: Secondary | ICD-10-CM | POA: Diagnosis not present

## 2020-02-29 DIAGNOSIS — E538 Deficiency of other specified B group vitamins: Secondary | ICD-10-CM | POA: Diagnosis not present

## 2020-02-29 DIAGNOSIS — Z5189 Encounter for other specified aftercare: Secondary | ICD-10-CM | POA: Diagnosis not present

## 2020-02-29 DIAGNOSIS — Z79899 Other long term (current) drug therapy: Secondary | ICD-10-CM | POA: Diagnosis not present

## 2020-02-29 MED ORDER — PEGFILGRASTIM-BMEZ 6 MG/0.6ML ~~LOC~~ SOSY
6.0000 mg | PREFILLED_SYRINGE | Freq: Once | SUBCUTANEOUS | Status: AC
  Administered 2020-02-29: 6 mg via SUBCUTANEOUS

## 2020-03-04 DIAGNOSIS — Z1509 Genetic susceptibility to other malignant neoplasm: Secondary | ICD-10-CM | POA: Diagnosis not present

## 2020-03-04 DIAGNOSIS — Z1501 Genetic susceptibility to malignant neoplasm of breast: Secondary | ICD-10-CM | POA: Diagnosis not present

## 2020-03-04 DIAGNOSIS — Z170 Estrogen receptor positive status [ER+]: Secondary | ICD-10-CM | POA: Diagnosis not present

## 2020-03-04 DIAGNOSIS — C50411 Malignant neoplasm of upper-outer quadrant of right female breast: Secondary | ICD-10-CM | POA: Diagnosis not present

## 2020-03-05 ENCOUNTER — Other Ambulatory Visit: Payer: 59

## 2020-03-05 ENCOUNTER — Ambulatory Visit: Payer: 59

## 2020-03-05 ENCOUNTER — Ambulatory Visit: Payer: 59 | Admitting: Hematology and Oncology

## 2020-03-07 ENCOUNTER — Ambulatory Visit: Payer: 59

## 2020-03-17 DIAGNOSIS — Z8481 Family history of carrier of genetic disease: Secondary | ICD-10-CM | POA: Diagnosis not present

## 2020-03-17 DIAGNOSIS — Z6823 Body mass index (BMI) 23.0-23.9, adult: Secondary | ICD-10-CM | POA: Diagnosis not present

## 2020-03-17 DIAGNOSIS — Z01419 Encounter for gynecological examination (general) (routine) without abnormal findings: Secondary | ICD-10-CM | POA: Diagnosis not present

## 2020-03-17 DIAGNOSIS — Z7689 Persons encountering health services in other specified circumstances: Secondary | ICD-10-CM | POA: Diagnosis not present

## 2020-03-18 ENCOUNTER — Other Ambulatory Visit: Payer: 59

## 2020-03-18 ENCOUNTER — Ambulatory Visit: Payer: 59 | Admitting: Hematology and Oncology

## 2020-03-18 ENCOUNTER — Ambulatory Visit: Payer: 59

## 2020-03-18 NOTE — Progress Notes (Signed)
Patient Care Team: Emeterio Reeve, DO as PCP - General (Osteopathic Medicine) [name], MD as Consulting Physician (Obstetrics and Gynecology) Mauro Kaufmann, RN as Oncology Nurse Navigator Rockwell Germany, RN as Oncology Nurse Navigator  DIAGNOSIS:    ICD-10-CM   1. Malignant neoplasm of upper-outer quadrant of right breast in female, estrogen receptor positive ([place])  C50.411    Z17.0     SUMMARY OF ONCOLOGIC HISTORY: Oncology History  Malignant neoplasm of upper outer quadrant of female breast ([place])  01/28/2020 Initial Diagnosis   Patient palpated a right breast lump. Diagnostic mammogram and US showed calcifications spanning 2.0cm in the right breast. Biopsy showed invasive and in situ carcinoma, grade 2. 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 Testing was completed through the CustomNext-Cancer + RNAinsight panel offered by [facility]. 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 The report date is 02/13/2020.  The CustomNext-Cancer+RNAinsight panel offered by [facility] includes sequencing and rearrangement analysis for the following 47 genes:  APC, ATM, AXIN2, BARD1, BMPR1A, BRCA1, BRCA2, BRIP1, CDH1, CDK4, CDKN2A, CHEK2, DICER1, EPCAM, GREM1, HOXB13, MEN1, MLH1, MSH2, MSH3, MSH6, MUTYH, NBN, NF1, NF2, NTHL1, PALB2, PMS2, POLD1, POLE, PTEN, RAD51C, RAD51D, RECQL, RET, SDHA, SDHAF2, SDHB, SDHC,  SDHD, SMAD4, SMARCA4, STK11, TP53, TSC1, TSC2, and VHL.  RNA data is routinely analyzed for use in variant interpretation for all genes.     CHIEF COMPLIANT: Cycle 3 Adriamycin/Cytoxan/Pembrolizumab  INTERVAL HISTORY: Sydney Herman is a 40 y.o. with above-mentioned history of invasive ductal carcinoma of the right breast currently on neoadjuvant chemotherapy with Adriamycin/Cytoxan/Pembrolizumab. She presents to the clinic today for treatment. She has been handling chemotherapy extremely well.  She takes the nausea preventive medications and so far has not had any problems with nausea vomiting.  She lost her hair to chemotherapy.  Denies any problems with mouth sores.  Mild constipation.  ALLERGIES:  is allergic to amoxil [amoxicillin].  MEDICATIONS:  Current Outpatient Medications  Medication Sig Dispense Refill  . benzonatate (TESSALON) 100 MG capsule Take 1 capsule (100 mg total) by mouth 2 (two) times daily as needed for cough. (Patient not taking: Reported on 02/27/2020) 20 capsule 0  . escitalopram (LEXAPRO) 5 MG tablet Take 1 tablet (5 mg total) by mouth daily. 90 tablet 3  . lidocaine-prilocaine (EMLA) cream Apply to affected area once 30 g 3  . LORazepam (ATIVAN) 0.5 MG tablet Take 1 tablet (0.5 mg total) by mouth at bedtime as needed (Nausea or vomiting). 30 tablet 0  . ondansetron (ZOFRAN) 8 MG tablet Take 1 tablet (8 mg total) by mouth 2 (two) times daily as needed. Start on the third day after chemotherapy. 30 tablet 1  . prochlorperazine (COMPAZINE) 10 MG tablet Take 1 tablet (10 mg total) by mouth every 6 (six) hours as needed (Nausea or vomiting). 30 tablet 1  . RESTASIS 0.05 % ophthalmic emulsion Place 1  drop into the right eye 2 (two) times daily.     No current facility-administered medications for this visit.    PHYSICAL EXAMINATION: ECOG PERFORMANCE STATUS: 1 - Symptomatic but completely ambulatory  Vitals:   03/19/20 0909  BP: 103/73  Pulse: 76  Resp: 19  Temp:  (!) 97.5 F (36.4 C)  SpO2: 100%   Filed Weights   03/19/20 0909  Weight: 124 lb 1.6 oz (56.3 kg)     LABORATORY DATA:  I have reviewed the data as listed CMP Latest Ref Rng & Units 02/27/2020 02/13/2020 02/03/2020  Glucose 70 - 99 mg/dL 86 111(H) 90  BUN 6 - 20 mg/dL _0 Creatinine 0.44 - 1.00 mg/dL 0.73 0.73 0.83  Sodium 135 - 145 mmol/L 136 140 138  Potassium 3.5 - 5.1 mmol/L 3.9 3.7 3.7  Chloride 98 - 111 mmol/L 103 103 104  CO2 22 - 32 mmol/L _1 Calcium 8.9 - 10.3 mg/dL 9.2 9.7 9.5  Total Protein 6.5 - 8.1 g/dL 7.7 7.7 8.6(H)  Total Bilirubin 0.3 - 1.2 mg/dL 0.4 <0.2(L) 0.2(L)  Alkaline Phos 38 - 126 U/L 58 64 50  AST 15 - 41 U/L 32 22 27  ALT 0 - 44 U/L _2 Lab Results  Component Value Date   WBC 6.7 03/19/2020   HGB 11.1 (L) 03/19/2020   HCT 33.1 (L) 03/19/2020   MCV 88.7 03/19/2020   PLT 287 03/19/2020   NEUTROABS 4.4 03/19/2020    ASSESSMENT & PLAN:  Malignant neoplasm of upper outer quadrant of female breast ([place]) 01/28/2020: Palpable right breast lump: Mammogram and ultrasound revealed calcifications spanning 2 cm, biopsy revealed IDC with DCIS, grade 2, ER 5% weak, PR 0%, HER2 equivocal by IHC, FISH negative ratio 1.67 Ki-67 10%   BRCA2 positive: Risk discussion regarding future breast cancer, and ovarian cancer risk, discussed risk reducing bilateral mastectomies and RRSO.    Treatment plan: 1. Neo- adjuvant chemotherapy with dose dense Adriamycin and Cytoxan/Keytruda followed by Taxol and carboplatin 2. bilateral mastectomies with sentinel lymph node biopsy 3. Adjuvant radiation 4. followed by adjuvant antiestrogen therapy 5.  Genetic testing _______________________________________________________________________ Current Treatment: Neoadjuvant chemotherapy with Adriamycin/Cytoxan/Keytruda, cycle 3 day 1 Echocardiogram 01/31/2020 EF 60-65%  Chemo toxicities: 1.  Alopecia 2. fatigue the first week after chemo 3.  Chemotherapy-induced  anemia: Hemoglobin 11.1: Monitoring  Patient is still working part-time. She is looking forward to going on a couple of beach trips.  Return to clinic in 3 weeks for cycle 4    No orders of the defined types were placed in this encounter.  The patient has a good understanding of the overall plan. she agrees with it. she will call with any problems that may develop before the next visit here.  Total time spent: 30 mins including face to face time and time spent for planning, charting and coordination of care  Rulon Eisenmenger, MD, MPH 03/19/2020  I, Molly Dorshimer, am acting as scribe for Dr. Nicholas Lose.  I have reviewed the above documentation for accuracy and completeness, and I agree with the above.

## 2020-03-19 ENCOUNTER — Other Ambulatory Visit: Payer: Self-pay

## 2020-03-19 ENCOUNTER — Inpatient Hospital Stay: Payer: 59 | Attending: Hematology and Oncology

## 2020-03-19 ENCOUNTER — Inpatient Hospital Stay: Payer: 59

## 2020-03-19 ENCOUNTER — Inpatient Hospital Stay (HOSPITAL_BASED_OUTPATIENT_CLINIC_OR_DEPARTMENT_OTHER): Payer: 59 | Admitting: Hematology and Oncology

## 2020-03-19 DIAGNOSIS — C50411 Malignant neoplasm of upper-outer quadrant of right female breast: Secondary | ICD-10-CM | POA: Diagnosis not present

## 2020-03-19 DIAGNOSIS — Z170 Estrogen receptor positive status [ER+]: Secondary | ICD-10-CM | POA: Diagnosis not present

## 2020-03-19 DIAGNOSIS — Z8049 Family history of malignant neoplasm of other genital organs: Secondary | ICD-10-CM | POA: Diagnosis not present

## 2020-03-19 DIAGNOSIS — K5900 Constipation, unspecified: Secondary | ICD-10-CM | POA: Diagnosis not present

## 2020-03-19 DIAGNOSIS — Z79899 Other long term (current) drug therapy: Secondary | ICD-10-CM | POA: Diagnosis not present

## 2020-03-19 DIAGNOSIS — R110 Nausea: Secondary | ICD-10-CM | POA: Diagnosis not present

## 2020-03-19 DIAGNOSIS — Z833 Family history of diabetes mellitus: Secondary | ICD-10-CM | POA: Diagnosis not present

## 2020-03-19 DIAGNOSIS — T451X5A Adverse effect of antineoplastic and immunosuppressive drugs, initial encounter: Secondary | ICD-10-CM | POA: Diagnosis not present

## 2020-03-19 DIAGNOSIS — Z8042 Family history of malignant neoplasm of prostate: Secondary | ICD-10-CM | POA: Diagnosis not present

## 2020-03-19 DIAGNOSIS — Z95828 Presence of other vascular implants and grafts: Secondary | ICD-10-CM | POA: Insufficient documentation

## 2020-03-19 DIAGNOSIS — Z880 Allergy status to penicillin: Secondary | ICD-10-CM | POA: Diagnosis not present

## 2020-03-19 DIAGNOSIS — L659 Nonscarring hair loss, unspecified: Secondary | ICD-10-CM | POA: Diagnosis not present

## 2020-03-19 DIAGNOSIS — Z803 Family history of malignant neoplasm of breast: Secondary | ICD-10-CM | POA: Insufficient documentation

## 2020-03-19 DIAGNOSIS — Z5111 Encounter for antineoplastic chemotherapy: Secondary | ICD-10-CM | POA: Insufficient documentation

## 2020-03-19 DIAGNOSIS — E785 Hyperlipidemia, unspecified: Secondary | ICD-10-CM | POA: Insufficient documentation

## 2020-03-19 DIAGNOSIS — Z8249 Family history of ischemic heart disease and other diseases of the circulatory system: Secondary | ICD-10-CM | POA: Diagnosis not present

## 2020-03-19 DIAGNOSIS — Z800 Family history of malignant neoplasm of digestive organs: Secondary | ICD-10-CM | POA: Insufficient documentation

## 2020-03-19 DIAGNOSIS — R5383 Other fatigue: Secondary | ICD-10-CM | POA: Diagnosis not present

## 2020-03-19 DIAGNOSIS — Z5112 Encounter for antineoplastic immunotherapy: Secondary | ICD-10-CM | POA: Diagnosis not present

## 2020-03-19 DIAGNOSIS — D6481 Anemia due to antineoplastic chemotherapy: Secondary | ICD-10-CM | POA: Diagnosis not present

## 2020-03-19 DIAGNOSIS — Z5189 Encounter for other specified aftercare: Secondary | ICD-10-CM | POA: Insufficient documentation

## 2020-03-19 LAB — CBC WITH DIFFERENTIAL (CANCER CENTER ONLY)
Abs Immature Granulocytes: 0.03 10*3/uL (ref 0.00–0.07)
Basophils Absolute: 0 10*3/uL (ref 0.0–0.1)
Basophils Relative: 1 %
Eosinophils Absolute: 0 10*3/uL (ref 0.0–0.5)
Eosinophils Relative: 0 %
HCT: 33.1 % — ABNORMAL LOW (ref 36.0–46.0)
Hemoglobin: 11.1 g/dL — ABNORMAL LOW (ref 12.0–15.0)
Immature Granulocytes: 0 %
Lymphocytes Relative: 22 %
Lymphs Abs: 1.5 10*3/uL (ref 0.7–4.0)
MCH: 29.8 pg (ref 26.0–34.0)
MCHC: 33.5 g/dL (ref 30.0–36.0)
MCV: 88.7 fL (ref 80.0–100.0)
Monocytes Absolute: 0.8 10*3/uL (ref 0.1–1.0)
Monocytes Relative: 12 %
Neutro Abs: 4.4 10*3/uL (ref 1.7–7.7)
Neutrophils Relative %: 65 %
Platelet Count: 287 10*3/uL (ref 150–400)
RBC: 3.73 MIL/uL — ABNORMAL LOW (ref 3.87–5.11)
RDW: 14.2 % (ref 11.5–15.5)
WBC Count: 6.7 10*3/uL (ref 4.0–10.5)
nRBC: 0 % (ref 0.0–0.2)

## 2020-03-19 LAB — CMP (CANCER CENTER ONLY)
ALT: 28 U/L (ref 0–44)
AST: 31 U/L (ref 15–41)
Albumin: 4.4 g/dL (ref 3.5–5.0)
Alkaline Phosphatase: 68 U/L (ref 38–126)
Anion gap: 11 (ref 5–15)
BUN: 11 mg/dL (ref 6–20)
CO2: 23 mmol/L (ref 22–32)
Calcium: 9.2 mg/dL (ref 8.9–10.3)
Chloride: 106 mmol/L (ref 98–111)
Creatinine: 0.77 mg/dL (ref 0.44–1.00)
GFR, Estimated: >60 mL/min (ref >60)
Glucose, Bld: 91 mg/dL (ref 70–99)
Potassium: 3.9 mmol/L (ref 3.5–5.1)
Sodium: 140 mmol/L (ref 135–145)
Total Bilirubin: 0.3 mg/dL (ref 0.3–1.2)
Total Protein: 7.7 g/dL (ref 6.5–8.1)

## 2020-03-19 LAB — PREGNANCY, URINE: Preg Test, Ur: NEGATIVE

## 2020-03-19 LAB — TSH: TSH: 0.451 u[IU]/mL (ref 0.308–3.960)

## 2020-03-19 MED ORDER — SODIUM CHLORIDE 0.9 % IV SOLN
600.0000 mg/m2 | Freq: Once | INTRAVENOUS | Status: AC
  Administered 2020-03-19: 940 mg via INTRAVENOUS
  Filled 2020-03-19: qty 47

## 2020-03-19 MED ORDER — DOXORUBICIN HCL CHEMO IV INJECTION 2 MG/ML
60.0000 mg/m2 | Freq: Once | INTRAVENOUS | Status: AC
  Administered 2020-03-19: 94 mg via INTRAVENOUS
  Filled 2020-03-19: qty 47

## 2020-03-19 MED ORDER — HEPARIN SOD (PORK) LOCK FLUSH 100 UNIT/ML IV SOLN
500.0000 [IU] | Freq: Once | INTRAVENOUS | Status: AC | PRN
  Administered 2020-03-19: 500 [IU]
  Filled 2020-03-19: qty 5

## 2020-03-19 MED ORDER — PALONOSETRON HCL INJECTION 0.25 MG/5ML
0.2500 mg | Freq: Once | INTRAVENOUS | Status: AC
  Administered 2020-03-19: 0.25 mg via INTRAVENOUS

## 2020-03-19 MED ORDER — SODIUM CHLORIDE 0.9% FLUSH
10.0000 mL | Freq: Once | INTRAVENOUS | Status: AC
  Administered 2020-03-19: 10 mL
  Filled 2020-03-19: qty 10

## 2020-03-19 MED ORDER — SODIUM CHLORIDE 0.9% FLUSH
10.0000 mL | INTRAVENOUS | Status: DC | PRN
  Administered 2020-03-19: 10 mL
  Filled 2020-03-19: qty 10

## 2020-03-19 MED ORDER — FOSAPREPITANT DIMEGLUMINE INJECTION 150 MG
150.0000 mg | Freq: Once | INTRAVENOUS | Status: AC
  Administered 2020-03-19: 150 mg via INTRAVENOUS
  Filled 2020-03-19: qty 150

## 2020-03-19 MED ORDER — SODIUM CHLORIDE 0.9 % IV SOLN
200.0000 mg | Freq: Once | INTRAVENOUS | Status: AC
  Administered 2020-03-19: 200 mg via INTRAVENOUS
  Filled 2020-03-19: qty 8

## 2020-03-19 MED ORDER — SODIUM CHLORIDE 0.9 % IV SOLN
10.0000 mg | Freq: Once | INTRAVENOUS | Status: AC
  Administered 2020-03-19: 10 mg via INTRAVENOUS
  Filled 2020-03-19: qty 10

## 2020-03-19 MED ORDER — SODIUM CHLORIDE 0.9 % IV SOLN
Freq: Once | INTRAVENOUS | Status: AC
  Filled 2020-03-19: qty 250

## 2020-03-19 MED ORDER — PALONOSETRON HCL INJECTION 0.25 MG/5ML
INTRAVENOUS | Status: AC
  Filled 2020-03-19: qty 5

## 2020-03-19 NOTE — Assessment & Plan Note (Signed)
01/28/2020: Palpable right breast lump: Mammogram and ultrasound revealed calcifications spanning 2 cm, biopsy revealed IDC with DCIS, grade 2, ER 5% weak, PR 0%, HER2 equivocal by IHC, FISH negative ratio 1.67 Ki-67 10%   BRCA2 positive: Risk discussion regarding future breast cancer, and ovarian cancer risk, discussed risk reducing bilateral mastectomies and RRSO.    Treatment plan: 1. Neo- adjuvant chemotherapy with dose dense Adriamycin and Cytoxan/Keytruda followed by Taxol and carboplatin 2. bilateral mastectomies with sentinel lymph node biopsy 3. Adjuvant radiation 4. followed by adjuvant antiestrogen therapy 5.  Genetic testing _______________________________________________________________________ Current Treatment: Neoadjuvant chemotherapy with Adriamycin/Cytoxan/Keytruda, cycle 3 day 1 Echocardiogram 01/31/2020 EF 60-65%  Chemo toxicities:  Return to clinic in 3 weeks for cycle 4

## 2020-03-19 NOTE — Patient Instructions (Signed)
[facility] Discharge Instructions for Patients Receiving Chemotherapy  Today you received the following chemotherapy agents: Keytruda/Adriamycin/Cytoxan.  To help prevent nausea and vomiting after your treatment, we encourage you to take your nausea medication as directed.   If you develop nausea and vomiting that is not controlled by your nausea medication, call the clinic.   BELOW ARE SYMPTOMS THAT SHOULD BE REPORTED IMMEDIATELY:  *FEVER GREATER THAN 100.5 F  *CHILLS WITH OR WITHOUT FEVER  NAUSEA AND VOMITING THAT IS NOT CONTROLLED WITH YOUR NAUSEA MEDICATION  *UNUSUAL SHORTNESS OF BREATH  *UNUSUAL BRUISING OR BLEEDING  TENDERNESS IN MOUTH AND THROAT WITH OR WITHOUT PRESENCE OF ULCERS  *URINARY PROBLEMS  *BOWEL PROBLEMS  UNUSUAL RASH Items with * indicate a potential emergency and should be followed up as soon as possible.  Feel free to call the clinic should you have any questions or concerns. The clinic phone number is [phone].  Please show the [facility] [facility] at check-in to the Emergency Department and triage nurse.

## 2020-03-20 ENCOUNTER — Ambulatory Visit: Payer: 59

## 2020-03-21 ENCOUNTER — Other Ambulatory Visit: Payer: Self-pay

## 2020-03-21 ENCOUNTER — Inpatient Hospital Stay: Payer: 59

## 2020-03-21 VITALS — BP 100/69 | HR 67 | Temp 97.8°F | Resp 20

## 2020-03-21 DIAGNOSIS — K5900 Constipation, unspecified: Secondary | ICD-10-CM | POA: Diagnosis not present

## 2020-03-21 DIAGNOSIS — C50411 Malignant neoplasm of upper-outer quadrant of right female breast: Secondary | ICD-10-CM

## 2020-03-21 DIAGNOSIS — Z5111 Encounter for antineoplastic chemotherapy: Secondary | ICD-10-CM | POA: Diagnosis not present

## 2020-03-21 DIAGNOSIS — R110 Nausea: Secondary | ICD-10-CM | POA: Diagnosis not present

## 2020-03-21 DIAGNOSIS — L659 Nonscarring hair loss, unspecified: Secondary | ICD-10-CM | POA: Diagnosis not present

## 2020-03-21 DIAGNOSIS — Z5112 Encounter for antineoplastic immunotherapy: Secondary | ICD-10-CM | POA: Diagnosis not present

## 2020-03-21 DIAGNOSIS — Z170 Estrogen receptor positive status [ER+]: Secondary | ICD-10-CM | POA: Diagnosis not present

## 2020-03-21 DIAGNOSIS — R5383 Other fatigue: Secondary | ICD-10-CM | POA: Diagnosis not present

## 2020-03-21 DIAGNOSIS — Z5189 Encounter for other specified aftercare: Secondary | ICD-10-CM | POA: Diagnosis not present

## 2020-03-21 MED ORDER — PEGFILGRASTIM-BMEZ 6 MG/0.6ML ~~LOC~~ SOSY
6.0000 mg | PREFILLED_SYRINGE | Freq: Once | SUBCUTANEOUS | Status: AC
Start: 2020-03-21 — End: 2020-03-21
  Administered 2020-03-21: 6 mg via SUBCUTANEOUS

## 2020-03-23 ENCOUNTER — Telehealth: Payer: Self-pay | Admitting: Hematology and Oncology

## 2020-03-23 NOTE — Telephone Encounter (Signed)
Scheduled per 3/10 los. Pt will receive an updated appt calendar per next visit appt notes

## 2020-03-25 ENCOUNTER — Telehealth: Payer: Self-pay | Admitting: *Deleted

## 2020-03-25 NOTE — Telephone Encounter (Signed)
Pt called to requesting taxol/carb tx to be moved from Thursdays to Tuesdays.  Scheduling msg sent to 4/19.

## 2020-03-30 ENCOUNTER — Encounter: Payer: Self-pay | Admitting: Adult Health

## 2020-03-30 ENCOUNTER — Other Ambulatory Visit: Payer: Self-pay

## 2020-03-30 DIAGNOSIS — Z170 Estrogen receptor positive status [ER+]: Secondary | ICD-10-CM

## 2020-03-30 DIAGNOSIS — Z1501 Genetic susceptibility to malignant neoplasm of breast: Secondary | ICD-10-CM

## 2020-03-30 DIAGNOSIS — Z1509 Genetic susceptibility to other malignant neoplasm: Secondary | ICD-10-CM

## 2020-03-30 DIAGNOSIS — C50411 Malignant neoplasm of upper-outer quadrant of right female breast: Secondary | ICD-10-CM

## 2020-03-31 DIAGNOSIS — Z853 Personal history of malignant neoplasm of breast: Secondary | ICD-10-CM | POA: Diagnosis not present

## 2020-04-09 ENCOUNTER — Other Ambulatory Visit: Payer: 59

## 2020-04-09 ENCOUNTER — Ambulatory Visit: Payer: 59

## 2020-04-09 ENCOUNTER — Inpatient Hospital Stay: Payer: 59

## 2020-04-09 ENCOUNTER — Encounter: Payer: Self-pay | Admitting: Adult Health

## 2020-04-09 ENCOUNTER — Ambulatory Visit: Payer: 59 | Admitting: Adult Health

## 2020-04-09 ENCOUNTER — Inpatient Hospital Stay: Payer: 59 | Admitting: Adult Health

## 2020-04-09 ENCOUNTER — Encounter: Payer: Self-pay | Admitting: *Deleted

## 2020-04-09 ENCOUNTER — Other Ambulatory Visit: Payer: Self-pay

## 2020-04-09 VITALS — BP 107/73 | HR 79 | Temp 98.1°F | Resp 20 | Ht 61.0 in | Wt 123.3 lb

## 2020-04-09 DIAGNOSIS — C50411 Malignant neoplasm of upper-outer quadrant of right female breast: Secondary | ICD-10-CM

## 2020-04-09 DIAGNOSIS — K5900 Constipation, unspecified: Secondary | ICD-10-CM | POA: Diagnosis not present

## 2020-04-09 DIAGNOSIS — Z95828 Presence of other vascular implants and grafts: Secondary | ICD-10-CM

## 2020-04-09 DIAGNOSIS — Z170 Estrogen receptor positive status [ER+]: Secondary | ICD-10-CM

## 2020-04-09 DIAGNOSIS — R110 Nausea: Secondary | ICD-10-CM | POA: Diagnosis not present

## 2020-04-09 DIAGNOSIS — L659 Nonscarring hair loss, unspecified: Secondary | ICD-10-CM | POA: Diagnosis not present

## 2020-04-09 DIAGNOSIS — Z5112 Encounter for antineoplastic immunotherapy: Secondary | ICD-10-CM | POA: Diagnosis not present

## 2020-04-09 DIAGNOSIS — Z5189 Encounter for other specified aftercare: Secondary | ICD-10-CM | POA: Diagnosis not present

## 2020-04-09 DIAGNOSIS — R5383 Other fatigue: Secondary | ICD-10-CM | POA: Diagnosis not present

## 2020-04-09 DIAGNOSIS — Z5111 Encounter for antineoplastic chemotherapy: Secondary | ICD-10-CM | POA: Diagnosis not present

## 2020-04-09 LAB — CMP (CANCER CENTER ONLY)
ALT: 22 U/L (ref 0–44)
AST: 27 U/L (ref 15–41)
Albumin: 4.5 g/dL (ref 3.5–5.0)
Alkaline Phosphatase: 60 U/L (ref 38–126)
Anion gap: 13 (ref 5–15)
BUN: 14 mg/dL (ref 6–20)
CO2: 24 mmol/L (ref 22–32)
Calcium: 9.3 mg/dL (ref 8.9–10.3)
Chloride: 104 mmol/L (ref 98–111)
Creatinine: 0.71 mg/dL (ref 0.44–1.00)
GFR, Estimated: >60 mL/min (ref >60)
Glucose, Bld: 91 mg/dL (ref 70–99)
Potassium: 4 mmol/L (ref 3.5–5.1)
Sodium: 141 mmol/L (ref 135–145)
Total Bilirubin: 0.4 mg/dL (ref 0.3–1.2)
Total Protein: 7.9 g/dL (ref 6.5–8.1)

## 2020-04-09 LAB — CBC WITH DIFFERENTIAL (CANCER CENTER ONLY)
Abs Immature Granulocytes: 0.03 10*3/uL (ref 0.00–0.07)
Basophils Absolute: 0.1 10*3/uL (ref 0.0–0.1)
Basophils Relative: 1 %
Eosinophils Absolute: 0 10*3/uL (ref 0.0–0.5)
Eosinophils Relative: 0 %
HCT: 32.7 % — ABNORMAL LOW (ref 36.0–46.0)
Hemoglobin: 10.9 g/dL — ABNORMAL LOW (ref 12.0–15.0)
Immature Granulocytes: 0 %
Lymphocytes Relative: 17 %
Lymphs Abs: 1.1 10*3/uL (ref 0.7–4.0)
MCH: 30.4 pg (ref 26.0–34.0)
MCHC: 33.3 g/dL (ref 30.0–36.0)
MCV: 91.3 fL (ref 80.0–100.0)
Monocytes Absolute: 0.9 10*3/uL (ref 0.1–1.0)
Monocytes Relative: 13 %
Neutro Abs: 4.7 10*3/uL (ref 1.7–7.7)
Neutrophils Relative %: 69 %
Platelet Count: 354 10*3/uL (ref 150–400)
RBC: 3.58 MIL/uL — ABNORMAL LOW (ref 3.87–5.11)
RDW: 16.3 % — ABNORMAL HIGH (ref 11.5–15.5)
WBC Count: 6.8 10*3/uL (ref 4.0–10.5)
nRBC: 0 % (ref 0.0–0.2)

## 2020-04-09 LAB — PREGNANCY, URINE: Preg Test, Ur: NEGATIVE

## 2020-04-09 LAB — TSH: TSH: 0.694 u[IU]/mL (ref 0.308–3.960)

## 2020-04-09 MED ORDER — SODIUM CHLORIDE 0.9 % IV SOLN
10.0000 mg | Freq: Once | INTRAVENOUS | Status: AC
  Administered 2020-04-09: 10 mg via INTRAVENOUS
  Filled 2020-04-09: qty 10

## 2020-04-09 MED ORDER — DOXORUBICIN HCL CHEMO IV INJECTION 2 MG/ML
60.0000 mg/m2 | Freq: Once | INTRAVENOUS | Status: AC
  Administered 2020-04-09: 94 mg via INTRAVENOUS
  Filled 2020-04-09: qty 47

## 2020-04-09 MED ORDER — SODIUM CHLORIDE 0.9 % IV SOLN
600.0000 mg/m2 | Freq: Once | INTRAVENOUS | Status: AC
  Administered 2020-04-09: 940 mg via INTRAVENOUS
  Filled 2020-04-09: qty 47

## 2020-04-09 MED ORDER — SODIUM CHLORIDE 0.9% FLUSH
10.0000 mL | INTRAVENOUS | Status: DC | PRN
  Administered 2020-04-09: 10 mL
  Filled 2020-04-09: qty 10

## 2020-04-09 MED ORDER — SODIUM CHLORIDE 0.9 % IV SOLN
200.0000 mg | Freq: Once | INTRAVENOUS | Status: AC
  Administered 2020-04-09: 200 mg via INTRAVENOUS
  Filled 2020-04-09: qty 8

## 2020-04-09 MED ORDER — SODIUM CHLORIDE 0.9% FLUSH
10.0000 mL | Freq: Once | INTRAVENOUS | Status: AC
  Administered 2020-04-09: 10 mL
  Filled 2020-04-09: qty 10

## 2020-04-09 MED ORDER — HEPARIN SOD (PORK) LOCK FLUSH 100 UNIT/ML IV SOLN
500.0000 [IU] | Freq: Once | INTRAVENOUS | Status: AC | PRN
  Administered 2020-04-09: 500 [IU]
  Filled 2020-04-09: qty 5

## 2020-04-09 MED ORDER — SODIUM CHLORIDE 0.9 % IV SOLN
150.0000 mg | Freq: Once | INTRAVENOUS | Status: AC
  Administered 2020-04-09: 150 mg via INTRAVENOUS
  Filled 2020-04-09: qty 150

## 2020-04-09 MED ORDER — PALONOSETRON HCL INJECTION 0.25 MG/5ML
0.2500 mg | Freq: Once | INTRAVENOUS | Status: AC
  Administered 2020-04-09: 0.25 mg via INTRAVENOUS

## 2020-04-09 MED ORDER — SODIUM CHLORIDE 0.9 % IV SOLN
Freq: Once | INTRAVENOUS | Status: AC
  Filled 2020-04-09: qty 250

## 2020-04-09 MED ORDER — PALONOSETRON HCL INJECTION 0.25 MG/5ML
INTRAVENOUS | Status: AC
  Filled 2020-04-09: qty 5

## 2020-04-09 NOTE — Assessment & Plan Note (Addendum)
01/28/2020: Palpable right breast lump: Mammogram and ultrasound revealed calcifications spanning 2 cm, biopsy revealed IDC with DCIS, grade 2, ER 5% weak, PR 0%, HER2 equivocal by IHC, FISH negative ratio 1.67 Ki-67 10%   BRCA2 positive: Risk discussion regarding future breast cancer, and ovarian cancer risk, discussed risk reducing bilateral mastectomies and RRSO.    Treatment plan: 1. Neo- adjuvant chemotherapy with dose dense Adriamycin and Cytoxan/Keytruda followed by Taxol and carboplatin 2. bilateral mastectomies with sentinel lymph node biopsy 3. Adjuvant radiation 4. followed by adjuvant antiestrogen therapy 5.  Genetic testing _______________________________________________________________________ Current Treatment: Neoadjuvant chemotherapy with Adriamycin/Cytoxan/Keytruda, cycle 4 day 1 Echocardiogram 01/31/2020 EF 60-65%  Chemo toxicities: She is tolerating treatment well For her constipation I recommended miralax daily  We discussed her next chemotherapy regimen that includes Taxol/Carboplatin/Keytruda.  She understands the risk for peripheral neuropathy, and will bring bags for cryotherapy.  We talked about activity to help with the fatigue.    She will return on 04/28/2020 for her next treatment.  She knows to call for any questions that may arise between now and her next appointment.  We are happy to see her sooner if needed.

## 2020-04-09 NOTE — Patient Instructions (Signed)
Implanted Port Insertion, Care After This sheet gives you information about how to care for yourself after your procedure. Your health care provider may also give you more specific instructions. If you have problems or questions, contact your health care provider. What can I expect after the procedure? After the procedure, it is common to have:  Discomfort at the port insertion site.  Bruising on the skin over the port. This should improve over 3-4 days. Follow these instructions at home: Port care  After your port is placed, you will get a manufacturer's information card. The card has information about your port. Keep this card with you at all times.  Take care of the port as told by your health care provider. Ask your health care provider if you or a family member can get training for taking care of the port at home. A home health care nurse may also take care of the port.  Make sure to remember what type of port you have. Incision care  Follow instructions from your health care provider about how to take care of your port insertion site. Make sure you: ? Wash your hands with soap and water before and after you change your bandage (dressing). If soap and water are not available, use hand sanitizer. ? Change your dressing as told by your health care provider. ? Leave stitches (sutures), skin glue, or adhesive strips in place. These skin closures may need to stay in place for 2 weeks or longer. If adhesive strip edges start to loosen and curl up, you may trim the loose edges. Do not remove adhesive strips completely unless your health care provider tells you to do that.  Check your port insertion site every day for signs of infection. Check for: ? Redness, swelling, or pain. ? Fluid or blood. ? Warmth. ? Pus or a bad smell.      Activity  Return to your normal activities as told by your health care provider. Ask your health care provider what activities are safe for you.  Do not  lift anything that is heavier than 10 lb (4.5 kg), or the limit that you are told, until your health care provider says that it is safe. General instructions  Take over-the-counter and prescription medicines only as told by your health care provider.  Do not take baths, swim, or use a hot tub until your health care provider approves. Ask your health care provider if you may take showers. You may only be allowed to take sponge baths.  Do not drive for 24 hours if you were given a sedative during your procedure.  Wear a medical alert bracelet in case of an emergency. This will tell any health care providers that you have a port.  Keep all follow-up visits as told by your health care provider. This is important. Contact a health care provider if:  You cannot flush your port with saline as directed, or you cannot draw blood from the port.  You have a fever or chills.  You have redness, swelling, or pain around your port insertion site.  You have fluid or blood coming from your port insertion site.  Your port insertion site feels warm to the touch.  You have pus or a bad smell coming from the port insertion site. Get help right away if:  You have chest pain or shortness of breath.  You have bleeding from your port that you cannot control. Summary  Take care of the port as told by your   health care provider. Keep the manufacturer's information card with you at all times.  Change your dressing as told by your health care provider.  Contact a health care provider if you have a fever or chills or if you have redness, swelling, or pain around your port insertion site.  Keep all follow-up visits as told by your health care provider. This information is not intended to replace advice given to you by your health care provider. Make sure you discuss any questions you have with your health care provider. Document Revised: 07/25/2017 Document Reviewed: 07/25/2017 Elsevier Patient Education   2021 Elsevier Inc.  

## 2020-04-09 NOTE — Patient Instructions (Signed)
[place] Discharge Instructions for Patients Receiving Chemotherapy  Today you received the following chemotherapy agents: pembrolizumab, doxorubicin, and cyclophosphamide.  To help prevent nausea and vomiting after your treatment, we encourage you to take your nausea medication as directed.   If you develop nausea and vomiting that is not controlled by your nausea medication, call the clinic.   BELOW ARE SYMPTOMS THAT SHOULD BE REPORTED IMMEDIATELY:  *FEVER GREATER THAN 100.5 F  *CHILLS WITH OR WITHOUT FEVER  NAUSEA AND VOMITING THAT IS NOT CONTROLLED WITH YOUR NAUSEA MEDICATION  *UNUSUAL SHORTNESS OF BREATH  *UNUSUAL BRUISING OR BLEEDING  TENDERNESS IN MOUTH AND THROAT WITH OR WITHOUT PRESENCE OF ULCERS  *URINARY PROBLEMS  *BOWEL PROBLEMS  UNUSUAL RASH Items with * indicate a potential emergency and should be followed up as soon as possible.  Feel free to call the clinic should you have any questions or concerns. The clinic phone number is [phone].  Please show the [place] at check-in to the Emergency Department and triage nurse.

## 2020-04-09 NOTE — Progress Notes (Signed)
[facility] Follow up:    Sydney Reeve, DO [address] [place] [address]   DIAGNOSIS: Cancer Staging Malignant neoplasm of upper outer quadrant of female breast ([place]) Staging form: Breast, AJCC 8th Edition - Clinical stage from 01/28/2020: Stage IA (cT1c, cN0, cM0, G2, ER+, PR-, HER2: Equivocal) - Signed by Nicholas Lose, MD on 01/29/2020 Stage prefix: Initial diagnosis Method of lymph node assessment: Clinical   SUMMARY OF ONCOLOGIC HISTORY: Oncology History  Malignant neoplasm of upper outer quadrant of female breast ([place])  01/28/2020 Initial Diagnosis   Patient palpated a right breast lump. Diagnostic mammogram and US showed calcifications spanning 2.0cm in the right breast. Biopsy showed invasive and in situ carcinoma, grade 2. 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 Testing was completed through the CustomNext-Cancer + RNAinsight panel offered by [facility]. 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 The report date is 02/13/2020.  The CustomNext-Cancer+RNAinsight panel offered by [facility] includes sequencing and rearrangement analysis for the following 47 genes:  APC, ATM, AXIN2, BARD1, BMPR1A, BRCA1, BRCA2, BRIP1, CDH1, CDK4, CDKN2A, CHEK2, DICER1, EPCAM, GREM1, HOXB13, MEN1, MLH1, MSH2, MSH3, MSH6, MUTYH, NBN, NF1, NF2, NTHL1, PALB2, PMS2, POLD1, POLE, PTEN,  RAD51C, RAD51D, RECQL, RET, SDHA, SDHAF2, SDHB, SDHC, SDHD, SMAD4, SMARCA4, STK11, TP53, TSC1, TSC2, and VHL.  RNA data is routinely analyzed for use in variant interpretation for all genes.     CURRENT THERAPY: neoadjuvant chemotherapy  INTERVAL HISTORY: Sydney Herman 40 y.o. female returns for evaluation prior to her fourth cycle of adriamycin/cytoxan/keytruda.  She is toelrating this well.  She is fatigued.  She notes she has some constipation today through Monday and wants to know if she can take miralax for this.  She also has some questions about Taxol that she starts in a few weeks.    Patient Active Problem List   Diagnosis Date Noted  . Port-A-Cath in place 03/19/2020  . BRCA2 gene mutation positive 02/21/2020  . Genetic testing 02/13/2020  . Family history of prostate cancer   . Family history of uterine cancer   . Family history of esophageal cancer   . Malignant neoplasm of upper outer quadrant of female breast ([place]) 01/28/2020  . History of abnormal cervical Pap smear 11/16/2018  . Family history of type 2 diabetes mellitus in father 11/16/2018  . Family history of breast cancer in mother 11/16/2018  . Mild hyperlipidemia 11/02/2017  . Vitamin B12 deficiency 02/20/2017  . Generalized headaches     is allergic to amoxil [amoxicillin].  MEDICAL HISTORY: Past Medical History:  Diagnosis Date  . 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 Georgette Dover 2015- was thought to be scar tissue- no growth since that time   . Breast cancer ([place]) 01/23/2020   Right  . Chicken pox   . Family history of esophageal cancer   .  Family history of prostate cancer   . Family history of uterine cancer   . Generalized headaches    Has had some migraines in past as well. once a week or less. Usually occipital and associated with sinus pressure as well.   Marland Kitchen Heartburn in pregnancy     SURGICAL HISTORY: Past Surgical History:  Procedure Laterality Date  . CESAREAN SECTION   07/2010   [facility]  . CESAREAN SECTION N/A 12/27/2012   womens 2nd   . PORTACATH PLACEMENT Right 02/04/2020   Procedure: INSERTION PORT-A-CATH WITH ULTRASOUND;  Surgeon: Rolm Bookbinder, MD;  Location: [facility];  Service: General;  Laterality: Right;  . WISDOM TOOTH EXTRACTION      SOCIAL HISTORY: Social History   Socioeconomic History  . Marital status: Married    Spouse name: Not on file  . Number of children: 2  . Years of education: Not on file  . Highest education level: Not on file  Occupational History  . Not on file  Tobacco Use  . Smoking status: Never Smoker  . Smokeless tobacco: Never Used  Vaping Use  . Vaping Use: Never used  Substance and Sexual Activity  . Alcohol use: Not Currently    Alcohol/week: 2.0 - 3.0 standard drinks    Types: 2 - 3 Standard drinks or equivalent per week  . Drug use: Never  . Sexual activity: Yes    Partners: Male    Birth control/protection: Pill, Condom  Other Topics Concern  . 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 Difficulty of Paying Living Expenses: Not hard at all  Food Insecurity: No Food Insecurity  . Worried About Charity fundraiser in the Last Year: Never true  . Ran Out of Food in the Last Year: Never true  Transportation Needs: No Transportation Needs  . Lack of Transportation (Medical): No  . Lack of Transportation (Non-Medical): No  Physical Activity: Not on file  Stress: Not on file  Social Connections: Not on file  Intimate Partner Violence: Not on file     FAMILY HISTORY: Family History  Problem Relation Age of Onset  . Hypertension Mother   . Breast cancer Mother 39       early 81's  . Diabetes Father   . Atrial fibrillation Father   . Other Father        biopsies on kidney- noncancerous. stated potentially precancerous  . Prostate cancer Maternal Grandfather 90  . Endometrial cancer Paternal Grandmother 89  . Esophageal cancer Paternal Uncle        dx early 77s    Review of Systems  Constitutional: Positive for fatigue. Negative for appetite change, chills, fever and unexpected weight change.  HENT:   Negative for hearing loss, lump/mass and trouble swallowing.   Eyes: Negative for eye problems and icterus.  Respiratory: Negative for chest tightness, cough and shortness of breath.   Cardiovascular: Negative for chest pain, leg swelling and palpitations.  Gastrointestinal: Positive for constipation. Negative for abdominal distention, abdominal pain, diarrhea, nausea and vomiting.  Endocrine: Negative for hot flashes.  Genitourinary: Negative for difficulty urinating.   Musculoskeletal: Negative for arthralgias.  Skin: Negative for itching and rash.  Neurological: Negative for dizziness, extremity weakness, headaches and numbness.  Hematological: Negative for adenopathy. Does not bruise/bleed easily.  Psychiatric/Behavioral: Negative for depression. The patient is not nervous/anxious.       PHYSICAL EXAMINATION  ECOG PERFORMANCE STATUS: 1 - Symptomatic but completely ambulatory  Vitals:   04/09/20 0900  BP: 107/73  Pulse: 79  Resp: 20  Temp: 98.1 F (36.7 C)  SpO2: 100%    Physical Exam Constitutional:      General: She is not in acute distress.    Appearance: Normal appearance. She is not toxic-appearing.  HENT:     Head: Normocephalic and atraumatic.  Eyes:     General: No scleral icterus. Cardiovascular:     Rate and Rhythm: Normal rate and regular rhythm.     Pulses: Normal pulses.     Heart sounds:  Normal heart sounds.  Pulmonary:     Effort: Pulmonary effort is normal.     Breath sounds: Normal breath sounds.  Abdominal:     General: Abdomen is flat. Bowel sounds are normal. There is no distension.     Palpations: Abdomen is soft.     Tenderness: There is no abdominal tenderness.  Musculoskeletal:        General: No swelling.     Cervical back: Neck supple.  Lymphadenopathy:     Cervical: No cervical adenopathy.  Skin:    General: Skin is warm and dry.     Findings: No rash.  Neurological:     General: No focal deficit present.     Mental Status: She is alert.  Psychiatric:        Mood and Affect: Mood normal.        Behavior: Behavior normal.     LABORATORY DATA:  CBC    Component Value Date/Time   WBC 6.8 04/09/2020 0843   WBC 8.3 11/11/2019 0845   RBC 3.58 (L) 04/09/2020 0843   HGB 10.9 (L) 04/09/2020 0843   HCT 32.7 (L) 04/09/2020 0843   PLT 354 04/09/2020 0843   MCV 91.3 04/09/2020 0843   MCH 30.4 04/09/2020 0843   MCHC 33.3 04/09/2020 0843   RDW 16.3 (H) 04/09/2020 0843   LYMPHSABS 1.1 04/09/2020 0843   MONOABS 0.9 04/09/2020 0843   EOSABS 0.0 04/09/2020 0843   BASOSABS 0.1 04/09/2020 0843    CMP     Component Value Date/Time   NA 141 04/09/2020 0843   K 4.0 04/09/2020 0843   CL 104 04/09/2020 0843   CO2 24 04/09/2020 0843   GLUCOSE 91 04/09/2020 0843   BUN 14 04/09/2020 0843   CREATININE 0.71 04/09/2020 0843   CREATININE 0.79 11/18/2019 1119   CALCIUM 9.3 04/09/2020 0843   PROT 7.9 04/09/2020 0843   ALBUMIN 4.5 04/09/2020 0843   AST 27 04/09/2020 0843   ALT 22 04/09/2020 0843   ALKPHOS 60 04/09/2020 0843   BILITOT 0.4 04/09/2020 0843   GFRNONAA >60 04/09/2020 0843   GFRNONAA 95 11/18/2019 1119   GFRAA 110 11/18/2019 1119          ASSESSMENT and THERAPY PLAN:   Malignant neoplasm of upper outer quadrant of female breast ([place]) 01/28/2020: Palpable right breast lump: Mammogram and ultrasound revealed calcifications spanning 2 cm,  biopsy revealed IDC with  DCIS, grade 2, ER 5% weak, PR 0%, HER2 equivocal by IHC, FISH negative ratio 1.67 Ki-67 10%   BRCA2 positive: Risk discussion regarding future breast cancer, and ovarian cancer risk, discussed risk reducing bilateral mastectomies and RRSO.    Treatment plan: 1. Neo- adjuvant chemotherapy with dose dense Adriamycin and Cytoxan/Keytruda followed by Taxol and carboplatin 2. bilateral mastectomies with sentinel lymph node biopsy 3. Adjuvant radiation 4. followed by adjuvant antiestrogen therapy 5.  Genetic testing _______________________________________________________________________ Current Treatment: Neoadjuvant chemotherapy with Adriamycin/Cytoxan/Keytruda, cycle 4 day 1 Echocardiogram 01/31/2020 EF 60-65%  Chemo toxicities: She is tolerating treatment well For her constipation I recommended miralax daily  We discussed her next chemotherapy regimen that includes Taxol/Carboplatin/Keytruda.  She understands the risk for peripheral neuropathy, and will bring bags for cryotherapy.  We talked about activity to help with the fatigue.    She will return on 04/28/2020 for her next treatment.  She knows to call for any questions that may arise between now and her next appointment.  We are happy to see her sooner if needed.    All questions were answered. The patient knows to call the clinic with any problems, questions or concerns. We can certainly see the patient much sooner if necessary.  Total encounter time: 20 minutes*  Wilber Bihari, NP 04/09/20 9:26 AM Medical Oncology and Hematology [facility] [place], [place] [address] Tel. [phone]    Fax. [phone]  *Total Encounter Time as defined by the Centers for Medicare and Medicaid Services includes, in addition to the face-to-face time of a patient visit (documented in the note above) non-face-to-face time: obtaining and reviewing outside history, ordering and reviewing  medications, tests or procedures, care coordination (communications with other health care professionals or caregivers) and documentation in the medical record.

## 2020-04-10 ENCOUNTER — Telehealth: Payer: Self-pay | Admitting: Adult Health

## 2020-04-10 NOTE — Telephone Encounter (Signed)
Scheduled per 3/31 los. Pt will receive an updated appt calendar per next visit appt notes

## 2020-04-11 ENCOUNTER — Inpatient Hospital Stay: Payer: 59 | Attending: Adult Health

## 2020-04-11 ENCOUNTER — Other Ambulatory Visit: Payer: Self-pay

## 2020-04-11 VITALS — BP 117/76 | HR 70 | Temp 97.6°F | Resp 17

## 2020-04-11 DIAGNOSIS — C50411 Malignant neoplasm of upper-outer quadrant of right female breast: Secondary | ICD-10-CM | POA: Diagnosis not present

## 2020-04-11 DIAGNOSIS — Z8042 Family history of malignant neoplasm of prostate: Secondary | ICD-10-CM | POA: Diagnosis not present

## 2020-04-11 DIAGNOSIS — Z803 Family history of malignant neoplasm of breast: Secondary | ICD-10-CM | POA: Diagnosis not present

## 2020-04-11 DIAGNOSIS — Z800 Family history of malignant neoplasm of digestive organs: Secondary | ICD-10-CM | POA: Insufficient documentation

## 2020-04-11 DIAGNOSIS — E538 Deficiency of other specified B group vitamins: Secondary | ICD-10-CM | POA: Insufficient documentation

## 2020-04-11 DIAGNOSIS — Z833 Family history of diabetes mellitus: Secondary | ICD-10-CM | POA: Diagnosis not present

## 2020-04-11 DIAGNOSIS — Z79899 Other long term (current) drug therapy: Secondary | ICD-10-CM | POA: Insufficient documentation

## 2020-04-11 DIAGNOSIS — Z5189 Encounter for other specified aftercare: Secondary | ICD-10-CM | POA: Diagnosis not present

## 2020-04-11 DIAGNOSIS — Z8049 Family history of malignant neoplasm of other genital organs: Secondary | ICD-10-CM | POA: Insufficient documentation

## 2020-04-11 DIAGNOSIS — Z5112 Encounter for antineoplastic immunotherapy: Secondary | ICD-10-CM | POA: Diagnosis present

## 2020-04-11 DIAGNOSIS — Z170 Estrogen receptor positive status [ER+]: Secondary | ICD-10-CM | POA: Insufficient documentation

## 2020-04-11 MED ORDER — PEGFILGRASTIM-BMEZ 6 MG/0.6ML ~~LOC~~ SOSY
6.0000 mg | PREFILLED_SYRINGE | Freq: Once | SUBCUTANEOUS | Status: AC
Start: 2020-04-11 — End: 2020-04-11
  Administered 2020-04-11: 6 mg via SUBCUTANEOUS

## 2020-04-11 NOTE — Patient Instructions (Signed)
Pegfilgrastim injection What is this medicine? PEGFILGRASTIM (PEG fil gra stim) is a long-acting granulocyte colony-stimulating factor that stimulates the growth of neutrophils, a type of white blood cell important in the body's fight against infection. It is used to reduce the incidence of fever and infection in patients with certain types of cancer who are receiving chemotherapy that affects the bone marrow, and to increase survival after being exposed to high doses of radiation. This medicine may be used for other purposes; ask your health care provider or pharmacist if you have questions. COMMON BRAND NAME(S): Fulphila, Neulasta, Nyvepria, UDENYCA, Ziextenzo What should I tell my health care provider before I take this medicine? They need to know if you have any of these conditions:  kidney disease  latex allergy  ongoing radiation therapy  sickle cell disease  skin reactions to acrylic adhesives (On-Body Injector only)  an unusual or allergic reaction to pegfilgrastim, filgrastim, other medicines, foods, dyes, or preservatives  pregnant or trying to get pregnant  breast-feeding How should I use this medicine? This medicine is for injection under the skin. If you get this medicine at home, you will be taught how to prepare and give the pre-filled syringe or how to use the On-body Injector. Refer to the patient Instructions for Use for detailed instructions. Use exactly as directed. Tell your healthcare provider immediately if you suspect that the On-body Injector may not have performed as intended or if you suspect the use of the On-body Injector resulted in a missed or partial dose. It is important that you put your used needles and syringes in a special sharps container. Do not put them in a trash can. If you do not have a sharps container, call your pharmacist or healthcare provider to get one. Talk to your pediatrician regarding the use of this medicine in children. While this drug  may be prescribed for selected conditions, precautions do apply. Overdosage: If you think you have taken too much of this medicine contact a poison control center or emergency room at once. NOTE: This medicine is only for you. Do not share this medicine with others. What if I miss a dose? It is important not to miss your dose. Call your doctor or health care professional if you miss your dose. If you miss a dose due to an On-body Injector failure or leakage, a new dose should be administered as soon as possible using a single prefilled syringe for manual use. What may interact with this medicine? Interactions have not been studied. This list may not describe all possible interactions. Give your health care provider a list of all the medicines, herbs, non-prescription drugs, or dietary supplements you use. Also tell them if you smoke, drink alcohol, or use illegal drugs. Some items may interact with your medicine. What should I watch for while using this medicine? Your condition will be monitored carefully while you are receiving this medicine. You may need blood work done while you are taking this medicine. Talk to your health care provider about your risk of cancer. You may be more at risk for certain types of cancer if you take this medicine. If you are going to need a MRI, CT scan, or other procedure, tell your doctor that you are using this medicine (On-Body Injector only). What side effects may I notice from receiving this medicine? Side effects that you should report to your doctor or health care professional as soon as possible:  allergic reactions (skin rash, itching or hives, swelling of   the face, lips, or tongue)  back pain  dizziness  fever  pain, redness, or irritation at site where injected  pinpoint red spots on the skin  red or dark-brown urine  shortness of breath or breathing problems  stomach or side pain, or pain at the shoulder  swelling  tiredness  trouble  passing urine or change in the amount of urine  unusual bruising or bleeding Side effects that usually do not require medical attention (report to your doctor or health care professional if they continue or are bothersome):  bone pain  muscle pain This list may not describe all possible side effects. Call your doctor for medical advice about side effects. You may report side effects to FDA at 1-800-FDA-1088. Where should I keep my medicine? Keep out of the reach of children. If you are using this medicine at home, you will be instructed on how to store it. Throw away any unused medicine after the expiration date on the label. NOTE: This sheet is a summary. It may not cover all possible information. If you have questions about this medicine, talk to your doctor, pharmacist, or health care provider.  2021 Elsevier/Gold Standard (2019-01-18 13:20:51)  

## 2020-04-21 ENCOUNTER — Other Ambulatory Visit: Payer: Self-pay | Admitting: *Deleted

## 2020-04-21 ENCOUNTER — Telehealth: Payer: Self-pay | Admitting: *Deleted

## 2020-04-21 DIAGNOSIS — C50411 Malignant neoplasm of upper-outer quadrant of right female breast: Secondary | ICD-10-CM

## 2020-04-21 DIAGNOSIS — Z170 Estrogen receptor positive status [ER+]: Secondary | ICD-10-CM

## 2020-04-21 MED ORDER — ESCITALOPRAM OXALATE 5 MG PO TABS
10.0000 mg | ORAL_TABLET | Freq: Every day | ORAL | 3 refills | Status: DC
Start: 2020-04-21 — End: 2020-05-20

## 2020-04-21 NOTE — Progress Notes (Signed)
Pt called with report of chills and body aches, no fever.  Per MD pt needing to preform home Covid test.  If negative, pt to come to the clinic tomorrow for labs (with blood cultures) and MD visit.  Pt notified and verbalized understanding.

## 2020-04-21 NOTE — Progress Notes (Incomplete)
Patient Care Team: Emeterio Reeve, DO as PCP - General (Osteopathic Medicine) [name], MD as Consulting Physician (Obstetrics and Gynecology) Mauro Kaufmann, RN as Oncology Nurse Navigator Rockwell Germany, RN as Oncology Nurse Navigator  DIAGNOSIS: No diagnosis found.  SUMMARY OF ONCOLOGIC HISTORY: Oncology History  Malignant neoplasm of upper outer quadrant of female breast ([place])  01/28/2020 Initial Diagnosis   Patient palpated a right breast lump. Diagnostic mammogram and US showed calcifications spanning 2.0cm in the right breast. Biopsy showed invasive and in situ carcinoma, grade 2. 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 Testing was completed through the CustomNext-Cancer + RNAinsight panel offered by [facility]. 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 The report date is 02/13/2020.  The CustomNext-Cancer+RNAinsight panel offered by [facility] includes sequencing and rearrangement analysis for the following 47 genes:  APC, ATM, AXIN2, BARD1, BMPR1A, BRCA1, BRCA2, BRIP1, CDH1, CDK4, CDKN2A, CHEK2, DICER1, EPCAM, GREM1, HOXB13, MEN1, MLH1, MSH2, MSH3, MSH6, MUTYH, NBN, NF1, NF2, NTHL1, PALB2, PMS2, POLD1, POLE, PTEN, RAD51C, RAD51D, RECQL, RET, SDHA, SDHAF2, SDHB, SDHC, SDHD, SMAD4, SMARCA4, STK11, TP53, TSC1, TSC2, and VHL.  RNA data is routinely analyzed for use in variant interpretation for  all genes.     CHIEF COMPLIANT: Cycle 4 Day 14 Adriamycin/Cytoxan/Pembrolizumab  INTERVAL HISTORY: Sydney Herman is a 40 y.o. with above-mentioned history of invasive ductal carcinoma of the right breast currently on neoadjuvant chemotherapy with Adriamycin/Cytoxan/Pembrolizumab. She presents to the clinic todayfor follow-up.   ALLERGIES:  is allergic to amoxil [amoxicillin].  MEDICATIONS:  Current Outpatient Medications  Medication Sig Dispense Refill  . cycloSPORINE (RESTASIS) 0.05 % ophthalmic emulsion INSILL 1 DROP INTO BOTH EYES TWICE DAILY 180 mL 4  . doxycycline (VIBRA-TABS) 100 MG tablet TAKE 1 TABLET BY MOUTH 2 TIMES DAILY FOR 7 DAYS 14 tablet 0  . escitalopram (LEXAPRO) 5 MG tablet Take 2 tablets (10 mg total) by mouth daily. 90 tablet 3  . lidocaine-prilocaine (EMLA) cream APPLY TO AFFECTED AREA ONCE 30 g 3  . LORazepam (ATIVAN) 0.5 MG tablet TAKE 1 TABLET BY MOUTH AT BEDTIME AS NEEDED FOR NAUSEA OR VOMITING 30 tablet 0  . ondansetron (ZOFRAN) 8 MG tablet TAKE 1 TABLET BY MOUTH 2 TIMES DAILY AS NEEDED START ON THE THIRD DAY AFTER CHEMO 30 tablet 1  . prochlorperazine (COMPAZINE) 10 MG tablet TAKE 1 TABLET BY MOUTH EVERY 6 HOURS AS NEEDED FOR NAUSEA OR VOMITING 30 tablet 1  . RESTASIS 0.05 % ophthalmic emulsion Place 1 drop into the right eye 2 (two) times daily.     No current facility-administered medications for this visit.    PHYSICAL EXAMINATION: ECOG PERFORMANCE STATUS: {CHL ONC ECOG PS:[phone]}  There were no vitals filed for this visit. There were no vitals filed for this visit.  LABORATORY DATA:  I have reviewed the data as listed CMP Latest Ref Rng & Units 04/09/2020 03/19/2020  02/27/2020  Glucose 70 - 99 mg/dL 91 91 86  BUN 6 - 20 mg/dL '14 11 10  ' Creatinine 0.44 - 1.00 mg/dL 0.71 0.77 0.73  Sodium 135 - 145 mmol/L 141 140 136  Potassium 3.5 - 5.1 mmol/L 4.0 3.9 3.9  Chloride 98 - 111 mmol/L 104 106 103  CO2 22 - 32 mmol/L '24 23 26  ' Calcium 8.9 - 10.3  mg/dL 9.3 9.2 9.2  Total Protein 6.5 - 8.1 g/dL 7.9 7.7 7.7  Total Bilirubin 0.3 - 1.2 mg/dL 0.4 0.3 0.4  Alkaline Phos 38 - 126 U/L 60 68 58  AST 15 - 41 U/L 27 31 32  ALT 0 - 44 U/L '22 28 26    ' Lab Results  Component Value Date   WBC 6.8 04/09/2020   HGB 10.9 (L) 04/09/2020   HCT 32.7 (L) 04/09/2020   MCV 91.3 04/09/2020   PLT 354 04/09/2020   NEUTROABS 4.7 04/09/2020    ASSESSMENT & PLAN:  No problem-specific Assessment & Plan notes found for this encounter.    No orders of the defined types were placed in this encounter.  The patient has a good understanding of the overall plan. she agrees with it. she will call with any problems that may develop before the next visit here.  Total time spent: *** mins including face to face time and time spent for planning, charting and coordination of care  Rulon Eisenmenger, MD, MPH 04/21/2020  I, Molly Dorshimer, am acting as scribe for Dr. Nicholas Lose.  {insert scribe attestation}

## 2020-04-21 NOTE — Telephone Encounter (Signed)
Pt called c/o chills x1 hr and headache that started yesterday evening with some body aches. Afebrile, no N/V.  Per Dr. Lindi Adie pt to take covid test. If negative to come in to clinic at 92. Confirmed appt with pt.

## 2020-04-22 ENCOUNTER — Inpatient Hospital Stay: Payer: 59

## 2020-04-22 ENCOUNTER — Encounter: Payer: Self-pay | Admitting: *Deleted

## 2020-04-22 ENCOUNTER — Ambulatory Visit: Payer: 59 | Admitting: Hematology and Oncology

## 2020-04-22 ENCOUNTER — Ambulatory Visit (HOSPITAL_COMMUNITY)
Admission: RE | Admit: 2020-04-22 | Discharge: 2020-04-22 | Disposition: A | Discharge status: Home / Self Care | Payer: 59 | Source: Ambulatory Visit | Attending: Pulmonary Disease | Admitting: Pulmonary Disease

## 2020-04-22 ENCOUNTER — Other Ambulatory Visit: Payer: 59

## 2020-04-22 ENCOUNTER — Other Ambulatory Visit: Payer: Self-pay | Admitting: Adult Health

## 2020-04-22 ENCOUNTER — Encounter: Payer: Self-pay | Admitting: Adult Health

## 2020-04-22 ENCOUNTER — Telehealth: Payer: Self-pay | Admitting: Hematology and Oncology

## 2020-04-22 ENCOUNTER — Inpatient Hospital Stay: Payer: 59 | Admitting: Hematology and Oncology

## 2020-04-22 DIAGNOSIS — U071 COVID-19: Secondary | ICD-10-CM | POA: Diagnosis not present

## 2020-04-22 MED ORDER — METHYLPREDNISOLONE SODIUM SUCC 125 MG IJ SOLR: 125.0000 mg | Freq: Once | INTRAMUSCULAR | Status: DC | PRN

## 2020-04-22 MED ORDER — FAMOTIDINE IN NACL 20-0.9 MG/50ML-% IV SOLN: 20.0000 mg | Freq: Once | INTRAVENOUS | Status: DC | PRN

## 2020-04-22 MED ORDER — BEBTELOVIMAB 175 MG/2 ML IV (EUA)
175.0000 mg | Freq: Once | INTRAMUSCULAR | Status: AC
Start: 2020-04-22 — End: 2020-04-22
  Administered 2020-04-22: 175 mg via INTRAVENOUS

## 2020-04-22 MED ORDER — ALBUTEROL SULFATE HFA 108 (90 BASE) MCG/ACT IN AERS: 2.0000 | INHALATION_SPRAY | Freq: Once | RESPIRATORY_TRACT | Status: DC | PRN

## 2020-04-22 MED ORDER — SODIUM CHLORIDE 0.9 % IV SOLN: INTRAVENOUS | Status: DC | PRN

## 2020-04-22 MED ORDER — DIPHENHYDRAMINE HCL 50 MG/ML IJ SOLN: 50.0000 mg | Freq: Once | INTRAMUSCULAR | Status: DC | PRN

## 2020-04-22 MED ORDER — EPINEPHRINE 0.3 MG/0.3ML IJ SOAJ: 0.3000 mg | Freq: Once | INTRAMUSCULAR | Status: DC | PRN

## 2020-04-22 NOTE — Progress Notes (Signed)
Pt tested positive for Covid 19 on 04/21/20.  Per MD pt to receive Covid Monoclonial Antibody infusion.  RN placed message to Wilber Bihari, NP to schedule pt for infusion.  Per MD request, pt needing to hold chemotherapy for 21 days.  High priority message sent to scheduling to reschedule upcoming apts for 04/28/20 to be rescheduled for 06/30/20 and 05/05/20 apt to be rescheduled to 07/07/20.

## 2020-04-22 NOTE — Progress Notes (Signed)
Patient reviewed Fact Sheet for Patients, Parents, and Caregivers for Emergency Use Authorization (EUA) of bebtelovimab for the Treatment of Coronavirus. Patient also reviewed and is agreeable to the estimated cost of treatment. Patient is agreeable to proceed.

## 2020-04-22 NOTE — Progress Notes (Signed)
I connected by phone with Sydney Herman on 04/22/2020 at 9:38 AM to discuss the potential use of a new treatment for mild to moderate COVID-19 viral infection in non-hospitalized patients.  This patient is a 40 y.o. female that meets the FDA criteria for Emergency Use Authorization of COVID monoclonal antibody bebtelovimab.  Has a (+) direct SARS-CoV-2 viral test result  Has mild or moderate COVID-19   Is NOT hospitalized due to COVID-19  Is within 10 days of symptom onset  Has at least one of the high risk factor(s) for progression to severe COVID-19 and/or hospitalization as defined in EUA.  Specific high risk criteria : Immunosuppressive Disease or Treatment   Cost reviewed  Sx onset 04/17/2020   I have spoken and communicated the following to the patient or parent/caregiver regarding COVID monoclonal antibody treatment:  1. FDA has authorized the emergency use for the treatment of mild to moderate COVID-19 in adults and pediatric patients with positive results of direct SARS-CoV-2 viral testing who are 25 years of age and older weighing at least 40 kg, and who are at high risk for progressing to severe COVID-19 and/or hospitalization.  2. The significant known and potential risks and benefits of COVID monoclonal antibody, and the extent to which such potential risks and benefits are unknown.  3. Information on available alternative treatments and the risks and benefits of those alternatives, including clinical trials.  4. Patients treated with COVID monoclonal antibody should continue to self-isolate and use infection control measures (e.g., wear mask, isolate, social distance, avoid sharing personal items, clean and disinfect "high touch" surfaces, and frequent handwashing) according to CDC guidelines.   5. The patient or parent/caregiver has the option to accept or refuse COVID monoclonal antibody treatment.  After reviewing this information with the patient, the patient has  agreed to receive one of the available covid 19 monoclonal antibodies and will be provided an appropriate fact sheet prior to infusion. Scot Dock, NP 04/22/2020 9:38 AM

## 2020-04-22 NOTE — Telephone Encounter (Signed)
R/s appts per 4/13 sch msg. Pt aware.

## 2020-04-22 NOTE — Discharge Instructions (Signed)
10 Things You Can Do to Manage Your COVID-19 Symptoms at Home If you have possible or confirmed COVID-19: 1. Stay home except to get medical care. 2. Monitor your symptoms carefully. If your symptoms get worse, call your healthcare provider immediately. 3. Get rest and stay hydrated. 4. If you have a medical appointment, call the healthcare provider ahead of time and tell them that you have or may have COVID-19. 5. For medical emergencies, call 911 and notify the dispatch personnel that you have or may have COVID-19. 6. Cover your cough and sneezes with a tissue or use the inside of your elbow. 7. Wash your hands often with soap and water for at least 20 seconds or clean your hands with an alcohol-based hand sanitizer that contains at least 60% alcohol. 8. As much as possible, stay in a specific room and away from other people in your home. Also, you should use a separate bathroom, if available. If you need to be around other people in or outside of the home, wear a mask. 9. Avoid sharing personal items with other people in your household, like dishes, towels, and bedding. 10. Clean all surfaces that are touched often, like counters, tabletops, and doorknobs. Use household cleaning sprays or wipes according to the label instructions. cdc.gov/coronavirus 07/26/2019 This information is not intended to replace advice given to you by your health care provider. Make sure you discuss any questions you have with your health care provider. Document Revised: 11/11/2019 Document Reviewed: 11/11/2019 Elsevier Patient Education  2021 Elsevier Inc.  What types of side effects do monoclonal antibody drugs cause?  Common side effects  In general, the more common side effects caused by monoclonal antibody drugs include: . Allergic reactions, such as hives or itching . Flu-like signs and symptoms, including chills, fatigue, fever, and muscle aches and pains . Nausea, vomiting . Diarrhea . Skin  rashes . Low blood pressure   The CDC is recommending patients who receive monoclonal antibody treatments wait at least 90 days before being vaccinated.  Currently, there are no data on the safety and efficacy of mRNA COVID-19 vaccines in persons who received monoclonal antibodies or convalescent plasma as part of COVID-19 treatment. Based on the estimated half-life of such therapies as well as evidence suggesting that reinfection is uncommon in the 90 days after initial infection, vaccination should be deferred for at least 90 days, as a precautionary measure until additional information becomes available, to avoid interference of the antibody treatment with vaccine-induced immune responses.   If someone you know is interested in receiving treatment please have them contact their MD for a referral or visit [email or web address]    

## 2020-04-22 NOTE — Progress Notes (Signed)
Diagnosis: COVID-19  Physician: Dr. Asencion Noble  Procedure: Covid Infusion Clinic Med: bebtelovimab push injection - Provided patient with bebtelovimab fact sheet for patients, parents, and caregivers prior to infusion.   Complications: No immediate complications noted  Discharge: Discharged home

## 2020-04-28 ENCOUNTER — Ambulatory Visit: Payer: 59 | Admitting: Physician Assistant

## 2020-04-28 ENCOUNTER — Other Ambulatory Visit: Payer: 59

## 2020-04-28 ENCOUNTER — Inpatient Hospital Stay: Payer: 59

## 2020-04-28 ENCOUNTER — Other Ambulatory Visit (HOSPITAL_COMMUNITY): Payer: Self-pay

## 2020-04-28 MED FILL — Lorazepam Tab 0.5 MG: ORAL | 30 days supply | Qty: 30 | Fill #0 | Status: AC

## 2020-04-30 ENCOUNTER — Other Ambulatory Visit: Payer: 59

## 2020-04-30 ENCOUNTER — Ambulatory Visit: Payer: 59 | Admitting: Adult Health

## 2020-04-30 ENCOUNTER — Other Ambulatory Visit (HOSPITAL_COMMUNITY): Payer: Self-pay

## 2020-04-30 ENCOUNTER — Ambulatory Visit: Payer: 59 | Admitting: Physician Assistant

## 2020-04-30 ENCOUNTER — Ambulatory Visit: Payer: 59

## 2020-05-04 ENCOUNTER — Encounter: Payer: Self-pay | Admitting: *Deleted

## 2020-05-05 ENCOUNTER — Ambulatory Visit: Payer: 59

## 2020-05-05 ENCOUNTER — Other Ambulatory Visit: Payer: 59

## 2020-05-05 ENCOUNTER — Ambulatory Visit: Payer: 59 | Admitting: Hematology and Oncology

## 2020-05-07 ENCOUNTER — Ambulatory Visit: Payer: 59

## 2020-05-07 ENCOUNTER — Ambulatory Visit: Payer: 59 | Admitting: Adult Health

## 2020-05-07 ENCOUNTER — Other Ambulatory Visit: Payer: 59

## 2020-05-11 MED FILL — Dexamethasone Sodium Phosphate Inj 100 MG/10ML: INTRAMUSCULAR | Qty: 2 | Status: AC

## 2020-05-11 NOTE — Progress Notes (Signed)
Patient Care Team: Emeterio Reeve, DO as PCP - General (Osteopathic Medicine) [name], MD as Consulting Physician (Obstetrics and Gynecology) Mauro Kaufmann, RN as Oncology Nurse Navigator Rockwell Germany, RN as Oncology Nurse Navigator  DIAGNOSIS:    ICD-10-CM   1. Malignant neoplasm of upper-outer quadrant of right breast in female, estrogen receptor positive ([place])  C50.411    Z17.0     SUMMARY OF ONCOLOGIC HISTORY: Oncology History  Malignant neoplasm of upper outer quadrant of female breast ([place])  01/28/2020 Initial Diagnosis   Patient palpated a right breast lump. Diagnostic mammogram and US showed calcifications spanning 2.0cm in the right breast. Biopsy showed invasive and in situ carcinoma, grade 2. 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 Testing was completed through the CustomNext-Cancer + RNAinsight panel offered by [facility]. 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 The report date is 02/13/2020.  The CustomNext-Cancer+RNAinsight panel offered by [facility] includes sequencing and rearrangement analysis for the following 47 genes:  APC, ATM, AXIN2, BARD1, BMPR1A, BRCA1, BRCA2, BRIP1, CDH1, CDK4, CDKN2A, CHEK2, DICER1, EPCAM, GREM1, HOXB13, MEN1, MLH1, MSH2, MSH3, MSH6, MUTYH, NBN, NF1, NF2, NTHL1, PALB2, PMS2, POLD1, POLE, PTEN, RAD51C, RAD51D, RECQL, RET, SDHA, SDHAF2, SDHB, SDHC,  SDHD, SMAD4, SMARCA4, STK11, TP53, TSC1, TSC2, and VHL.  RNA data is routinely analyzed for use in variant interpretation for all genes.     CHIEF COMPLIANT: Cycle 1 Taxol and Carboplatin  INTERVAL HISTORY: Sydney Herman is a 40 y.o. with above-mentioned history of invasive ductal carcinoma of the right breast currently on neoadjuvant chemotherapy with Taxol and Carboplatin after completing 4 cycles of Adriamycin/Cytoxan/Pembrolizumab. She tested positive for COVID-19 on 04/21/20, delaying chemotherapy, and received monoclonal antibodies on 04/22/20. She presents to the clinic todayfor treatment.   ALLERGIES:  is allergic to amoxil [amoxicillin].  MEDICATIONS:  Current Outpatient Medications  Medication Sig Dispense Refill  . cycloSPORINE (RESTASIS) 0.05 % ophthalmic emulsion INSILL 1 DROP INTO BOTH EYES TWICE DAILY 180 mL 4  . doxycycline (VIBRA-TABS) 100 MG tablet TAKE 1 TABLET BY MOUTH 2 TIMES DAILY FOR 7 DAYS 14 tablet 0  . escitalopram (LEXAPRO) 5 MG tablet Take 2 tablets (10 mg total) by mouth daily. 90 tablet 3  . lidocaine-prilocaine (EMLA) cream APPLY TO AFFECTED AREA ONCE 30 g 3  . LORazepam (ATIVAN) 0.5 MG tablet TAKE 1 TABLET BY MOUTH AT BEDTIME AS NEEDED FOR NAUSEA OR VOMITING 30 tablet 0  . ondansetron (ZOFRAN) 8 MG tablet TAKE 1 TABLET BY MOUTH 2 TIMES DAILY AS NEEDED START ON THE THIRD DAY AFTER CHEMO 30 tablet 1  . prochlorperazine (COMPAZINE) 10 MG tablet TAKE 1 TABLET BY MOUTH EVERY 6 HOURS AS NEEDED FOR NAUSEA OR VOMITING 30 tablet 1  . RESTASIS 0.05 % ophthalmic emulsion Place 1 drop into the right eye 2 (two) times daily.     No current facility-administered medications for  this visit.    PHYSICAL EXAMINATION: ECOG PERFORMANCE STATUS: 1 - Symptomatic but completely ambulatory  Vitals:   05/12/20 0908  BP: 113/65  Pulse: 77  Resp: 18  Temp: 97.7 F (36.5 C)  SpO2: 100%   Filed Weights   05/12/20 0908  Weight: 121 lb 14.4 oz (55.3 kg)    LABORATORY DATA:   I have reviewed the data as listed CMP Latest Ref Rng & Units 04/09/2020 03/19/2020 02/27/2020  Glucose 70 - 99 mg/dL 91 91 86  BUN 6 - 20 mg/dL 14 11 10  Creatinine 0.44 - 1.00 mg/dL 0.71 0.77 0.73  Sodium 135 - 145 mmol/L 141 140 136  Potassium 3.5 - 5.1 mmol/L 4.0 3.9 3.9  Chloride 98 - 111 mmol/L 104 106 103  CO2 22 - 32 mmol/L 24 23 26  Calcium 8.9 - 10.3 mg/dL 9.3 9.2 9.2  Total Protein 6.5 - 8.1 g/dL 7.9 7.7 7.7  Total Bilirubin 0.3 - 1.2 mg/dL 0.4 0.3 0.4  Alkaline Phos 38 - 126 U/L 60 68 58  AST 15 - 41 U/L 27 31 32  ALT 0 - 44 U/L 22 28 26    Lab Results  Component Value Date   WBC 5.5 05/12/2020   HGB 11.1 (L) 05/12/2020   HCT 33.1 (L) 05/12/2020   MCV 94.0 05/12/2020   PLT 244 05/12/2020   NEUTROABS 3.4 05/12/2020    ASSESSMENT & PLAN:  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: Palpable right breast lump: Mammogram and ultrasound revealed calcifications spanning 2 cm, biopsy revealed IDC with DCIS, grade 2, ER 5% weak, PR 0%, HER2 equivocal by IHC, FISH negative ratio 1.67 Ki-67 10%   BRCA2 positive: Risk discussion regarding future breast cancer, and ovarian cancer risk, discussed risk reducing bilateral mastectomies and RRSO.  Treatment plan: 1. Neo- adjuvant chemotherapy with dose dense Adriamycin and Cytoxan/Keytrudafollowed by Taxol and carboplatin 2. bilateral mastectomies with sentinel lymph node biopsy 3. Adjuvant radiation 4. followed by adjuvant antiestrogen therapy 5. Genetic testing _______________________________________________________________________ Current Treatment: Neoadjuvant chemotherapy with Adriamycin/Cytoxan/Keytruda, cycle4day 1 Echocardiogram 01/31/2020 EF 60-65%  Chemo toxicities: 1.  Alopecia 2. fatigue: Stable 3.  Chemotherapy-induced anemia: Hemoglobin 11.1: Monitoring  Patient is still working part-time. COVID infection: Received monoclonal antibody treatments.  This delayed her  treatment slightly.  She is ready to resume.  Return to clinic in 3 weeks for cycle 1 Taxol Carboplatin with Keytruda    No orders of the defined types were placed in this encounter.  The patient has a good understanding of the overall plan. she agrees with it. she will call with any problems that may develop before the next visit here.  Total time spent: 30 mins including face to face time and time spent for planning, charting and coordination of care  Vinay K Gudena, MD, MPH 05/12/2020  I, Molly Dorshimer, am acting as scribe for Dr. Vinay Gudena.  I have reviewed the above documentation for accuracy and completeness, and I agree with the above.       

## 2020-05-11 NOTE — Assessment & Plan Note (Signed)
01/28/2020: Palpable right breast lump: Mammogram and ultrasound revealed calcifications spanning 2 cm, biopsy revealed IDC with DCIS, grade 2, ER 5% weak, PR 0%, HER2 equivocal by IHC, FISH negative ratio 1.67 Ki-67 10%   BRCA2 positive: Risk discussion regarding future breast cancer, and ovarian cancer risk, discussed risk reducing bilateral mastectomies and RRSO.  Treatment plan: 1. Neo- adjuvant chemotherapy with dose dense Adriamycin and Cytoxan/Keytrudafollowed by Taxol and carboplatin 2. bilateral mastectomies with sentinel lymph node biopsy 3. Adjuvant radiation 4. followed by adjuvant antiestrogen therapy 5. Genetic testing _______________________________________________________________________ Current Treatment: Neoadjuvant chemotherapy with Adriamycin/Cytoxan/Keytruda, cycle4day 1 Echocardiogram 01/31/2020 EF 60-65%  Chemo toxicities: 1.  Alopecia 2. fatigue the first week after chemo 3.  Chemotherapy-induced anemia: Hemoglobin 11.1: Monitoring  Patient is still working part-time. She is looking forward to going on a couple of beach trips.  Return to clinic in 3 weeks for cycle 1 Taxol Carboplatin with Beryle Flock

## 2020-05-12 ENCOUNTER — Other Ambulatory Visit: Payer: 59

## 2020-05-12 ENCOUNTER — Inpatient Hospital Stay: Payer: 59 | Attending: Adult Health

## 2020-05-12 ENCOUNTER — Other Ambulatory Visit: Payer: Self-pay

## 2020-05-12 ENCOUNTER — Inpatient Hospital Stay (HOSPITAL_BASED_OUTPATIENT_CLINIC_OR_DEPARTMENT_OTHER): Payer: 59 | Admitting: Hematology and Oncology

## 2020-05-12 ENCOUNTER — Inpatient Hospital Stay: Payer: 59

## 2020-05-12 VITALS — BP 109/74 | HR 69 | Temp 98.5°F | Resp 16

## 2020-05-12 DIAGNOSIS — Z79899 Other long term (current) drug therapy: Secondary | ICD-10-CM | POA: Diagnosis not present

## 2020-05-12 DIAGNOSIS — D6481 Anemia due to antineoplastic chemotherapy: Secondary | ICD-10-CM | POA: Insufficient documentation

## 2020-05-12 DIAGNOSIS — T451X5A Adverse effect of antineoplastic and immunosuppressive drugs, initial encounter: Secondary | ICD-10-CM | POA: Insufficient documentation

## 2020-05-12 DIAGNOSIS — C50919 Malignant neoplasm of unspecified site of unspecified female breast: Secondary | ICD-10-CM | POA: Insufficient documentation

## 2020-05-12 DIAGNOSIS — D6959 Other secondary thrombocytopenia: Secondary | ICD-10-CM | POA: Diagnosis not present

## 2020-05-12 DIAGNOSIS — Z170 Estrogen receptor positive status [ER+]: Secondary | ICD-10-CM

## 2020-05-12 DIAGNOSIS — Z5111 Encounter for antineoplastic chemotherapy: Secondary | ICD-10-CM | POA: Diagnosis not present

## 2020-05-12 DIAGNOSIS — L658 Other specified nonscarring hair loss: Secondary | ICD-10-CM | POA: Insufficient documentation

## 2020-05-12 DIAGNOSIS — D702 Other drug-induced agranulocytosis: Secondary | ICD-10-CM | POA: Diagnosis not present

## 2020-05-12 DIAGNOSIS — R5383 Other fatigue: Secondary | ICD-10-CM | POA: Diagnosis not present

## 2020-05-12 DIAGNOSIS — C50411 Malignant neoplasm of upper-outer quadrant of right female breast: Secondary | ICD-10-CM

## 2020-05-12 DIAGNOSIS — Z5112 Encounter for antineoplastic immunotherapy: Secondary | ICD-10-CM | POA: Diagnosis not present

## 2020-05-12 DIAGNOSIS — Z95828 Presence of other vascular implants and grafts: Secondary | ICD-10-CM

## 2020-05-12 LAB — CMP (CANCER CENTER ONLY)
ALT: 26 U/L (ref 0–44)
AST: 34 U/L (ref 15–41)
Albumin: 4.4 g/dL (ref 3.5–5.0)
Alkaline Phosphatase: 56 U/L (ref 38–126)
Anion gap: 7 (ref 5–15)
BUN: 10 mg/dL (ref 6–20)
CO2: 28 mmol/L (ref 22–32)
Calcium: 9.6 mg/dL (ref 8.9–10.3)
Chloride: 104 mmol/L (ref 98–111)
Creatinine: 0.69 mg/dL (ref 0.44–1.00)
GFR, Estimated: >60 mL/min (ref >60)
Glucose, Bld: 74 mg/dL (ref 70–99)
Potassium: 4 mmol/L (ref 3.5–5.1)
Sodium: 139 mmol/L (ref 135–145)
Total Bilirubin: 0.4 mg/dL (ref 0.3–1.2)
Total Protein: 7.6 g/dL (ref 6.5–8.1)

## 2020-05-12 LAB — CBC WITH DIFFERENTIAL (CANCER CENTER ONLY)
Abs Immature Granulocytes: 0.01 10*3/uL (ref 0.00–0.07)
Basophils Absolute: 0 10*3/uL (ref 0.0–0.1)
Basophils Relative: 1 %
Eosinophils Absolute: 0 10*3/uL (ref 0.0–0.5)
Eosinophils Relative: 1 %
HCT: 33.1 % — ABNORMAL LOW (ref 36.0–46.0)
Hemoglobin: 11.1 g/dL — ABNORMAL LOW (ref 12.0–15.0)
Immature Granulocytes: 0 %
Lymphocytes Relative: 24 %
Lymphs Abs: 1.3 10*3/uL (ref 0.7–4.0)
MCH: 31.5 pg (ref 26.0–34.0)
MCHC: 33.5 g/dL (ref 30.0–36.0)
MCV: 94 fL (ref 80.0–100.0)
Monocytes Absolute: 0.7 10*3/uL (ref 0.1–1.0)
Monocytes Relative: 13 %
Neutro Abs: 3.4 10*3/uL (ref 1.7–7.7)
Neutrophils Relative %: 61 %
Platelet Count: 244 10*3/uL (ref 150–400)
RBC: 3.52 MIL/uL — ABNORMAL LOW (ref 3.87–5.11)
RDW: 15.6 % — ABNORMAL HIGH (ref 11.5–15.5)
WBC Count: 5.5 10*3/uL (ref 4.0–10.5)
nRBC: 0 % (ref 0.0–0.2)

## 2020-05-12 LAB — PREGNANCY, URINE: Preg Test, Ur: NEGATIVE

## 2020-05-12 LAB — TSH: TSH: 0.698 u[IU]/mL (ref 0.350–4.500)

## 2020-05-12 MED ORDER — DIPHENHYDRAMINE HCL 50 MG/ML IJ SOLN
INTRAMUSCULAR | Status: AC
  Filled 2020-05-12: qty 1

## 2020-05-12 MED ORDER — PALONOSETRON HCL INJECTION 0.25 MG/5ML
0.2500 mg | Freq: Once | INTRAVENOUS | Status: AC
  Administered 2020-05-12: 0.25 mg via INTRAVENOUS

## 2020-05-12 MED ORDER — FAMOTIDINE 20 MG IN NS 100 ML IVPB
20.0000 mg | Freq: Once | INTRAVENOUS | Status: AC
  Administered 2020-05-12: 20 mg via INTRAVENOUS

## 2020-05-12 MED ORDER — SODIUM CHLORIDE 0.9% FLUSH
10.0000 mL | Freq: Once | INTRAVENOUS | Status: DC
  Filled 2020-05-12: qty 10

## 2020-05-12 MED ORDER — SODIUM CHLORIDE 0.9% FLUSH
10.0000 mL | INTRAVENOUS | Status: DC | PRN
  Administered 2020-05-12: 10 mL
  Filled 2020-05-12: qty 10

## 2020-05-12 MED ORDER — HEPARIN SOD (PORK) LOCK FLUSH 100 UNIT/ML IV SOLN
500.0000 [IU] | Freq: Once | INTRAVENOUS | Status: AC | PRN
  Administered 2020-05-12: 500 [IU]
  Filled 2020-05-12: qty 5

## 2020-05-12 MED ORDER — SODIUM CHLORIDE 0.9 % IV SOLN
Freq: Once | INTRAVENOUS | Status: AC
  Filled 2020-05-12: qty 250

## 2020-05-12 MED ORDER — SODIUM CHLORIDE 0.9 % IV SOLN
10.0000 mg | Freq: Once | INTRAVENOUS | Status: AC
  Administered 2020-05-12: 10 mg via INTRAVENOUS
  Filled 2020-05-12: qty 10

## 2020-05-12 MED ORDER — PALONOSETRON HCL INJECTION 0.25 MG/5ML
INTRAVENOUS | Status: AC
  Filled 2020-05-12: qty 5

## 2020-05-12 MED ORDER — DIPHENHYDRAMINE HCL 50 MG/ML IJ SOLN
25.0000 mg | Freq: Once | INTRAMUSCULAR | Status: AC
  Administered 2020-05-12: 25 mg via INTRAVENOUS

## 2020-05-12 MED ORDER — SODIUM CHLORIDE 0.9 % IV SOLN
80.0000 mg/m2 | Freq: Once | INTRAVENOUS | Status: AC
  Administered 2020-05-12: 126 mg via INTRAVENOUS
  Filled 2020-05-12: qty 21

## 2020-05-12 MED ORDER — SODIUM CHLORIDE 0.9 % IV SOLN
150.0000 mg | Freq: Once | INTRAVENOUS | Status: AC
  Administered 2020-05-12: 150 mg via INTRAVENOUS
  Filled 2020-05-12: qty 150

## 2020-05-12 MED ORDER — FAMOTIDINE 20 MG IN NS 100 ML IVPB
INTRAVENOUS | Status: AC
  Filled 2020-05-12: qty 100

## 2020-05-12 MED ORDER — SODIUM CHLORIDE 0.9 % IV SOLN
200.0000 mg | Freq: Once | INTRAVENOUS | Status: AC
  Administered 2020-05-12: 200 mg via INTRAVENOUS
  Filled 2020-05-12: qty 8

## 2020-05-12 MED ORDER — SODIUM CHLORIDE 0.9 % IV SOLN
700.0000 mg | Freq: Once | INTRAVENOUS | Status: AC
  Administered 2020-05-12: 700 mg via INTRAVENOUS
  Filled 2020-05-12: qty 70

## 2020-05-12 NOTE — Patient Instructions (Signed)
[facility]  Discharge Instructions: Thank you for choosing [facility] to provide your oncology and hematology care.   If you have a lab appointment with the [facility], please go directly to the [facility] and check in at the registration area.   Wear comfortable clothing and clothing appropriate for easy access to any Portacath or PICC line.   We strive to give you quality time with your provider. You may need to reschedule your appointment if you arrive late (15 or more minutes).  Arriving late affects you and other patients whose appointments are after yours.  Also, if you miss three or more appointments without notifying the office, you may be dismissed from the clinic at the provider's discretion.      For prescription refill requests, have your pharmacy contact our office and allow 72 hours for refills to be completed.    Today you received the following chemotherapy and/or immunotherapy agents: pembrolizumab, paclitaxel, and carboplatin.      To help prevent nausea and vomiting after your treatment, we encourage you to take your nausea medication as directed.  BELOW ARE SYMPTOMS THAT SHOULD BE REPORTED IMMEDIATELY: . *FEVER GREATER THAN 100.4 F (38 C) OR HIGHER . *CHILLS OR SWEATING . *NAUSEA AND VOMITING THAT IS NOT CONTROLLED WITH YOUR NAUSEA MEDICATION . *UNUSUAL SHORTNESS OF BREATH . *UNUSUAL BRUISING OR BLEEDING . *URINARY PROBLEMS (pain or burning when urinating, or frequent urination) . *BOWEL PROBLEMS (unusual diarrhea, constipation, pain near the anus) . TENDERNESS IN MOUTH AND THROAT WITH OR WITHOUT PRESENCE OF ULCERS (sore throat, sores in mouth, or a toothache) . UNUSUAL RASH, SWELLING OR PAIN  . UNUSUAL VAGINAL DISCHARGE OR ITCHING   Items with * indicate a potential emergency and should be followed up as soon as possible or go to the Emergency Department if any problems should occur.  Please show the CHEMOTHERAPY  ALERT CARD or IMMUNOTHERAPY ALERT CARD at check-in to the Emergency Department and triage nurse.  Should you have questions after your visit or need to cancel or reschedule your appointment, please contact [facility]  Dept: [phone]  and follow the prompts.  Office hours are 8:00 a.m. to 4:30 p.m. Monday - Friday. Please note that voicemails left after 4:00 p.m. may not be returned until the following business day.  We are closed weekends and major holidays. You have access to a nurse at all times for urgent questions. Please call the main number to the clinic Dept: [phone] and follow the prompts.   For any non-urgent questions, you may also contact your provider using MyChart. We now offer e-Visits for anyone 84 and older to request care online for non-urgent symptoms. For details visit [email or web address].   Also download the MyChart app! Go to the app store, search "MyChart", open the app, select [facility], and log in with your MyChart username and password.  Due to Covid, a mask is required upon entering the hospital/clinic. If you do not have a mask, one will be given to you upon arrival. For doctor visits, patients may have 1 support person aged 48 or older with them. For treatment visits, patients cannot have anyone with them due to current Covid guidelines and our immunocompromised population.   Paclitaxel injection What is this medicine? PACLITAXEL (PAK li TAX el) is a chemotherapy drug. It targets fast dividing cells, like cancer cells, and causes these cells to die. This medicine is used to treat ovarian  cancer, breast cancer, lung cancer, Kaposi's sarcoma, and other cancers. This medicine may be used for other purposes; ask your health care provider or pharmacist if you have questions. COMMON BRAND NAME(S): Onxol, Taxol What should I tell my health care provider before I take this medicine? They need to know if you have any of these  conditions:  history of irregular heartbeat  liver disease  low blood counts, like low white cell, platelet, or red cell counts  lung or breathing disease, like asthma  tingling of the fingers or toes, or other nerve disorder  an unusual or allergic reaction to paclitaxel, alcohol, polyoxyethylated castor oil, other chemotherapy, other medicines, foods, dyes, or preservatives  pregnant or trying to get pregnant  breast-feeding How should I use this medicine? This drug is given as an infusion into a vein. It is administered in a hospital or clinic by a specially trained health care professional. Talk to your pediatrician regarding the use of this medicine in children. Special care may be needed. Overdosage: If you think you have taken too much of this medicine contact a poison control center or emergency room at once. NOTE: This medicine is only for you. Do not share this medicine with others. What if I miss a dose? It is important not to miss your dose. Call your doctor or health care professional if you are unable to keep an appointment. What may interact with this medicine? Do not take this medicine with any of the following medications:  live virus vaccines This medicine may also interact with the following medications:  antiviral medicines for hepatitis, HIV or AIDS  certain antibiotics like erythromycin and clarithromycin  certain medicines for fungal infections like ketoconazole and itraconazole  certain medicines for seizures like carbamazepine, phenobarbital, phenytoin  gemfibrozil  nefazodone  rifampin  St. John's wort This list may not describe all possible interactions. Give your health care provider a list of all the medicines, herbs, non-prescription drugs, or dietary supplements you use. Also tell them if you smoke, drink alcohol, or use illegal drugs. Some items may interact with your medicine. What should I watch for while using this medicine? Your  condition will be monitored carefully while you are receiving this medicine. You will need important blood work done while you are taking this medicine. This medicine can cause serious allergic reactions. To reduce your risk you will need to take other medicine(s) before treatment with this medicine. If you experience allergic reactions like skin rash, itching or hives, swelling of the face, lips, or tongue, tell your doctor or health care professional right away. In some cases, you may be given additional medicines to help with side effects. Follow all directions for their use. This drug may make you feel generally unwell. This is not uncommon, as chemotherapy can affect healthy cells as well as cancer cells. Report any side effects. Continue your course of treatment even though you feel ill unless your doctor tells you to stop. Call your doctor or health care professional for advice if you get a fever, chills or sore throat, or other symptoms of a cold or flu. Do not treat yourself. This drug decreases your body's ability to fight infections. Try to avoid being around people who are sick. This medicine may increase your risk to bruise or bleed. Call your doctor or health care professional if you notice any unusual bleeding. Be careful brushing and flossing your teeth or using a toothpick because you may get an infection or bleed more  easily. If you have any dental work done, tell your dentist you are receiving this medicine. Avoid taking products that contain aspirin, acetaminophen, ibuprofen, naproxen, or ketoprofen unless instructed by your doctor. These medicines may hide a fever. Do not become pregnant while taking this medicine. Women should inform their doctor if they wish to become pregnant or think they might be pregnant. There is a potential for serious side effects to an unborn child. Talk to your health care professional or pharmacist for more information. Do not breast-feed an infant while  taking this medicine. Men are advised not to father a child while receiving this medicine. This product may contain alcohol. Ask your pharmacist or healthcare provider if this medicine contains alcohol. Be sure to tell all healthcare providers you are taking this medicine. Certain medicines, like metronidazole and disulfiram, can cause an unpleasant reaction when taken with alcohol. The reaction includes flushing, headache, nausea, vomiting, sweating, and increased thirst. The reaction can last from 30 minutes to several hours. What side effects may I notice from receiving this medicine? Side effects that you should report to your doctor or health care professional as soon as possible:  allergic reactions like skin rash, itching or hives, swelling of the face, lips, or tongue  breathing problems  changes in vision  fast, irregular heartbeat  high or low blood pressure  mouth sores  pain, tingling, numbness in the hands or feet  signs of decreased platelets or bleeding - bruising, pinpoint red spots on the skin, black, tarry stools, blood in the urine  signs of decreased red blood cells - unusually weak or tired, feeling faint or lightheaded, falls  signs of infection - fever or chills, cough, sore throat, pain or difficulty passing urine  signs and symptoms of liver injury like dark yellow or brown urine; general ill feeling or flu-like symptoms; light-colored stools; loss of appetite; nausea; right upper belly pain; unusually weak or tired; yellowing of the eyes or skin  swelling of the ankles, feet, hands  unusually slow heartbeat Side effects that usually do not require medical attention (report to your doctor or health care professional if they continue or are bothersome):  diarrhea  hair loss  loss of appetite  muscle or joint pain  nausea, vomiting  pain, redness, or irritation at site where injected  tiredness This list may not describe all possible side effects.  Call your doctor for medical advice about side effects. You may report side effects to FDA at 1-800-FDA-1088. Where should I keep my medicine? This drug is given in a hospital or clinic and will not be stored at home. NOTE: This sheet is a summary. It may not cover all possible information. If you have questions about this medicine, talk to your doctor, pharmacist, or health care provider.  2021 Elsevier/Gold Standard (2018-11-28 13:37:23)  Carboplatin injection What is this medicine? CARBOPLATIN (KAR boe pla tin) is a chemotherapy drug. It targets fast dividing cells, like cancer cells, and causes these cells to die. This medicine is used to treat ovarian cancer and many other cancers. This medicine may be used for other purposes; ask your health care provider or pharmacist if you have questions. COMMON BRAND NAME(S): Paraplatin What should I tell my health care provider before I take this medicine? They need to know if you have any of these conditions:  blood disorders  hearing problems  kidney disease  recent or ongoing radiation therapy  an unusual or allergic reaction to carboplatin, cisplatin, other chemotherapy,  other medicines, foods, dyes, or preservatives  pregnant or trying to get pregnant  breast-feeding How should I use this medicine? This drug is usually given as an infusion into a vein. It is administered in a hospital or clinic by a specially trained health care professional. Talk to your pediatrician regarding the use of this medicine in children. Special care may be needed. Overdosage: If you think you have taken too much of this medicine contact a poison control center or emergency room at once. NOTE: This medicine is only for you. Do not share this medicine with others. What if I miss a dose? It is important not to miss a dose. Call your doctor or health care professional if you are unable to keep an appointment. What may interact with this  medicine?  medicines for seizures  medicines to increase blood counts like filgrastim, pegfilgrastim, sargramostim  some antibiotics like amikacin, gentamicin, neomycin, streptomycin, tobramycin  vaccines Talk to your doctor or health care professional before taking any of these medicines:  acetaminophen  aspirin  ibuprofen  ketoprofen  naproxen This list may not describe all possible interactions. Give your health care provider a list of all the medicines, herbs, non-prescription drugs, or dietary supplements you use. Also tell them if you smoke, drink alcohol, or use illegal drugs. Some items may interact with your medicine. What should I watch for while using this medicine? Your condition will be monitored carefully while you are receiving this medicine. You will need important blood work done while you are taking this medicine. This drug may make you feel generally unwell. This is not uncommon, as chemotherapy can affect healthy cells as well as cancer cells. Report any side effects. Continue your course of treatment even though you feel ill unless your doctor tells you to stop. In some cases, you may be given additional medicines to help with side effects. Follow all directions for their use. Call your doctor or health care professional for advice if you get a fever, chills or sore throat, or other symptoms of a cold or flu. Do not treat yourself. This drug decreases your body's ability to fight infections. Try to avoid being around people who are sick. This medicine may increase your risk to bruise or bleed. Call your doctor or health care professional if you notice any unusual bleeding. Be careful brushing and flossing your teeth or using a toothpick because you may get an infection or bleed more easily. If you have any dental work done, tell your dentist you are receiving this medicine. Avoid taking products that contain aspirin, acetaminophen, ibuprofen, naproxen, or ketoprofen  unless instructed by your doctor. These medicines may hide a fever. Do not become pregnant while taking this medicine. Women should inform their doctor if they wish to become pregnant or think they might be pregnant. There is a potential for serious side effects to an unborn child. Talk to your health care professional or pharmacist for more information. Do not breast-feed an infant while taking this medicine. What side effects may I notice from receiving this medicine? Side effects that you should report to your doctor or health care professional as soon as possible:  allergic reactions like skin rash, itching or hives, swelling of the face, lips, or tongue  signs of infection - fever or chills, cough, sore throat, pain or difficulty passing urine  signs of decreased platelets or bleeding - bruising, pinpoint red spots on the skin, black, tarry stools, nosebleeds  signs of decreased red blood  cells - unusually weak or tired, fainting spells, lightheadedness  breathing problems  changes in hearing  changes in vision  chest pain  high blood pressure  low blood counts - This drug may decrease the number of white blood cells, red blood cells and platelets. You may be at increased risk for infections and bleeding.  nausea and vomiting  pain, swelling, redness or irritation at the injection site  pain, tingling, numbness in the hands or feet  problems with balance, talking, walking  trouble passing urine or change in the amount of urine Side effects that usually do not require medical attention (report to your doctor or health care professional if they continue or are bothersome):  hair loss  loss of appetite  metallic taste in the mouth or changes in taste This list may not describe all possible side effects. Call your doctor for medical advice about side effects. You may report side effects to FDA at 1-800-FDA-1088. Where should I keep my medicine? This drug is given in a  hospital or clinic and will not be stored at home. NOTE: This sheet is a summary. It may not cover all possible information. If you have questions about this medicine, talk to your doctor, pharmacist, or health care provider.  2021 Elsevier/Gold Standard (2007-04-03 14:38:05)

## 2020-05-13 ENCOUNTER — Telehealth: Payer: Self-pay | Admitting: Hematology and Oncology

## 2020-05-13 ENCOUNTER — Encounter: Payer: Self-pay | Admitting: Hematology and Oncology

## 2020-05-13 NOTE — Telephone Encounter (Signed)
R/s appt per 5/4 sch msg. Pt aware.  

## 2020-05-18 NOTE — Progress Notes (Signed)
Patient Care Team: Emeterio Reeve, DO as PCP - General (Osteopathic Medicine) [name], MD as Consulting Physician (Obstetrics and Gynecology) Mauro Kaufmann, RN as Oncology Nurse Navigator Rockwell Germany, RN as Oncology Nurse Navigator  DIAGNOSIS:    ICD-10-CM   1. Malignant neoplasm of upper-outer quadrant of right breast in female, estrogen receptor positive ([facility])  C50.411    Z17.0     SUMMARY OF ONCOLOGIC HISTORY: Oncology History  Malignant neoplasm of upper outer quadrant of female breast ([place])  01/28/2020 Initial Diagnosis   Patient palpated a right breast lump. Diagnostic mammogram and US showed calcifications spanning 2.0cm in the right breast. Biopsy showed invasive and in situ carcinoma, grade 2. 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 Testing was completed through the CustomNext-Cancer + RNAinsight panel offered by [facility]. 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 The report date is 02/13/2020.  The CustomNext-Cancer+RNAinsight panel offered by [facility] includes sequencing and rearrangement analysis for the following 47 genes:  APC, ATM, AXIN2, BARD1, BMPR1A, BRCA1, BRCA2, BRIP1, CDH1, CDK4, CDKN2A, CHEK2, DICER1, EPCAM, GREM1, HOXB13, MEN1, MLH1, MSH2, MSH3, MSH6, MUTYH, NBN, NF1, NF2, NTHL1, PALB2, PMS2, POLD1, POLE, PTEN, RAD51C, RAD51D, RECQL, RET, SDHA, SDHAF2, SDHB, SDHC,  SDHD, SMAD4, SMARCA4, STK11, TP53, TSC1, TSC2, and VHL.  RNA data is routinely analyzed for use in variant interpretation for all genes.     CHIEF COMPLIANT: Cycle 2 Taxol (Keytruda and Carboplatin every 3 weeks)  INTERVAL HISTORY: Sydney Herman is a 40 y.o. with above-mentioned history of right cancer currently on neoadjuvant chemotherapy withTaxol, Keytruda, and Carboplatin after completing 4 cycles of Adriamycin/Cytoxan/Pembrolizumab. She presents to the clinic todayfora toxicity check following cycle 1.  She tolerated cycle 1 of Taxol carbo with Keytruda extremely well.  She had some mild fatigue but otherwise without any major toxicities.  ALLERGIES:  is allergic to amoxil [amoxicillin].  MEDICATIONS:  Current Outpatient Medications  Medication Sig Dispense Refill  . cycloSPORINE (RESTASIS) 0.05 % ophthalmic emulsion INSILL 1 DROP INTO BOTH EYES TWICE DAILY 180 mL 4  . doxycycline (VIBRA-TABS) 100 MG tablet TAKE 1 TABLET BY MOUTH 2 TIMES DAILY FOR 7 DAYS 14 tablet 0  . escitalopram (LEXAPRO) 5 MG tablet Take 2 tablets (10 mg total) by mouth daily. 90 tablet 3  . lidocaine-prilocaine (EMLA) cream APPLY TO AFFECTED AREA ONCE 30 g 3  . loratadine (CLARITIN) 10 MG tablet 10 mg.    . LORazepam (ATIVAN) 0.5 MG tablet TAKE 1 TABLET BY MOUTH AT BEDTIME AS NEEDED FOR NAUSEA OR VOMITING 30 tablet 0  . ondansetron (ZOFRAN) 8 MG tablet TAKE 1 TABLET BY MOUTH 2 TIMES DAILY AS NEEDED START ON THE THIRD DAY AFTER CHEMO 30 tablet 1  . prochlorperazine (COMPAZINE) 10 MG tablet TAKE 1 TABLET BY MOUTH EVERY 6 HOURS AS NEEDED FOR NAUSEA OR VOMITING 30 tablet 1  . RESTASIS 0.05 %  ophthalmic emulsion Place 1 drop into the right eye 2 (two) times daily.     No current facility-administered medications for this visit.   Facility-Administered Medications Ordered in Other Visits  Medication Dose Route Frequency Provider Last Rate Last Admin  . 0.9 %  sodium chloride infusion   Intravenous Once Nicholas Lose, MD      . dexamethasone (DECADRON) 20 mg in sodium chloride 0.9 % 50 mL IVPB  20 mg Intravenous Once Nicholas Lose, MD      . diphenhydrAMINE (BENADRYL) injection 25 mg  25 mg Intravenous Once Nicholas Lose, MD      . famotidine (PEPCID) IVPB 20 mg in NS 100 mL IVPB  20 mg Intravenous Once Nicholas Lose, MD      . heparin lock flush 100 unit/mL  500 Units Intracatheter Once PRN Nicholas Lose, MD      . PACLitaxel (TAXOL) 126 mg in sodium chloride 0.9 % 250 mL chemo infusion (</= 15m/m2)  80 mg/m2 (Treatment Plan Recorded) Intravenous Once GNicholas Lose MD      . sodium chloride flush (NS) 0.9 % injection 10 mL  10 mL Intracatheter PRN GNicholas Lose MD        PHYSICAL EXAMINATION: ECOG PERFORMANCE STATUS: 1 - Symptomatic but completely ambulatory  Vitals:   05/19/20 0944  BP: 111/77  Pulse: 77  Resp: 18  Temp: 97.7 F (36.5 C)  SpO2: 100%   Filed Weights   05/19/20 0944  Weight: 121 lb 6.4 oz (55.1 kg)     LABORATORY DATA:  I have reviewed the data as listed CMP Latest Ref Rng & Units 05/19/2020 05/12/2020 04/09/2020  Glucose 70 - 99 mg/dL 97 74 91  BUN 6 - 20 mg/dL '13 10 14  ' Creatinine 0.44 - 1.00 mg/dL 0.68 0.69 0.71  Sodium 135 - 145 mmol/L 139 139 141  Potassium 3.5 - 5.1 mmol/L 3.8 4.0 4.0  Chloride 98 - 111 mmol/L 101 104 104  CO2 22 - 32 mmol/L '29 28 24  ' Calcium 8.9 - 10.3 mg/dL 9.5 9.6 9.3  Total Protein 6.5 - 8.1 g/dL 7.6 7.6 7.9  Total Bilirubin 0.3 - 1.2 mg/dL 0.6 0.4 0.4  Alkaline Phos 38 - 126 U/L 54 56 60  AST 15 - 41 U/L 33 34 27  ALT 0 - 44 U/L '29 26 22    ' Lab Results  Component Value Date   WBC 3.3 (L) 05/19/2020   HGB 10.4 (L) 05/19/2020   HCT 30.9 (L) 05/19/2020   MCV 93.6 05/19/2020   PLT 142 (L) 05/19/2020   NEUTROABS 2.1 05/19/2020    ASSESSMENT & PLAN:  Malignant neoplasm of upper outer quadrant of female breast ([place] 01/28/2020: Palpable right breast lump: Mammogram and ultrasound revealed calcifications spanning 2 cm, biopsy  revealed IDC with DCIS, grade 2, 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  Treatment plan: 1. Neo- adjuvant chemotherapy with dose dense Adriamycin and Cytoxan/Keytrudafollowed by Taxol and carboplatin 2.bilateral mastectomieswith sentinel lymph node biopsy 3. Adjuvant radiation 4. followed by adjuvant antiestrogen therapy 5. Genetic testing _______________________________________________________________________ Current Treatment: Completed 4 cycles of neoadjuvant chemotherapy with Adriamycin/Cytoxan/Keytruda, today cycle 2 Taxol (carboplatin and Keytruda every 3 weeks) Echocardiogram 01/31/2020 EF 60-65%  Chemo toxicities: 1.Alopecia 2.fatigue: Stable 3.Chemotherapy-induced anemia: Hemoglobin 10.4: Monitoring Monitoring closely for neuropathy.  Patient is still working part-time. COVID infection:  Received monoclonal antibody treatments.  This delayed her treatment slightly.   Return to clinic weekly for Taxol treatments and carboplatin and Keytruda every 3 weeks.    No orders of the defined types were placed in this encounter.  The patient has a good understanding of the overall plan. she agrees with it. she will call with any problems that may develop before the next visit here.  Total time spent: 30 mins including face to face time and time spent for planning, charting and coordination of care  Rulon Eisenmenger, MD, MPH 05/19/2020  I, Molly Dorshimer, am acting as scribe for Dr. Nicholas Lose.  I have reviewed the above documentation for accuracy and completeness, and I agree with the above.

## 2020-05-19 ENCOUNTER — Inpatient Hospital Stay: Payer: 59

## 2020-05-19 ENCOUNTER — Other Ambulatory Visit: Payer: 59

## 2020-05-19 ENCOUNTER — Encounter: Payer: Self-pay | Admitting: *Deleted

## 2020-05-19 ENCOUNTER — Other Ambulatory Visit: Payer: Self-pay

## 2020-05-19 ENCOUNTER — Other Ambulatory Visit: Payer: Self-pay | Admitting: Pharmacist

## 2020-05-19 ENCOUNTER — Inpatient Hospital Stay (HOSPITAL_BASED_OUTPATIENT_CLINIC_OR_DEPARTMENT_OTHER): Payer: 59 | Admitting: Hematology and Oncology

## 2020-05-19 DIAGNOSIS — Z170 Estrogen receptor positive status [ER+]: Secondary | ICD-10-CM

## 2020-05-19 DIAGNOSIS — C50411 Malignant neoplasm of upper-outer quadrant of right female breast: Secondary | ICD-10-CM

## 2020-05-19 DIAGNOSIS — Z5111 Encounter for antineoplastic chemotherapy: Secondary | ICD-10-CM | POA: Diagnosis not present

## 2020-05-19 DIAGNOSIS — L658 Other specified nonscarring hair loss: Secondary | ICD-10-CM | POA: Diagnosis not present

## 2020-05-19 DIAGNOSIS — D6481 Anemia due to antineoplastic chemotherapy: Secondary | ICD-10-CM | POA: Diagnosis not present

## 2020-05-19 DIAGNOSIS — Z95828 Presence of other vascular implants and grafts: Secondary | ICD-10-CM

## 2020-05-19 DIAGNOSIS — D702 Other drug-induced agranulocytosis: Secondary | ICD-10-CM | POA: Diagnosis not present

## 2020-05-19 DIAGNOSIS — R5383 Other fatigue: Secondary | ICD-10-CM | POA: Diagnosis not present

## 2020-05-19 DIAGNOSIS — D6959 Other secondary thrombocytopenia: Secondary | ICD-10-CM | POA: Diagnosis not present

## 2020-05-19 DIAGNOSIS — Z5112 Encounter for antineoplastic immunotherapy: Secondary | ICD-10-CM | POA: Diagnosis not present

## 2020-05-19 LAB — CBC WITH DIFFERENTIAL (CANCER CENTER ONLY)
Abs Immature Granulocytes: 0.01 10*3/uL (ref 0.00–0.07)
Basophils Absolute: 0 10*3/uL (ref 0.0–0.1)
Basophils Relative: 0 %
Eosinophils Absolute: 0 10*3/uL (ref 0.0–0.5)
Eosinophils Relative: 1 %
HCT: 30.9 % — ABNORMAL LOW (ref 36.0–46.0)
Hemoglobin: 10.4 g/dL — ABNORMAL LOW (ref 12.0–15.0)
Immature Granulocytes: 0 %
Lymphocytes Relative: 29 %
Lymphs Abs: 1 10*3/uL (ref 0.7–4.0)
MCH: 31.5 pg (ref 26.0–34.0)
MCHC: 33.7 g/dL (ref 30.0–36.0)
MCV: 93.6 fL (ref 80.0–100.0)
Monocytes Absolute: 0.3 10*3/uL (ref 0.1–1.0)
Monocytes Relative: 8 %
Neutro Abs: 2.1 10*3/uL (ref 1.7–7.7)
Neutrophils Relative %: 62 %
Platelet Count: 142 10*3/uL — ABNORMAL LOW (ref 150–400)
RBC: 3.3 MIL/uL — ABNORMAL LOW (ref 3.87–5.11)
RDW: 14 % (ref 11.5–15.5)
WBC Count: 3.3 10*3/uL — ABNORMAL LOW (ref 4.0–10.5)
nRBC: 0 % (ref 0.0–0.2)

## 2020-05-19 LAB — CMP (CANCER CENTER ONLY)
ALT: 29 U/L (ref 0–44)
AST: 33 U/L (ref 15–41)
Albumin: 4.3 g/dL (ref 3.5–5.0)
Alkaline Phosphatase: 54 U/L (ref 38–126)
Anion gap: 9 (ref 5–15)
BUN: 13 mg/dL (ref 6–20)
CO2: 29 mmol/L (ref 22–32)
Calcium: 9.5 mg/dL (ref 8.9–10.3)
Chloride: 101 mmol/L (ref 98–111)
Creatinine: 0.68 mg/dL (ref 0.44–1.00)
GFR, Estimated: >60 mL/min (ref >60)
Glucose, Bld: 97 mg/dL (ref 70–99)
Potassium: 3.8 mmol/L (ref 3.5–5.1)
Sodium: 139 mmol/L (ref 135–145)
Total Bilirubin: 0.6 mg/dL (ref 0.3–1.2)
Total Protein: 7.6 g/dL (ref 6.5–8.1)

## 2020-05-19 LAB — TSH: TSH: 0.61 u[IU]/mL (ref 0.308–3.960)

## 2020-05-19 LAB — PREGNANCY, URINE: Preg Test, Ur: NEGATIVE

## 2020-05-19 MED ORDER — SODIUM CHLORIDE 0.9 % IV SOLN: 20.0000 mg | Freq: Once | INTRAVENOUS | Status: DC

## 2020-05-19 MED ORDER — SODIUM CHLORIDE 0.9% FLUSH
10.0000 mL | Freq: Once | INTRAVENOUS | Status: AC
  Administered 2020-05-19: 10 mL
  Filled 2020-05-19: qty 10

## 2020-05-19 MED ORDER — SODIUM CHLORIDE 0.9 % IV SOLN
10.0000 mg | Freq: Once | INTRAVENOUS | Status: AC
Start: 2020-05-19 — End: 2020-05-19
  Administered 2020-05-19: 10 mg via INTRAVENOUS
  Filled 2020-05-19: qty 10

## 2020-05-19 MED ORDER — DIPHENHYDRAMINE HCL 50 MG/ML IJ SOLN
25.0000 mg | Freq: Once | INTRAMUSCULAR | Status: AC
  Administered 2020-05-19: 25 mg via INTRAVENOUS

## 2020-05-19 MED ORDER — FAMOTIDINE 20 MG IN NS 100 ML IVPB
20.0000 mg | Freq: Once | INTRAVENOUS | Status: AC
  Administered 2020-05-19: 20 mg via INTRAVENOUS

## 2020-05-19 MED ORDER — HEPARIN SOD (PORK) LOCK FLUSH 100 UNIT/ML IV SOLN
500.0000 [IU] | Freq: Once | INTRAVENOUS | Status: AC | PRN
  Administered 2020-05-19: 500 [IU]
  Filled 2020-05-19: qty 5

## 2020-05-19 MED ORDER — SODIUM CHLORIDE 0.9% FLUSH
10.0000 mL | INTRAVENOUS | Status: DC | PRN
  Administered 2020-05-19: 10 mL
  Filled 2020-05-19: qty 10

## 2020-05-19 MED ORDER — SODIUM CHLORIDE 0.9 % IV SOLN
Freq: Once | INTRAVENOUS | Status: AC
  Filled 2020-05-19: qty 250

## 2020-05-19 MED ORDER — SODIUM CHLORIDE 0.9 % IV SOLN
80.0000 mg/m2 | Freq: Once | INTRAVENOUS | Status: AC
  Administered 2020-05-19: 126 mg via INTRAVENOUS
  Filled 2020-05-19: qty 21

## 2020-05-19 MED ORDER — FAMOTIDINE 20 MG IN NS 100 ML IVPB
INTRAVENOUS | Status: AC
  Filled 2020-05-19: qty 100

## 2020-05-19 MED ORDER — DIPHENHYDRAMINE HCL 50 MG/ML IJ SOLN
INTRAMUSCULAR | Status: AC
  Filled 2020-05-19: qty 1

## 2020-05-19 MED FILL — Prochlorperazine Maleate Tab 10 MG (Base Equivalent): ORAL | 7 days supply | Qty: 30 | Fill #0 | Status: AC

## 2020-05-19 NOTE — Assessment & Plan Note (Signed)
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  Treatment plan: 1. Neo- adjuvant chemotherapy with dose dense Adriamycin and Cytoxan/Keytrudafollowed by Taxol and carboplatin 2.bilateral mastectomieswith sentinel lymph node biopsy 3. Adjuvant radiation 4. followed by adjuvant antiestrogen therapy 5. Genetic testing _______________________________________________________________________ Current Treatment: Completed 4 cycles of neoadjuvant chemotherapy with Adriamycin/Cytoxan/Keytruda, today cycle 2 Taxol (carboplatin and Keytruda every 3 weeks) Echocardiogram 01/31/2020 EF 60-65%  Chemo toxicities: 1.Alopecia 2.fatigue: Stable 3.Chemotherapy-induced anemia: Hemoglobin 11.1: Monitoring  Patient is still working part-time. COVID infection: Received monoclonal antibody treatments.  This delayed her treatment slightly.   Return to clinic weekly for Taxol treatments and carboplatin and Keytruda every 3 weeks.

## 2020-05-20 ENCOUNTER — Other Ambulatory Visit (HOSPITAL_COMMUNITY): Payer: Self-pay

## 2020-05-20 MED ORDER — ESCITALOPRAM OXALATE 10 MG PO TABS
10.0000 mg | ORAL_TABLET | Freq: Every day | ORAL | 3 refills | Status: DC
  Filled 2020-05-20: qty 90, 90d supply, fill #0
  Filled 2020-08-14: qty 90, 90d supply, fill #1
  Filled 2020-11-16: qty 90, 90d supply, fill #2
  Filled 2021-02-14: qty 90, 90d supply, fill #3

## 2020-05-20 NOTE — Addendum Note (Signed)
Addended by: Maryla Morrow on: 05/20/2020 02:54 PM   Modules accepted: Orders

## 2020-05-25 MED FILL — Dexamethasone Sodium Phosphate Inj 100 MG/10ML: INTRAMUSCULAR | Qty: 1 | Status: AC

## 2020-05-26 ENCOUNTER — Other Ambulatory Visit: Payer: Self-pay

## 2020-05-26 ENCOUNTER — Other Ambulatory Visit: Payer: 59

## 2020-05-26 ENCOUNTER — Inpatient Hospital Stay: Payer: 59

## 2020-05-26 VITALS — BP 110/73 | HR 73 | Temp 98.8°F | Resp 16 | Wt 121.8 lb

## 2020-05-26 DIAGNOSIS — R5383 Other fatigue: Secondary | ICD-10-CM | POA: Diagnosis not present

## 2020-05-26 DIAGNOSIS — Z170 Estrogen receptor positive status [ER+]: Secondary | ICD-10-CM

## 2020-05-26 DIAGNOSIS — D6481 Anemia due to antineoplastic chemotherapy: Secondary | ICD-10-CM | POA: Diagnosis not present

## 2020-05-26 DIAGNOSIS — Z95828 Presence of other vascular implants and grafts: Secondary | ICD-10-CM

## 2020-05-26 DIAGNOSIS — C50411 Malignant neoplasm of upper-outer quadrant of right female breast: Secondary | ICD-10-CM

## 2020-05-26 DIAGNOSIS — D6959 Other secondary thrombocytopenia: Secondary | ICD-10-CM | POA: Diagnosis not present

## 2020-05-26 DIAGNOSIS — Z5112 Encounter for antineoplastic immunotherapy: Secondary | ICD-10-CM | POA: Diagnosis not present

## 2020-05-26 DIAGNOSIS — D702 Other drug-induced agranulocytosis: Secondary | ICD-10-CM | POA: Diagnosis not present

## 2020-05-26 DIAGNOSIS — Z5111 Encounter for antineoplastic chemotherapy: Secondary | ICD-10-CM | POA: Diagnosis not present

## 2020-05-26 DIAGNOSIS — L658 Other specified nonscarring hair loss: Secondary | ICD-10-CM | POA: Diagnosis not present

## 2020-05-26 LAB — CMP (CANCER CENTER ONLY)
ALT: 38 U/L (ref 0–44)
AST: 37 U/L (ref 15–41)
Albumin: 4 g/dL (ref 3.5–5.0)
Alkaline Phosphatase: 54 U/L (ref 38–126)
Anion gap: 7 (ref 5–15)
BUN: 12 mg/dL (ref 6–20)
CO2: 28 mmol/L (ref 22–32)
Calcium: 9 mg/dL (ref 8.9–10.3)
Chloride: 104 mmol/L (ref 98–111)
Creatinine: 0.63 mg/dL (ref 0.44–1.00)
GFR, Estimated: >60 mL/min (ref >60)
Glucose, Bld: 95 mg/dL (ref 70–99)
Potassium: 3.9 mmol/L (ref 3.5–5.1)
Sodium: 139 mmol/L (ref 135–145)
Total Bilirubin: 0.3 mg/dL (ref 0.3–1.2)
Total Protein: 7.1 g/dL (ref 6.5–8.1)

## 2020-05-26 LAB — CBC WITH DIFFERENTIAL (CANCER CENTER ONLY)
Abs Immature Granulocytes: 0 10*3/uL (ref 0.00–0.07)
Basophils Absolute: 0 10*3/uL (ref 0.0–0.1)
Basophils Relative: 1 %
Eosinophils Absolute: 0 10*3/uL (ref 0.0–0.5)
Eosinophils Relative: 1 %
HCT: 27 % — ABNORMAL LOW (ref 36.0–46.0)
Hemoglobin: 9.1 g/dL — ABNORMAL LOW (ref 12.0–15.0)
Immature Granulocytes: 0 %
Lymphocytes Relative: 38 %
Lymphs Abs: 0.8 10*3/uL (ref 0.7–4.0)
MCH: 31.7 pg (ref 26.0–34.0)
MCHC: 33.7 g/dL (ref 30.0–36.0)
MCV: 94.1 fL (ref 80.0–100.0)
Monocytes Absolute: 0.3 10*3/uL (ref 0.1–1.0)
Monocytes Relative: 13 %
Neutro Abs: 1 10*3/uL — ABNORMAL LOW (ref 1.7–7.7)
Neutrophils Relative %: 47 %
Platelet Count: 59 10*3/uL — ABNORMAL LOW (ref 150–400)
RBC: 2.87 MIL/uL — ABNORMAL LOW (ref 3.87–5.11)
RDW: 13.1 % (ref 11.5–15.5)
WBC Count: 2.1 10*3/uL — ABNORMAL LOW (ref 4.0–10.5)
nRBC: 0 % (ref 0.0–0.2)

## 2020-05-26 LAB — PREGNANCY, URINE: Preg Test, Ur: NEGATIVE

## 2020-05-26 LAB — TSH: TSH: 0.527 u[IU]/mL (ref 0.308–3.960)

## 2020-05-26 MED ORDER — SODIUM CHLORIDE 0.9% FLUSH
10.0000 mL | Freq: Once | INTRAVENOUS | Status: AC
  Administered 2020-05-26: 10 mL
  Filled 2020-05-26: qty 10

## 2020-05-26 MED ORDER — HEPARIN SOD (PORK) LOCK FLUSH 100 UNIT/ML IV SOLN
500.0000 [IU] | Freq: Once | INTRAVENOUS | Status: AC
Start: 2020-05-26 — End: 2020-05-26
  Administered 2020-05-26: 500 [IU]
  Filled 2020-05-26: qty 5

## 2020-05-26 NOTE — Progress Notes (Signed)
Per Dr. Lindi Adie, no treatment today due to low PLT and ANC 1.0.  Educated patient on good hand hygiene.

## 2020-05-27 DIAGNOSIS — Z170 Estrogen receptor positive status [ER+]: Secondary | ICD-10-CM | POA: Diagnosis not present

## 2020-05-27 DIAGNOSIS — Z1509 Genetic susceptibility to other malignant neoplasm: Secondary | ICD-10-CM | POA: Diagnosis not present

## 2020-05-27 DIAGNOSIS — C50411 Malignant neoplasm of upper-outer quadrant of right female breast: Secondary | ICD-10-CM | POA: Diagnosis not present

## 2020-05-27 DIAGNOSIS — Z1501 Genetic susceptibility to malignant neoplasm of breast: Secondary | ICD-10-CM | POA: Diagnosis not present

## 2020-05-28 DIAGNOSIS — Z1502 Genetic susceptibility to malignant neoplasm of ovary: Secondary | ICD-10-CM | POA: Diagnosis not present

## 2020-05-28 DIAGNOSIS — Z1501 Genetic susceptibility to malignant neoplasm of breast: Secondary | ICD-10-CM | POA: Diagnosis not present

## 2020-05-28 DIAGNOSIS — Z1509 Genetic susceptibility to other malignant neoplasm: Secondary | ICD-10-CM | POA: Diagnosis not present

## 2020-05-28 DIAGNOSIS — Z853 Personal history of malignant neoplasm of breast: Secondary | ICD-10-CM | POA: Diagnosis not present

## 2020-06-01 MED FILL — Dexamethasone Sodium Phosphate Inj 100 MG/10ML: INTRAMUSCULAR | Qty: 1 | Status: AC

## 2020-06-01 NOTE — Progress Notes (Signed)
Patient Care Team: Emeterio Reeve, DO as PCP - General (Osteopathic Medicine) [name], MD as Consulting Physician (Obstetrics and Gynecology) Mauro Kaufmann, RN as Oncology Nurse Navigator Rockwell Germany, RN as Oncology Nurse Navigator  DIAGNOSIS:    ICD-10-CM   1. Malignant neoplasm of upper-outer quadrant of right breast in female, estrogen receptor positive ([place])  C50.411    Z17.0     SUMMARY OF ONCOLOGIC HISTORY: Oncology History  Malignant neoplasm of upper outer quadrant of female breast ([place])  01/28/2020 Initial Diagnosis   Patient palpated a right breast lump. Diagnostic mammogram and US showed calcifications spanning 2.0cm in the right breast. Biopsy showed invasive and in situ carcinoma, grade 2. 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 Testing was completed through the CustomNext-Cancer + RNAinsight panel offered by [facility]. 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 The report date is 02/13/2020.  The CustomNext-Cancer+RNAinsight panel offered by [facility] includes sequencing and rearrangement analysis for the following 47 genes:  APC, ATM, AXIN2, BARD1, BMPR1A, BRCA1, BRCA2, BRIP1, CDH1, CDK4, CDKN2A, CHEK2, DICER1, EPCAM, GREM1, HOXB13, MEN1, MLH1, MSH2, MSH3, MSH6, MUTYH, NBN, NF1, NF2, NTHL1, PALB2, PMS2, POLD1, POLE, PTEN, RAD51C, RAD51D, RECQL, RET, SDHA, SDHAF2, SDHB, SDHC,  SDHD, SMAD4, SMARCA4, STK11, TP53, TSC1, TSC2, and VHL.  RNA data is routinely analyzed for use in variant interpretation for all genes.     CHIEF COMPLIANT: Cycle3 Taxol (Keytruda andCarboplatin every 3 weeks)  INTERVAL HISTORY: Sydney Herman is a 40 y.o. with above-mentioned history of right cancer currently on neoadjuvant chemotherapy withTaxol, Keytruda, andCarboplatinafter completing 4 cycles ofAdriamycin/Cytoxan/Pembrolizumab.She presents to the clinic todayfora toxicity check and cycle 3.    She had tolerated the treatment fairly well without any clinical side effects but her cytopenias have led to holding treatment last week.  She is here today for third round of treatment.  Does not have any nausea issues.  Energy levels are excellent.  ALLERGIES:  is allergic to amoxil [amoxicillin].  MEDICATIONS:  Current Outpatient Medications  Medication Sig Dispense Refill  . cycloSPORINE (RESTASIS) 0.05 % ophthalmic emulsion INSILL 1 DROP INTO BOTH EYES TWICE DAILY 180 mL 4  . doxycycline (VIBRA-TABS) 100 MG tablet TAKE 1 TABLET BY MOUTH 2 TIMES DAILY FOR 7 DAYS 14 tablet 0  . escitalopram (LEXAPRO) 10 MG tablet Take 1 tablet (10 mg total) by mouth daily. 90 tablet 3  . lidocaine-prilocaine (EMLA) cream APPLY TO AFFECTED AREA ONCE 30 g 3  . loratadine (CLARITIN) 10 MG tablet 10 mg.    . LORazepam (ATIVAN) 0.5 MG tablet TAKE 1 TABLET BY MOUTH AT BEDTIME AS NEEDED FOR NAUSEA OR VOMITING 30 tablet 0  . ondansetron (ZOFRAN) 8 MG tablet TAKE 1 TABLET BY MOUTH 2 TIMES DAILY AS NEEDED START ON THE THIRD DAY AFTER CHEMO 30 tablet 1  . prochlorperazine (COMPAZINE) 10 MG tablet TAKE 1 TABLET BY MOUTH  EVERY 6 HOURS AS NEEDED FOR NAUSEA OR VOMITING 30 tablet 1  . RESTASIS 0.05 % ophthalmic emulsion Place 1 drop into the right eye 2 (two) times daily.     No current facility-administered medications for this visit.    PHYSICAL EXAMINATION: ECOG PERFORMANCE STATUS: 1 - Symptomatic but completely  ambulatory  Vitals:   06/02/20 0815  BP: 115/81  Pulse: 75  Resp: 18  Temp: 97.9 F (36.6 C)  SpO2: 100%   Filed Weights   06/02/20 0815  Weight: 122 lb 4.8 oz (55.5 kg)    LABORATORY DATA:  I have reviewed the data as listed CMP Latest Ref Rng & Units 06/02/2020 05/26/2020 05/19/2020  Glucose 70 - 99 mg/dL 99 95 97  BUN 6 - 20 mg/dL _0 Creatinine 0.44 - 1.00 mg/dL 0.70 0.63 0.68  Sodium 135 - 145 mmol/L 137 139 139  Potassium 3.5 - 5.1 mmol/L 3.7 3.9 3.8  Chloride 98 - 111 mmol/L 101 104 101  CO2 22 - 32 mmol/L _1 Calcium 8.9 - 10.3 mg/dL 9.3 9.0 9.5  Total Protein 6.5 - 8.1 g/dL 7.4 7.1 7.6  Total Bilirubin 0.3 - 1.2 mg/dL 0.4 0.3 0.6  Alkaline Phos 38 - 126 U/L 53 54 54  AST 15 - 41 U/L 30 37 33  ALT 0 - 44 U/L 32 38 29    Lab Results  Component Value Date   WBC 2.0 (L) 06/02/2020   HGB 9.7 (L) 06/02/2020   HCT 28.7 (L) 06/02/2020   MCV 94.1 06/02/2020   PLT 72 (L) 06/02/2020   NEUTROABS 0.8 (L) 06/02/2020    ASSESSMENT & PLAN:  Malignant neoplasm of upper outer quadrant of female breast ([place]) 01/28/2020: Palpable right breast lump: Mammogram and ultrasound revealed calcifications spanning 2 cm, biopsy revealed IDC with DCIS, grade 2, 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  Treatment plan: 1. Neo- adjuvant chemotherapy with dose dense Adriamycin and Cytoxan/Keytrudafollowed by Taxol and carboplatin 2.bilateral mastectomieswith sentinel lymph node biopsy 3. Adjuvant radiation 4. followed by adjuvant antiestrogen therapy 5. Genetic testing _______________________________________________________________________ Current Treatment: Completed 4 cycles of neoadjuvant chemotherapy with Adriamycin/Cytoxan/Keytruda, today cycle 3 Taxol (carboplatin and Keytruda every  3 weeks) last week chemo was held for cytopenias Echocardiogram 01/31/2020 EF 60-65%  Chemo toxicities: 1.Alopecia 2.fatigue: Stable 3.Chemotherapy-induced anemia: Hemoglobin 10.4: Monitoring 4.  Thrombocytopenia: Platelet count 59 on 05/26/2020,  Treatment was held (cycle 3): This is most likely related to carboplatin.  We will plan to reduce the dosage of carboplatin for the next cycle.  We will also slightly reduce the dosage of Taxol given the moderate neutropenia. 5.  Leukopenia/neutropenia: We will administer a dose of Granix on Saturday prior to each of her chemotherapy treatments. Because her [place] today is 0.8, we decided to hold today's treatment.  For this week we will administer Granix injections on Friday and Saturday.  Monitoring closely for neuropathy.  Return to clinic weekly for chemo.   No orders of the defined types were placed in this encounter.  The patient has a good understanding of the overall plan. she agrees with it. she will call with any problems that may develop before the next visit here.  Total time spent: 30 mins including face to face time and time spent for planning, charting and coordination of care  Rulon Eisenmenger, MD, MPH 06/02/2020  I, Molly Dorshimer, am acting as scribe for Dr. Nicholas Lose.  I have reviewed the above documentation for accuracy and completeness, and I agree with the above.

## 2020-06-02 ENCOUNTER — Other Ambulatory Visit: Payer: Self-pay

## 2020-06-02 ENCOUNTER — Inpatient Hospital Stay: Payer: 59

## 2020-06-02 ENCOUNTER — Telehealth: Payer: Self-pay | Admitting: Hematology and Oncology

## 2020-06-02 ENCOUNTER — Inpatient Hospital Stay (HOSPITAL_BASED_OUTPATIENT_CLINIC_OR_DEPARTMENT_OTHER): Payer: 59 | Admitting: Hematology and Oncology

## 2020-06-02 ENCOUNTER — Other Ambulatory Visit: Payer: 59

## 2020-06-02 DIAGNOSIS — D702 Other drug-induced agranulocytosis: Secondary | ICD-10-CM | POA: Diagnosis not present

## 2020-06-02 DIAGNOSIS — D6481 Anemia due to antineoplastic chemotherapy: Secondary | ICD-10-CM | POA: Diagnosis not present

## 2020-06-02 DIAGNOSIS — Z170 Estrogen receptor positive status [ER+]: Secondary | ICD-10-CM | POA: Diagnosis not present

## 2020-06-02 DIAGNOSIS — Z5111 Encounter for antineoplastic chemotherapy: Secondary | ICD-10-CM | POA: Diagnosis not present

## 2020-06-02 DIAGNOSIS — C50411 Malignant neoplasm of upper-outer quadrant of right female breast: Secondary | ICD-10-CM

## 2020-06-02 DIAGNOSIS — Z5112 Encounter for antineoplastic immunotherapy: Secondary | ICD-10-CM | POA: Diagnosis not present

## 2020-06-02 DIAGNOSIS — L658 Other specified nonscarring hair loss: Secondary | ICD-10-CM | POA: Diagnosis not present

## 2020-06-02 DIAGNOSIS — Z95828 Presence of other vascular implants and grafts: Secondary | ICD-10-CM

## 2020-06-02 DIAGNOSIS — R5383 Other fatigue: Secondary | ICD-10-CM | POA: Diagnosis not present

## 2020-06-02 DIAGNOSIS — D6959 Other secondary thrombocytopenia: Secondary | ICD-10-CM | POA: Diagnosis not present

## 2020-06-02 LAB — CBC WITH DIFFERENTIAL (CANCER CENTER ONLY)
Abs Immature Granulocytes: 0 10*3/uL (ref 0.00–0.07)
Basophils Absolute: 0 10*3/uL (ref 0.0–0.1)
Basophils Relative: 1 %
Eosinophils Absolute: 0 10*3/uL (ref 0.0–0.5)
Eosinophils Relative: 1 %
HCT: 28.7 % — ABNORMAL LOW (ref 36.0–46.0)
Hemoglobin: 9.7 g/dL — ABNORMAL LOW (ref 12.0–15.0)
Immature Granulocytes: 0 %
Lymphocytes Relative: 43 %
Lymphs Abs: 0.9 10*3/uL (ref 0.7–4.0)
MCH: 31.8 pg (ref 26.0–34.0)
MCHC: 33.8 g/dL (ref 30.0–36.0)
MCV: 94.1 fL (ref 80.0–100.0)
Monocytes Absolute: 0.3 10*3/uL (ref 0.1–1.0)
Monocytes Relative: 15 %
Neutro Abs: 0.8 10*3/uL — ABNORMAL LOW (ref 1.7–7.7)
Neutrophils Relative %: 40 %
Platelet Count: 72 10*3/uL — ABNORMAL LOW (ref 150–400)
RBC: 3.05 MIL/uL — ABNORMAL LOW (ref 3.87–5.11)
RDW: 13.2 % (ref 11.5–15.5)
WBC Count: 2 10*3/uL — ABNORMAL LOW (ref 4.0–10.5)
nRBC: 0 % (ref 0.0–0.2)

## 2020-06-02 LAB — CMP (CANCER CENTER ONLY)
ALT: 32 U/L (ref 0–44)
AST: 30 U/L (ref 15–41)
Albumin: 4.1 g/dL (ref 3.5–5.0)
Alkaline Phosphatase: 53 U/L (ref 38–126)
Anion gap: 8 (ref 5–15)
BUN: 12 mg/dL (ref 6–20)
CO2: 28 mmol/L (ref 22–32)
Calcium: 9.3 mg/dL (ref 8.9–10.3)
Chloride: 101 mmol/L (ref 98–111)
Creatinine: 0.7 mg/dL (ref 0.44–1.00)
GFR, Estimated: >60 mL/min (ref >60)
Glucose, Bld: 99 mg/dL (ref 70–99)
Potassium: 3.7 mmol/L (ref 3.5–5.1)
Sodium: 137 mmol/L (ref 135–145)
Total Bilirubin: 0.4 mg/dL (ref 0.3–1.2)
Total Protein: 7.4 g/dL (ref 6.5–8.1)

## 2020-06-02 LAB — TSH: TSH: 0.881 u[IU]/mL (ref 0.308–3.960)

## 2020-06-02 LAB — PREGNANCY, URINE: Preg Test, Ur: NEGATIVE

## 2020-06-02 MED ORDER — SODIUM CHLORIDE 0.9% FLUSH
10.0000 mL | Freq: Once | INTRAVENOUS | Status: AC
Start: 2020-06-02 — End: 2020-06-02
  Administered 2020-06-02: 10 mL
  Filled 2020-06-02: qty 10

## 2020-06-02 NOTE — Progress Notes (Signed)
The following biosimilar Zarxio ([id number]) has been selected for use in this patient required by insurance.  Henreitta Leber, PharmD

## 2020-06-02 NOTE — Assessment & Plan Note (Signed)
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  Treatment plan: 1. Neo- adjuvant chemotherapy with dose dense Adriamycin and Cytoxan/Keytrudafollowed by Taxol and carboplatin 2.bilateral mastectomieswith sentinel lymph node biopsy 3. Adjuvant radiation 4. followed by adjuvant antiestrogen therapy 5. Genetic testing _______________________________________________________________________ Current Treatment: Completed 4 cycles of neoadjuvant chemotherapy with Adriamycin/Cytoxan/Keytruda, today cycle 2 Taxol (carboplatin and Keytruda every 3 weeks) Echocardiogram 01/31/2020 EF 60-65%  Chemo toxicities: 1.Alopecia 2.fatigue: Stable 3.Chemotherapy-induced anemia: Hemoglobin 10.4: Monitoring 4.  Thrombocytopenia: Platelet count 59 on 05/26/2020, ANC 1: Treatment was held (cycle 3): This is most likely related to carboplatin.  We will plan to reduce the dosage of carboplatin for the next cycle.  We will also slightly reduce the dosage of Taxol given the moderate neutropenia.  Monitoring closely for neuropathy.

## 2020-06-02 NOTE — Telephone Encounter (Signed)
Scheduled appts per 5/24 los. Called pt about injection appt for Monday and 6/6. Let pt know to come in Friday and Saturday for her injections per nurse. And then no appts were needed on 6/6 per MD. Left call back number in case pt had any further questions.

## 2020-06-05 ENCOUNTER — Other Ambulatory Visit: Payer: Self-pay

## 2020-06-05 ENCOUNTER — Inpatient Hospital Stay: Payer: 59

## 2020-06-05 VITALS — BP 120/76 | HR 69 | Temp 98.6°F | Resp 18

## 2020-06-05 DIAGNOSIS — R5383 Other fatigue: Secondary | ICD-10-CM | POA: Diagnosis not present

## 2020-06-05 DIAGNOSIS — L658 Other specified nonscarring hair loss: Secondary | ICD-10-CM | POA: Diagnosis not present

## 2020-06-05 DIAGNOSIS — D702 Other drug-induced agranulocytosis: Secondary | ICD-10-CM | POA: Diagnosis not present

## 2020-06-05 DIAGNOSIS — C50411 Malignant neoplasm of upper-outer quadrant of right female breast: Secondary | ICD-10-CM

## 2020-06-05 DIAGNOSIS — D6481 Anemia due to antineoplastic chemotherapy: Secondary | ICD-10-CM | POA: Diagnosis not present

## 2020-06-05 DIAGNOSIS — Z95828 Presence of other vascular implants and grafts: Secondary | ICD-10-CM

## 2020-06-05 DIAGNOSIS — Z5112 Encounter for antineoplastic immunotherapy: Secondary | ICD-10-CM | POA: Diagnosis not present

## 2020-06-05 DIAGNOSIS — D6959 Other secondary thrombocytopenia: Secondary | ICD-10-CM | POA: Diagnosis not present

## 2020-06-05 DIAGNOSIS — Z170 Estrogen receptor positive status [ER+]: Secondary | ICD-10-CM

## 2020-06-05 DIAGNOSIS — Z5111 Encounter for antineoplastic chemotherapy: Secondary | ICD-10-CM | POA: Diagnosis not present

## 2020-06-05 MED ORDER — FILGRASTIM-SNDZ 300 MCG/0.5ML IJ SOSY
300.0000 ug | PREFILLED_SYRINGE | Freq: Once | INTRAMUSCULAR | Status: AC
  Administered 2020-06-05: 300 ug via SUBCUTANEOUS

## 2020-06-05 MED ORDER — FILGRASTIM-SNDZ 300 MCG/0.5ML IJ SOSY
PREFILLED_SYRINGE | INTRAMUSCULAR | Status: AC
  Filled 2020-06-05: qty 0.5

## 2020-06-05 NOTE — Patient Instructions (Signed)

## 2020-06-06 ENCOUNTER — Other Ambulatory Visit: Payer: Self-pay

## 2020-06-06 ENCOUNTER — Inpatient Hospital Stay: Payer: 59

## 2020-06-06 VITALS — BP 105/77 | HR 78 | Temp 98.5°F | Resp 17

## 2020-06-06 DIAGNOSIS — Z170 Estrogen receptor positive status [ER+]: Secondary | ICD-10-CM

## 2020-06-06 DIAGNOSIS — C50411 Malignant neoplasm of upper-outer quadrant of right female breast: Secondary | ICD-10-CM | POA: Diagnosis not present

## 2020-06-06 DIAGNOSIS — Z5112 Encounter for antineoplastic immunotherapy: Secondary | ICD-10-CM | POA: Diagnosis not present

## 2020-06-06 DIAGNOSIS — Z95828 Presence of other vascular implants and grafts: Secondary | ICD-10-CM

## 2020-06-06 DIAGNOSIS — L658 Other specified nonscarring hair loss: Secondary | ICD-10-CM | POA: Diagnosis not present

## 2020-06-06 DIAGNOSIS — D6959 Other secondary thrombocytopenia: Secondary | ICD-10-CM | POA: Diagnosis not present

## 2020-06-06 DIAGNOSIS — D702 Other drug-induced agranulocytosis: Secondary | ICD-10-CM | POA: Diagnosis not present

## 2020-06-06 DIAGNOSIS — D6481 Anemia due to antineoplastic chemotherapy: Secondary | ICD-10-CM | POA: Diagnosis not present

## 2020-06-06 DIAGNOSIS — R5383 Other fatigue: Secondary | ICD-10-CM | POA: Diagnosis not present

## 2020-06-06 DIAGNOSIS — Z5111 Encounter for antineoplastic chemotherapy: Secondary | ICD-10-CM | POA: Diagnosis not present

## 2020-06-06 MED ORDER — FILGRASTIM-SNDZ 300 MCG/0.5ML IJ SOSY
PREFILLED_SYRINGE | INTRAMUSCULAR | Status: AC
  Filled 2020-06-06: qty 0.5

## 2020-06-06 MED ORDER — FILGRASTIM-SNDZ 300 MCG/0.5ML IJ SOSY
300.0000 ug | PREFILLED_SYRINGE | Freq: Once | INTRAMUSCULAR | Status: AC
  Administered 2020-06-06: 300 ug via SUBCUTANEOUS

## 2020-06-08 NOTE — Progress Notes (Signed)
Patient Care Team: Emeterio Reeve, DO as PCP - General (Osteopathic Medicine) [name], MD as Consulting Physician (Obstetrics and Gynecology) Mauro Kaufmann, RN as Oncology Nurse Navigator Rockwell Germany, RN as Oncology Nurse Navigator  DIAGNOSIS:    ICD-10-CM   1. Malignant neoplasm of upper-outer quadrant of right breast in female, estrogen receptor positive ([place])  C50.411    Z17.0     SUMMARY OF ONCOLOGIC HISTORY: Oncology History  Malignant neoplasm of upper outer quadrant of female breast ([place])  01/28/2020 Initial Diagnosis   Patient palpated a right breast lump. Diagnostic mammogram and US showed calcifications spanning 2.0cm in the right breast. Biopsy showed invasive and in situ carcinoma, grade 2. 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 Testing was completed through the CustomNext-Cancer + RNAinsight panel offered by [facility]. 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 The report date is 02/13/2020.  The CustomNext-Cancer+RNAinsight panel offered by [facility] includes sequencing and rearrangement analysis for the following 47 genes:  APC, ATM, AXIN2, BARD1, BMPR1A, BRCA1, BRCA2, BRIP1, CDH1, CDK4, CDKN2A, CHEK2, DICER1, EPCAM, GREM1, HOXB13, MEN1, MLH1, MSH2, MSH3, MSH6, MUTYH, NBN, NF1, NF2, NTHL1, PALB2, PMS2, POLD1, POLE, PTEN, RAD51C, RAD51D, RECQL, RET, SDHA, SDHAF2, SDHB, SDHC,  SDHD, SMAD4, SMARCA4, STK11, TP53, TSC1, TSC2, and VHL.  RNA data is routinely analyzed for use in variant interpretation for all genes.     CHIEF COMPLIANT: Cycle3Taxol (Keytruda andCarboplatinevery 3 weeks)  INTERVAL HISTORY: Sydney Herman is a 40 y.o. with above-mentioned history of right cancer currentlyon neoadjuvant chemotherapy withTaxol, Keytruda,andCarboplatinafter completing 4 cycles ofAdriamycin/Cytoxan/Pembrolizumab.Cycle 3 was held last week due to neutropenia. She presents to the clinic todayfora toxicity check and cycle 3.   ALLERGIES:  is allergic to amoxil [amoxicillin].  MEDICATIONS:  Current Outpatient Medications  Medication Sig Dispense Refill  . cycloSPORINE (RESTASIS) 0.05 % ophthalmic emulsion INSILL 1 DROP INTO BOTH EYES TWICE DAILY 180 mL 4  . doxycycline (VIBRA-TABS) 100 MG tablet TAKE 1 TABLET BY MOUTH 2 TIMES DAILY FOR 7 DAYS 14 tablet 0  . escitalopram (LEXAPRO) 10 MG tablet Take 1 tablet (10 mg total) by mouth daily. 90 tablet 3  . lidocaine-prilocaine (EMLA) cream APPLY TO AFFECTED AREA ONCE 30 g 3  . loratadine (CLARITIN) 10 MG tablet 10 mg.    . LORazepam (ATIVAN) 0.5 MG tablet TAKE 1 TABLET BY MOUTH AT BEDTIME AS NEEDED FOR NAUSEA OR VOMITING 30 tablet 0  . ondansetron (ZOFRAN) 8 MG tablet TAKE 1 TABLET BY MOUTH 2 TIMES DAILY AS NEEDED START ON THE THIRD DAY AFTER CHEMO 30 tablet 1  . prochlorperazine (COMPAZINE) 10 MG tablet TAKE 1 TABLET BY MOUTH EVERY 6 HOURS AS NEEDED FOR NAUSEA OR VOMITING 30 tablet 1  . RESTASIS 0.05 % ophthalmic emulsion Place 1 drop into the right eye 2 (two) times daily.     No current facility-administered medications for this visit.  PHYSICAL EXAMINATION: ECOG PERFORMANCE STATUS: 1 - Symptomatic but completely ambulatory  Vitals:   06/09/20 0843  BP: 108/72  Pulse: 74  Resp: 18  Temp: 97.6 F (36.4 C)  SpO2: 100%   Filed Weights   06/09/20 0843  Weight: 122 lb (55.3 kg)    LABORATORY DATA:  I  have reviewed the data as listed CMP Latest Ref Rng & Units 06/02/2020 05/26/2020 05/19/2020  Glucose 70 - 99 mg/dL 99 95 97  BUN 6 - 20 mg/dL '12 12 13  ' Creatinine 0.44 - 1.00 mg/dL 0.70 0.63 0.68  Sodium 135 - 145 mmol/L 137 139 139  Potassium 3.5 - 5.1 mmol/L 3.7 3.9 3.8  Chloride 98 - 111 mmol/L 101 104 101  CO2 22 - 32 mmol/L '28 28 29  ' Calcium 8.9 - 10.3 mg/dL 9.3 9.0 9.5  Total Protein 6.5 - 8.1 g/dL 7.4 7.1 7.6  Total Bilirubin 0.3 - 1.2 mg/dL 0.4 0.3 0.6  Alkaline Phos 38 - 126 U/L 53 54 54  AST 15 - 41 U/L 30 37 33  ALT 0 - 44 U/L 32 38 29    Lab Results  Component Value Date   WBC 4.3 06/09/2020   HGB 10.3 (L) 06/09/2020   HCT 30.2 (L) 06/09/2020   MCV 94.4 06/09/2020   PLT 177 06/09/2020   NEUTROABS 2.7 06/09/2020    ASSESSMENT & PLAN:  Malignant neoplasm of upper outer quadrant of female breast ([place]) 01/28/2020: Palpable right breast lump: Mammogram and ultrasound revealed calcifications spanning 2 cm, biopsy revealed IDC with DCIS, grade 2, 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  Treatment plan: 1. Neo- adjuvant chemotherapy with dose dense Adriamycin and Cytoxan/Keytrudafollowed by Taxol and carboplatin 2.bilateral mastectomieswith sentinel lymph node biopsy 3. Adjuvant radiation 4. followed by adjuvant antiestrogen therapy 5. Genetic testing _______________________________________________________________________ Current Treatment:Completed 4 cycles of neoadjuvant chemotherapy with Adriamycin/Cytoxan/Keytruda,today cycle 3 Taxol (carboplatin and Keytruda every 3 weeks)   Chemo toxicities: 1.Alopecia 2.fatigue: Stable 3.Chemotherapy-induced anemia: Hemoglobin10.4: Monitoring 4.  Thrombocytopenia: Platelet count 59 on 05/26/2020,  Treatment was held (cycle 3): This  is most likely related to carboplatin.  He has prescription today but ANC is 2.7 and platelets are 177 5.  Leukopenia/neutropenia: We are administering Granix injections prior to each chemo.  She will get 2 injections prior to each treatment on Friday and Saturday.   No orders of the defined types were placed in this encounter.  The patient has a good understanding of the overall plan. she agrees with it. she will call with any problems that may develop before the next visit here.  Total time spent: 30 mins including face to face time and time spent for planning, charting and coordination of care  Rulon Eisenmenger, MD, MPH 06/09/2020  I, Cloyde Reams Dorshimer, am acting as scribe for Dr. Nicholas Lose.  I have reviewed the above documentation for accuracy and completeness, and I agree with the above.

## 2020-06-09 ENCOUNTER — Inpatient Hospital Stay: Payer: 59

## 2020-06-09 ENCOUNTER — Other Ambulatory Visit: Payer: 59

## 2020-06-09 ENCOUNTER — Inpatient Hospital Stay (HOSPITAL_BASED_OUTPATIENT_CLINIC_OR_DEPARTMENT_OTHER): Payer: 59 | Admitting: Hematology and Oncology

## 2020-06-09 ENCOUNTER — Other Ambulatory Visit: Payer: Self-pay | Admitting: Hematology and Oncology

## 2020-06-09 ENCOUNTER — Other Ambulatory Visit: Payer: Self-pay

## 2020-06-09 DIAGNOSIS — Z170 Estrogen receptor positive status [ER+]: Secondary | ICD-10-CM | POA: Diagnosis not present

## 2020-06-09 DIAGNOSIS — C50411 Malignant neoplasm of upper-outer quadrant of right female breast: Secondary | ICD-10-CM | POA: Diagnosis not present

## 2020-06-09 DIAGNOSIS — D6481 Anemia due to antineoplastic chemotherapy: Secondary | ICD-10-CM | POA: Diagnosis not present

## 2020-06-09 DIAGNOSIS — Z5111 Encounter for antineoplastic chemotherapy: Secondary | ICD-10-CM | POA: Diagnosis not present

## 2020-06-09 DIAGNOSIS — D6959 Other secondary thrombocytopenia: Secondary | ICD-10-CM | POA: Diagnosis not present

## 2020-06-09 DIAGNOSIS — R5383 Other fatigue: Secondary | ICD-10-CM | POA: Diagnosis not present

## 2020-06-09 DIAGNOSIS — L658 Other specified nonscarring hair loss: Secondary | ICD-10-CM | POA: Diagnosis not present

## 2020-06-09 DIAGNOSIS — D702 Other drug-induced agranulocytosis: Secondary | ICD-10-CM | POA: Diagnosis not present

## 2020-06-09 DIAGNOSIS — Z95828 Presence of other vascular implants and grafts: Secondary | ICD-10-CM

## 2020-06-09 DIAGNOSIS — Z5112 Encounter for antineoplastic immunotherapy: Secondary | ICD-10-CM | POA: Diagnosis not present

## 2020-06-09 LAB — CBC WITH DIFFERENTIAL (CANCER CENTER ONLY)
Abs Immature Granulocytes: 0.02 10*3/uL (ref 0.00–0.07)
Basophils Absolute: 0 10*3/uL (ref 0.0–0.1)
Basophils Relative: 1 %
Eosinophils Absolute: 0 10*3/uL (ref 0.0–0.5)
Eosinophils Relative: 1 %
HCT: 30.2 % — ABNORMAL LOW (ref 36.0–46.0)
Hemoglobin: 10.3 g/dL — ABNORMAL LOW (ref 12.0–15.0)
Immature Granulocytes: 1 %
Lymphocytes Relative: 25 %
Lymphs Abs: 1.1 10*3/uL (ref 0.7–4.0)
MCH: 32.2 pg (ref 26.0–34.0)
MCHC: 34.1 g/dL (ref 30.0–36.0)
MCV: 94.4 fL (ref 80.0–100.0)
Monocytes Absolute: 0.5 10*3/uL (ref 0.1–1.0)
Monocytes Relative: 12 %
Neutro Abs: 2.7 10*3/uL (ref 1.7–7.7)
Neutrophils Relative %: 60 %
Platelet Count: 177 10*3/uL (ref 150–400)
RBC: 3.2 MIL/uL — ABNORMAL LOW (ref 3.87–5.11)
RDW: 14.6 % (ref 11.5–15.5)
WBC Count: 4.3 10*3/uL (ref 4.0–10.5)
nRBC: 0 % (ref 0.0–0.2)

## 2020-06-09 LAB — PREGNANCY, URINE: Preg Test, Ur: NEGATIVE

## 2020-06-09 LAB — CMP (CANCER CENTER ONLY)
ALT: 22 U/L (ref 0–44)
AST: 27 U/L (ref 15–41)
Albumin: 4.1 g/dL (ref 3.5–5.0)
Alkaline Phosphatase: 73 U/L (ref 38–126)
Anion gap: 10 (ref 5–15)
BUN: 10 mg/dL (ref 6–20)
CO2: 27 mmol/L (ref 22–32)
Calcium: 9.4 mg/dL (ref 8.9–10.3)
Chloride: 103 mmol/L (ref 98–111)
Creatinine: 0.69 mg/dL (ref 0.44–1.00)
GFR, Estimated: >60 mL/min (ref >60)
Glucose, Bld: 86 mg/dL (ref 70–99)
Potassium: 3.8 mmol/L (ref 3.5–5.1)
Sodium: 140 mmol/L (ref 135–145)
Total Bilirubin: 0.2 mg/dL — ABNORMAL LOW (ref 0.3–1.2)
Total Protein: 7.4 g/dL (ref 6.5–8.1)

## 2020-06-09 LAB — TSH: TSH: 0.761 u[IU]/mL (ref 0.308–3.960)

## 2020-06-09 MED ORDER — SODIUM CHLORIDE 0.9 % IV SOLN
65.0000 mg/m2 | Freq: Once | INTRAVENOUS | Status: AC
  Administered 2020-06-09: 102 mg via INTRAVENOUS
  Filled 2020-06-09: qty 17

## 2020-06-09 MED ORDER — DIPHENHYDRAMINE HCL 50 MG/ML IJ SOLN
INTRAMUSCULAR | Status: AC
  Filled 2020-06-09: qty 1

## 2020-06-09 MED ORDER — HEPARIN SOD (PORK) LOCK FLUSH 100 UNIT/ML IV SOLN
500.0000 [IU] | Freq: Once | INTRAVENOUS | Status: AC | PRN
  Administered 2020-06-09: 500 [IU]
  Filled 2020-06-09: qty 5

## 2020-06-09 MED ORDER — SODIUM CHLORIDE 0.9 % IV SOLN
10.0000 mg | Freq: Once | INTRAVENOUS | Status: AC
  Administered 2020-06-09: 10 mg via INTRAVENOUS
  Filled 2020-06-09: qty 10

## 2020-06-09 MED ORDER — SODIUM CHLORIDE 0.9% FLUSH
10.0000 mL | INTRAVENOUS | Status: DC | PRN
  Administered 2020-06-09: 10 mL
  Filled 2020-06-09: qty 10

## 2020-06-09 MED ORDER — SODIUM CHLORIDE 0.9 % IV SOLN
Freq: Once | INTRAVENOUS | Status: AC
  Filled 2020-06-09: qty 250

## 2020-06-09 MED ORDER — FAMOTIDINE 20 MG IN NS 100 ML IVPB
20.0000 mg | Freq: Once | INTRAVENOUS | Status: AC
  Administered 2020-06-09: 20 mg via INTRAVENOUS

## 2020-06-09 MED ORDER — DIPHENHYDRAMINE HCL 50 MG/ML IJ SOLN
25.0000 mg | Freq: Once | INTRAMUSCULAR | Status: AC
  Administered 2020-06-09: 25 mg via INTRAVENOUS

## 2020-06-09 MED ORDER — FAMOTIDINE 20 MG IN NS 100 ML IVPB
INTRAVENOUS | Status: AC
  Filled 2020-06-09: qty 100

## 2020-06-09 MED ORDER — SODIUM CHLORIDE 0.9% FLUSH
10.0000 mL | Freq: Once | INTRAVENOUS | Status: AC
  Administered 2020-06-09: 10 mL
  Filled 2020-06-09: qty 10

## 2020-06-09 NOTE — Assessment & Plan Note (Signed)
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  Treatment plan: 1. Neo- adjuvant chemotherapy with dose dense Adriamycin and Cytoxan/Keytrudafollowed by Taxol and carboplatin 2.bilateral mastectomieswith sentinel lymph node biopsy 3. Adjuvant radiation 4. followed by adjuvant antiestrogen therapy 5. Genetic testing _______________________________________________________________________ Current Treatment:Completed 4 cycles of neoadjuvant chemotherapy with Adriamycin/Cytoxan/Keytruda,today cycle 3 Taxol (carboplatin and Keytruda every 3 weeks)   Chemo toxicities: 1.Alopecia 2.fatigue: Stable 3.Chemotherapy-induced anemia: Hemoglobin10.4: Monitoring 4.  Thrombocytopenia: Platelet count 59 on 05/26/2020,  Treatment was held (cycle 3): This is most likely related to carboplatin.   5.  Leukopenia/neutropenia: We are administering Granix injections prior to each chemo   

## 2020-06-09 NOTE — Patient Instructions (Signed)
[facility]   Discharge Instructions: Thank you for choosing [facility] to provide your oncology and hematology care.   If you have a lab appointment with the [facility], please go directly to the [facility] and check in at the registration area.   Wear comfortable clothing and clothing appropriate for easy access to any Portacath or PICC line.   We strive to give you quality time with your provider. You may need to reschedule your appointment if you arrive late (15 or more minutes).  Arriving late affects you and other patients whose appointments are after yours.  Also, if you miss three or more appointments without notifying the office, you may be dismissed from the clinic at the provider's discretion.      For prescription refill requests, have your pharmacy contact our office and allow 72 hours for refills to be completed.    Today you received the following chemotherapy and/or immunotherapy agents: Paclitaxel (Taxol)   To help prevent nausea and vomiting after your treatment, we encourage you to take your nausea medication as directed.  BELOW ARE SYMPTOMS THAT SHOULD BE REPORTED IMMEDIATELY: . *FEVER GREATER THAN 100.4 F (38 C) OR HIGHER . *CHILLS OR SWEATING . *NAUSEA AND VOMITING THAT IS NOT CONTROLLED WITH YOUR NAUSEA MEDICATION . *UNUSUAL SHORTNESS OF BREATH . *UNUSUAL BRUISING OR BLEEDING . *URINARY PROBLEMS (pain or burning when urinating, or frequent urination) . *BOWEL PROBLEMS (unusual diarrhea, constipation, pain near the anus) . TENDERNESS IN MOUTH AND THROAT WITH OR WITHOUT PRESENCE OF ULCERS (sore throat, sores in mouth, or a toothache) . UNUSUAL RASH, SWELLING OR PAIN  . UNUSUAL VAGINAL DISCHARGE OR ITCHING   Items with * indicate a potential emergency and should be followed up as soon as possible or go to the Emergency Department if any problems should occur.  Please show the CHEMOTHERAPY ALERT CARD or  IMMUNOTHERAPY ALERT CARD at check-in to the Emergency Department and triage nurse.  Should you have questions after your visit or need to cancel or reschedule your appointment, please contact [facility]  Dept: [phone]  and follow the prompts.  Office hours are 8:00 a.m. to 4:30 p.m. Monday - Friday. Please note that voicemails left after 4:00 p.m. may not be returned until the following business day.  We are closed weekends and major holidays. You have access to a nurse at all times for urgent questions. Please call the main number to the clinic Dept: [phone] and follow the prompts.   For any non-urgent questions, you may also contact your provider using MyChart. We now offer e-Visits for anyone 64 and older to request care online for non-urgent symptoms. For details visit [email or web address].   Also download the MyChart app! Go to the app store, search "MyChart", open the app, select [facility], and log in with your MyChart username and password.  Due to Covid, a mask is required upon entering the hospital/clinic. If you do not have a mask, one will be given to you upon arrival. For doctor visits, patients may have 1 support person aged 68 or older with them. For treatment visits, patients cannot have anyone with them due to current Covid guidelines and our immunocompromised population.

## 2020-06-12 ENCOUNTER — Inpatient Hospital Stay: Payer: 59 | Attending: Adult Health

## 2020-06-12 ENCOUNTER — Other Ambulatory Visit: Payer: Self-pay

## 2020-06-12 VITALS — BP 108/80 | HR 74 | Temp 98.6°F | Resp 18

## 2020-06-12 DIAGNOSIS — C50411 Malignant neoplasm of upper-outer quadrant of right female breast: Secondary | ICD-10-CM | POA: Insufficient documentation

## 2020-06-12 DIAGNOSIS — Z5112 Encounter for antineoplastic immunotherapy: Secondary | ICD-10-CM | POA: Insufficient documentation

## 2020-06-12 DIAGNOSIS — T451X5A Adverse effect of antineoplastic and immunosuppressive drugs, initial encounter: Secondary | ICD-10-CM | POA: Insufficient documentation

## 2020-06-12 DIAGNOSIS — Z170 Estrogen receptor positive status [ER+]: Secondary | ICD-10-CM | POA: Insufficient documentation

## 2020-06-12 DIAGNOSIS — Z79899 Other long term (current) drug therapy: Secondary | ICD-10-CM | POA: Insufficient documentation

## 2020-06-12 DIAGNOSIS — D6959 Other secondary thrombocytopenia: Secondary | ICD-10-CM | POA: Diagnosis not present

## 2020-06-12 DIAGNOSIS — Z5111 Encounter for antineoplastic chemotherapy: Secondary | ICD-10-CM | POA: Insufficient documentation

## 2020-06-12 DIAGNOSIS — G620 Drug-induced polyneuropathy: Secondary | ICD-10-CM | POA: Insufficient documentation

## 2020-06-12 DIAGNOSIS — L658 Other specified nonscarring hair loss: Secondary | ICD-10-CM | POA: Insufficient documentation

## 2020-06-12 DIAGNOSIS — D702 Other drug-induced agranulocytosis: Secondary | ICD-10-CM | POA: Insufficient documentation

## 2020-06-12 DIAGNOSIS — R5383 Other fatigue: Secondary | ICD-10-CM | POA: Diagnosis not present

## 2020-06-12 DIAGNOSIS — Z95828 Presence of other vascular implants and grafts: Secondary | ICD-10-CM

## 2020-06-12 DIAGNOSIS — D6481 Anemia due to antineoplastic chemotherapy: Secondary | ICD-10-CM | POA: Diagnosis not present

## 2020-06-12 DIAGNOSIS — R110 Nausea: Secondary | ICD-10-CM | POA: Diagnosis not present

## 2020-06-12 MED ORDER — FILGRASTIM-SNDZ 300 MCG/0.5ML IJ SOSY
PREFILLED_SYRINGE | INTRAMUSCULAR | Status: AC
  Filled 2020-06-12: qty 0.5

## 2020-06-12 MED ORDER — FILGRASTIM-SNDZ 300 MCG/0.5ML IJ SOSY
300.0000 ug | PREFILLED_SYRINGE | Freq: Once | INTRAMUSCULAR | Status: AC
  Administered 2020-06-12: 300 ug via SUBCUTANEOUS

## 2020-06-13 ENCOUNTER — Other Ambulatory Visit: Payer: Self-pay

## 2020-06-13 ENCOUNTER — Inpatient Hospital Stay: Payer: 59

## 2020-06-13 VITALS — BP 112/74 | HR 75 | Temp 97.9°F | Resp 18

## 2020-06-13 DIAGNOSIS — C50411 Malignant neoplasm of upper-outer quadrant of right female breast: Secondary | ICD-10-CM | POA: Diagnosis not present

## 2020-06-13 DIAGNOSIS — R110 Nausea: Secondary | ICD-10-CM | POA: Diagnosis not present

## 2020-06-13 DIAGNOSIS — Z5112 Encounter for antineoplastic immunotherapy: Secondary | ICD-10-CM | POA: Diagnosis not present

## 2020-06-13 DIAGNOSIS — D6959 Other secondary thrombocytopenia: Secondary | ICD-10-CM | POA: Diagnosis not present

## 2020-06-13 DIAGNOSIS — Z170 Estrogen receptor positive status [ER+]: Secondary | ICD-10-CM | POA: Diagnosis not present

## 2020-06-13 DIAGNOSIS — L658 Other specified nonscarring hair loss: Secondary | ICD-10-CM | POA: Diagnosis not present

## 2020-06-13 DIAGNOSIS — D6481 Anemia due to antineoplastic chemotherapy: Secondary | ICD-10-CM | POA: Diagnosis not present

## 2020-06-13 DIAGNOSIS — R5383 Other fatigue: Secondary | ICD-10-CM | POA: Diagnosis not present

## 2020-06-13 DIAGNOSIS — Z5111 Encounter for antineoplastic chemotherapy: Secondary | ICD-10-CM | POA: Diagnosis not present

## 2020-06-13 MED ORDER — FILGRASTIM-SNDZ 300 MCG/0.5ML IJ SOSY
300.0000 ug | PREFILLED_SYRINGE | Freq: Once | INTRAMUSCULAR | Status: AC
  Administered 2020-06-13: 300 ug via SUBCUTANEOUS

## 2020-06-15 DIAGNOSIS — H5213 Myopia, bilateral: Secondary | ICD-10-CM | POA: Diagnosis not present

## 2020-06-15 NOTE — Progress Notes (Signed)
Patient Care Team: Emeterio Reeve, DO as PCP - General (Osteopathic Medicine) [name], MD as Consulting Physician (Obstetrics and Gynecology) Mauro Kaufmann, RN as Oncology Nurse Navigator Rockwell Germany, RN as Oncology Nurse Navigator  DIAGNOSIS:    ICD-10-CM   1. Malignant neoplasm of upper-outer quadrant of right breast in female, estrogen receptor positive ([place])  C50.411    Z17.0     SUMMARY OF ONCOLOGIC HISTORY: Oncology History  Malignant neoplasm of upper outer quadrant of female breast ([place])  01/28/2020 Initial Diagnosis   Patient palpated a right breast lump. Diagnostic mammogram and US showed calcifications spanning 2.0cm in the right breast. Biopsy showed invasive and in situ carcinoma, grade 2. 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 Testing was completed through the CustomNext-Cancer + RNAinsight panel offered by [facility]. 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 The report date is 02/13/2020.  The CustomNext-Cancer+RNAinsight panel offered by [facility] includes sequencing and rearrangement analysis for the following 47 genes:  APC, ATM, AXIN2, BARD1, BMPR1A, BRCA1, BRCA2, BRIP1, CDH1, CDK4, CDKN2A, CHEK2, DICER1, EPCAM, GREM1, HOXB13, MEN1, MLH1, MSH2, MSH3, MSH6, MUTYH, NBN, NF1, NF2, NTHL1, PALB2, PMS2, POLD1, POLE, PTEN, RAD51C, RAD51D, RECQL, RET, SDHA, SDHAF2, SDHB, SDHC,  SDHD, SMAD4, SMARCA4, STK11, TP53, TSC1, TSC2, and VHL.  RNA data is routinely analyzed for use in variant interpretation for all genes.     CHIEF COMPLIANT: Cycle4Taxol (Keytruda andCarboplatinevery 3 weeks)  INTERVAL HISTORY: Sydney Herman is a 40 y.o. with above-mentioned history of right cancer currentlyon neoadjuvant chemotherapy withTaxol, Keytruda,andCarboplatinafter completing 4 cycles ofAdriamycin/Cytoxan/Pembrolizumab. She presents to the clinic todayfora toxicity checkandcycle4. Other than fatigue and mild nausea she tolerated the chemo extremely well.  She did not notice any neuropathy.  ALLERGIES:  is allergic to amoxil [amoxicillin].  MEDICATIONS:  Current Outpatient Medications  Medication Sig Dispense Refill  . cycloSPORINE (RESTASIS) 0.05 % ophthalmic emulsion INSILL 1 DROP INTO BOTH EYES TWICE DAILY 180 mL 4  . doxycycline (VIBRA-TABS) 100 MG tablet TAKE 1 TABLET BY MOUTH 2 TIMES DAILY FOR 7 DAYS 14 tablet 0  . escitalopram (LEXAPRO) 10 MG tablet Take 1 tablet (10 mg total) by mouth daily. 90 tablet 3  . lidocaine-prilocaine (EMLA) cream APPLY TO AFFECTED AREA ONCE 30 g 3  . loratadine (CLARITIN) 10 MG tablet 10 mg.    . LORazepam (ATIVAN) 0.5 MG tablet TAKE 1 TABLET BY MOUTH AT BEDTIME AS NEEDED FOR NAUSEA OR VOMITING 30 tablet 0  . ondansetron (ZOFRAN) 8 MG tablet TAKE 1 TABLET BY MOUTH 2 TIMES DAILY AS NEEDED START ON THE THIRD DAY AFTER CHEMO 30 tablet 1  . prochlorperazine (COMPAZINE) 10 MG tablet TAKE 1 TABLET BY MOUTH EVERY 6 HOURS AS NEEDED FOR NAUSEA OR VOMITING 30 tablet 1  . RESTASIS 0.05 % ophthalmic emulsion Place 1 drop into the right eye 2 (two) times daily.  No current facility-administered medications for this visit.    PHYSICAL EXAMINATION: ECOG PERFORMANCE STATUS: 1 - Symptomatic but completely ambulatory  Vitals:   06/16/20 0921  BP: 106/72  Pulse: 68  Resp: 18  Temp: 97.7 F (36.5 C)  SpO2: 100%   Filed Weights    06/16/20 0921  Weight: 123 lb 1.6 oz (55.8 kg)    LABORATORY DATA:  I have reviewed the data as listed CMP Latest Ref Rng & Units 06/09/2020 06/02/2020 05/26/2020  Glucose 70 - 99 mg/dL 86 99 95  BUN 6 - 20 mg/dL _0 Creatinine 0.44 - 1.00 mg/dL 0.69 0.70 0.63  Sodium 135 - 145 mmol/L 140 137 139  Potassium 3.5 - 5.1 mmol/L 3.8 3.7 3.9  Chloride 98 - 111 mmol/L 103 101 104  CO2 22 - 32 mmol/L _1 Calcium 8.9 - 10.3 mg/dL 9.4 9.3 9.0  Total Protein 6.5 - 8.1 g/dL 7.4 7.4 7.1  Total Bilirubin 0.3 - 1.2 mg/dL 0.2(L) 0.4 0.3  Alkaline Phos 38 - 126 U/L 73 53 54  AST 15 - 41 U/L 27 30 37  ALT 0 - 44 U/L 22 32 38    Lab Results  Component Value Date   WBC 4.5 06/16/2020   HGB 10.7 (L) 06/16/2020   HCT 31.6 (L) 06/16/2020   MCV 96.6 06/16/2020   PLT 266 06/16/2020   NEUTROABS 2.7 06/16/2020    ASSESSMENT & PLAN:  Malignant neoplasm of upper outer quadrant of female breast ([place]) 01/28/2020: Palpable right breast lump: Mammogram and ultrasound revealed calcifications spanning 2 cm, biopsy revealed IDC with DCIS, grade 2, 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  Treatment plan: 1. Neo- adjuvant chemotherapy with dose dense Adriamycin and Cytoxan/Keytrudafollowed by Taxol and carboplatin 2.bilateral mastectomieswith sentinel lymph node biopsy 3. Adjuvant radiation 4. followed by adjuvant antiestrogen therapy 5. Genetic testing _______________________________________________________________________ Current Treatment:Completed 4 cycles of neoadjuvant chemotherapy with Adriamycin/Cytoxan/Keytruda,today cycle4Taxol (carboplatin and Keytruda every 3 weeks)   Chemo toxicities: 1.Alopecia 2.fatigue: Stable 3.Chemotherapy-induced anemia: Hemoglobin10.4:  Monitoring 4.Thrombocytopenia: Today she receives carboplatin so most likely her platelet counts might come down.  We will have to watch carefully. 5.Leukopenia/neutropenia: We are administering Granix injections prior to each chemo.  She will get 2 injections prior to each treatment on Friday and Saturday.   Return to clinic next week to assess blood counts and tolerability to chemo.    No orders of the defined types were placed in this encounter.  The patient has a good understanding of the overall plan. she agrees with it. she will call with any problems that may develop before the next visit here.  Total time spent: 30 mins including face to face time and time spent for planning, charting and coordination of care  Rulon Eisenmenger, MD, MPH 06/16/2020  I, Cloyde Reams Dorshimer, am acting as scribe for Dr. Nicholas Lose.  I have reviewed the above documentation for accuracy and completeness, and I agree with the above.

## 2020-06-15 NOTE — Assessment & Plan Note (Signed)
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  Treatment plan: 1. Neo- adjuvant chemotherapy with dose dense Adriamycin and Cytoxan/Keytrudafollowed by Taxol and carboplatin 2.bilateral mastectomieswith sentinel lymph node biopsy 3. Adjuvant radiation 4. followed by adjuvant antiestrogen therapy 5. Genetic testing _______________________________________________________________________ Current Treatment:Completed 4 cycles of neoadjuvant chemotherapy with Adriamycin/Cytoxan/Keytruda,today cycle4Taxol (carboplatin and Keytruda every 3 weeks)   Chemo toxicities: 1.Alopecia 2.fatigue: Stable 3.Chemotherapy-induced anemia: Hemoglobin10.4: Monitoring 4.Thrombocytopenia: Platelet count 59 on 05/26/2020, Treatment was held (cycle 3): This is most likely related to carboplatin.  He has prescription today but ANC is 2.7 and platelets are 177 5.Leukopenia/neutropenia: We are administering Granix injections prior to each chemo.  She will get 2 injections prior to each treatment on Friday and Saturday.

## 2020-06-16 ENCOUNTER — Encounter: Payer: Self-pay | Admitting: *Deleted

## 2020-06-16 ENCOUNTER — Inpatient Hospital Stay (HOSPITAL_BASED_OUTPATIENT_CLINIC_OR_DEPARTMENT_OTHER): Payer: 59 | Admitting: Hematology and Oncology

## 2020-06-16 ENCOUNTER — Inpatient Hospital Stay: Payer: 59

## 2020-06-16 ENCOUNTER — Other Ambulatory Visit: Payer: 59

## 2020-06-16 ENCOUNTER — Other Ambulatory Visit: Payer: Self-pay

## 2020-06-16 DIAGNOSIS — C50411 Malignant neoplasm of upper-outer quadrant of right female breast: Secondary | ICD-10-CM | POA: Diagnosis not present

## 2020-06-16 DIAGNOSIS — Z5112 Encounter for antineoplastic immunotherapy: Secondary | ICD-10-CM | POA: Diagnosis not present

## 2020-06-16 DIAGNOSIS — R5383 Other fatigue: Secondary | ICD-10-CM | POA: Diagnosis not present

## 2020-06-16 DIAGNOSIS — D6959 Other secondary thrombocytopenia: Secondary | ICD-10-CM | POA: Diagnosis not present

## 2020-06-16 DIAGNOSIS — Z95828 Presence of other vascular implants and grafts: Secondary | ICD-10-CM

## 2020-06-16 DIAGNOSIS — Z170 Estrogen receptor positive status [ER+]: Secondary | ICD-10-CM

## 2020-06-16 DIAGNOSIS — L658 Other specified nonscarring hair loss: Secondary | ICD-10-CM | POA: Diagnosis not present

## 2020-06-16 DIAGNOSIS — R110 Nausea: Secondary | ICD-10-CM | POA: Diagnosis not present

## 2020-06-16 DIAGNOSIS — Z5111 Encounter for antineoplastic chemotherapy: Secondary | ICD-10-CM | POA: Diagnosis not present

## 2020-06-16 DIAGNOSIS — D6481 Anemia due to antineoplastic chemotherapy: Secondary | ICD-10-CM | POA: Diagnosis not present

## 2020-06-16 LAB — CMP (CANCER CENTER ONLY)
ALT: 27 U/L (ref 0–44)
AST: 35 U/L (ref 15–41)
Albumin: 4.3 g/dL (ref 3.5–5.0)
Alkaline Phosphatase: 78 U/L (ref 38–126)
Anion gap: 10 (ref 5–15)
BUN: 7 mg/dL (ref 6–20)
CO2: 26 mmol/L (ref 22–32)
Calcium: 9.7 mg/dL (ref 8.9–10.3)
Chloride: 102 mmol/L (ref 98–111)
Creatinine: 0.68 mg/dL (ref 0.44–1.00)
GFR, Estimated: >60 mL/min (ref >60)
Glucose, Bld: 93 mg/dL (ref 70–99)
Potassium: 3.8 mmol/L (ref 3.5–5.1)
Sodium: 138 mmol/L (ref 135–145)
Total Bilirubin: 0.3 mg/dL (ref 0.3–1.2)
Total Protein: 7.9 g/dL (ref 6.5–8.1)

## 2020-06-16 LAB — CBC WITH DIFFERENTIAL (CANCER CENTER ONLY)
Abs Immature Granulocytes: 0.01 10*3/uL (ref 0.00–0.07)
Basophils Absolute: 0 10*3/uL (ref 0.0–0.1)
Basophils Relative: 0 %
Eosinophils Absolute: 0 10*3/uL (ref 0.0–0.5)
Eosinophils Relative: 0 %
HCT: 31.6 % — ABNORMAL LOW (ref 36.0–46.0)
Hemoglobin: 10.7 g/dL — ABNORMAL LOW (ref 12.0–15.0)
Immature Granulocytes: 0 %
Lymphocytes Relative: 30 %
Lymphs Abs: 1.4 10*3/uL (ref 0.7–4.0)
MCH: 32.7 pg (ref 26.0–34.0)
MCHC: 33.9 g/dL (ref 30.0–36.0)
MCV: 96.6 fL (ref 80.0–100.0)
Monocytes Absolute: 0.4 10*3/uL (ref 0.1–1.0)
Monocytes Relative: 9 %
Neutro Abs: 2.7 10*3/uL (ref 1.7–7.7)
Neutrophils Relative %: 61 %
Platelet Count: 266 10*3/uL (ref 150–400)
RBC: 3.27 MIL/uL — ABNORMAL LOW (ref 3.87–5.11)
RDW: 14.4 % (ref 11.5–15.5)
WBC Count: 4.5 10*3/uL (ref 4.0–10.5)
nRBC: 0 % (ref 0.0–0.2)

## 2020-06-16 LAB — PREGNANCY, URINE: Preg Test, Ur: NEGATIVE

## 2020-06-16 LAB — TSH: TSH: 0.673 u[IU]/mL (ref 0.308–3.960)

## 2020-06-16 MED ORDER — HEPARIN SOD (PORK) LOCK FLUSH 100 UNIT/ML IV SOLN
500.0000 [IU] | Freq: Once | INTRAVENOUS | Status: AC | PRN
  Administered 2020-06-16: 500 [IU]
  Filled 2020-06-16: qty 5

## 2020-06-16 MED ORDER — PALONOSETRON HCL INJECTION 0.25 MG/5ML
0.2500 mg | Freq: Once | INTRAVENOUS | Status: AC
  Administered 2020-06-16: 0.25 mg via INTRAVENOUS

## 2020-06-16 MED ORDER — SODIUM CHLORIDE 0.9 % IV SOLN
500.0000 mg | Freq: Once | INTRAVENOUS | Status: AC
  Administered 2020-06-16: 500 mg via INTRAVENOUS
  Filled 2020-06-16: qty 50

## 2020-06-16 MED ORDER — SODIUM CHLORIDE 0.9% FLUSH
10.0000 mL | INTRAVENOUS | Status: DC | PRN
  Administered 2020-06-16: 10 mL
  Filled 2020-06-16: qty 10

## 2020-06-16 MED ORDER — SODIUM CHLORIDE 0.9% FLUSH
10.0000 mL | Freq: Once | INTRAVENOUS | Status: AC
  Administered 2020-06-16: 10 mL
  Filled 2020-06-16: qty 10

## 2020-06-16 MED ORDER — SODIUM CHLORIDE 0.9 % IV SOLN
10.0000 mg | Freq: Once | INTRAVENOUS | Status: AC
  Administered 2020-06-16: 10 mg via INTRAVENOUS
  Filled 2020-06-16: qty 10

## 2020-06-16 MED ORDER — SODIUM CHLORIDE 0.9 % IV SOLN
Freq: Once | INTRAVENOUS | Status: AC
  Filled 2020-06-16: qty 250

## 2020-06-16 MED ORDER — FAMOTIDINE 20 MG IN NS 100 ML IVPB
20.0000 mg | Freq: Once | INTRAVENOUS | Status: AC
  Administered 2020-06-16: 20 mg via INTRAVENOUS

## 2020-06-16 MED ORDER — DIPHENHYDRAMINE HCL 50 MG/ML IJ SOLN
INTRAMUSCULAR | Status: AC
  Filled 2020-06-16: qty 1

## 2020-06-16 MED ORDER — PALONOSETRON HCL INJECTION 0.25 MG/5ML
INTRAVENOUS | Status: AC
  Filled 2020-06-16: qty 5

## 2020-06-16 MED ORDER — SODIUM CHLORIDE 0.9 % IV SOLN
65.0000 mg/m2 | Freq: Once | INTRAVENOUS | Status: AC
  Administered 2020-06-16: 102 mg via INTRAVENOUS
  Filled 2020-06-16: qty 17

## 2020-06-16 MED ORDER — FAMOTIDINE 20 MG IN NS 100 ML IVPB
INTRAVENOUS | Status: AC
  Filled 2020-06-16: qty 100

## 2020-06-16 MED ORDER — SODIUM CHLORIDE 0.9 % IV SOLN
150.0000 mg | Freq: Once | INTRAVENOUS | Status: AC
  Administered 2020-06-16: 150 mg via INTRAVENOUS
  Filled 2020-06-16: qty 150

## 2020-06-16 MED ORDER — SODIUM CHLORIDE 0.9 % IV SOLN
200.0000 mg | Freq: Once | INTRAVENOUS | Status: AC
  Administered 2020-06-16: 200 mg via INTRAVENOUS
  Filled 2020-06-16: qty 8

## 2020-06-16 MED ORDER — DIPHENHYDRAMINE HCL 50 MG/ML IJ SOLN
25.0000 mg | Freq: Once | INTRAMUSCULAR | Status: AC
  Administered 2020-06-16: 25 mg via INTRAVENOUS

## 2020-06-16 NOTE — Patient Instructions (Signed)
[facility]  Discharge Instructions: Thank you for choosing [facility] to provide your oncology and hematology care.   If you have a lab appointment with the [facility], please go directly to the [facility] and check in at the registration area.   Wear comfortable clothing and clothing appropriate for easy access to any Portacath or PICC line.   We strive to give you quality time with your provider. You may need to reschedule your appointment if you arrive late (15 or more minutes).  Arriving late affects you and other patients whose appointments are after yours.  Also, if you miss three or more appointments without notifying the office, you may be dismissed from the clinic at the provider's discretion.      For prescription refill requests, have your pharmacy contact our office and allow 72 hours for refills to be completed.    Today you received the following chemotherapy and/or immunotherapy agents keytruda; taxol; carboplatin   To help prevent nausea and vomiting after your treatment, we encourage you to take your nausea medication as directed.  BELOW ARE SYMPTOMS THAT SHOULD BE REPORTED IMMEDIATELY: . *FEVER GREATER THAN 100.4 F (38 C) OR HIGHER . *CHILLS OR SWEATING . *NAUSEA AND VOMITING THAT IS NOT CONTROLLED WITH YOUR NAUSEA MEDICATION . *UNUSUAL SHORTNESS OF BREATH . *UNUSUAL BRUISING OR BLEEDING . *URINARY PROBLEMS (pain or burning when urinating, or frequent urination) . *BOWEL PROBLEMS (unusual diarrhea, constipation, pain near the anus) . TENDERNESS IN MOUTH AND THROAT WITH OR WITHOUT PRESENCE OF ULCERS (sore throat, sores in mouth, or a toothache) . UNUSUAL RASH, SWELLING OR PAIN  . UNUSUAL VAGINAL DISCHARGE OR ITCHING   Items with * indicate a potential emergency and should be followed up as soon as possible or go to the Emergency Department if any problems should occur.  Please show the CHEMOTHERAPY ALERT CARD or  IMMUNOTHERAPY ALERT CARD at check-in to the Emergency Department and triage nurse.  Should you have questions after your visit or need to cancel or reschedule your appointment, please contact [facility]  Dept: [phone]  and follow the prompts.  Office hours are 8:00 a.m. to 4:30 p.m. Monday - Friday. Please note that voicemails left after 4:00 p.m. may not be returned until the following business day.  We are closed weekends and major holidays. You have access to a nurse at all times for urgent questions. Please call the main number to the clinic Dept: [phone] and follow the prompts.   For any non-urgent questions, you may also contact your provider using MyChart. We now offer e-Visits for anyone 28 and older to request care online for non-urgent symptoms. For details visit [email or web address].   Also download the MyChart app! Go to the app store, search "MyChart", open the app, select [facility], and log in with your MyChart username and password.  Due to Covid, a mask is required upon entering the hospital/clinic. If you do not have a mask, one will be given to you upon arrival. For doctor visits, patients may have 1 support person aged 76 or older with them. For treatment visits, patients cannot have anyone with them due to current Covid guidelines and our immunocompromised population.

## 2020-06-19 ENCOUNTER — Inpatient Hospital Stay: Payer: 59

## 2020-06-19 ENCOUNTER — Telehealth: Payer: Self-pay | Admitting: Hematology and Oncology

## 2020-06-19 ENCOUNTER — Other Ambulatory Visit: Payer: Self-pay

## 2020-06-19 VITALS — BP 113/72 | HR 79 | Temp 98.1°F | Resp 18

## 2020-06-19 DIAGNOSIS — Z95828 Presence of other vascular implants and grafts: Secondary | ICD-10-CM

## 2020-06-19 DIAGNOSIS — C50411 Malignant neoplasm of upper-outer quadrant of right female breast: Secondary | ICD-10-CM | POA: Diagnosis not present

## 2020-06-19 DIAGNOSIS — R110 Nausea: Secondary | ICD-10-CM | POA: Diagnosis not present

## 2020-06-19 DIAGNOSIS — R5383 Other fatigue: Secondary | ICD-10-CM | POA: Diagnosis not present

## 2020-06-19 DIAGNOSIS — D6481 Anemia due to antineoplastic chemotherapy: Secondary | ICD-10-CM | POA: Diagnosis not present

## 2020-06-19 DIAGNOSIS — Z5111 Encounter for antineoplastic chemotherapy: Secondary | ICD-10-CM | POA: Diagnosis not present

## 2020-06-19 DIAGNOSIS — Z170 Estrogen receptor positive status [ER+]: Secondary | ICD-10-CM

## 2020-06-19 DIAGNOSIS — D6959 Other secondary thrombocytopenia: Secondary | ICD-10-CM | POA: Diagnosis not present

## 2020-06-19 DIAGNOSIS — Z5112 Encounter for antineoplastic immunotherapy: Secondary | ICD-10-CM | POA: Diagnosis not present

## 2020-06-19 DIAGNOSIS — L658 Other specified nonscarring hair loss: Secondary | ICD-10-CM | POA: Diagnosis not present

## 2020-06-19 MED ORDER — FILGRASTIM-SNDZ 300 MCG/0.5ML IJ SOSY
PREFILLED_SYRINGE | INTRAMUSCULAR | Status: AC
  Filled 2020-06-19: qty 0.5

## 2020-06-19 MED ORDER — FILGRASTIM-SNDZ 300 MCG/0.5ML IJ SOSY
300.0000 ug | PREFILLED_SYRINGE | Freq: Once | INTRAMUSCULAR | Status: AC
  Administered 2020-06-19: 300 ug via SUBCUTANEOUS

## 2020-06-19 NOTE — Telephone Encounter (Signed)
Scheduled per 6/7 los and 6/10 staff msg. Pt will receive an updated appt calendar per next visit

## 2020-06-20 ENCOUNTER — Inpatient Hospital Stay: Payer: 59

## 2020-06-20 ENCOUNTER — Other Ambulatory Visit: Payer: Self-pay

## 2020-06-20 VITALS — BP 116/75 | HR 76 | Temp 97.1°F | Resp 18

## 2020-06-20 DIAGNOSIS — L658 Other specified nonscarring hair loss: Secondary | ICD-10-CM | POA: Diagnosis not present

## 2020-06-20 DIAGNOSIS — Z5112 Encounter for antineoplastic immunotherapy: Secondary | ICD-10-CM | POA: Diagnosis not present

## 2020-06-20 DIAGNOSIS — C50411 Malignant neoplasm of upper-outer quadrant of right female breast: Secondary | ICD-10-CM

## 2020-06-20 DIAGNOSIS — D6959 Other secondary thrombocytopenia: Secondary | ICD-10-CM | POA: Diagnosis not present

## 2020-06-20 DIAGNOSIS — Z170 Estrogen receptor positive status [ER+]: Secondary | ICD-10-CM

## 2020-06-20 DIAGNOSIS — R110 Nausea: Secondary | ICD-10-CM | POA: Diagnosis not present

## 2020-06-20 DIAGNOSIS — D6481 Anemia due to antineoplastic chemotherapy: Secondary | ICD-10-CM | POA: Diagnosis not present

## 2020-06-20 DIAGNOSIS — Z5111 Encounter for antineoplastic chemotherapy: Secondary | ICD-10-CM | POA: Diagnosis not present

## 2020-06-20 DIAGNOSIS — Z95828 Presence of other vascular implants and grafts: Secondary | ICD-10-CM

## 2020-06-20 DIAGNOSIS — R5383 Other fatigue: Secondary | ICD-10-CM | POA: Diagnosis not present

## 2020-06-20 MED ORDER — FILGRASTIM-SNDZ 300 MCG/0.5ML IJ SOSY
300.0000 ug | PREFILLED_SYRINGE | Freq: Once | INTRAMUSCULAR | Status: AC
  Administered 2020-06-20: 300 ug via SUBCUTANEOUS

## 2020-06-20 MED ORDER — FILGRASTIM-SNDZ 300 MCG/0.5ML IJ SOSY
PREFILLED_SYRINGE | INTRAMUSCULAR | Status: AC
  Filled 2020-06-20: qty 0.5

## 2020-06-20 NOTE — Patient Instructions (Signed)
Filgrastim, G-CSF injection What is this medication? FILGRASTIM, G-CSF (fil GRA stim) is a granulocyte colony-stimulating factor that stimulates the growth of neutrophils, a type of white blood cell (WBC) important in the body's fight against infection. It is used to reduce the incidence of fever and infection in patients with certain types of cancer who are receiving chemotherapy that affects the bone marrow, to stimulate blood cell production for removal of WBCs from the body prior to a bone marrow transplantation, to reduce the incidence of fever and infection in patients who have severe chronic neutropenia, and to improve survival outcomes following high-dose radiation exposure that is toxic to the bone marrow. This medicine may be used for other purposes; ask your health care provider or pharmacist if you have questions. COMMON BRAND NAME(S): Neupogen, Nivestym, Releuko, Zarxio What should I tell my care team before I take this medication? They need to know if you have any of these conditions: kidney disease latex allergy ongoing radiation therapy sickle cell disease an unusual or allergic reaction to filgrastim, pegfilgrastim, other medicines, foods, dyes, or preservatives pregnant or trying to get pregnant breast-feeding How should I use this medication? This medicine is for injection under the skin or infusion into a vein. As an infusion into a vein, it is usually given by a health care professional in a hospital or clinic setting. If you get this medicine at home, you will be taught how to prepare and give this medicine. Refer to the Instructions for Use that come with your medication packaging. Use exactly as directed. Take your medicine at regular intervals. Do not take your medicine more often than directed. It is important that you put your used needles and syringes in a special sharps container. Do not put them in a trash can. If you do not have a sharps container, call your pharmacist  or healthcare provider to get one. Talk to your pediatrician regarding the use of this medicine in children. While this drug may be prescribed for children as young as 7 months for selected conditions, precautions do apply. Overdosage: If you think you have taken too much of this medicine contact a poison control center or emergency room at once. NOTE: This medicine is only for you. Do not share this medicine with others. What if I miss a dose? It is important not to miss your dose. Call your doctor or health care professional if you miss a dose. What may interact with this medication? This medicine may interact with the following medications: medicines that may cause a release of neutrophils, such as lithium This list may not describe all possible interactions. Give your health care provider a list of all the medicines, herbs, non-prescription drugs, or dietary supplements you use. Also tell them if you smoke, drink alcohol, or use illegal drugs. Some items may interact with your medicine. What should I watch for while using this medication? Your condition will be monitored carefully while you are receiving this medicine. You may need blood work done while you are taking this medicine. Talk to your health care provider about your risk of cancer. You may be more at risk for certain types of cancer if you take this medicine. What side effects may I notice from receiving this medication? Side effects that you should report to your doctor or health care professional as soon as possible: allergic reactions like skin rash, itching or hives, swelling of the face, lips, or tongue back pain dizziness or feeling faint fever pain, redness, or   irritation at site where injected pinpoint red spots on the skin shortness of breath or breathing problems signs and symptoms of kidney injury like trouble passing urine, change in the amount of urine, or red or dark-brown urine stomach or side pain, or pain at  the shoulder swelling tiredness unusual bleeding or bruising Side effects that usually do not require medical attention (report to your doctor or health care professional if they continue or are bothersome): bone pain cough diarrhea hair loss headache muscle pain This list may not describe all possible side effects. Call your doctor for medical advice about side effects. You may report side effects to FDA at 1-800-FDA-1088. Where should I keep my medication? Keep out of the reach of children. Store in a refrigerator between 2 and 8 degrees C (36 and 46 degrees F). Do not freeze. Keep in carton to protect from light. Throw away this medicine if vials or syringes are left out of the refrigerator for more than 24 hours. Throw away any unused medicine after the expiration date. NOTE: This sheet is a summary. It may not cover all possible information. If you have questions about this medicine, talk to your doctor, pharmacist, or health care provider.  2022 Elsevier/Gold Standard (2019-01-17 18:47:55)  

## 2020-06-22 NOTE — Progress Notes (Signed)
Patient Care Team: Emeterio Reeve, DO as PCP - General (Osteopathic Medicine) [name], MD as Consulting Physician (Obstetrics and Gynecology) Mauro Kaufmann, RN as Oncology Nurse Navigator Rockwell Germany, RN as Oncology Nurse Navigator  DIAGNOSIS:    ICD-10-CM   1. Malignant neoplasm of upper-outer quadrant of right breast in female, estrogen receptor positive ([place])  C50.411    Z17.0       SUMMARY OF ONCOLOGIC HISTORY: Oncology History  Malignant neoplasm of upper outer quadrant of female breast ([place])  01/28/2020 Initial Diagnosis   Patient palpated a right breast lump. Diagnostic mammogram and US showed calcifications spanning 2.0cm in the right breast. Biopsy showed invasive and in situ carcinoma, grade 2. ER 5% weak, PR 0% negative, HER2 equiv, Ki 10%     01/28/2020 Cancer Staging   Staging form: Breast, AJCC 8th Edition - Clinical stage from 01/28/2020: Stage IA (cT1c, cN0, cM0, G2, ER+, PR-, HER2: Equivocal) - Signed by Nicholas Lose, MD on 01/29/2020    02/05/2020 -  Chemotherapy    Patient is on Treatment Plan: BREAST DOSE DENSE AC Q14D / CARBOPLATIN D1 + PACLITAXEL D1,8,15 Q21D       02/13/2020 Genetic Testing   Positive genetic testing:  A single, heterozygous, pathogenic variant was detected in the BRCA2 gene called c.2808_2811delACAA. Testing was completed through the CustomNext-Cancer + RNAinsight panel offered by [facility]. 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 The report date is 02/13/2020.  The CustomNext-Cancer+RNAinsight panel offered by [facility] includes sequencing and rearrangement analysis for the following 47 genes:  APC, ATM, AXIN2, BARD1, BMPR1A, BRCA1, BRCA2, BRIP1, CDH1, CDK4, CDKN2A, CHEK2, DICER1, EPCAM, GREM1, HOXB13, MEN1, MLH1, MSH2, MSH3, MSH6, MUTYH, NBN, NF1, NF2, NTHL1, PALB2, PMS2, POLD1, POLE, PTEN, RAD51C, RAD51D, RECQL, RET, SDHA, SDHAF2, SDHB,  SDHC, SDHD, SMAD4, SMARCA4, STK11, TP53, TSC1, TSC2, and VHL.  RNA data is routinely analyzed for use in variant interpretation for all genes.     CHIEF COMPLIANT: Cycle 5 Taxol (Keytruda and Carboplatin every 3 weeks)  INTERVAL HISTORY: Sydney Herman is a 40 y.o. with above-mentioned history of right cancer currently on neoadjuvant chemotherapy with Taxol, Keytruda, and Carboplatin after completing 4 cycles of Adriamycin/Cytoxan/Pembrolizumab. She presents to the clinic today for a toxicity check and cycle 5.  She had experienced worsening fatigue after last treatment but otherwise no new symptoms or concerns.  Denies any neuropathy symptoms.  Did not have any nausea or vomiting.  She is planning to go to the beach after today's treatment.  ALLERGIES:  is allergic to amoxil [amoxicillin].  MEDICATIONS:  Current Outpatient Medications  Medication Sig Dispense Refill   cycloSPORINE (RESTASIS) 0.05 % ophthalmic emulsion INSILL 1 DROP INTO BOTH EYES TWICE DAILY 180 mL 4   doxycycline (VIBRA-TABS) 100 MG tablet TAKE 1 TABLET BY MOUTH 2 TIMES DAILY FOR 7 DAYS 14 tablet 0   escitalopram (LEXAPRO) 10 MG tablet Take 1 tablet (10 mg total) by mouth daily. 90 tablet 3   lidocaine-prilocaine (EMLA) cream APPLY TO AFFECTED AREA ONCE 30 g 3   loratadine (CLARITIN) 10 MG tablet 10 mg.     LORazepam (ATIVAN) 0.5 MG tablet TAKE 1 TABLET BY MOUTH AT BEDTIME AS NEEDED FOR NAUSEA OR VOMITING 30 tablet 0   ondansetron (ZOFRAN) 8 MG tablet TAKE 1 TABLET BY MOUTH 2 TIMES DAILY AS NEEDED START ON THE THIRD DAY AFTER CHEMO 30 tablet 1   prochlorperazine (COMPAZINE) 10  MG tablet TAKE 1 TABLET BY MOUTH EVERY 6 HOURS AS NEEDED FOR NAUSEA OR VOMITING 30 tablet 1   RESTASIS 0.05 % ophthalmic emulsion Place 1 drop into the right eye 2 (two) times daily.     No current facility-administered medications for this visit.    PHYSICAL EXAMINATION: ECOG PERFORMANCE STATUS: 1 - Symptomatic but completely ambulatory  Vitals:    06/23/20 0912  BP: 116/80  Pulse: 80  Resp: 20  Temp: 97.9 F (36.6 C)  SpO2: 100%   Filed Weights   06/23/20 0912  Weight: 120 lb 11.2 oz (54.7 kg)    LABORATORY DATA:  I have reviewed the data as listed CMP Latest Ref Rng & Units 06/16/2020 06/09/2020 06/02/2020  Glucose 70 - 99 mg/dL 93 86 99  BUN 6 - 20 mg/dL _0 Creatinine 0.44 - 1.00 mg/dL 0.68 0.69 0.70  Sodium 135 - 145 mmol/L 138 140 137  Potassium 3.5 - 5.1 mmol/L 3.8 3.8 3.7  Chloride 98 - 111 mmol/L 102 103 101  CO2 22 - 32 mmol/L _1 Calcium 8.9 - 10.3 mg/dL 9.7 9.4 9.3  Total Protein 6.5 - 8.1 g/dL 7.9 7.4 7.4  Total Bilirubin 0.3 - 1.2 mg/dL 0.3 0.2(L) 0.4  Alkaline Phos 38 - 126 U/L 78 73 53  AST 15 - 41 U/L 35 27 30  ALT 0 - 44 U/L 27 22 32    Lab Results  Component Value Date   WBC 4.0 06/23/2020   HGB 10.3 (L) 06/23/2020   HCT 30.0 (L) 06/23/2020   MCV 95.8 06/23/2020   PLT 254 06/23/2020   NEUTROABS 2.7 06/23/2020    ASSESSMENT & PLAN:  Malignant neoplasm of upper outer quadrant of female breast ([place]) 01/28/2020: Palpable right breast lump: Mammogram and ultrasound revealed calcifications spanning 2 cm, biopsy revealed IDC with DCIS, grade 2, 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     Treatment plan: 1. Neo- adjuvant chemotherapy with dose dense Adriamycin and Cytoxan/Keytruda followed by Taxol and carboplatin 2. bilateral mastectomies with sentinel lymph node biopsy 3. Adjuvant radiation 4. followed by adjuvant antiestrogen therapy 5.  Genetic testing _______________________________________________________________________ Current Treatment: Completed 4 cycles of neoadjuvant chemotherapy with Adriamycin/Cytoxan/Keytruda, today cycle 5 Taxol (carboplatin and Keytruda every 3 weeks)   Chemo  toxicities: 1.  Alopecia 2. fatigue: Stable 3.  Chemotherapy-induced anemia: Hemoglobin 10.3: Monitoring 4.  Thrombocytopenia: Today's platelet count is 254. 5.  Leukopenia/neutropenia: We are administering Granix (2 injections) prior to each chemo.     She is planning to go to the beach after today's treatment or [place].  I discussed with her about avoiding direct sun exposure for long periods of time. Return to clinic next week to assess blood counts and tolerability to chemo.    No orders of the defined types were placed in this encounter.  The patient has a good understanding of the overall plan. she agrees with it. she will call with any problems that may develop before the next visit here.  Total time spent: 30 mins including face to face time and time spent for planning, charting and coordination of care  Rulon Eisenmenger, MD, MPH 06/23/2020  I, Molly Dorshimer, am acting as scribe for Dr. Nicholas Lose.  I have reviewed the above documentation for accuracy and  completeness, and I agree with the above.       

## 2020-06-23 ENCOUNTER — Ambulatory Visit: Payer: 59 | Admitting: Hematology and Oncology

## 2020-06-23 ENCOUNTER — Other Ambulatory Visit: Payer: 59

## 2020-06-23 ENCOUNTER — Inpatient Hospital Stay: Payer: 59

## 2020-06-23 ENCOUNTER — Other Ambulatory Visit: Payer: Self-pay

## 2020-06-23 ENCOUNTER — Inpatient Hospital Stay (HOSPITAL_BASED_OUTPATIENT_CLINIC_OR_DEPARTMENT_OTHER): Payer: 59 | Admitting: Hematology and Oncology

## 2020-06-23 ENCOUNTER — Ambulatory Visit: Payer: 59

## 2020-06-23 DIAGNOSIS — R5383 Other fatigue: Secondary | ICD-10-CM | POA: Diagnosis not present

## 2020-06-23 DIAGNOSIS — Z170 Estrogen receptor positive status [ER+]: Secondary | ICD-10-CM

## 2020-06-23 DIAGNOSIS — Z5111 Encounter for antineoplastic chemotherapy: Secondary | ICD-10-CM | POA: Diagnosis not present

## 2020-06-23 DIAGNOSIS — Z5112 Encounter for antineoplastic immunotherapy: Secondary | ICD-10-CM | POA: Diagnosis not present

## 2020-06-23 DIAGNOSIS — L658 Other specified nonscarring hair loss: Secondary | ICD-10-CM | POA: Diagnosis not present

## 2020-06-23 DIAGNOSIS — C50411 Malignant neoplasm of upper-outer quadrant of right female breast: Secondary | ICD-10-CM | POA: Diagnosis not present

## 2020-06-23 DIAGNOSIS — D6959 Other secondary thrombocytopenia: Secondary | ICD-10-CM | POA: Diagnosis not present

## 2020-06-23 DIAGNOSIS — Z95828 Presence of other vascular implants and grafts: Secondary | ICD-10-CM

## 2020-06-23 DIAGNOSIS — R110 Nausea: Secondary | ICD-10-CM | POA: Diagnosis not present

## 2020-06-23 DIAGNOSIS — D6481 Anemia due to antineoplastic chemotherapy: Secondary | ICD-10-CM | POA: Diagnosis not present

## 2020-06-23 LAB — CBC WITH DIFFERENTIAL (CANCER CENTER ONLY)
Abs Immature Granulocytes: 0.01 10*3/uL (ref 0.00–0.07)
Basophils Absolute: 0 10*3/uL (ref 0.0–0.1)
Basophils Relative: 1 %
Eosinophils Absolute: 0 10*3/uL (ref 0.0–0.5)
Eosinophils Relative: 0 %
HCT: 30 % — ABNORMAL LOW (ref 36.0–46.0)
Hemoglobin: 10.3 g/dL — ABNORMAL LOW (ref 12.0–15.0)
Immature Granulocytes: 0 %
Lymphocytes Relative: 24 %
Lymphs Abs: 1 10*3/uL (ref 0.7–4.0)
MCH: 32.9 pg (ref 26.0–34.0)
MCHC: 34.3 g/dL (ref 30.0–36.0)
MCV: 95.8 fL (ref 80.0–100.0)
Monocytes Absolute: 0.4 10*3/uL (ref 0.1–1.0)
Monocytes Relative: 9 %
Neutro Abs: 2.7 10*3/uL (ref 1.7–7.7)
Neutrophils Relative %: 66 %
Platelet Count: 254 10*3/uL (ref 150–400)
RBC: 3.13 MIL/uL — ABNORMAL LOW (ref 3.87–5.11)
RDW: 14.2 % (ref 11.5–15.5)
WBC Count: 4 10*3/uL (ref 4.0–10.5)
nRBC: 0 % (ref 0.0–0.2)

## 2020-06-23 LAB — CMP (CANCER CENTER ONLY)
ALT: 28 U/L (ref 0–44)
AST: 29 U/L (ref 15–41)
Albumin: 4.1 g/dL (ref 3.5–5.0)
Alkaline Phosphatase: 78 U/L (ref 38–126)
Anion gap: 7 (ref 5–15)
BUN: 10 mg/dL (ref 6–20)
CO2: 29 mmol/L (ref 22–32)
Calcium: 9.4 mg/dL (ref 8.9–10.3)
Chloride: 103 mmol/L (ref 98–111)
Creatinine: 0.68 mg/dL (ref 0.44–1.00)
GFR, Estimated: >60 mL/min (ref >60)
Glucose, Bld: 97 mg/dL (ref 70–99)
Potassium: 3.8 mmol/L (ref 3.5–5.1)
Sodium: 139 mmol/L (ref 135–145)
Total Bilirubin: 0.4 mg/dL (ref 0.3–1.2)
Total Protein: 7.4 g/dL (ref 6.5–8.1)

## 2020-06-23 LAB — PREGNANCY, URINE: Preg Test, Ur: NEGATIVE

## 2020-06-23 LAB — TSH: TSH: 0.569 u[IU]/mL (ref 0.308–3.960)

## 2020-06-23 MED ORDER — FAMOTIDINE 20 MG IN NS 100 ML IVPB
20.0000 mg | Freq: Once | INTRAVENOUS | Status: AC
  Administered 2020-06-23: 20 mg via INTRAVENOUS

## 2020-06-23 MED ORDER — SODIUM CHLORIDE 0.9% FLUSH
10.0000 mL | Freq: Once | INTRAVENOUS | Status: AC
Start: 2020-06-23 — End: 2020-06-23
  Administered 2020-06-23: 10 mL
  Filled 2020-06-23: qty 10

## 2020-06-23 MED ORDER — SODIUM CHLORIDE 0.9 % IV SOLN
65.0000 mg/m2 | Freq: Once | INTRAVENOUS | Status: AC
  Administered 2020-06-23: 102 mg via INTRAVENOUS
  Filled 2020-06-23: qty 17

## 2020-06-23 MED ORDER — DIPHENHYDRAMINE HCL 50 MG/ML IJ SOLN
25.0000 mg | Freq: Once | INTRAMUSCULAR | Status: AC
  Administered 2020-06-23: 25 mg via INTRAVENOUS

## 2020-06-23 MED ORDER — SODIUM CHLORIDE 0.9 % IV SOLN
10.0000 mg | Freq: Once | INTRAVENOUS | Status: AC
  Administered 2020-06-23: 10 mg via INTRAVENOUS
  Filled 2020-06-23: qty 10

## 2020-06-23 MED ORDER — HEPARIN SOD (PORK) LOCK FLUSH 100 UNIT/ML IV SOLN
500.0000 [IU] | Freq: Once | INTRAVENOUS | Status: AC | PRN
  Administered 2020-06-23: 500 [IU]
  Filled 2020-06-23: qty 5

## 2020-06-23 MED ORDER — FAMOTIDINE 20 MG IN NS 100 ML IVPB
INTRAVENOUS | Status: AC
  Filled 2020-06-23: qty 100

## 2020-06-23 MED ORDER — DIPHENHYDRAMINE HCL 50 MG/ML IJ SOLN
INTRAMUSCULAR | Status: AC
  Filled 2020-06-23: qty 1

## 2020-06-23 MED ORDER — SODIUM CHLORIDE 0.9% FLUSH
10.0000 mL | INTRAVENOUS | Status: DC | PRN
  Administered 2020-06-23: 10 mL
  Filled 2020-06-23: qty 10

## 2020-06-23 MED ORDER — SODIUM CHLORIDE 0.9 % IV SOLN
Freq: Once | INTRAVENOUS | Status: AC
  Filled 2020-06-23: qty 250

## 2020-06-23 NOTE — Patient Instructions (Signed)
[facility] ONCOLOGY  Discharge Instructions: Thank you for choosing [facility] to provide your oncology and hematology care.   If you have a lab appointment with the [facility], please go directly to the [facility] and check in at the registration area.   Wear comfortable clothing and clothing appropriate for easy access to any Portacath or PICC line.   We strive to give you quality time with your provider. You may need to reschedule your appointment if you arrive late (15 or more minutes).  Arriving late affects you and other patients whose appointments are after yours.  Also, if you miss three or more appointments without notifying the office, you may be dismissed from the clinic at the provider's discretion.      For prescription refill requests, have your pharmacy contact our office and allow 72 hours for refills to be completed.    Today you received the following chemotherapy and/or immunotherapy agents: Taxol.      To help prevent nausea and vomiting after your treatment, we encourage you to take your nausea medication as directed.  BELOW ARE SYMPTOMS THAT SHOULD BE REPORTED IMMEDIATELY: *FEVER GREATER THAN 100.4 F (38 C) OR HIGHER *CHILLS OR SWEATING *NAUSEA AND VOMITING THAT IS NOT CONTROLLED WITH YOUR NAUSEA MEDICATION *UNUSUAL SHORTNESS OF BREATH *UNUSUAL BRUISING OR BLEEDING *URINARY PROBLEMS (pain or burning when urinating, or frequent urination) *BOWEL PROBLEMS (unusual diarrhea, constipation, pain near the anus) TENDERNESS IN MOUTH AND THROAT WITH OR WITHOUT PRESENCE OF ULCERS (sore throat, sores in mouth, or a toothache) UNUSUAL RASH, SWELLING OR PAIN  UNUSUAL VAGINAL DISCHARGE OR ITCHING   Items with * indicate a potential emergency and should be followed up as soon as possible or go to the Emergency Department if any problems should occur.  Please show the CHEMOTHERAPY ALERT CARD or IMMUNOTHERAPY ALERT CARD at check-in to the  Emergency Department and triage nurse.  Should you have questions after your visit or need to cancel or reschedule your appointment, please contact [facility]  Dept: [phone]  and follow the prompts.  Office hours are 8:00 a.m. to 4:30 p.m. Monday - Friday. Please note that voicemails left after 4:00 p.m. may not be returned until the following business day.  We are closed weekends and major holidays. You have access to a nurse at all times for urgent questions. Please call the main number to the clinic Dept: [phone] and follow the prompts.   For any non-urgent questions, you may also contact your provider using MyChart. We now offer e-Visits for anyone 25 and older to request care online for non-urgent symptoms. For details visit [email or web address].   Also download the MyChart app! Go to the app store, search "MyChart", open the app, select [facility], and log in with your MyChart username and password.  Due to Covid, a mask is required upon entering the hospital/clinic. If you do not have a mask, one will be given to you upon arrival. For doctor visits, patients may have 1 support person aged 65 or older with them. For treatment visits, patients cannot have anyone with them due to current Covid guidelines and our immunocompromised population.

## 2020-06-23 NOTE — Assessment & Plan Note (Signed)
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  Treatment plan: 1. Neo- adjuvant chemotherapy with dose dense Adriamycin and Cytoxan/Keytrudafollowed by Taxol and carboplatin 2.bilateral mastectomieswith sentinel lymph node biopsy 3. Adjuvant radiation 4. followed by adjuvant antiestrogen therapy 5. Genetic testing _______________________________________________________________________ Current Treatment:Completed 4 cycles of neoadjuvant chemotherapy with Adriamycin/Cytoxan/Keytruda,today cycle5Taxol (carboplatin and Keytruda every 3 weeks)  Chemo toxicities: 1.Alopecia 2.fatigue: Stable 3.Chemotherapy-induced anemia: Hemoglobin10.4: Monitoring 4.Thrombocytopenia: Today she receives carboplatin so most likely her platelet counts might come down.  We will have to watch carefully. 5.Leukopenia/neutropenia:We are administering Granix injections prior to each chemo.   Return to clinic next week to assess blood counts and tolerability to chemo.

## 2020-06-29 ENCOUNTER — Other Ambulatory Visit: Payer: Self-pay

## 2020-06-29 ENCOUNTER — Inpatient Hospital Stay: Payer: 59

## 2020-06-29 ENCOUNTER — Ambulatory Visit: Payer: 59

## 2020-06-29 ENCOUNTER — Telehealth: Payer: Self-pay | Admitting: Hematology and Oncology

## 2020-06-29 VITALS — BP 113/77 | HR 78 | Resp 16

## 2020-06-29 DIAGNOSIS — C50411 Malignant neoplasm of upper-outer quadrant of right female breast: Secondary | ICD-10-CM

## 2020-06-29 DIAGNOSIS — Z170 Estrogen receptor positive status [ER+]: Secondary | ICD-10-CM

## 2020-06-29 DIAGNOSIS — R110 Nausea: Secondary | ICD-10-CM | POA: Diagnosis not present

## 2020-06-29 DIAGNOSIS — D6959 Other secondary thrombocytopenia: Secondary | ICD-10-CM | POA: Diagnosis not present

## 2020-06-29 DIAGNOSIS — R5383 Other fatigue: Secondary | ICD-10-CM | POA: Diagnosis not present

## 2020-06-29 DIAGNOSIS — Z5112 Encounter for antineoplastic immunotherapy: Secondary | ICD-10-CM | POA: Diagnosis not present

## 2020-06-29 DIAGNOSIS — L658 Other specified nonscarring hair loss: Secondary | ICD-10-CM | POA: Diagnosis not present

## 2020-06-29 DIAGNOSIS — Z5111 Encounter for antineoplastic chemotherapy: Secondary | ICD-10-CM | POA: Diagnosis not present

## 2020-06-29 DIAGNOSIS — D6481 Anemia due to antineoplastic chemotherapy: Secondary | ICD-10-CM | POA: Diagnosis not present

## 2020-06-29 DIAGNOSIS — Z95828 Presence of other vascular implants and grafts: Secondary | ICD-10-CM

## 2020-06-29 MED ORDER — FILGRASTIM-SNDZ 300 MCG/0.5ML IJ SOSY
300.0000 ug | PREFILLED_SYRINGE | Freq: Once | INTRAMUSCULAR | Status: AC
  Administered 2020-06-29: 300 ug via SUBCUTANEOUS

## 2020-06-29 MED ORDER — FILGRASTIM-SNDZ 300 MCG/0.5ML IJ SOSY
PREFILLED_SYRINGE | INTRAMUSCULAR | Status: AC
  Filled 2020-06-29: qty 0.5

## 2020-06-29 NOTE — Telephone Encounter (Signed)
Scheduled appts per 6/20 sch msg. Will have updated calendar printed for pt at next visit.  

## 2020-06-29 NOTE — Patient Instructions (Signed)
Filgrastim, G-CSF injection What is this medication? FILGRASTIM, G-CSF (fil GRA stim) is a granulocyte colony-stimulating factor that stimulates the growth of neutrophils, a type of white blood cell (WBC) important in the body's fight against infection. It is used to reduce the incidence of fever and infection in patients with certain types of cancer who are receiving chemotherapy that affects the bone marrow, to stimulate blood cell production for removal of WBCs from the body prior to a bone marrow transplantation, to reduce the incidence of fever and infection in patients who have severe chronic neutropenia, and to improve survival outcomes following high-dose radiation exposure that is toxic to the bone marrow. This medicine may be used for other purposes; ask your health care provider or pharmacist if you have questions. COMMON BRAND NAME(S): Neupogen, Nivestym, Releuko, Zarxio What should I tell my care team before I take this medication? They need to know if you have any of these conditions: kidney disease latex allergy ongoing radiation therapy sickle cell disease an unusual or allergic reaction to filgrastim, pegfilgrastim, other medicines, foods, dyes, or preservatives pregnant or trying to get pregnant breast-feeding How should I use this medication? This medicine is for injection under the skin or infusion into a vein. As an infusion into a vein, it is usually given by a health care professional in a hospital or clinic setting. If you get this medicine at home, you will be taught how to prepare and give this medicine. Refer to the Instructions for Use that come with your medication packaging. Use exactly as directed. Take your medicine at regular intervals. Do not take your medicine more often than directed. It is important that you put your used needles and syringes in a special sharps container. Do not put them in a trash can. If you do not have a sharps container, call your pharmacist  or healthcare provider to get one. Talk to your pediatrician regarding the use of this medicine in children. While this drug may be prescribed for children as young as 7 months for selected conditions, precautions do apply. Overdosage: If you think you have taken too much of this medicine contact a poison control center or emergency room at once. NOTE: This medicine is only for you. Do not share this medicine with others. What if I miss a dose? It is important not to miss your dose. Call your doctor or health care professional if you miss a dose. What may interact with this medication? This medicine may interact with the following medications: medicines that may cause a release of neutrophils, such as lithium This list may not describe all possible interactions. Give your health care provider a list of all the medicines, herbs, non-prescription drugs, or dietary supplements you use. Also tell them if you smoke, drink alcohol, or use illegal drugs. Some items may interact with your medicine. What should I watch for while using this medication? Your condition will be monitored carefully while you are receiving this medicine. You may need blood work done while you are taking this medicine. Talk to your health care provider about your risk of cancer. You may be more at risk for certain types of cancer if you take this medicine. What side effects may I notice from receiving this medication? Side effects that you should report to your doctor or health care professional as soon as possible: allergic reactions like skin rash, itching or hives, swelling of the face, lips, or tongue back pain dizziness or feeling faint fever pain, redness, or   irritation at site where injected pinpoint red spots on the skin shortness of breath or breathing problems signs and symptoms of kidney injury like trouble passing urine, change in the amount of urine, or red or dark-brown urine stomach or side pain, or pain at  the shoulder swelling tiredness unusual bleeding or bruising Side effects that usually do not require medical attention (report to your doctor or health care professional if they continue or are bothersome): bone pain cough diarrhea hair loss headache muscle pain This list may not describe all possible side effects. Call your doctor for medical advice about side effects. You may report side effects to FDA at 1-800-FDA-1088. Where should I keep my medication? Keep out of the reach of children. Store in a refrigerator between 2 and 8 degrees C (36 and 46 degrees F). Do not freeze. Keep in carton to protect from light. Throw away this medicine if vials or syringes are left out of the refrigerator for more than 24 hours. Throw away any unused medicine after the expiration date. NOTE: This sheet is a summary. It may not cover all possible information. If you have questions about this medicine, talk to your doctor, pharmacist, or health care provider.  2022 Elsevier/Gold Standard (2019-01-17 18:47:55)  

## 2020-06-30 ENCOUNTER — Other Ambulatory Visit: Payer: 59

## 2020-06-30 ENCOUNTER — Ambulatory Visit: Payer: 59 | Admitting: Physician Assistant

## 2020-06-30 ENCOUNTER — Ambulatory Visit: Payer: 59

## 2020-06-30 ENCOUNTER — Inpatient Hospital Stay: Payer: 59

## 2020-06-30 VITALS — BP 114/81 | HR 76 | Temp 98.4°F | Resp 15

## 2020-06-30 DIAGNOSIS — D6481 Anemia due to antineoplastic chemotherapy: Secondary | ICD-10-CM | POA: Diagnosis not present

## 2020-06-30 DIAGNOSIS — Z5111 Encounter for antineoplastic chemotherapy: Secondary | ICD-10-CM | POA: Diagnosis not present

## 2020-06-30 DIAGNOSIS — D6959 Other secondary thrombocytopenia: Secondary | ICD-10-CM | POA: Diagnosis not present

## 2020-06-30 DIAGNOSIS — Z170 Estrogen receptor positive status [ER+]: Secondary | ICD-10-CM | POA: Diagnosis not present

## 2020-06-30 DIAGNOSIS — R5383 Other fatigue: Secondary | ICD-10-CM | POA: Diagnosis not present

## 2020-06-30 DIAGNOSIS — C50411 Malignant neoplasm of upper-outer quadrant of right female breast: Secondary | ICD-10-CM

## 2020-06-30 DIAGNOSIS — R110 Nausea: Secondary | ICD-10-CM | POA: Diagnosis not present

## 2020-06-30 DIAGNOSIS — L658 Other specified nonscarring hair loss: Secondary | ICD-10-CM | POA: Diagnosis not present

## 2020-06-30 DIAGNOSIS — Z5112 Encounter for antineoplastic immunotherapy: Secondary | ICD-10-CM | POA: Diagnosis not present

## 2020-06-30 DIAGNOSIS — Z95828 Presence of other vascular implants and grafts: Secondary | ICD-10-CM

## 2020-06-30 MED ORDER — FILGRASTIM-SNDZ 300 MCG/0.5ML IJ SOSY
PREFILLED_SYRINGE | INTRAMUSCULAR | Status: AC
  Filled 2020-06-30: qty 0.5

## 2020-06-30 MED ORDER — FILGRASTIM-SNDZ 300 MCG/0.5ML IJ SOSY
300.0000 ug | PREFILLED_SYRINGE | Freq: Once | INTRAMUSCULAR | Status: AC
  Administered 2020-06-30: 300 ug via SUBCUTANEOUS

## 2020-06-30 NOTE — Patient Instructions (Signed)
Filgrastim, G-CSF injection What is this medication? FILGRASTIM, G-CSF (fil GRA stim) is a granulocyte colony-stimulating factor that stimulates the growth of neutrophils, a type of white blood cell (WBC) important in the body's fight against infection. It is used to reduce the incidence of fever and infection in patients with certain types of cancer who are receiving chemotherapy that affects the bone marrow, to stimulate blood cell production for removal of WBCs from the body prior to a bone marrow transplantation, to reduce the incidence of fever and infection in patients who have severe chronic neutropenia, and to improve survival outcomes following high-dose radiation exposure that is toxic to the bone marrow. This medicine may be used for other purposes; ask your health care provider or pharmacist if you have questions. COMMON BRAND NAME(S): Neupogen, Nivestym, Releuko, Zarxio What should I tell my care team before I take this medication? They need to know if you have any of these conditions: kidney disease latex allergy ongoing radiation therapy sickle cell disease an unusual or allergic reaction to filgrastim, pegfilgrastim, other medicines, foods, dyes, or preservatives pregnant or trying to get pregnant breast-feeding How should I use this medication? This medicine is for injection under the skin or infusion into a vein. As an infusion into a vein, it is usually given by a health care professional in a hospital or clinic setting. If you get this medicine at home, you will be taught how to prepare and give this medicine. Refer to the Instructions for Use that come with your medication packaging. Use exactly as directed. Take your medicine at regular intervals. Do not take your medicine more often than directed. It is important that you put your used needles and syringes in a special sharps container. Do not put them in a trash can. If you do not have a sharps container, call your pharmacist  or healthcare provider to get one. Talk to your pediatrician regarding the use of this medicine in children. While this drug may be prescribed for children as young as 7 months for selected conditions, precautions do apply. Overdosage: If you think you have taken too much of this medicine contact a poison control center or emergency room at once. NOTE: This medicine is only for you. Do not share this medicine with others. What if I miss a dose? It is important not to miss your dose. Call your doctor or health care professional if you miss a dose. What may interact with this medication? This medicine may interact with the following medications: medicines that may cause a release of neutrophils, such as lithium This list may not describe all possible interactions. Give your health care provider a list of all the medicines, herbs, non-prescription drugs, or dietary supplements you use. Also tell them if you smoke, drink alcohol, or use illegal drugs. Some items may interact with your medicine. What should I watch for while using this medication? Your condition will be monitored carefully while you are receiving this medicine. You may need blood work done while you are taking this medicine. Talk to your health care provider about your risk of cancer. You may be more at risk for certain types of cancer if you take this medicine. What side effects may I notice from receiving this medication? Side effects that you should report to your doctor or health care professional as soon as possible: allergic reactions like skin rash, itching or hives, swelling of the face, lips, or tongue back pain dizziness or feeling faint fever pain, redness, or   irritation at site where injected pinpoint red spots on the skin shortness of breath or breathing problems signs and symptoms of kidney injury like trouble passing urine, change in the amount of urine, or red or dark-brown urine stomach or side pain, or pain at  the shoulder swelling tiredness unusual bleeding or bruising Side effects that usually do not require medical attention (report to your doctor or health care professional if they continue or are bothersome): bone pain cough diarrhea hair loss headache muscle pain This list may not describe all possible side effects. Call your doctor for medical advice about side effects. You may report side effects to FDA at 1-800-FDA-1088. Where should I keep my medication? Keep out of the reach of children. Store in a refrigerator between 2 and 8 degrees C (36 and 46 degrees F). Do not freeze. Keep in carton to protect from light. Throw away this medicine if vials or syringes are left out of the refrigerator for more than 24 hours. Throw away any unused medicine after the expiration date. NOTE: This sheet is a summary. It may not cover all possible information. If you have questions about this medicine, talk to your doctor, pharmacist, or health care provider.  2022 Elsevier/Gold Standard (2019-01-17 18:47:55)  

## 2020-06-30 NOTE — Assessment & Plan Note (Signed)
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  Treatment plan: 1. Neo- adjuvant chemotherapy with dose dense Adriamycin and Cytoxan/Keytrudafollowed by Taxol and carboplatin 2.bilateral mastectomieswith sentinel lymph node biopsy 3. Adjuvant radiation 4. followed by adjuvant antiestrogen therapy 5. Genetic testing _______________________________________________________________________ Current Treatment:Completed 4 cycles of neoadjuvant chemotherapy with Adriamycin/Cytoxan/Keytruda,today cycle5Taxol (carboplatin and Keytruda every 3 weeks)  Chemo toxicities: 1.Alopecia 2.fatigue: Stable 3.Chemotherapy-induced anemia: Hemoglobin10.3: Monitoring 4.Thrombocytopenia:Today's platelet count is 254. 5.Leukopenia/neutropenia:We are administering Granix (2 injections) prior to each chemo.   She is planning to go to the beach after today's treatment or [place].  I discussed with her about avoiding direct sun exposure for long periods of time. Return to clinic next week to assess blood counts and tolerability to chemo.  

## 2020-06-30 NOTE — Progress Notes (Signed)
Patient Care Team: Emeterio Reeve, DO as PCP - General (Osteopathic Medicine) [name], MD as Consulting Physician (Obstetrics and Gynecology) Mauro Kaufmann, RN as Oncology Nurse Navigator Rockwell Germany, RN as Oncology Nurse Navigator  DIAGNOSIS:    ICD-10-CM   1. Malignant neoplasm of upper-outer quadrant of right breast in female, estrogen receptor positive ([place])  C50.411 DISCONTINUED: ondansetron (ZOFRAN) 8 mg in sodium chloride 0.9 % 50 mL IVPB   Z17.0       SUMMARY OF ONCOLOGIC HISTORY: Oncology History  Malignant neoplasm of upper outer quadrant of female breast ([place])  01/28/2020 Initial Diagnosis   Patient palpated a right breast lump. Diagnostic mammogram and US showed calcifications spanning 2.0cm in the right breast. Biopsy showed invasive and in situ carcinoma, grade 2. ER 5% weak, PR 0% negative, HER2 equiv, Ki 10%     01/28/2020 Cancer Staging   Staging form: Breast, AJCC 8th Edition - Clinical stage from 01/28/2020: Stage IA (cT1c, cN0, cM0, G2, ER+, PR-, HER2: Equivocal) - Signed by Nicholas Lose, MD on 01/29/2020    02/05/2020 -  Chemotherapy    Patient is on Treatment Plan: BREAST DOSE DENSE AC Q14D / CARBOPLATIN D1 + PACLITAXEL D1,8,15 Q21D       02/13/2020 Genetic Testing   Positive genetic testing:  A single, heterozygous, pathogenic variant was detected in the BRCA2 gene called [id number]. Testing was completed through the CustomNext-Cancer + RNAinsight panel offered by [facility]. 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 The report date is 02/13/2020.  The CustomNext-Cancer+RNAinsight panel offered by [facility] includes sequencing and rearrangement analysis for the following 47 genes:  APC, ATM, AXIN2, BARD1, BMPR1A, BRCA1, BRCA2, BRIP1, CDH1, CDK4, CDKN2A, CHEK2, DICER1, EPCAM, GREM1, HOXB13, MEN1, MLH1, MSH2, MSH3, MSH6, MUTYH, NBN, NF1, NF2, NTHL1,  PALB2, PMS2, POLD1, POLE, PTEN, RAD51C, RAD51D, RECQL, RET, SDHA, SDHAF2, SDHB, SDHC, SDHD, SMAD4, SMARCA4, STK11, TP53, TSC1, TSC2, and VHL.  RNA data is routinely analyzed for use in variant interpretation for all genes.     CHIEF COMPLIANT: Day 15, Cycle 6 Taxol (Keytruda and Carboplatin every 3 weeks)  INTERVAL HISTORY: Sydney Herman is a 40 y.o. with above-mentioned history of right cancer currently on neoadjuvant chemotherapy with Taxol, Keytruda, and Carboplatin after completing 4 cycles of Adriamycin/Cytoxan/Pembrolizumab. She presents to the clinic today for a toxicity check and cycle 6.  She had constipation and which led to hemorrhoidal bleeding.  ALLERGIES:  is allergic to amoxil [amoxicillin].  MEDICATIONS:  Current Outpatient Medications  Medication Sig Dispense Refill   cycloSPORINE (RESTASIS) 0.05 % ophthalmic emulsion INSILL 1 DROP INTO BOTH EYES TWICE DAILY 180 mL 4   doxycycline (VIBRA-TABS) 100 MG tablet TAKE 1 TABLET BY MOUTH 2 TIMES DAILY FOR 7 DAYS 14 tablet 0   escitalopram (LEXAPRO) 10 MG tablet Take 1 tablet (10 mg total) by mouth daily. 90 tablet 3   lidocaine-prilocaine (EMLA) cream APPLY TO AFFECTED AREA ONCE 30 g 3   loratadine (CLARITIN) 10 MG tablet 10 mg.     LORazepam (ATIVAN) 0.5 MG tablet TAKE 1 TABLET BY MOUTH AT BEDTIME AS NEEDED FOR NAUSEA OR VOMITING 30 tablet 0   ondansetron (ZOFRAN) 8 MG tablet TAKE 1 TABLET BY MOUTH 2 TIMES DAILY AS NEEDED START ON THE THIRD DAY AFTER CHEMO 30 tablet 1   prochlorperazine (COMPAZINE) 10 MG tablet TAKE 1 TABLET BY MOUTH EVERY 6 HOURS AS NEEDED FOR NAUSEA OR VOMITING 30  tablet 1   RESTASIS 0.05 % ophthalmic emulsion Place 1 drop into the right eye 2 (two) times daily.     No current facility-administered medications for this visit.   Facility-Administered Medications Ordered in Other Visits  Medication Dose Route Frequency Provider Last Rate Last Admin   heparin lock flush 100 unit/mL  500 Units Intracatheter Once  PRN Nicholas Lose, MD       PACLitaxel (TAXOL) 102 mg in sodium chloride 0.9 % 250 mL chemo infusion (</= 36m/m2)  65 mg/m2 (Treatment Plan Recorded) Intravenous Once GNicholas Lose MD       sodium chloride flush (NS) 0.9 % injection 10 mL  10 mL Intracatheter PRN GNicholas Lose MD        PHYSICAL EXAMINATION: ECOG PERFORMANCE STATUS: 1 - Symptomatic but completely ambulatory  Vitals:   07/01/20 0906  BP: 103/86  Pulse: 83  Resp: 18  Temp: 97.9 F (36.6 C)  SpO2: 100%   Filed Weights   07/01/20 0906  Weight: 121 lb 8 oz (55.1 kg)     LABORATORY DATA:  I have reviewed the data as listed CMP Latest Ref Rng & Units 07/01/2020 06/23/2020 06/16/2020  Glucose 70 - 99 mg/dL 85 97 93  BUN 6 - 20 mg/dL '8 10 7  ' Creatinine 0.44 - 1.00 mg/dL 0.69 0.68 0.68  Sodium 135 - 145 mmol/L 136 139 138  Potassium 3.5 - 5.1 mmol/L 3.8 3.8 3.8  Chloride 98 - 111 mmol/L 102 103 102  CO2 22 - 32 mmol/L '26 29 26  ' Calcium 8.9 - 10.3 mg/dL 9.4 9.4 9.7  Total Protein 6.5 - 8.1 g/dL 7.4 7.4 7.9  Total Bilirubin 0.3 - 1.2 mg/dL <0.2(L) 0.4 0.3  Alkaline Phos 38 - 126 U/L 87 78 78  AST 15 - 41 U/L 27 29 35  ALT 0 - 44 U/L '30 28 27    ' Lab Results  Component Value Date   WBC 23.6 (H) 07/01/2020   HGB 9.8 (L) 07/01/2020   HCT 28.4 (L) 07/01/2020   MCV 95.6 07/01/2020   PLT 188 07/01/2020   NEUTROABS 19.6 (H) 07/01/2020    ASSESSMENT & PLAN:  Malignant neoplasm of upper outer quadrant of female breast ([place] 01/28/2020: Palpable right breast lump: Mammogram and ultrasound revealed calcifications spanning 2 cm, biopsy revealed IDC with DCIS, grade 2, 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     Treatment plan: 1. Neo- adjuvant chemotherapy with dose dense Adriamycin and Cytoxan/Keytruda followed by Taxol and  carboplatin 2. bilateral mastectomies with sentinel lymph node biopsy 3. Adjuvant radiation 4. followed by adjuvant antiestrogen therapy 5.  Genetic testing _______________________________________________________________________ Current Treatment: Completed 4 cycles of neoadjuvant chemotherapy with Adriamycin/Cytoxan/Keytruda, today cycle 6 Taxol (carboplatin and Keytruda every 3 weeks)   Chemo toxicities: 1.  Alopecia 2. fatigue: Stable 3.  Chemotherapy-induced anemia: Hemoglobin 9.8: Monitoring 4.  Thrombocytopenia: Today's platelet count is 188 5.  Leukopenia/neutropenia: We are administering Granix (2 injections) prior to each chemo.  Because of this her white count went up to 23.6.  Next week she will get all 3 drugs and therefore it is expected that they may come down after the treatment.  We will continue with same plan of 2 Granix injections.   She had constipation which later led to hemorrhoidal bleeding.  Denies any peripheral  neuropathy. Monitoring closely for toxicities.  No orders of the defined types were placed in this encounter.  The patient has a good understanding of the overall plan. she agrees with it. she will call with any problems that may develop before the next visit here.  Total time spent: 30 mins including face to face time and time spent for planning, charting and coordination of care  Rulon Eisenmenger, MD, MPH 07/01/2020  I, Thana Ates, am acting as scribe for Dr. Nicholas Lose.  I have reviewed the above documentation for accuracy and completeness, and I agree with the above.

## 2020-07-01 ENCOUNTER — Other Ambulatory Visit: Payer: Self-pay

## 2020-07-01 ENCOUNTER — Inpatient Hospital Stay (HOSPITAL_BASED_OUTPATIENT_CLINIC_OR_DEPARTMENT_OTHER): Payer: 59 | Admitting: Hematology and Oncology

## 2020-07-01 ENCOUNTER — Ambulatory Visit: Payer: 59 | Admitting: Physician Assistant

## 2020-07-01 ENCOUNTER — Inpatient Hospital Stay: Payer: 59

## 2020-07-01 VITALS — BP 103/86 | HR 83 | Temp 97.9°F | Resp 18 | Ht 61.0 in | Wt 121.5 lb

## 2020-07-01 DIAGNOSIS — D6959 Other secondary thrombocytopenia: Secondary | ICD-10-CM | POA: Diagnosis not present

## 2020-07-01 DIAGNOSIS — C50411 Malignant neoplasm of upper-outer quadrant of right female breast: Secondary | ICD-10-CM

## 2020-07-01 DIAGNOSIS — D6481 Anemia due to antineoplastic chemotherapy: Secondary | ICD-10-CM | POA: Diagnosis not present

## 2020-07-01 DIAGNOSIS — Z5111 Encounter for antineoplastic chemotherapy: Secondary | ICD-10-CM | POA: Diagnosis not present

## 2020-07-01 DIAGNOSIS — Z5112 Encounter for antineoplastic immunotherapy: Secondary | ICD-10-CM | POA: Diagnosis not present

## 2020-07-01 DIAGNOSIS — Z170 Estrogen receptor positive status [ER+]: Secondary | ICD-10-CM

## 2020-07-01 DIAGNOSIS — R110 Nausea: Secondary | ICD-10-CM | POA: Diagnosis not present

## 2020-07-01 DIAGNOSIS — L658 Other specified nonscarring hair loss: Secondary | ICD-10-CM | POA: Diagnosis not present

## 2020-07-01 DIAGNOSIS — R5383 Other fatigue: Secondary | ICD-10-CM | POA: Diagnosis not present

## 2020-07-01 LAB — CBC WITH DIFFERENTIAL (CANCER CENTER ONLY)
Abs Immature Granulocytes: 0 10*3/uL (ref 0.00–0.07)
Band Neutrophils: 10 %
Basophils Absolute: 0 10*3/uL (ref 0.0–0.1)
Basophils Relative: 0 %
Eosinophils Absolute: 0 10*3/uL (ref 0.0–0.5)
Eosinophils Relative: 0 %
HCT: 28.4 % — ABNORMAL LOW (ref 36.0–46.0)
Hemoglobin: 9.8 g/dL — ABNORMAL LOW (ref 12.0–15.0)
Lymphocytes Relative: 11 %
Lymphs Abs: 2.6 10*3/uL (ref 0.7–4.0)
MCH: 33 pg (ref 26.0–34.0)
MCHC: 34.5 g/dL (ref 30.0–36.0)
MCV: 95.6 fL (ref 80.0–100.0)
Monocytes Absolute: 1.4 10*3/uL — ABNORMAL HIGH (ref 0.1–1.0)
Monocytes Relative: 6 %
Neutro Abs: 19.6 10*3/uL — ABNORMAL HIGH (ref 1.7–7.7)
Neutrophils Relative %: 73 %
Platelet Count: 188 10*3/uL (ref 150–400)
RBC: 2.97 MIL/uL — ABNORMAL LOW (ref 3.87–5.11)
RDW: 14.6 % (ref 11.5–15.5)
WBC Count: 23.6 10*3/uL — ABNORMAL HIGH (ref 4.0–10.5)
nRBC: 0 % (ref 0.0–0.2)

## 2020-07-01 LAB — CMP (CANCER CENTER ONLY)
ALT: 30 U/L (ref 0–44)
AST: 27 U/L (ref 15–41)
Albumin: 4.1 g/dL (ref 3.5–5.0)
Alkaline Phosphatase: 87 U/L (ref 38–126)
Anion gap: 8 (ref 5–15)
BUN: 8 mg/dL (ref 6–20)
CO2: 26 mmol/L (ref 22–32)
Calcium: 9.4 mg/dL (ref 8.9–10.3)
Chloride: 102 mmol/L (ref 98–111)
Creatinine: 0.69 mg/dL (ref 0.44–1.00)
GFR, Estimated: >60 mL/min (ref >60)
Glucose, Bld: 85 mg/dL (ref 70–99)
Potassium: 3.8 mmol/L (ref 3.5–5.1)
Sodium: 136 mmol/L (ref 135–145)
Total Bilirubin: <0.2 mg/dL — ABNORMAL LOW (ref 0.3–1.2)
Total Protein: 7.4 g/dL (ref 6.5–8.1)

## 2020-07-01 LAB — TSH: TSH: 0.752 u[IU]/mL (ref 0.308–3.960)

## 2020-07-01 MED ORDER — SODIUM CHLORIDE 0.9 % IV SOLN: 8.0000 mg | Freq: Once | INTRAVENOUS | Status: DC

## 2020-07-01 MED ORDER — SODIUM CHLORIDE 0.9 % IV SOLN
Freq: Once | INTRAVENOUS | Status: AC
  Filled 2020-07-01: qty 250

## 2020-07-01 MED ORDER — FAMOTIDINE 20 MG IN NS 100 ML IVPB
INTRAVENOUS | Status: AC
  Filled 2020-07-01: qty 100

## 2020-07-01 MED ORDER — ONDANSETRON HCL 4 MG/2ML IJ SOLN
8.0000 mg | Freq: Once | INTRAMUSCULAR | Status: AC
Start: 2020-07-01 — End: 2020-07-01
  Administered 2020-07-01: 8 mg via INTRAVENOUS

## 2020-07-01 MED ORDER — SODIUM CHLORIDE 0.9% FLUSH
10.0000 mL | INTRAVENOUS | Status: DC | PRN
  Administered 2020-07-01: 10 mL
  Filled 2020-07-01: qty 10

## 2020-07-01 MED ORDER — SODIUM CHLORIDE 0.9 % IV SOLN
10.0000 mg | Freq: Once | INTRAVENOUS | Status: AC
  Administered 2020-07-01: 10 mg via INTRAVENOUS
  Filled 2020-07-01: qty 10

## 2020-07-01 MED ORDER — DIPHENHYDRAMINE HCL 50 MG/ML IJ SOLN
INTRAMUSCULAR | Status: AC
  Filled 2020-07-01: qty 1

## 2020-07-01 MED ORDER — SODIUM CHLORIDE 0.9 % IV SOLN
65.0000 mg/m2 | Freq: Once | INTRAVENOUS | Status: AC
  Administered 2020-07-01: 102 mg via INTRAVENOUS
  Filled 2020-07-01: qty 17

## 2020-07-01 MED ORDER — ONDANSETRON HCL 4 MG/2ML IJ SOLN
INTRAMUSCULAR | Status: AC
  Filled 2020-07-01: qty 4

## 2020-07-01 MED ORDER — HEPARIN SOD (PORK) LOCK FLUSH 100 UNIT/ML IV SOLN
500.0000 [IU] | Freq: Once | INTRAVENOUS | Status: AC | PRN
  Administered 2020-07-01: 500 [IU]
  Filled 2020-07-01: qty 5

## 2020-07-01 MED ORDER — DIPHENHYDRAMINE HCL 50 MG/ML IJ SOLN
25.0000 mg | Freq: Once | INTRAMUSCULAR | Status: AC
  Administered 2020-07-01: 25 mg via INTRAVENOUS

## 2020-07-01 MED ORDER — FAMOTIDINE 20 MG IN NS 100 ML IVPB
20.0000 mg | Freq: Once | INTRAVENOUS | Status: AC
Start: 2020-07-01 — End: 2020-07-01
  Administered 2020-07-01: 20 mg via INTRAVENOUS

## 2020-07-01 NOTE — Patient Instructions (Signed)
[facility] ONCOLOGY   Discharge Instructions: Thank you for choosing [facility] to provide your oncology and hematology care.   If you have a lab appointment with the [facility], please go directly to the [facility] and check in at the registration area.   Wear comfortable clothing and clothing appropriate for easy access to any Portacath or PICC line.   We strive to give you quality time with your provider. You may need to reschedule your appointment if you arrive late (15 or more minutes).  Arriving late affects you and other patients whose appointments are after yours.  Also, if you miss three or more appointments without notifying the office, you may be dismissed from the clinic at the provider's discretion.      For prescription refill requests, have your pharmacy contact our office and allow 72 hours for refills to be completed.    Today you received the following chemotherapy and/or immunotherapy agents: Paclitaxel (Taxol)      To help prevent nausea and vomiting after your treatment, we encourage you to take your nausea medication as directed.  BELOW ARE SYMPTOMS THAT SHOULD BE REPORTED IMMEDIATELY: *FEVER GREATER THAN 100.4 F (38 C) OR HIGHER *CHILLS OR SWEATING *NAUSEA AND VOMITING THAT IS NOT CONTROLLED WITH YOUR NAUSEA MEDICATION *UNUSUAL SHORTNESS OF BREATH *UNUSUAL BRUISING OR BLEEDING *URINARY PROBLEMS (pain or burning when urinating, or frequent urination) *BOWEL PROBLEMS (unusual diarrhea, constipation, pain near the anus) TENDERNESS IN MOUTH AND THROAT WITH OR WITHOUT PRESENCE OF ULCERS (sore throat, sores in mouth, or a toothache) UNUSUAL RASH, SWELLING OR PAIN  UNUSUAL VAGINAL DISCHARGE OR ITCHING   Items with * indicate a potential emergency and should be followed up as soon as possible or go to the Emergency Department if any problems should occur.  Please show the CHEMOTHERAPY ALERT CARD or IMMUNOTHERAPY ALERT CARD at  check-in to the Emergency Department and triage nurse.  Should you have questions after your visit or need to cancel or reschedule your appointment, please contact [facility]  Dept: [phone]  and follow the prompts.  Office hours are 8:00 a.m. to 4:30 p.m. Monday - Friday. Please note that voicemails left after 4:00 p.m. may not be returned until the following business day.  We are closed weekends and major holidays. You have access to a nurse at all times for urgent questions. Please call the main number to the clinic Dept: [phone] and follow the prompts.   For any non-urgent questions, you may also contact your provider using MyChart. We now offer e-Visits for anyone 27 and older to request care online for non-urgent symptoms. For details visit [email or web address].   Also download the MyChart app! Go to the app store, search "MyChart", open the app, select [facility], and log in with your MyChart username and password.  Due to Covid, a mask is required upon entering the hospital/clinic. If you do not have a mask, one will be given to you upon arrival. For doctor visits, patients may have 1 support person aged 46 or older with them. For treatment visits, patients cannot have anyone with them due to current Covid guidelines and our immunocompromised population.

## 2020-07-01 NOTE — Progress Notes (Signed)
OK to proceed with treatment without pregnancy test per Dr Lindi Adie

## 2020-07-03 ENCOUNTER — Other Ambulatory Visit: Payer: Self-pay

## 2020-07-03 ENCOUNTER — Encounter: Payer: Self-pay | Admitting: *Deleted

## 2020-07-03 ENCOUNTER — Inpatient Hospital Stay: Payer: 59

## 2020-07-03 VITALS — BP 108/73 | HR 72 | Temp 98.0°F | Resp 16

## 2020-07-03 DIAGNOSIS — C50411 Malignant neoplasm of upper-outer quadrant of right female breast: Secondary | ICD-10-CM | POA: Diagnosis not present

## 2020-07-03 DIAGNOSIS — R110 Nausea: Secondary | ICD-10-CM | POA: Diagnosis not present

## 2020-07-03 DIAGNOSIS — D6481 Anemia due to antineoplastic chemotherapy: Secondary | ICD-10-CM | POA: Diagnosis not present

## 2020-07-03 DIAGNOSIS — Z170 Estrogen receptor positive status [ER+]: Secondary | ICD-10-CM

## 2020-07-03 DIAGNOSIS — L658 Other specified nonscarring hair loss: Secondary | ICD-10-CM | POA: Diagnosis not present

## 2020-07-03 DIAGNOSIS — D6959 Other secondary thrombocytopenia: Secondary | ICD-10-CM | POA: Diagnosis not present

## 2020-07-03 DIAGNOSIS — Z5112 Encounter for antineoplastic immunotherapy: Secondary | ICD-10-CM | POA: Diagnosis not present

## 2020-07-03 DIAGNOSIS — Z95828 Presence of other vascular implants and grafts: Secondary | ICD-10-CM

## 2020-07-03 DIAGNOSIS — R5383 Other fatigue: Secondary | ICD-10-CM | POA: Diagnosis not present

## 2020-07-03 DIAGNOSIS — Z5111 Encounter for antineoplastic chemotherapy: Secondary | ICD-10-CM | POA: Diagnosis not present

## 2020-07-03 MED ORDER — FILGRASTIM-SNDZ 300 MCG/0.5ML IJ SOSY
300.0000 ug | PREFILLED_SYRINGE | Freq: Once | INTRAMUSCULAR | Status: AC
  Administered 2020-07-03: 300 ug via SUBCUTANEOUS

## 2020-07-03 MED ORDER — FILGRASTIM-SNDZ 300 MCG/0.5ML IJ SOSY
PREFILLED_SYRINGE | INTRAMUSCULAR | Status: AC
  Filled 2020-07-03: qty 0.5

## 2020-07-03 NOTE — Patient Instructions (Signed)
Filgrastim, G-CSF injection What is this medication? FILGRASTIM, G-CSF (fil GRA stim) is a granulocyte colony-stimulating factor that stimulates the growth of neutrophils, a type of white blood cell (WBC) important in the body's fight against infection. It is used to reduce the incidence of fever and infection in patients with certain types of cancer who are receiving chemotherapy that affects the bone marrow, to stimulate blood cell production for removal of WBCs from the body prior to a bone marrow transplantation, to reduce the incidence of fever and infection in patients who have severe chronic neutropenia, and to improve survival outcomes following high-dose radiation exposure that is toxic to the bone marrow. This medicine may be used for other purposes; ask your health care provider or pharmacist if you have questions. COMMON BRAND NAME(S): Neupogen, Nivestym, Releuko, Zarxio What should I tell my care team before I take this medication? They need to know if you have any of these conditions: kidney disease latex allergy ongoing radiation therapy sickle cell disease an unusual or allergic reaction to filgrastim, pegfilgrastim, other medicines, foods, dyes, or preservatives pregnant or trying to get pregnant breast-feeding How should I use this medication? This medicine is for injection under the skin or infusion into a vein. As an infusion into a vein, it is usually given by a health care professional in a hospital or clinic setting. If you get this medicine at home, you will be taught how to prepare and give this medicine. Refer to the Instructions for Use that come with your medication packaging. Use exactly as directed. Take your medicine at regular intervals. Do not take your medicine more often than directed. It is important that you put your used needles and syringes in a special sharps container. Do not put them in a trash can. If you do not have a sharps container, call your pharmacist  or healthcare provider to get one. Talk to your pediatrician regarding the use of this medicine in children. While this drug may be prescribed for children as young as 7 months for selected conditions, precautions do apply. Overdosage: If you think you have taken too much of this medicine contact a poison control center or emergency room at once. NOTE: This medicine is only for you. Do not share this medicine with others. What if I miss a dose? It is important not to miss your dose. Call your doctor or health care professional if you miss a dose. What may interact with this medication? This medicine may interact with the following medications: medicines that may cause a release of neutrophils, such as lithium This list may not describe all possible interactions. Give your health care provider a list of all the medicines, herbs, non-prescription drugs, or dietary supplements you use. Also tell them if you smoke, drink alcohol, or use illegal drugs. Some items may interact with your medicine. What should I watch for while using this medication? Your condition will be monitored carefully while you are receiving this medicine. You may need blood work done while you are taking this medicine. Talk to your health care provider about your risk of cancer. You may be more at risk for certain types of cancer if you take this medicine. What side effects may I notice from receiving this medication? Side effects that you should report to your doctor or health care professional as soon as possible: allergic reactions like skin rash, itching or hives, swelling of the face, lips, or tongue back pain dizziness or feeling faint fever pain, redness, or   irritation at site where injected pinpoint red spots on the skin shortness of breath or breathing problems signs and symptoms of kidney injury like trouble passing urine, change in the amount of urine, or red or dark-brown urine stomach or side pain, or pain at  the shoulder swelling tiredness unusual bleeding or bruising Side effects that usually do not require medical attention (report to your doctor or health care professional if they continue or are bothersome): bone pain cough diarrhea hair loss headache muscle pain This list may not describe all possible side effects. Call your doctor for medical advice about side effects. You may report side effects to FDA at 1-800-FDA-1088. Where should I keep my medication? Keep out of the reach of children. Store in a refrigerator between 2 and 8 degrees C (36 and 46 degrees F). Do not freeze. Keep in carton to protect from light. Throw away this medicine if vials or syringes are left out of the refrigerator for more than 24 hours. Throw away any unused medicine after the expiration date. NOTE: This sheet is a summary. It may not cover all possible information. If you have questions about this medicine, talk to your doctor, pharmacist, or health care provider.  2022 Elsevier/Gold Standard (2019-01-17 18:47:55)  

## 2020-07-04 ENCOUNTER — Inpatient Hospital Stay: Payer: 59

## 2020-07-04 VITALS — BP 108/85 | HR 82 | Temp 97.6°F | Resp 18

## 2020-07-04 DIAGNOSIS — Z170 Estrogen receptor positive status [ER+]: Secondary | ICD-10-CM | POA: Diagnosis not present

## 2020-07-04 DIAGNOSIS — C50411 Malignant neoplasm of upper-outer quadrant of right female breast: Secondary | ICD-10-CM | POA: Diagnosis not present

## 2020-07-04 DIAGNOSIS — Z95828 Presence of other vascular implants and grafts: Secondary | ICD-10-CM

## 2020-07-04 DIAGNOSIS — Z5112 Encounter for antineoplastic immunotherapy: Secondary | ICD-10-CM | POA: Diagnosis not present

## 2020-07-04 DIAGNOSIS — R110 Nausea: Secondary | ICD-10-CM | POA: Diagnosis not present

## 2020-07-04 DIAGNOSIS — D6481 Anemia due to antineoplastic chemotherapy: Secondary | ICD-10-CM | POA: Diagnosis not present

## 2020-07-04 DIAGNOSIS — R5383 Other fatigue: Secondary | ICD-10-CM | POA: Diagnosis not present

## 2020-07-04 DIAGNOSIS — L658 Other specified nonscarring hair loss: Secondary | ICD-10-CM | POA: Diagnosis not present

## 2020-07-04 DIAGNOSIS — Z5111 Encounter for antineoplastic chemotherapy: Secondary | ICD-10-CM | POA: Diagnosis not present

## 2020-07-04 DIAGNOSIS — D6959 Other secondary thrombocytopenia: Secondary | ICD-10-CM | POA: Diagnosis not present

## 2020-07-04 MED ORDER — FILGRASTIM-SNDZ 300 MCG/0.5ML IJ SOSY
PREFILLED_SYRINGE | INTRAMUSCULAR | Status: AC
  Filled 2020-07-04: qty 0.5

## 2020-07-04 MED ORDER — FILGRASTIM-SNDZ 300 MCG/0.5ML IJ SOSY
300.0000 ug | PREFILLED_SYRINGE | Freq: Once | INTRAMUSCULAR | Status: AC
  Administered 2020-07-04: 300 ug via SUBCUTANEOUS

## 2020-07-04 NOTE — Patient Instructions (Signed)
Filgrastim, G-CSF injection What is this medication? FILGRASTIM, G-CSF (fil GRA stim) is a granulocyte colony-stimulating factor that stimulates the growth of neutrophils, a type of white blood cell (WBC) important in the body's fight against infection. It is used to reduce the incidence of fever and infection in patients with certain types of cancer who are receiving chemotherapy that affects the bone marrow, to stimulate blood cell production for removal of WBCs from the body prior to a bone marrow transplantation, to reduce the incidence of fever and infection in patients who have severe chronic neutropenia, and to improve survival outcomes following high-dose radiation exposure that is toxic to the bone marrow. This medicine may be used for other purposes; ask your health care provider or pharmacist if you have questions. COMMON BRAND NAME(S): Neupogen, Nivestym, Releuko, Zarxio What should I tell my care team before I take this medication? They need to know if you have any of these conditions: kidney disease latex allergy ongoing radiation therapy sickle cell disease an unusual or allergic reaction to filgrastim, pegfilgrastim, other medicines, foods, dyes, or preservatives pregnant or trying to get pregnant breast-feeding How should I use this medication? This medicine is for injection under the skin or infusion into a vein. As an infusion into a vein, it is usually given by a health care professional in a hospital or clinic setting. If you get this medicine at home, you will be taught how to prepare and give this medicine. Refer to the Instructions for Use that come with your medication packaging. Use exactly as directed. Take your medicine at regular intervals. Do not take your medicine more often than directed. It is important that you put your used needles and syringes in a special sharps container. Do not put them in a trash can. If you do not have a sharps container, call your pharmacist  or healthcare provider to get one. Talk to your pediatrician regarding the use of this medicine in children. While this drug may be prescribed for children as young as 7 months for selected conditions, precautions do apply. Overdosage: If you think you have taken too much of this medicine contact a poison control center or emergency room at once. NOTE: This medicine is only for you. Do not share this medicine with others. What if I miss a dose? It is important not to miss your dose. Call your doctor or health care professional if you miss a dose. What may interact with this medication? This medicine may interact with the following medications: medicines that may cause a release of neutrophils, such as lithium This list may not describe all possible interactions. Give your health care provider a list of all the medicines, herbs, non-prescription drugs, or dietary supplements you use. Also tell them if you smoke, drink alcohol, or use illegal drugs. Some items may interact with your medicine. What should I watch for while using this medication? Your condition will be monitored carefully while you are receiving this medicine. You may need blood work done while you are taking this medicine. Talk to your health care provider about your risk of cancer. You may be more at risk for certain types of cancer if you take this medicine. What side effects may I notice from receiving this medication? Side effects that you should report to your doctor or health care professional as soon as possible: allergic reactions like skin rash, itching or hives, swelling of the face, lips, or tongue back pain dizziness or feeling faint fever pain, redness, or   irritation at site where injected pinpoint red spots on the skin shortness of breath or breathing problems signs and symptoms of kidney injury like trouble passing urine, change in the amount of urine, or red or dark-brown urine stomach or side pain, or pain at  the shoulder swelling tiredness unusual bleeding or bruising Side effects that usually do not require medical attention (report to your doctor or health care professional if they continue or are bothersome): bone pain cough diarrhea hair loss headache muscle pain This list may not describe all possible side effects. Call your doctor for medical advice about side effects. You may report side effects to FDA at 1-800-FDA-1088. Where should I keep my medication? Keep out of the reach of children. Store in a refrigerator between 2 and 8 degrees C (36 and 46 degrees F). Do not freeze. Keep in carton to protect from light. Throw away this medicine if vials or syringes are left out of the refrigerator for more than 24 hours. Throw away any unused medicine after the expiration date. NOTE: This sheet is a summary. It may not cover all possible information. If you have questions about this medicine, talk to your doctor, pharmacist, or health care provider.  2022 Elsevier/Gold Standard (2019-01-17 18:47:55)  

## 2020-07-07 ENCOUNTER — Other Ambulatory Visit: Payer: Self-pay | Admitting: *Deleted

## 2020-07-07 ENCOUNTER — Inpatient Hospital Stay: Payer: 59

## 2020-07-07 ENCOUNTER — Telehealth: Payer: Self-pay | Admitting: Hematology and Oncology

## 2020-07-07 ENCOUNTER — Inpatient Hospital Stay (HOSPITAL_BASED_OUTPATIENT_CLINIC_OR_DEPARTMENT_OTHER): Payer: 59 | Admitting: Hematology and Oncology

## 2020-07-07 ENCOUNTER — Other Ambulatory Visit: Payer: Self-pay | Admitting: Pharmacist

## 2020-07-07 ENCOUNTER — Encounter: Payer: Self-pay | Admitting: *Deleted

## 2020-07-07 ENCOUNTER — Other Ambulatory Visit: Payer: 59

## 2020-07-07 ENCOUNTER — Other Ambulatory Visit: Payer: Self-pay

## 2020-07-07 DIAGNOSIS — C50411 Malignant neoplasm of upper-outer quadrant of right female breast: Secondary | ICD-10-CM

## 2020-07-07 DIAGNOSIS — D6481 Anemia due to antineoplastic chemotherapy: Secondary | ICD-10-CM | POA: Diagnosis not present

## 2020-07-07 DIAGNOSIS — R110 Nausea: Secondary | ICD-10-CM | POA: Diagnosis not present

## 2020-07-07 DIAGNOSIS — Z170 Estrogen receptor positive status [ER+]: Secondary | ICD-10-CM

## 2020-07-07 DIAGNOSIS — Z5111 Encounter for antineoplastic chemotherapy: Secondary | ICD-10-CM | POA: Diagnosis not present

## 2020-07-07 DIAGNOSIS — D6959 Other secondary thrombocytopenia: Secondary | ICD-10-CM | POA: Diagnosis not present

## 2020-07-07 DIAGNOSIS — Z95828 Presence of other vascular implants and grafts: Secondary | ICD-10-CM

## 2020-07-07 DIAGNOSIS — L658 Other specified nonscarring hair loss: Secondary | ICD-10-CM | POA: Diagnosis not present

## 2020-07-07 DIAGNOSIS — Z5112 Encounter for antineoplastic immunotherapy: Secondary | ICD-10-CM | POA: Diagnosis not present

## 2020-07-07 DIAGNOSIS — R5383 Other fatigue: Secondary | ICD-10-CM | POA: Diagnosis not present

## 2020-07-07 LAB — CBC WITH DIFFERENTIAL (CANCER CENTER ONLY)
Abs Immature Granulocytes: 0.01 10*3/uL (ref 0.00–0.07)
Basophils Absolute: 0 10*3/uL (ref 0.0–0.1)
Basophils Relative: 0 %
Eosinophils Absolute: 0 10*3/uL (ref 0.0–0.5)
Eosinophils Relative: 0 %
HCT: 29.9 % — ABNORMAL LOW (ref 36.0–46.0)
Hemoglobin: 10.2 g/dL — ABNORMAL LOW (ref 12.0–15.0)
Immature Granulocytes: 0 %
Lymphocytes Relative: 46 %
Lymphs Abs: 1.3 10*3/uL (ref 0.7–4.0)
MCH: 32.9 pg (ref 26.0–34.0)
MCHC: 34.1 g/dL (ref 30.0–36.0)
MCV: 96.5 fL (ref 80.0–100.0)
Monocytes Absolute: 0.4 10*3/uL (ref 0.1–1.0)
Monocytes Relative: 13 %
Neutro Abs: 1.1 10*3/uL — ABNORMAL LOW (ref 1.7–7.7)
Neutrophils Relative %: 41 %
Platelet Count: 159 10*3/uL (ref 150–400)
RBC: 3.1 MIL/uL — ABNORMAL LOW (ref 3.87–5.11)
RDW: 15.1 % (ref 11.5–15.5)
WBC Count: 2.8 10*3/uL — ABNORMAL LOW (ref 4.0–10.5)
nRBC: 0 % (ref 0.0–0.2)

## 2020-07-07 LAB — CMP (CANCER CENTER ONLY)
ALT: 26 U/L (ref 0–44)
AST: 26 U/L (ref 15–41)
Albumin: 4 g/dL (ref 3.5–5.0)
Alkaline Phosphatase: 83 U/L (ref 38–126)
Anion gap: 9 (ref 5–15)
BUN: 9 mg/dL (ref 6–20)
CO2: 28 mmol/L (ref 22–32)
Calcium: 9.4 mg/dL (ref 8.9–10.3)
Chloride: 103 mmol/L (ref 98–111)
Creatinine: 0.67 mg/dL (ref 0.44–1.00)
GFR, Estimated: >60 mL/min (ref >60)
Glucose, Bld: 91 mg/dL (ref 70–99)
Potassium: 4 mmol/L (ref 3.5–5.1)
Sodium: 140 mmol/L (ref 135–145)
Total Bilirubin: 0.3 mg/dL (ref 0.3–1.2)
Total Protein: 7.5 g/dL (ref 6.5–8.1)

## 2020-07-07 LAB — TSH: TSH: 0.295 u[IU]/mL — ABNORMAL LOW (ref 0.308–3.960)

## 2020-07-07 MED ORDER — SODIUM CHLORIDE 0.9 % IV SOLN
200.0000 mg | Freq: Once | INTRAVENOUS | Status: AC
  Administered 2020-07-07: 200 mg via INTRAVENOUS
  Filled 2020-07-07: qty 8

## 2020-07-07 MED ORDER — SODIUM CHLORIDE 0.9% FLUSH
10.0000 mL | INTRAVENOUS | Status: DC | PRN
  Administered 2020-07-07: 10 mL
  Filled 2020-07-07: qty 10

## 2020-07-07 MED ORDER — DIPHENHYDRAMINE HCL 50 MG/ML IJ SOLN
INTRAMUSCULAR | Status: AC
  Filled 2020-07-07: qty 1

## 2020-07-07 MED ORDER — HEPARIN SOD (PORK) LOCK FLUSH 100 UNIT/ML IV SOLN
500.0000 [IU] | Freq: Once | INTRAVENOUS | Status: AC | PRN
  Administered 2020-07-07: 500 [IU]
  Filled 2020-07-07: qty 5

## 2020-07-07 MED ORDER — FAMOTIDINE 20 MG PO TABS
ORAL_TABLET | ORAL | Status: AC
  Filled 2020-07-07: qty 1

## 2020-07-07 MED ORDER — SODIUM CHLORIDE 0.9 % IV SOLN
10.0000 mg | Freq: Once | INTRAVENOUS | Status: AC
  Administered 2020-07-07: 10 mg via INTRAVENOUS
  Filled 2020-07-07: qty 10

## 2020-07-07 MED ORDER — DIPHENHYDRAMINE HCL 50 MG/ML IJ SOLN
25.0000 mg | Freq: Once | INTRAMUSCULAR | Status: AC
  Administered 2020-07-07: 25 mg via INTRAVENOUS

## 2020-07-07 MED ORDER — SODIUM CHLORIDE 0.9 % IV SOLN
Freq: Once | INTRAVENOUS | Status: AC
  Filled 2020-07-07: qty 250

## 2020-07-07 MED ORDER — PALONOSETRON HCL INJECTION 0.25 MG/5ML
INTRAVENOUS | Status: AC
  Filled 2020-07-07: qty 5

## 2020-07-07 MED ORDER — SODIUM CHLORIDE 0.9 % IV SOLN
500.0000 mg | Freq: Once | INTRAVENOUS | Status: AC
  Administered 2020-07-07: 500 mg via INTRAVENOUS
  Filled 2020-07-07: qty 50

## 2020-07-07 MED ORDER — SODIUM CHLORIDE 0.9 % IV SOLN
150.0000 mg | Freq: Once | INTRAVENOUS | Status: AC
  Administered 2020-07-07: 150 mg via INTRAVENOUS
  Filled 2020-07-07: qty 150

## 2020-07-07 MED ORDER — FAMOTIDINE 20 MG PO TABS
20.0000 mg | ORAL_TABLET | Freq: Once | ORAL | Status: AC
  Administered 2020-07-07: 20 mg via ORAL

## 2020-07-07 MED ORDER — SODIUM CHLORIDE 0.9 % IV SOLN
65.0000 mg/m2 | Freq: Once | INTRAVENOUS | Status: AC
  Administered 2020-07-07: 102 mg via INTRAVENOUS
  Filled 2020-07-07: qty 17

## 2020-07-07 MED ORDER — FAMOTIDINE 20 MG IN NS 100 ML IVPB
20.0000 mg | Freq: Once | INTRAVENOUS | Status: AC
  Administered 2020-07-07: 20 mg via INTRAVENOUS

## 2020-07-07 MED ORDER — PALONOSETRON HCL INJECTION 0.25 MG/5ML
0.2500 mg | Freq: Once | INTRAVENOUS | Status: AC
  Administered 2020-07-07: 0.25 mg via INTRAVENOUS

## 2020-07-07 MED ORDER — SODIUM CHLORIDE 0.9% FLUSH
10.0000 mL | Freq: Once | INTRAVENOUS | Status: AC
  Administered 2020-07-07: 10 mL
  Filled 2020-07-07: qty 10

## 2020-07-07 MED ORDER — FAMOTIDINE 20 MG IN NS 100 ML IVPB
INTRAVENOUS | Status: AC
  Filled 2020-07-07: qty 100

## 2020-07-07 NOTE — Assessment & Plan Note (Signed)
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  Treatment plan: 1. Neo- adjuvant chemotherapy with dose dense Adriamycin and Cytoxan/Keytrudafollowed by Taxol and carboplatin 2.bilateral mastectomieswith sentinel lymph node biopsy 3. Adjuvant radiation 4. followed by adjuvant antiestrogen therapy 5. Genetic testing _______________________________________________________________________ Current Treatment:Completed 4 cycles of neoadjuvant chemotherapy with Adriamycin/Cytoxan/Keytruda,today cycle6Taxol (carboplatin and Keytruda every 3 weeks)  Chemo toxicities: 1.Alopecia 2.fatigue: Stable 3.Chemotherapy-induced anemia: Hemoglobin9.8: Monitoring 4.Thrombocytopenia:Today's platelet count is 188 5.Leukopenia/neutropenia:We are administering Granix(2injections)prior to each chemo.   She had constipation which later led to hemorrhoidal bleeding.  Denies any peripheral neuropathy. Monitoring closely for toxicities.

## 2020-07-07 NOTE — Progress Notes (Signed)
Patient Care Team: Emeterio Reeve, DO as PCP - General (Osteopathic Medicine) [name], MD as Consulting Physician (Obstetrics and Gynecology) Mauro Kaufmann, RN as Oncology Nurse Navigator Rockwell Germany, RN as Oncology Nurse Navigator  DIAGNOSIS:  Encounter Diagnosis  Name Primary?   Malignant neoplasm of upper-outer quadrant of right breast in female, estrogen receptor positive ([place])     SUMMARY OF ONCOLOGIC HISTORY: Oncology History  Malignant neoplasm of upper outer quadrant of female breast ([place])  01/28/2020 Initial Diagnosis   Patient palpated a right breast lump. Diagnostic mammogram and US showed calcifications spanning 2.0cm in the right breast. Biopsy showed invasive and in situ carcinoma, grade 2. ER 5% weak, PR 0% negative, HER2 equiv, Ki 10%     01/28/2020 Cancer Staging   Staging form: Breast, AJCC 8th Edition - Clinical stage from 01/28/2020: Stage IA (cT1c, cN0, cM0, G2, ER+, PR-, HER2: Equivocal) - Signed by Nicholas Lose, MD on 01/29/2020    02/05/2020 -  Chemotherapy    Patient is on Treatment Plan: BREAST DOSE DENSE AC Q14D / CARBOPLATIN D1 + PACLITAXEL D1,8,15 Q21D       02/13/2020 Genetic Testing   Positive genetic testing:  A single, heterozygous, pathogenic variant was detected in the BRCA2 gene called c.2808_2811delACAA. Testing was completed through the CustomNext-Cancer + RNAinsight panel offered by [facility]. A variant of uncertain significance (VUS) was also detected in the Sydney Herman gene called c.2156C>T (p.T719I). The report date is 02/13/2020.  The CustomNext-Cancer+RNAinsight panel offered by [facility] includes sequencing and rearrangement analysis for the following 47 genes:  APC, ATM, AXIN2, BARD1, BMPR1A, BRCA1, BRCA2, BRIP1, CDH1, CDK4, CDKN2A, CHEK2, DICER1, EPCAM, GREM1, HOXB13, MEN1, MLH1, MSH2, MSH3, Sydney Herman, MUTYH, NBN, NF1, NF2, NTHL1, PALB2, PMS2, POLD1, POLE, PTEN, RAD51C, RAD51D, RECQL, RET, SDHA, SDHAF2, SDHB,  SDHC, SDHD, SMAD4, SMARCA4, STK11, TP53, TSC1, TSC2, and VHL.  RNA data is routinely analyzed for use in variant interpretation for all genes.     CHIEF COMPLIANT:   INTERVAL HISTORY: Sydney Herman is a   ALLERGIES:  is allergic to amoxil [amoxicillin].  MEDICATIONS:  Current Outpatient Medications  Medication Sig Dispense Refill   cycloSPORINE (RESTASIS) 0.05 % ophthalmic emulsion INSILL 1 DROP INTO BOTH EYES TWICE DAILY 180 mL 4   doxycycline (VIBRA-TABS) 100 MG tablet TAKE 1 TABLET BY MOUTH 2 TIMES DAILY FOR 7 DAYS 14 tablet 0   escitalopram (LEXAPRO) 10 MG tablet Take 1 tablet (10 mg total) by mouth daily. 90 tablet 3   lidocaine-prilocaine (EMLA) cream APPLY TO AFFECTED AREA ONCE 30 g 3   loratadine (CLARITIN) 10 MG tablet 10 mg.     LORazepam (ATIVAN) 0.5 MG tablet TAKE 1 TABLET BY MOUTH AT BEDTIME AS NEEDED FOR NAUSEA OR VOMITING 30 tablet 0   ondansetron (ZOFRAN) 8 MG tablet TAKE 1 TABLET BY MOUTH 2 TIMES DAILY AS NEEDED START ON THE THIRD DAY AFTER CHEMO 30 tablet 1   prochlorperazine (COMPAZINE) 10 MG tablet TAKE 1 TABLET BY MOUTH EVERY 6 HOURS AS NEEDED FOR NAUSEA OR VOMITING 30 tablet 1   RESTASIS 0.05 % ophthalmic emulsion Place 1 drop into the right eye 2 (two) times daily.     No current facility-administered medications for this visit.   Facility-Administered Medications Ordered in Other Visits  Medication Dose Route Frequency Provider Last Rate Last Admin   CARBOplatin (PARAPLATIN) 500 mg in sodium chloride 0.9 % 250 mL chemo infusion  500 mg Intravenous Once Nicholas Lose, MD  heparin lock flush 100 unit/mL  500 Units Intracatheter Once PRN Nicholas Lose, MD       PACLitaxel (TAXOL) 102 mg in sodium chloride 0.9 % 250 mL chemo infusion (</= 36m/m2)  65 mg/m2 (Treatment Plan Recorded) Intravenous Once GNicholas Lose MD       pembrolizumab ([facility] 200 mg in sodium chloride 0.9 % 50 mL chemo infusion  200 mg Intravenous Once GNicholas Lose MD       sodium  chloride flush (NS) 0.9 % injection 10 mL  10 mL Intracatheter PRN GNicholas Lose MD        PHYSICAL EXAMINATION: ECOG PERFORMANCE STATUS: 1 - Symptomatic but completely ambulatory  Vitals:   07/07/20 1205  BP: 108/72  Pulse: 70  Resp: 17  Temp: 97.7 F (36.5 C)   Filed Weights   07/07/20 1205  Weight: 120 lb 14.4 oz (54.8 kg)      LABORATORY DATA:  I have reviewed the data as listed CMP Latest Ref Rng & Units 07/07/2020 07/01/2020 06/23/2020  Glucose 70 - 99 mg/dL 91 85 97  BUN 6 - 20 mg/dL _0 Creatinine 0.44 - 1.00 mg/dL 0.67 0.69 0.68  Sodium 135 - 145 mmol/L 140 136 139  Potassium 3.5 - 5.1 mmol/L 4.0 3.8 3.8  Chloride 98 - 111 mmol/L 103 102 103  CO2 22 - 32 mmol/L _1 Calcium 8.9 - 10.3 mg/dL 9.4 9.4 9.4  Total Protein 6.5 - 8.1 g/dL 7.5 7.4 7.4  Total Bilirubin 0.3 - 1.2 mg/dL 0.3 <0.2(L) 0.4  Alkaline Phos 38 - 126 U/L 83 87 78  AST 15 - 41 U/L _2 ALT 0 - 44 U/L _3 Lab Results  Component Value Date   WBC 2.8 (L) 07/07/2020   HGB 10.2 (L) 07/07/2020   HCT 29.9 (L) 07/07/2020   MCV 96.5 07/07/2020   PLT 159 07/07/2020   NEUTROABS 1.1 (L) 07/07/2020    ASSESSMENT & PLAN:  Malignant neoplasm of upper outer quadrant of female breast ([place] 01/28/2020: Palpable right breast lump: Mammogram and ultrasound revealed calcifications spanning 2 cm, biopsy revealed IDC with DCIS, grade 2, 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     Treatment plan: 1. Neo- adjuvant chemotherapy with dose dense Adriamycin and Cytoxan/Keytruda followed by Taxol and carboplatin 2. bilateral mastectomies with sentinel lymph node biopsy 3. Adjuvant radiation 4. followed by adjuvant antiestrogen therapy 5.  Genetic  testing _______________________________________________________________________ Current Treatment: Completed 4 cycles of neoadjuvant chemotherapy with Adriamycin/Cytoxan/Keytruda, today cycle 7 Taxol (carboplatin and Keytruda every 3 weeks)   Chemo toxicities: 1.  Alopecia 2. fatigue: Stable 3.  Chemotherapy-induced anemia: Hemoglobin 10.2: Monitoring 4.  Thrombocytopenia: Today's platelet count is 159 5.  Leukopenia/neutropenia: We are administering Granix (2 injections) prior to each chemo.  Today's ANC is 1.1 We will proceed with today's treatment but I expect a dose reduction of holding treatment if her counts continue to remain low. I would like to add 1/3 injection of Granix to be administered this Thursday because of her low ANC and the fact that today's treatment has both Taxol and carboplatin.     Denies any peripheral neuropathy. Monitoring closely for toxicities.   No orders of the defined types were placed in this encounter.  The patient  has a good understanding of the overall plan. she agrees with it. she will call with any problems that may develop before the next visit here. Total time spent: 30 mins including face to face time and time spent for planning, charting and co-ordination of care   Harriette Ohara, MD 07/07/20

## 2020-07-07 NOTE — Telephone Encounter (Signed)
Scheduled appt per 6/28 secure chat from [employer]. Pt aware.

## 2020-07-07 NOTE — Patient Instructions (Addendum)
[facility] ONCOLOGY  Discharge Instructions: Thank you for choosing [facility] to provide your oncology and hematology care.   If you have a lab appointment with the [facility], please go directly to the [facility] and check in at the registration area.   Wear comfortable clothing and clothing appropriate for easy access to any Portacath or PICC line.   We strive to give you quality time with your provider. You may need to reschedule your appointment if you arrive late (15 or more minutes).  Arriving late affects you and other patients whose appointments are after yours.  Also, if you miss three or more appointments without notifying the office, you may be dismissed from the clinic at the provider's discretion.      For prescription refill requests, have your pharmacy contact our office and allow 72 hours for refills to be completed.    Today you received the following chemotherapy and/or immunotherapy agents Keytruda; Taxol; Carboplatin      To help prevent nausea and vomiting after your treatment, we encourage you to take your nausea medication as directed.  BELOW ARE SYMPTOMS THAT SHOULD BE REPORTED IMMEDIATELY: *FEVER GREATER THAN 100.4 F (38 C) OR HIGHER *CHILLS OR SWEATING *NAUSEA AND VOMITING THAT IS NOT CONTROLLED WITH YOUR NAUSEA MEDICATION *UNUSUAL SHORTNESS OF BREATH *UNUSUAL BRUISING OR BLEEDING *URINARY PROBLEMS (pain or burning when urinating, or frequent urination) *BOWEL PROBLEMS (unusual diarrhea, constipation, pain near the anus) TENDERNESS IN MOUTH AND THROAT WITH OR WITHOUT PRESENCE OF ULCERS (sore throat, sores in mouth, or a toothache) UNUSUAL RASH, SWELLING OR PAIN  UNUSUAL VAGINAL DISCHARGE OR ITCHING   Items with * indicate a potential emergency and should be followed up as soon as possible or go to the Emergency Department if any problems should occur.  Please show the CHEMOTHERAPY ALERT CARD or IMMUNOTHERAPY ALERT  CARD at check-in to the Emergency Department and triage nurse.  Should you have questions after your visit or need to cancel or reschedule your appointment, please contact [facility]  Dept: [phone]  and follow the prompts.  Office hours are 8:00 a.m. to 4:30 p.m. Monday - Friday. Please note that voicemails left after 4:00 p.m. may not be returned until the following business day.  We are closed weekends and major holidays. You have access to a nurse at all times for urgent questions. Please call the main number to the clinic Dept: [phone] and follow the prompts.   For any non-urgent questions, you may also contact your provider using MyChart. We now offer e-Visits for anyone 25 and older to request care online for non-urgent symptoms. For details visit [email or web address].   Also download the MyChart app! Go to the app store, search "MyChart", open the app, select [facility], and log in with your MyChart username and password.  Due to Covid, a mask is required upon entering the hospital/clinic. If you do not have a mask, one will be given to you upon arrival. For doctor visits, patients may have 1 support person aged 57 or older with them. For treatment visits, patients cannot have anyone with them due to current Covid guidelines and our immunocompromised population.

## 2020-07-07 NOTE — Progress Notes (Signed)
Per MD okay to treat with [place] 1.1.

## 2020-07-07 NOTE — Progress Notes (Signed)
Per MD pt needing urine pregnancy q4 weeks.  Orders placed

## 2020-07-08 ENCOUNTER — Other Ambulatory Visit: Payer: Self-pay | Admitting: *Deleted

## 2020-07-08 ENCOUNTER — Encounter: Payer: Self-pay | Admitting: Hematology and Oncology

## 2020-07-09 ENCOUNTER — Inpatient Hospital Stay: Payer: 59

## 2020-07-09 ENCOUNTER — Other Ambulatory Visit: Payer: Self-pay

## 2020-07-09 VITALS — BP 109/72 | HR 68 | Temp 98.3°F | Resp 18

## 2020-07-09 DIAGNOSIS — Z95828 Presence of other vascular implants and grafts: Secondary | ICD-10-CM

## 2020-07-09 DIAGNOSIS — R110 Nausea: Secondary | ICD-10-CM | POA: Diagnosis not present

## 2020-07-09 DIAGNOSIS — C50411 Malignant neoplasm of upper-outer quadrant of right female breast: Secondary | ICD-10-CM | POA: Diagnosis not present

## 2020-07-09 DIAGNOSIS — D6481 Anemia due to antineoplastic chemotherapy: Secondary | ICD-10-CM | POA: Diagnosis not present

## 2020-07-09 DIAGNOSIS — Z5111 Encounter for antineoplastic chemotherapy: Secondary | ICD-10-CM | POA: Diagnosis not present

## 2020-07-09 DIAGNOSIS — Z5112 Encounter for antineoplastic immunotherapy: Secondary | ICD-10-CM | POA: Diagnosis not present

## 2020-07-09 DIAGNOSIS — D6959 Other secondary thrombocytopenia: Secondary | ICD-10-CM | POA: Diagnosis not present

## 2020-07-09 DIAGNOSIS — Z170 Estrogen receptor positive status [ER+]: Secondary | ICD-10-CM | POA: Diagnosis not present

## 2020-07-09 DIAGNOSIS — R5383 Other fatigue: Secondary | ICD-10-CM | POA: Diagnosis not present

## 2020-07-09 DIAGNOSIS — L658 Other specified nonscarring hair loss: Secondary | ICD-10-CM | POA: Diagnosis not present

## 2020-07-09 MED ORDER — FILGRASTIM-SNDZ 300 MCG/0.5ML IJ SOSY
300.0000 ug | PREFILLED_SYRINGE | Freq: Once | INTRAMUSCULAR | Status: AC
  Administered 2020-07-09: 300 ug via SUBCUTANEOUS

## 2020-07-09 MED ORDER — FILGRASTIM-SNDZ 300 MCG/0.5ML IJ SOSY
PREFILLED_SYRINGE | INTRAMUSCULAR | Status: AC
  Filled 2020-07-09: qty 0.5

## 2020-07-09 NOTE — Patient Instructions (Signed)
Filgrastim, G-CSF injection What is this medication? FILGRASTIM, G-CSF (fil GRA stim) is a granulocyte colony-stimulating factor that stimulates the growth of neutrophils, a type of white blood cell (WBC) important in the body's fight against infection. It is used to reduce the incidence of fever and infection in patients with certain types of cancer who are receiving chemotherapy that affects the bone marrow, to stimulate blood cell production for removal of WBCs from the body prior to a bone marrow transplantation, to reduce the incidence of fever and infection in patients who have severe chronic neutropenia, and to improve survival outcomes following high-dose radiation exposure that is toxic to the bone marrow. This medicine may be used for other purposes; ask your health care provider or pharmacist if you have questions. COMMON BRAND NAME(S): Neupogen, Nivestym, Releuko, Zarxio What should I tell my care team before I take this medication? They need to know if you have any of these conditions: kidney disease latex allergy ongoing radiation therapy sickle cell disease an unusual or allergic reaction to filgrastim, pegfilgrastim, other medicines, foods, dyes, or preservatives pregnant or trying to get pregnant breast-feeding How should I use this medication? This medicine is for injection under the skin or infusion into a vein. As an infusion into a vein, it is usually given by a health care professional in a hospital or clinic setting. If you get this medicine at home, you will be taught how to prepare and give this medicine. Refer to the Instructions for Use that come with your medication packaging. Use exactly as directed. Take your medicine at regular intervals. Do not take your medicine more often than directed. It is important that you put your used needles and syringes in a special sharps container. Do not put them in a trash can. If you do not have a sharps container, call your pharmacist  or healthcare provider to get one. Talk to your pediatrician regarding the use of this medicine in children. While this drug may be prescribed for children as young as 7 months for selected conditions, precautions do apply. Overdosage: If you think you have taken too much of this medicine contact a poison control center or emergency room at once. NOTE: This medicine is only for you. Do not share this medicine with others. What if I miss a dose? It is important not to miss your dose. Call your doctor or health care professional if you miss a dose. What may interact with this medication? This medicine may interact with the following medications: medicines that may cause a release of neutrophils, such as lithium This list may not describe all possible interactions. Give your health care provider a list of all the medicines, herbs, non-prescription drugs, or dietary supplements you use. Also tell them if you smoke, drink alcohol, or use illegal drugs. Some items may interact with your medicine. What should I watch for while using this medication? Your condition will be monitored carefully while you are receiving this medicine. You may need blood work done while you are taking this medicine. Talk to your health care provider about your risk of cancer. You may be more at risk for certain types of cancer if you take this medicine. What side effects may I notice from receiving this medication? Side effects that you should report to your doctor or health care professional as soon as possible: allergic reactions like skin rash, itching or hives, swelling of the face, lips, or tongue back pain dizziness or feeling faint fever pain, redness, or   irritation at site where injected pinpoint red spots on the skin shortness of breath or breathing problems signs and symptoms of kidney injury like trouble passing urine, change in the amount of urine, or red or dark-brown urine stomach or side pain, or pain at  the shoulder swelling tiredness unusual bleeding or bruising Side effects that usually do not require medical attention (report to your doctor or health care professional if they continue or are bothersome): bone pain cough diarrhea hair loss headache muscle pain This list may not describe all possible side effects. Call your doctor for medical advice about side effects. You may report side effects to FDA at 1-800-FDA-1088. Where should I keep my medication? Keep out of the reach of children. Store in a refrigerator between 2 and 8 degrees C (36 and 46 degrees F). Do not freeze. Keep in carton to protect from light. Throw away this medicine if vials or syringes are left out of the refrigerator for more than 24 hours. Throw away any unused medicine after the expiration date. NOTE: This sheet is a summary. It may not cover all possible information. If you have questions about this medicine, talk to your doctor, pharmacist, or health care provider.  2022 Elsevier/Gold Standard (2019-01-17 18:47:55)  

## 2020-07-10 ENCOUNTER — Inpatient Hospital Stay: Payer: 59 | Attending: Adult Health

## 2020-07-10 ENCOUNTER — Other Ambulatory Visit: Payer: Self-pay

## 2020-07-10 VITALS — BP 113/68 | HR 84 | Temp 98.2°F | Resp 18

## 2020-07-10 DIAGNOSIS — C50411 Malignant neoplasm of upper-outer quadrant of right female breast: Secondary | ICD-10-CM | POA: Insufficient documentation

## 2020-07-10 DIAGNOSIS — D702 Other drug-induced agranulocytosis: Secondary | ICD-10-CM | POA: Diagnosis not present

## 2020-07-10 DIAGNOSIS — Z95828 Presence of other vascular implants and grafts: Secondary | ICD-10-CM

## 2020-07-10 DIAGNOSIS — Z170 Estrogen receptor positive status [ER+]: Secondary | ICD-10-CM | POA: Diagnosis not present

## 2020-07-10 DIAGNOSIS — Z5112 Encounter for antineoplastic immunotherapy: Secondary | ICD-10-CM | POA: Insufficient documentation

## 2020-07-10 DIAGNOSIS — L659 Nonscarring hair loss, unspecified: Secondary | ICD-10-CM | POA: Diagnosis not present

## 2020-07-10 DIAGNOSIS — D6481 Anemia due to antineoplastic chemotherapy: Secondary | ICD-10-CM | POA: Insufficient documentation

## 2020-07-10 DIAGNOSIS — R5383 Other fatigue: Secondary | ICD-10-CM | POA: Insufficient documentation

## 2020-07-10 DIAGNOSIS — Z5111 Encounter for antineoplastic chemotherapy: Secondary | ICD-10-CM | POA: Diagnosis not present

## 2020-07-10 DIAGNOSIS — T451X5A Adverse effect of antineoplastic and immunosuppressive drugs, initial encounter: Secondary | ICD-10-CM | POA: Diagnosis not present

## 2020-07-10 DIAGNOSIS — Z79899 Other long term (current) drug therapy: Secondary | ICD-10-CM | POA: Insufficient documentation

## 2020-07-10 DIAGNOSIS — D6959 Other secondary thrombocytopenia: Secondary | ICD-10-CM | POA: Diagnosis not present

## 2020-07-10 MED ORDER — FILGRASTIM-SNDZ 300 MCG/0.5ML IJ SOSY
PREFILLED_SYRINGE | INTRAMUSCULAR | Status: AC
  Filled 2020-07-10: qty 0.5

## 2020-07-10 MED ORDER — FILGRASTIM-SNDZ 300 MCG/0.5ML IJ SOSY
300.0000 ug | PREFILLED_SYRINGE | Freq: Once | INTRAMUSCULAR | Status: AC
  Administered 2020-07-10: 300 ug via SUBCUTANEOUS

## 2020-07-10 NOTE — Progress Notes (Signed)
MD wishes to proceed with Zarxio injection on 07/11/20.  Raul [place], [place], BCPS, BCOP 07/10/2020 3:19 PM

## 2020-07-10 NOTE — Patient Instructions (Signed)
Filgrastim, G-CSF injection What is this medication? FILGRASTIM, G-CSF (fil GRA stim) is a granulocyte colony-stimulating factor that stimulates the growth of neutrophils, a type of white blood cell (WBC) important in the body's fight against infection. It is used to reduce the incidence of fever and infection in patients with certain types of cancer who are receiving chemotherapy that affects the bone marrow, to stimulate blood cell production for removal of WBCs from the body prior to a bone marrow transplantation, to reduce the incidence of fever and infection in patients who have severe chronic neutropenia, and to improve survival outcomes following high-dose radiation exposure that is toxic to the bone marrow. This medicine may be used for other purposes; ask your health care provider or pharmacist if you have questions. COMMON BRAND NAME(S): Neupogen, Nivestym, Releuko, Zarxio What should I tell my care team before I take this medication? They need to know if you have any of these conditions: kidney disease latex allergy ongoing radiation therapy sickle cell disease an unusual or allergic reaction to filgrastim, pegfilgrastim, other medicines, foods, dyes, or preservatives pregnant or trying to get pregnant breast-feeding How should I use this medication? This medicine is for injection under the skin or infusion into a vein. As an infusion into a vein, it is usually given by a health care professional in a hospital or clinic setting. If you get this medicine at home, you will be taught how to prepare and give this medicine. Refer to the Instructions for Use that come with your medication packaging. Use exactly as directed. Take your medicine at regular intervals. Do not take your medicine more often than directed. It is important that you put your used needles and syringes in a special sharps container. Do not put them in a trash can. If you do not have a sharps container, call your pharmacist  or healthcare provider to get one. Talk to your pediatrician regarding the use of this medicine in children. While this drug may be prescribed for children as young as 7 months for selected conditions, precautions do apply. Overdosage: If you think you have taken too much of this medicine contact a poison control center or emergency room at once. NOTE: This medicine is only for you. Do not share this medicine with others. What if I miss a dose? It is important not to miss your dose. Call your doctor or health care professional if you miss a dose. What may interact with this medication? This medicine may interact with the following medications: medicines that may cause a release of neutrophils, such as lithium This list may not describe all possible interactions. Give your health care provider a list of all the medicines, herbs, non-prescription drugs, or dietary supplements you use. Also tell them if you smoke, drink alcohol, or use illegal drugs. Some items may interact with your medicine. What should I watch for while using this medication? Your condition will be monitored carefully while you are receiving this medicine. You may need blood work done while you are taking this medicine. Talk to your health care provider about your risk of cancer. You may be more at risk for certain types of cancer if you take this medicine. What side effects may I notice from receiving this medication? Side effects that you should report to your doctor or health care professional as soon as possible: allergic reactions like skin rash, itching or hives, swelling of the face, lips, or tongue back pain dizziness or feeling faint fever pain, redness, or   irritation at site where injected pinpoint red spots on the skin shortness of breath or breathing problems signs and symptoms of kidney injury like trouble passing urine, change in the amount of urine, or red or dark-brown urine stomach or side pain, or pain at  the shoulder swelling tiredness unusual bleeding or bruising Side effects that usually do not require medical attention (report to your doctor or health care professional if they continue or are bothersome): bone pain cough diarrhea hair loss headache muscle pain This list may not describe all possible side effects. Call your doctor for medical advice about side effects. You may report side effects to FDA at 1-800-FDA-1088. Where should I keep my medication? Keep out of the reach of children. Store in a refrigerator between 2 and 8 degrees C (36 and 46 degrees F). Do not freeze. Keep in carton to protect from light. Throw away this medicine if vials or syringes are left out of the refrigerator for more than 24 hours. Throw away any unused medicine after the expiration date. NOTE: This sheet is a summary. It may not cover all possible information. If you have questions about this medicine, talk to your doctor, pharmacist, or health care provider.  2022 Elsevier/Gold Standard (2019-01-17 18:47:55)  

## 2020-07-11 ENCOUNTER — Other Ambulatory Visit: Payer: Self-pay

## 2020-07-11 ENCOUNTER — Inpatient Hospital Stay: Payer: 59

## 2020-07-11 VITALS — BP 112/77 | HR 72 | Temp 98.1°F | Resp 20 | Ht 61.0 in

## 2020-07-11 DIAGNOSIS — D6959 Other secondary thrombocytopenia: Secondary | ICD-10-CM | POA: Diagnosis not present

## 2020-07-11 DIAGNOSIS — C50411 Malignant neoplasm of upper-outer quadrant of right female breast: Secondary | ICD-10-CM | POA: Diagnosis not present

## 2020-07-11 DIAGNOSIS — Z170 Estrogen receptor positive status [ER+]: Secondary | ICD-10-CM | POA: Diagnosis not present

## 2020-07-11 DIAGNOSIS — D6481 Anemia due to antineoplastic chemotherapy: Secondary | ICD-10-CM | POA: Diagnosis not present

## 2020-07-11 DIAGNOSIS — L659 Nonscarring hair loss, unspecified: Secondary | ICD-10-CM | POA: Diagnosis not present

## 2020-07-11 DIAGNOSIS — Z5112 Encounter for antineoplastic immunotherapy: Secondary | ICD-10-CM | POA: Diagnosis not present

## 2020-07-11 DIAGNOSIS — D702 Other drug-induced agranulocytosis: Secondary | ICD-10-CM | POA: Diagnosis not present

## 2020-07-11 DIAGNOSIS — Z5111 Encounter for antineoplastic chemotherapy: Secondary | ICD-10-CM | POA: Diagnosis not present

## 2020-07-11 DIAGNOSIS — R5383 Other fatigue: Secondary | ICD-10-CM | POA: Diagnosis not present

## 2020-07-11 DIAGNOSIS — Z95828 Presence of other vascular implants and grafts: Secondary | ICD-10-CM

## 2020-07-11 MED ORDER — FILGRASTIM-SNDZ 300 MCG/0.5ML IJ SOSY
PREFILLED_SYRINGE | INTRAMUSCULAR | Status: AC
  Filled 2020-07-11: qty 0.5

## 2020-07-11 MED ORDER — FILGRASTIM-SNDZ 300 MCG/0.5ML IJ SOSY
300.0000 ug | PREFILLED_SYRINGE | Freq: Once | INTRAMUSCULAR | Status: AC
  Administered 2020-07-11: 300 ug via SUBCUTANEOUS

## 2020-07-11 NOTE — Patient Instructions (Signed)
Filgrastim, G-CSF injection What is this medication? FILGRASTIM, G-CSF (fil GRA stim) is a granulocyte colony-stimulating factor that stimulates the growth of neutrophils, a type of white blood cell (WBC) important in the body's fight against infection. It is used to reduce the incidence of fever and infection in patients with certain types of cancer who are receiving chemotherapy that affects the bone marrow, to stimulate blood cell production for removal of WBCs from the body prior to a bone marrow transplantation, to reduce the incidence of fever and infection in patients who have severe chronic neutropenia, and to improve survival outcomes following high-dose radiation exposure that is toxic to the bone marrow. This medicine may be used for other purposes; ask your health care provider or pharmacist if you have questions. COMMON BRAND NAME(S): Neupogen, Nivestym, Releuko, Zarxio What should I tell my care team before I take this medication? They need to know if you have any of these conditions: kidney disease latex allergy ongoing radiation therapy sickle cell disease an unusual or allergic reaction to filgrastim, pegfilgrastim, other medicines, foods, dyes, or preservatives pregnant or trying to get pregnant breast-feeding How should I use this medication? This medicine is for injection under the skin or infusion into a vein. As an infusion into a vein, it is usually given by a health care professional in a hospital or clinic setting. If you get this medicine at home, you will be taught how to prepare and give this medicine. Refer to the Instructions for Use that come with your medication packaging. Use exactly as directed. Take your medicine at regular intervals. Do not take your medicine more often than directed. It is important that you put your used needles and syringes in a special sharps container. Do not put them in a trash can. If you do not have a sharps container, call your pharmacist  or healthcare provider to get one. Talk to your pediatrician regarding the use of this medicine in children. While this drug may be prescribed for children as young as 7 months for selected conditions, precautions do apply. Overdosage: If you think you have taken too much of this medicine contact a poison control center or emergency room at once. NOTE: This medicine is only for you. Do not share this medicine with others. What if I miss a dose? It is important not to miss your dose. Call your doctor or health care professional if you miss a dose. What may interact with this medication? This medicine may interact with the following medications: medicines that may cause a release of neutrophils, such as lithium This list may not describe all possible interactions. Give your health care provider a list of all the medicines, herbs, non-prescription drugs, or dietary supplements you use. Also tell them if you smoke, drink alcohol, or use illegal drugs. Some items may interact with your medicine. What should I watch for while using this medication? Your condition will be monitored carefully while you are receiving this medicine. You may need blood work done while you are taking this medicine. Talk to your health care provider about your risk of cancer. You may be more at risk for certain types of cancer if you take this medicine. What side effects may I notice from receiving this medication? Side effects that you should report to your doctor or health care professional as soon as possible: allergic reactions like skin rash, itching or hives, swelling of the face, lips, or tongue back pain dizziness or feeling faint fever pain, redness, or   irritation at site where injected pinpoint red spots on the skin shortness of breath or breathing problems signs and symptoms of kidney injury like trouble passing urine, change in the amount of urine, or red or dark-brown urine stomach or side pain, or pain at  the shoulder swelling tiredness unusual bleeding or bruising Side effects that usually do not require medical attention (report to your doctor or health care professional if they continue or are bothersome): bone pain cough diarrhea hair loss headache muscle pain This list may not describe all possible side effects. Call your doctor for medical advice about side effects. You may report side effects to FDA at 1-800-FDA-1088. Where should I keep my medication? Keep out of the reach of children. Store in a refrigerator between 2 and 8 degrees C (36 and 46 degrees F). Do not freeze. Keep in carton to protect from light. Throw away this medicine if vials or syringes are left out of the refrigerator for more than 24 hours. Throw away any unused medicine after the expiration date. NOTE: This sheet is a summary. It may not cover all possible information. If you have questions about this medicine, talk to your doctor, pharmacist, or health care provider.  2022 Elsevier/Gold Standard (2019-01-17 18:47:55)  

## 2020-07-14 ENCOUNTER — Inpatient Hospital Stay: Payer: 59

## 2020-07-14 ENCOUNTER — Other Ambulatory Visit: Payer: Self-pay

## 2020-07-14 VITALS — BP 118/75 | HR 69 | Temp 98.1°F | Resp 18

## 2020-07-14 DIAGNOSIS — C50411 Malignant neoplasm of upper-outer quadrant of right female breast: Secondary | ICD-10-CM | POA: Diagnosis not present

## 2020-07-14 DIAGNOSIS — D6959 Other secondary thrombocytopenia: Secondary | ICD-10-CM | POA: Diagnosis not present

## 2020-07-14 DIAGNOSIS — Z170 Estrogen receptor positive status [ER+]: Secondary | ICD-10-CM

## 2020-07-14 DIAGNOSIS — Z95828 Presence of other vascular implants and grafts: Secondary | ICD-10-CM

## 2020-07-14 DIAGNOSIS — L659 Nonscarring hair loss, unspecified: Secondary | ICD-10-CM | POA: Diagnosis not present

## 2020-07-14 DIAGNOSIS — R5383 Other fatigue: Secondary | ICD-10-CM | POA: Diagnosis not present

## 2020-07-14 DIAGNOSIS — Z5111 Encounter for antineoplastic chemotherapy: Secondary | ICD-10-CM | POA: Diagnosis not present

## 2020-07-14 DIAGNOSIS — D702 Other drug-induced agranulocytosis: Secondary | ICD-10-CM | POA: Diagnosis not present

## 2020-07-14 DIAGNOSIS — D6481 Anemia due to antineoplastic chemotherapy: Secondary | ICD-10-CM | POA: Diagnosis not present

## 2020-07-14 DIAGNOSIS — Z5112 Encounter for antineoplastic immunotherapy: Secondary | ICD-10-CM | POA: Diagnosis not present

## 2020-07-14 LAB — CBC WITH DIFFERENTIAL (CANCER CENTER ONLY)
Abs Immature Granulocytes: 0.03 10*3/uL (ref 0.00–0.07)
Basophils Absolute: 0 10*3/uL (ref 0.0–0.1)
Basophils Relative: 1 %
Eosinophils Absolute: 0 10*3/uL (ref 0.0–0.5)
Eosinophils Relative: 0 %
HCT: 28.8 % — ABNORMAL LOW (ref 36.0–46.0)
Hemoglobin: 9.9 g/dL — ABNORMAL LOW (ref 12.0–15.0)
Immature Granulocytes: 1 %
Lymphocytes Relative: 33 %
Lymphs Abs: 1.1 10*3/uL (ref 0.7–4.0)
MCH: 33.2 pg (ref 26.0–34.0)
MCHC: 34.4 g/dL (ref 30.0–36.0)
MCV: 96.6 fL (ref 80.0–100.0)
Monocytes Absolute: 0.4 10*3/uL (ref 0.1–1.0)
Monocytes Relative: 13 %
Neutro Abs: 1.8 10*3/uL (ref 1.7–7.7)
Neutrophils Relative %: 52 %
Platelet Count: ADEQUATE 10*3/uL (ref 150–400)
RBC: 2.98 MIL/uL — ABNORMAL LOW (ref 3.87–5.11)
RDW: 15 % (ref 11.5–15.5)
WBC Count: 3.4 10*3/uL — ABNORMAL LOW (ref 4.0–10.5)
nRBC: 0 % (ref 0.0–0.2)

## 2020-07-14 LAB — CMP (CANCER CENTER ONLY)
ALT: 39 U/L (ref 0–44)
AST: 32 U/L (ref 15–41)
Albumin: 4 g/dL (ref 3.5–5.0)
Alkaline Phosphatase: 104 U/L (ref 38–126)
Anion gap: 7 (ref 5–15)
BUN: 8 mg/dL (ref 6–20)
CO2: 28 mmol/L (ref 22–32)
Calcium: 9.3 mg/dL (ref 8.9–10.3)
Chloride: 105 mmol/L (ref 98–111)
Creatinine: 0.62 mg/dL (ref 0.44–1.00)
GFR, Estimated: >60 mL/min (ref >60)
Glucose, Bld: 98 mg/dL (ref 70–99)
Potassium: 4 mmol/L (ref 3.5–5.1)
Sodium: 140 mmol/L (ref 135–145)
Total Bilirubin: <0.2 mg/dL — ABNORMAL LOW (ref 0.3–1.2)
Total Protein: 7.2 g/dL (ref 6.5–8.1)

## 2020-07-14 LAB — PREGNANCY, URINE: Preg Test, Ur: NEGATIVE

## 2020-07-14 LAB — TSH: TSH: 0.454 u[IU]/mL (ref 0.308–3.960)

## 2020-07-14 MED ORDER — SODIUM CHLORIDE 0.9% FLUSH
10.0000 mL | INTRAVENOUS | Status: DC | PRN
  Administered 2020-07-14: 10 mL
  Filled 2020-07-14: qty 10

## 2020-07-14 MED ORDER — DIPHENHYDRAMINE HCL 50 MG/ML IJ SOLN
25.0000 mg | Freq: Once | INTRAMUSCULAR | Status: AC
  Administered 2020-07-14: 25 mg via INTRAVENOUS

## 2020-07-14 MED ORDER — DIPHENHYDRAMINE HCL 50 MG/ML IJ SOLN
INTRAMUSCULAR | Status: AC
  Filled 2020-07-14: qty 1

## 2020-07-14 MED ORDER — SODIUM CHLORIDE 0.9 % IV SOLN
65.0000 mg/m2 | Freq: Once | INTRAVENOUS | Status: AC
  Administered 2020-07-14: 102 mg via INTRAVENOUS
  Filled 2020-07-14: qty 17

## 2020-07-14 MED ORDER — ONDANSETRON HCL 4 MG/2ML IJ SOLN
INTRAMUSCULAR | Status: AC
  Filled 2020-07-14: qty 2

## 2020-07-14 MED ORDER — SODIUM CHLORIDE 0.9 % IV SOLN
Freq: Once | INTRAVENOUS | Status: AC
  Filled 2020-07-14: qty 250

## 2020-07-14 MED ORDER — ONDANSETRON HCL 4 MG/2ML IJ SOLN
8.0000 mg | Freq: Once | INTRAMUSCULAR | Status: AC
  Administered 2020-07-14: 8 mg via INTRAVENOUS

## 2020-07-14 MED ORDER — SODIUM CHLORIDE 0.9% FLUSH
10.0000 mL | Freq: Once | INTRAVENOUS | Status: AC
Start: 2020-07-14 — End: 2020-07-14
  Administered 2020-07-14: 10 mL
  Filled 2020-07-14: qty 10

## 2020-07-14 MED ORDER — FAMOTIDINE 20 MG IN NS 100 ML IVPB
INTRAVENOUS | Status: AC
  Filled 2020-07-14: qty 100

## 2020-07-14 MED ORDER — SODIUM CHLORIDE 0.9 % IV SOLN
10.0000 mg | Freq: Once | INTRAVENOUS | Status: AC
  Administered 2020-07-14: 10 mg via INTRAVENOUS
  Filled 2020-07-14: qty 1
  Filled 2020-07-14: qty 10
  Filled 2020-07-14: qty 1

## 2020-07-14 MED ORDER — FAMOTIDINE 20 MG IN NS 100 ML IVPB
20.0000 mg | Freq: Once | INTRAVENOUS | Status: AC
  Administered 2020-07-14: 20 mg via INTRAVENOUS

## 2020-07-14 MED ORDER — HEPARIN SOD (PORK) LOCK FLUSH 100 UNIT/ML IV SOLN
500.0000 [IU] | Freq: Once | INTRAVENOUS | Status: AC | PRN
  Administered 2020-07-14: 500 [IU]
  Filled 2020-07-14: qty 5

## 2020-07-14 NOTE — Patient Instructions (Signed)
[facility] ONCOLOGY  Discharge Instructions: Thank you for choosing [facility] to provide your oncology and hematology care.   If you have a lab appointment with the [facility], please go directly to the [facility] and check in at the registration area.   Wear comfortable clothing and clothing appropriate for easy access to any Portacath or PICC line.   We strive to give you quality time with your provider. You may need to reschedule your appointment if you arrive late (15 or more minutes).  Arriving late affects you and other patients whose appointments are after yours.  Also, if you miss three or more appointments without notifying the office, you may be dismissed from the clinic at the provider's discretion.      For prescription refill requests, have your pharmacy contact our office and allow 72 hours for refills to be completed.    Today you received the following chemotherapy and/or immunotherapy agents :Taxol     To help prevent nausea and vomiting after your treatment, we encourage you to take your nausea medication as directed.  BELOW ARE SYMPTOMS THAT SHOULD BE REPORTED IMMEDIATELY: *FEVER GREATER THAN 100.4 F (38 C) OR HIGHER *CHILLS OR SWEATING *NAUSEA AND VOMITING THAT IS NOT CONTROLLED WITH YOUR NAUSEA MEDICATION *UNUSUAL SHORTNESS OF BREATH *UNUSUAL BRUISING OR BLEEDING *URINARY PROBLEMS (pain or burning when urinating, or frequent urination) *BOWEL PROBLEMS (unusual diarrhea, constipation, pain near the anus) TENDERNESS IN MOUTH AND THROAT WITH OR WITHOUT PRESENCE OF ULCERS (sore throat, sores in mouth, or a toothache) UNUSUAL RASH, SWELLING OR PAIN  UNUSUAL VAGINAL DISCHARGE OR ITCHING   Items with * indicate a potential emergency and should be followed up as soon as possible or go to the Emergency Department if any problems should occur.  Please show the CHEMOTHERAPY ALERT CARD or IMMUNOTHERAPY ALERT CARD at check-in to the  Emergency Department and triage nurse.  Should you have questions after your visit or need to cancel or reschedule your appointment, please contact [facility]  Dept: [phone]  and follow the prompts.  Office hours are 8:00 a.m. to 4:30 p.m. Monday - Friday. Please note that voicemails left after 4:00 p.m. may not be returned until the following business day.  We are closed weekends and major holidays. You have access to a nurse at all times for urgent questions. Please call the main number to the clinic Dept: [phone] and follow the prompts.   For any non-urgent questions, you may also contact your provider using MyChart. We now offer e-Visits for anyone 39 and older to request care online for non-urgent symptoms. For details visit [email or web address].   Also download the MyChart app! Go to the app store, search "MyChart", open the app, select [facility], and log in with your MyChart username and password.  Due to Covid, a mask is required upon entering the hospital/clinic. If you do not have a mask, one will be given to you upon arrival. For doctor visits, patients may have 1 support person aged 6 or older with them. For treatment visits, patients cannot have anyone with them due to current Covid guidelines and our immunocompromised population.

## 2020-07-15 ENCOUNTER — Telehealth: Payer: Self-pay | Admitting: Hematology and Oncology

## 2020-07-15 NOTE — Telephone Encounter (Signed)
R/s appts per 7/5 sch msg. Called pt, no answer. Left msg with appts dates and times.

## 2020-07-18 ENCOUNTER — Ambulatory Visit: Payer: 59

## 2020-07-27 ENCOUNTER — Ambulatory Visit: Payer: 59

## 2020-07-27 ENCOUNTER — Other Ambulatory Visit: Payer: Self-pay

## 2020-07-27 ENCOUNTER — Encounter: Payer: Self-pay | Admitting: *Deleted

## 2020-07-27 ENCOUNTER — Inpatient Hospital Stay: Payer: 59

## 2020-07-27 VITALS — BP 109/94 | HR 71 | Temp 98.7°F | Resp 18

## 2020-07-27 DIAGNOSIS — L659 Nonscarring hair loss, unspecified: Secondary | ICD-10-CM | POA: Diagnosis not present

## 2020-07-27 DIAGNOSIS — Z95828 Presence of other vascular implants and grafts: Secondary | ICD-10-CM

## 2020-07-27 DIAGNOSIS — C50411 Malignant neoplasm of upper-outer quadrant of right female breast: Secondary | ICD-10-CM | POA: Diagnosis not present

## 2020-07-27 DIAGNOSIS — Z5112 Encounter for antineoplastic immunotherapy: Secondary | ICD-10-CM | POA: Diagnosis not present

## 2020-07-27 DIAGNOSIS — Z5111 Encounter for antineoplastic chemotherapy: Secondary | ICD-10-CM | POA: Diagnosis not present

## 2020-07-27 DIAGNOSIS — D6959 Other secondary thrombocytopenia: Secondary | ICD-10-CM | POA: Diagnosis not present

## 2020-07-27 DIAGNOSIS — Z170 Estrogen receptor positive status [ER+]: Secondary | ICD-10-CM | POA: Diagnosis not present

## 2020-07-27 DIAGNOSIS — D702 Other drug-induced agranulocytosis: Secondary | ICD-10-CM | POA: Diagnosis not present

## 2020-07-27 DIAGNOSIS — R5383 Other fatigue: Secondary | ICD-10-CM | POA: Diagnosis not present

## 2020-07-27 DIAGNOSIS — D6481 Anemia due to antineoplastic chemotherapy: Secondary | ICD-10-CM | POA: Diagnosis not present

## 2020-07-27 MED ORDER — FILGRASTIM-SNDZ 300 MCG/0.5ML IJ SOSY
PREFILLED_SYRINGE | INTRAMUSCULAR | Status: AC
  Filled 2020-07-27: qty 0.5

## 2020-07-27 MED ORDER — FILGRASTIM-SNDZ 300 MCG/0.5ML IJ SOSY
300.0000 ug | PREFILLED_SYRINGE | Freq: Once | INTRAMUSCULAR | Status: AC
Start: 2020-07-27 — End: 2020-07-27
  Administered 2020-07-27: 300 ug via SUBCUTANEOUS

## 2020-07-28 ENCOUNTER — Other Ambulatory Visit: Payer: 59

## 2020-07-28 ENCOUNTER — Inpatient Hospital Stay: Payer: 59

## 2020-07-28 ENCOUNTER — Ambulatory Visit: Payer: 59 | Admitting: Hematology and Oncology

## 2020-07-28 ENCOUNTER — Other Ambulatory Visit: Payer: Self-pay

## 2020-07-28 ENCOUNTER — Ambulatory Visit: Payer: 59

## 2020-07-28 VITALS — BP 110/79 | HR 71 | Temp 98.7°F | Resp 18

## 2020-07-28 DIAGNOSIS — C50411 Malignant neoplasm of upper-outer quadrant of right female breast: Secondary | ICD-10-CM

## 2020-07-28 DIAGNOSIS — Z170 Estrogen receptor positive status [ER+]: Secondary | ICD-10-CM

## 2020-07-28 DIAGNOSIS — R5383 Other fatigue: Secondary | ICD-10-CM | POA: Diagnosis not present

## 2020-07-28 DIAGNOSIS — L659 Nonscarring hair loss, unspecified: Secondary | ICD-10-CM | POA: Diagnosis not present

## 2020-07-28 DIAGNOSIS — Z95828 Presence of other vascular implants and grafts: Secondary | ICD-10-CM

## 2020-07-28 DIAGNOSIS — Z5111 Encounter for antineoplastic chemotherapy: Secondary | ICD-10-CM | POA: Diagnosis not present

## 2020-07-28 DIAGNOSIS — D6959 Other secondary thrombocytopenia: Secondary | ICD-10-CM | POA: Diagnosis not present

## 2020-07-28 DIAGNOSIS — Z5112 Encounter for antineoplastic immunotherapy: Secondary | ICD-10-CM | POA: Diagnosis not present

## 2020-07-28 DIAGNOSIS — D702 Other drug-induced agranulocytosis: Secondary | ICD-10-CM | POA: Diagnosis not present

## 2020-07-28 DIAGNOSIS — D6481 Anemia due to antineoplastic chemotherapy: Secondary | ICD-10-CM | POA: Diagnosis not present

## 2020-07-28 MED ORDER — FILGRASTIM-SNDZ 300 MCG/0.5ML IJ SOSY
PREFILLED_SYRINGE | INTRAMUSCULAR | Status: AC
  Filled 2020-07-28: qty 0.5

## 2020-07-28 MED ORDER — FILGRASTIM-SNDZ 480 MCG/0.8ML IJ SOSY
PREFILLED_SYRINGE | INTRAMUSCULAR | Status: AC
  Filled 2020-07-28: qty 0.8

## 2020-07-28 MED ORDER — FILGRASTIM-SNDZ 300 MCG/0.5ML IJ SOSY
300.0000 ug | PREFILLED_SYRINGE | Freq: Once | INTRAMUSCULAR | Status: AC
  Administered 2020-07-28: 300 ug via SUBCUTANEOUS

## 2020-07-28 NOTE — Progress Notes (Signed)
Patient Care Team: Emeterio Reeve, DO as PCP - General (Osteopathic Medicine) [name], MD as Consulting Physician (Obstetrics and Gynecology) Mauro Kaufmann, RN as Oncology Nurse Navigator Rockwell Germany, RN as Oncology Nurse Navigator  DIAGNOSIS:    ICD-10-CM   1. Malignant neoplasm of upper-outer quadrant of right breast in female, estrogen receptor positive ([place])  C50.411 MR BREAST BILATERAL W WO CONTRAST INC CAD   Z17.0       SUMMARY OF ONCOLOGIC HISTORY: Oncology History  Malignant neoplasm of upper outer quadrant of female breast ([place])  01/28/2020 Initial Diagnosis   Patient palpated a right breast lump. Diagnostic mammogram and US showed calcifications spanning 2.0cm in the right breast. Biopsy showed invasive and in situ carcinoma, grade 2. ER 5% weak, PR 0% negative, HER2 equiv, Ki 10%     01/28/2020 Cancer Staging   Staging form: Breast, AJCC 8th Edition - Clinical stage from 01/28/2020: Stage IA (cT1c, cN0, cM0, G2, ER+, PR-, HER2: Equivocal) - Signed by Nicholas Lose, MD on 01/29/2020    02/05/2020 -  Chemotherapy    Patient is on Treatment Plan: BREAST DOSE DENSE AC Q14D / CARBOPLATIN D1 + PACLITAXEL D1,8,15 Q21D       02/13/2020 Genetic Testing   Positive genetic testing:  A single, heterozygous, pathogenic variant was detected in the BRCA2 gene called c.2808_2811delACAA. Testing was completed through the CustomNext-Cancer + RNAinsight panel offered by [facility]. 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 The report date is 02/13/2020.  The CustomNext-Cancer+RNAinsight panel offered by [facility] includes sequencing and rearrangement analysis for the following 47 genes:  APC, ATM, AXIN2, BARD1, BMPR1A, BRCA1, BRCA2, BRIP1, CDH1, CDK4, CDKN2A, CHEK2, DICER1, EPCAM, GREM1, HOXB13, MEN1, MLH1, MSH2, MSH3, MSH6, MUTYH, NBN, NF1, NF2, NTHL1, PALB2, PMS2, POLD1, POLE, PTEN, RAD51C,  RAD51D, RECQL, RET, SDHA, SDHAF2, SDHB, SDHC, SDHD, SMAD4, SMARCA4, STK11, TP53, TSC1, TSC2, and VHL.  RNA data is routinely analyzed for use in variant interpretation for all genes.     CHIEF COMPLIANT:  Cycle 9 Taxol (Keytruda and Carboplatin every 3 weeks)  INTERVAL HISTORY: Sydney Herman is a 40 y.o. with above-mentioned history of  right cancer currently on neoadjuvant chemotherapy with Taxol, Keytruda, and Carboplatin after completing 4 cycles of Adriamycin/Cytoxan/Pembrolizumab. She presents to the clinic today for a toxicity check and cycle 9.  She is tolerating Taxol extremely well.  She had a week of last week to spend time with her family in [place] and [place].  She feels very rejuvenated and energized to continue and finish up the rest of the treatment.  Does not have any peripheral neuropathy.  ALLERGIES:  is allergic to amoxil [amoxicillin].  MEDICATIONS:  Current Outpatient Medications  Medication Sig Dispense Refill   cycloSPORINE (RESTASIS) 0.05 % ophthalmic emulsion INSILL 1 DROP INTO BOTH EYES TWICE DAILY 180 mL 4   doxycycline (VIBRA-TABS) 100 MG tablet TAKE 1 TABLET BY MOUTH 2 TIMES DAILY FOR 7 DAYS 14 tablet 0   escitalopram (LEXAPRO) 10 MG tablet Take 1 tablet (10 mg total) by mouth daily. 90 tablet 3   lidocaine-prilocaine (EMLA) cream APPLY TO AFFECTED AREA ONCE 30 g 3   loratadine (CLARITIN) 10 MG tablet 10 mg.     ondansetron (ZOFRAN) 8 MG tablet TAKE 1 TABLET BY MOUTH 2 TIMES DAILY AS NEEDED START ON THE THIRD DAY AFTER CHEMO 30 tablet 1   prochlorperazine (COMPAZINE) 10 MG tablet TAKE 1 TABLET BY  MOUTH EVERY 6 HOURS AS NEEDED FOR NAUSEA OR VOMITING 30 tablet 1   RESTASIS 0.05 % ophthalmic emulsion Place 1 drop into the right eye 2 (two) times daily.     No current facility-administered medications for this visit.    PHYSICAL EXAMINATION: ECOG PERFORMANCE STATUS: 1 - Symptomatic but completely ambulatory  Vitals:   07/29/20 0939  BP: 111/70  Pulse: 84  Resp:  18  Temp: 97.6 F (36.4 C)  SpO2: 100%   There were no vitals filed for this visit.  LABORATORY DATA:  I have reviewed the data as listed CMP Latest Ref Rng & Units 07/14/2020 07/07/2020 07/01/2020  Glucose 70 - 99 mg/dL 98 91 85  BUN 6 - 20 mg/dL _0 Creatinine 0.44 - 1.00 mg/dL 0.62 0.67 0.69  Sodium 135 - 145 mmol/L 140 140 136  Potassium 3.5 - 5.1 mmol/L 4.0 4.0 3.8  Chloride 98 - 111 mmol/L 105 103 102  CO2 22 - 32 mmol/L _1 Calcium 8.9 - 10.3 mg/dL 9.3 9.4 9.4  Total Protein 6.5 - 8.1 g/dL 7.2 7.5 7.4  Total Bilirubin 0.3 - 1.2 mg/dL <0.2(L) 0.3 <0.2(L)  Alkaline Phos 38 - 126 U/L 104 83 87  AST 15 - 41 U/L 32 26 27  ALT 0 - 44 U/L 39 26 30    Lab Results  Component Value Date   WBC 19.9 (H) 07/29/2020   HGB 9.8 (L) 07/29/2020   HCT 28.5 (L) 07/29/2020   MCV 98.3 07/29/2020   PLT 124 (L) 07/29/2020   NEUTROABS PENDING 07/29/2020    ASSESSMENT & PLAN:  Malignant neoplasm of upper outer quadrant of female breast ([place]) 01/28/2020: Palpable right breast lump: Mammogram and ultrasound revealed calcifications spanning 2 cm, biopsy revealed IDC with DCIS, grade 2, 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     Treatment plan: 1. Neo- adjuvant chemotherapy with dose dense Adriamycin and Cytoxan/Keytruda followed by Taxol and carboplatin 2. bilateral mastectomies with sentinel lymph node biopsy 3. Adjuvant radiation 4. followed by adjuvant antiestrogen therapy 5.  Genetic testing _______________________________________________________________________ Current Treatment: Completed 4 cycles of neoadjuvant chemotherapy with Adriamycin/Cytoxan/Keytruda, today cycle 9 Taxol (carboplatin and Keytruda every 3 weeks)   Chemo toxicities: 1.  Alopecia 2. fatigue: Stable 3.   Chemotherapy-induced anemia: Hemoglobin 9.8 monitoring 4.  Thrombocytopenia: Today's platelet count is 125 5.  Leukopenia/neutropenia: We are administering Granix (2 injections) prior to each chemo.    I schedule her for breast MRI after the finish of chemotherapy and I will request Dr. Donne Hazel to see her after that. Denies any peripheral neuropathy. Monitoring closely for toxicities.    Orders Placed This Encounter  Procedures   MR BREAST BILATERAL W WO CONTRAST INC CAD    Standing Status:   Future    Standing Expiration Date:   07/29/2021    Order Specific Question:   If indicated for the ordered procedure, I authorize the administration of contrast media per Radiology protocol    Answer:   Yes    Order Specific Question:   What is the patient's sedation requirement?    Answer:   No Sedation    Order Specific Question:   Does the patient have a pacemaker or implanted devices?    Answer:   No    Order  Specific Question:   Preferred imaging location?    Answer:   [facility] (table limit-550lbs)    Order Specific Question:   Release to patient    Answer:   Immediate   The patient has a good understanding of the overall plan. she agrees with it. she will call with any problems that may develop before the next visit here.  Total time spent: 30 mins including face to face time and time spent for planning, charting and coordination of care  Rulon Eisenmenger, MD, MPH 07/29/2020  I, Thana Ates, am acting as scribe for Dr. Nicholas Lose.  I have reviewed the above documentation for accuracy and completeness, and I agree with the above.

## 2020-07-29 ENCOUNTER — Inpatient Hospital Stay (HOSPITAL_BASED_OUTPATIENT_CLINIC_OR_DEPARTMENT_OTHER): Payer: 59 | Admitting: Hematology and Oncology

## 2020-07-29 ENCOUNTER — Inpatient Hospital Stay: Payer: 59

## 2020-07-29 ENCOUNTER — Ambulatory Visit: Payer: 59

## 2020-07-29 DIAGNOSIS — L659 Nonscarring hair loss, unspecified: Secondary | ICD-10-CM | POA: Diagnosis not present

## 2020-07-29 DIAGNOSIS — Z170 Estrogen receptor positive status [ER+]: Secondary | ICD-10-CM

## 2020-07-29 DIAGNOSIS — C50411 Malignant neoplasm of upper-outer quadrant of right female breast: Secondary | ICD-10-CM

## 2020-07-29 DIAGNOSIS — D6959 Other secondary thrombocytopenia: Secondary | ICD-10-CM | POA: Diagnosis not present

## 2020-07-29 DIAGNOSIS — R5383 Other fatigue: Secondary | ICD-10-CM | POA: Diagnosis not present

## 2020-07-29 DIAGNOSIS — Z5112 Encounter for antineoplastic immunotherapy: Secondary | ICD-10-CM | POA: Diagnosis not present

## 2020-07-29 DIAGNOSIS — D702 Other drug-induced agranulocytosis: Secondary | ICD-10-CM | POA: Diagnosis not present

## 2020-07-29 DIAGNOSIS — Z5111 Encounter for antineoplastic chemotherapy: Secondary | ICD-10-CM | POA: Diagnosis not present

## 2020-07-29 DIAGNOSIS — D6481 Anemia due to antineoplastic chemotherapy: Secondary | ICD-10-CM | POA: Diagnosis not present

## 2020-07-29 DIAGNOSIS — Z95828 Presence of other vascular implants and grafts: Secondary | ICD-10-CM

## 2020-07-29 LAB — CMP (CANCER CENTER ONLY)
ALT: 33 U/L (ref 0–44)
AST: 31 U/L (ref 15–41)
Albumin: 4.3 g/dL (ref 3.5–5.0)
Alkaline Phosphatase: 86 U/L (ref 38–126)
Anion gap: 11 (ref 5–15)
BUN: 6 mg/dL (ref 6–20)
CO2: 29 mmol/L (ref 22–32)
Calcium: 9.7 mg/dL (ref 8.9–10.3)
Chloride: 102 mmol/L (ref 98–111)
Creatinine: 0.58 mg/dL (ref 0.44–1.00)
GFR, Estimated: >60 mL/min (ref >60)
Glucose, Bld: 102 mg/dL — ABNORMAL HIGH (ref 70–99)
Potassium: 3.9 mmol/L (ref 3.5–5.1)
Sodium: 142 mmol/L (ref 135–145)
Total Bilirubin: 0.5 mg/dL (ref 0.3–1.2)
Total Protein: 7.3 g/dL (ref 6.5–8.1)

## 2020-07-29 LAB — CBC WITH DIFFERENTIAL (CANCER CENTER ONLY)
Abs Immature Granulocytes: 1.8 10*3/uL — ABNORMAL HIGH (ref 0.00–0.07)
Basophils Absolute: 0.1 10*3/uL (ref 0.0–0.1)
Basophils Relative: 0 %
Eosinophils Absolute: 0 10*3/uL (ref 0.0–0.5)
Eosinophils Relative: 0 %
HCT: 28.5 % — ABNORMAL LOW (ref 36.0–46.0)
Hemoglobin: 9.8 g/dL — ABNORMAL LOW (ref 12.0–15.0)
Immature Granulocytes: 9 %
Lymphocytes Relative: 7 %
Lymphs Abs: 1.4 10*3/uL (ref 0.7–4.0)
MCH: 33.8 pg (ref 26.0–34.0)
MCHC: 34.4 g/dL (ref 30.0–36.0)
MCV: 98.3 fL (ref 80.0–100.0)
Monocytes Absolute: 1.3 10*3/uL — ABNORMAL HIGH (ref 0.1–1.0)
Monocytes Relative: 7 %
Neutro Abs: 15.3 10*3/uL — ABNORMAL HIGH (ref 1.7–7.7)
Neutrophils Relative %: 77 %
Platelet Count: 124 10*3/uL — ABNORMAL LOW (ref 150–400)
RBC: 2.9 MIL/uL — ABNORMAL LOW (ref 3.87–5.11)
RDW: 16.8 % — ABNORMAL HIGH (ref 11.5–15.5)
WBC Count: 19.9 10*3/uL — ABNORMAL HIGH (ref 4.0–10.5)
nRBC: 0 % (ref 0.0–0.2)

## 2020-07-29 LAB — TSH: TSH: 0.62 u[IU]/mL (ref 0.308–3.960)

## 2020-07-29 MED ORDER — ONDANSETRON HCL 4 MG/2ML IJ SOLN
INTRAMUSCULAR | Status: AC
  Filled 2020-07-29: qty 4

## 2020-07-29 MED ORDER — SODIUM CHLORIDE 0.9 % IV SOLN
65.0000 mg/m2 | Freq: Once | INTRAVENOUS | Status: AC
  Administered 2020-07-29: 102 mg via INTRAVENOUS
  Filled 2020-07-29: qty 17

## 2020-07-29 MED ORDER — DIPHENHYDRAMINE HCL 50 MG/ML IJ SOLN
25.0000 mg | Freq: Once | INTRAMUSCULAR | Status: AC
  Administered 2020-07-29: 25 mg via INTRAVENOUS

## 2020-07-29 MED ORDER — SODIUM CHLORIDE 0.9 % IV SOLN
10.0000 mg | Freq: Once | INTRAVENOUS | Status: AC
  Administered 2020-07-29: 10 mg via INTRAVENOUS
  Filled 2020-07-29: qty 10

## 2020-07-29 MED ORDER — SODIUM CHLORIDE 0.9% FLUSH
10.0000 mL | Freq: Once | INTRAVENOUS | Status: AC
  Administered 2020-07-29: 10 mL
  Filled 2020-07-29: qty 10

## 2020-07-29 MED ORDER — FAMOTIDINE 20 MG IN NS 100 ML IVPB
INTRAVENOUS | Status: AC
  Filled 2020-07-29: qty 100

## 2020-07-29 MED ORDER — FAMOTIDINE 20 MG IN NS 100 ML IVPB
20.0000 mg | Freq: Once | INTRAVENOUS | Status: AC
  Administered 2020-07-29: 20 mg via INTRAVENOUS

## 2020-07-29 MED ORDER — HEPARIN SOD (PORK) LOCK FLUSH 100 UNIT/ML IV SOLN
500.0000 [IU] | Freq: Once | INTRAVENOUS | Status: AC | PRN
  Administered 2020-07-29: 500 [IU]
  Filled 2020-07-29: qty 5

## 2020-07-29 MED ORDER — COLD PACK MISC ONCOLOGY
1.0000 | Freq: Once | Status: DC | PRN
  Filled 2020-07-29: qty 1

## 2020-07-29 MED ORDER — SODIUM CHLORIDE 0.9 % IV SOLN
Freq: Once | INTRAVENOUS | Status: AC
  Filled 2020-07-29: qty 250

## 2020-07-29 MED ORDER — SODIUM CHLORIDE 0.9% FLUSH
10.0000 mL | INTRAVENOUS | Status: DC | PRN
  Administered 2020-07-29: 10 mL
  Filled 2020-07-29: qty 10

## 2020-07-29 MED ORDER — DIPHENHYDRAMINE HCL 50 MG/ML IJ SOLN
INTRAMUSCULAR | Status: AC
  Filled 2020-07-29: qty 1

## 2020-07-29 MED ORDER — ONDANSETRON HCL 4 MG/2ML IJ SOLN
8.0000 mg | Freq: Once | INTRAMUSCULAR | Status: AC
  Administered 2020-07-29: 8 mg via INTRAVENOUS

## 2020-07-29 NOTE — Patient Instructions (Signed)
[facility]  Discharge Instructions: Thank you for choosing [facility] to provide your oncology and hematology care.   If you have a lab appointment with the [facility], please go directly to the [facility] and check in at the registration area.   Wear comfortable clothing and clothing appropriate for easy access to any Portacath or PICC line.   We strive to give you quality time with your provider. You may need to reschedule your appointment if you arrive late (15 or more minutes).  Arriving late affects you and other patients whose appointments are after yours.  Also, if you miss three or more appointments without notifying the office, you may be dismissed from the clinic at the provider's discretion.      For prescription refill requests, have your pharmacy contact our office and allow 72 hours for refills to be completed.    Today you received the following chemotherapy and/or immunotherapy agents Taxol      To help prevent nausea and vomiting after your treatment, we encourage you to take your nausea medication as directed.  BELOW ARE SYMPTOMS THAT SHOULD BE REPORTED IMMEDIATELY: *FEVER GREATER THAN 100.4 F (38 C) OR HIGHER *CHILLS OR SWEATING *NAUSEA AND VOMITING THAT IS NOT CONTROLLED WITH YOUR NAUSEA MEDICATION *UNUSUAL SHORTNESS OF BREATH *UNUSUAL BRUISING OR BLEEDING *URINARY PROBLEMS (pain or burning when urinating, or frequent urination) *BOWEL PROBLEMS (unusual diarrhea, constipation, pain near the anus) TENDERNESS IN MOUTH AND THROAT WITH OR WITHOUT PRESENCE OF ULCERS (sore throat, sores in mouth, or a toothache) UNUSUAL RASH, SWELLING OR PAIN  UNUSUAL VAGINAL DISCHARGE OR ITCHING   Items with * indicate a potential emergency and should be followed up as soon as possible or go to the Emergency Department if any problems should occur.  Please show the CHEMOTHERAPY ALERT CARD or IMMUNOTHERAPY ALERT CARD at check-in to the  Emergency Department and triage nurse.  Should you have questions after your visit or need to cancel or reschedule your appointment, please contact [facility]  Dept: [phone]  and follow the prompts.  Office hours are 8:00 a.m. to 4:30 p.m. Monday - Friday. Please note that voicemails left after 4:00 p.m. may not be returned until the following business day.  We are closed weekends and major holidays. You have access to a nurse at all times for urgent questions. Please call the main number to the clinic Dept: [phone] and follow the prompts.   For any non-urgent questions, you may also contact your provider using MyChart. We now offer e-Visits for anyone 47 and older to request care online for non-urgent symptoms. For details visit [email or web address].   Also download the MyChart app! Go to the app store, search "MyChart", open the app, select [facility], and log in with your MyChart username and password.  Due to Covid, a mask is required upon entering the hospital/clinic. If you do not have a mask, one will be given to you upon arrival. For doctor visits, patients may have 1 support person aged 91 or older with them. For treatment visits, patients cannot have anyone with them due to current Covid guidelines and our immunocompromised population.

## 2020-07-29 NOTE — Assessment & Plan Note (Signed)
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  Treatment plan: 1. Neo- adjuvant chemotherapy with dose dense Adriamycin and Cytoxan/Keytrudafollowed by Taxol and carboplatin 2.bilateral mastectomieswith sentinel lymph node biopsy 3. Adjuvant radiation 4. followed by adjuvant antiestrogen therapy 5. Genetic testing _______________________________________________________________________ Current Treatment:Completed 4 cycles of neoadjuvant chemotherapy with Adriamycin/Cytoxan/Keytruda,today cycle9Taxol (carboplatin and Keytruda every 3 weeks)  Chemo toxicities: 1.Alopecia 2.fatigue: Stable 3.Chemotherapy-induced anemia: Hemoglobin10.2: Monitoring 4.Thrombocytopenia:Today's platelet count is159 5.Leukopenia/neutropenia:We are administering Granix(2injections)prior to each chemo.Today's ANC is 1.1    Denies any peripheral neuropathy. Monitoring closely for toxicities.

## 2020-07-30 ENCOUNTER — Encounter: Payer: Self-pay | Admitting: *Deleted

## 2020-07-31 ENCOUNTER — Inpatient Hospital Stay: Payer: 59

## 2020-07-31 ENCOUNTER — Other Ambulatory Visit: Payer: Self-pay

## 2020-07-31 ENCOUNTER — Ambulatory Visit: Payer: 59

## 2020-07-31 VITALS — BP 113/78 | HR 85 | Temp 98.3°F | Resp 16

## 2020-07-31 DIAGNOSIS — C50411 Malignant neoplasm of upper-outer quadrant of right female breast: Secondary | ICD-10-CM | POA: Diagnosis not present

## 2020-07-31 DIAGNOSIS — Z170 Estrogen receptor positive status [ER+]: Secondary | ICD-10-CM | POA: Diagnosis not present

## 2020-07-31 DIAGNOSIS — D6959 Other secondary thrombocytopenia: Secondary | ICD-10-CM | POA: Diagnosis not present

## 2020-07-31 DIAGNOSIS — Z5112 Encounter for antineoplastic immunotherapy: Secondary | ICD-10-CM | POA: Diagnosis not present

## 2020-07-31 DIAGNOSIS — D702 Other drug-induced agranulocytosis: Secondary | ICD-10-CM | POA: Diagnosis not present

## 2020-07-31 DIAGNOSIS — R5383 Other fatigue: Secondary | ICD-10-CM | POA: Diagnosis not present

## 2020-07-31 DIAGNOSIS — D6481 Anemia due to antineoplastic chemotherapy: Secondary | ICD-10-CM | POA: Diagnosis not present

## 2020-07-31 DIAGNOSIS — L659 Nonscarring hair loss, unspecified: Secondary | ICD-10-CM | POA: Diagnosis not present

## 2020-07-31 DIAGNOSIS — Z5111 Encounter for antineoplastic chemotherapy: Secondary | ICD-10-CM | POA: Diagnosis not present

## 2020-07-31 DIAGNOSIS — Z95828 Presence of other vascular implants and grafts: Secondary | ICD-10-CM

## 2020-07-31 MED ORDER — FILGRASTIM-SNDZ 300 MCG/0.5ML IJ SOSY
PREFILLED_SYRINGE | INTRAMUSCULAR | Status: AC
  Filled 2020-07-31: qty 0.5

## 2020-07-31 MED ORDER — FILGRASTIM-SNDZ 300 MCG/0.5ML IJ SOSY
300.0000 ug | PREFILLED_SYRINGE | Freq: Once | INTRAMUSCULAR | Status: AC
  Administered 2020-07-31: 300 ug via SUBCUTANEOUS

## 2020-07-31 NOTE — Patient Instructions (Signed)
Filgrastim, G-CSF injection What is this medication? FILGRASTIM, G-CSF (fil GRA stim) is a granulocyte colony-stimulating factor that stimulates the growth of neutrophils, a type of white blood cell (WBC) important in the body's fight against infection. It is used to reduce the incidence of fever and infection in patients with certain types of cancer who are receiving chemotherapy that affects the bone marrow, to stimulate blood cell production for removal of WBCs from the body prior to a bone marrow transplantation, to reduce the incidence of fever and infection in patients who have severe chronic neutropenia, and to improve survival outcomes following high-dose radiation exposure that is toxic to the bone marrow. This medicine may be used for other purposes; ask your health care provider or pharmacist if you have questions. COMMON BRAND NAME(S): Neupogen, Nivestym, Releuko, Zarxio What should I tell my care team before I take this medication? They need to know if you have any of these conditions: kidney disease latex allergy ongoing radiation therapy sickle cell disease an unusual or allergic reaction to filgrastim, pegfilgrastim, other medicines, foods, dyes, or preservatives pregnant or trying to get pregnant breast-feeding How should I use this medication? This medicine is for injection under the skin or infusion into a vein. As an infusion into a vein, it is usually given by a health care professional in a hospital or clinic setting. If you get this medicine at home, you will be taught how to prepare and give this medicine. Refer to the Instructions for Use that come with your medication packaging. Use exactly as directed. Take your medicine at regular intervals. Do not take your medicine more often than directed. It is important that you put your used needles and syringes in a special sharps container. Do not put them in a trash can. If you do not have a sharps container, call your pharmacist  or healthcare provider to get one. Talk to your pediatrician regarding the use of this medicine in children. While this drug may be prescribed for children as young as 7 months for selected conditions, precautions do apply. Overdosage: If you think you have taken too much of this medicine contact a poison control center or emergency room at once. NOTE: This medicine is only for you. Do not share this medicine with others. What if I miss a dose? It is important not to miss your dose. Call your doctor or health care professional if you miss a dose. What may interact with this medication? This medicine may interact with the following medications: medicines that may cause a release of neutrophils, such as lithium This list may not describe all possible interactions. Give your health care provider a list of all the medicines, herbs, non-prescription drugs, or dietary supplements you use. Also tell them if you smoke, drink alcohol, or use illegal drugs. Some items may interact with your medicine. What should I watch for while using this medication? Your condition will be monitored carefully while you are receiving this medicine. You may need blood work done while you are taking this medicine. Talk to your health care provider about your risk of cancer. You may be more at risk for certain types of cancer if you take this medicine. What side effects may I notice from receiving this medication? Side effects that you should report to your doctor or health care professional as soon as possible: allergic reactions like skin rash, itching or hives, swelling of the face, lips, or tongue back pain dizziness or feeling faint fever pain, redness, or   irritation at site where injected pinpoint red spots on the skin shortness of breath or breathing problems signs and symptoms of kidney injury like trouble passing urine, change in the amount of urine, or red or dark-brown urine stomach or side pain, or pain at  the shoulder swelling tiredness unusual bleeding or bruising Side effects that usually do not require medical attention (report to your doctor or health care professional if they continue or are bothersome): bone pain cough diarrhea hair loss headache muscle pain This list may not describe all possible side effects. Call your doctor for medical advice about side effects. You may report side effects to FDA at 1-800-FDA-1088. Where should I keep my medication? Keep out of the reach of children. Store in a refrigerator between 2 and 8 degrees C (36 and 46 degrees F). Do not freeze. Keep in carton to protect from light. Throw away this medicine if vials or syringes are left out of the refrigerator for more than 24 hours. Throw away any unused medicine after the expiration date. NOTE: This sheet is a summary. It may not cover all possible information. If you have questions about this medicine, talk to your doctor, pharmacist, or health care provider.  2022 Elsevier/Gold Standard (2019-01-17 18:47:55)  

## 2020-08-01 ENCOUNTER — Inpatient Hospital Stay: Payer: 59

## 2020-08-01 VITALS — BP 102/82 | HR 83 | Temp 97.6°F

## 2020-08-01 DIAGNOSIS — D702 Other drug-induced agranulocytosis: Secondary | ICD-10-CM | POA: Diagnosis not present

## 2020-08-01 DIAGNOSIS — Z5111 Encounter for antineoplastic chemotherapy: Secondary | ICD-10-CM | POA: Diagnosis not present

## 2020-08-01 DIAGNOSIS — Z5112 Encounter for antineoplastic immunotherapy: Secondary | ICD-10-CM | POA: Diagnosis not present

## 2020-08-01 DIAGNOSIS — Z95828 Presence of other vascular implants and grafts: Secondary | ICD-10-CM

## 2020-08-01 DIAGNOSIS — C50411 Malignant neoplasm of upper-outer quadrant of right female breast: Secondary | ICD-10-CM

## 2020-08-01 DIAGNOSIS — L659 Nonscarring hair loss, unspecified: Secondary | ICD-10-CM | POA: Diagnosis not present

## 2020-08-01 DIAGNOSIS — Z170 Estrogen receptor positive status [ER+]: Secondary | ICD-10-CM | POA: Diagnosis not present

## 2020-08-01 DIAGNOSIS — D6481 Anemia due to antineoplastic chemotherapy: Secondary | ICD-10-CM | POA: Diagnosis not present

## 2020-08-01 DIAGNOSIS — R5383 Other fatigue: Secondary | ICD-10-CM | POA: Diagnosis not present

## 2020-08-01 DIAGNOSIS — D6959 Other secondary thrombocytopenia: Secondary | ICD-10-CM | POA: Diagnosis not present

## 2020-08-01 MED ORDER — FILGRASTIM-SNDZ 300 MCG/0.5ML IJ SOSY
300.0000 ug | PREFILLED_SYRINGE | Freq: Once | INTRAMUSCULAR | Status: AC
  Administered 2020-08-01: 300 ug via SUBCUTANEOUS

## 2020-08-01 NOTE — Patient Instructions (Signed)
Filgrastim, G-CSF injection What is this medication? FILGRASTIM, G-CSF (fil GRA stim) is a granulocyte colony-stimulating factor that stimulates the growth of neutrophils, a type of white blood cell (WBC) important in the body's fight against infection. It is used to reduce the incidence of fever and infection in patients with certain types of cancer who are receiving chemotherapy that affects the bone marrow, to stimulate blood cell production for removal of WBCs from the body prior to a bone marrow transplantation, to reduce the incidence of fever and infection in patients who have severe chronic neutropenia, and to improve survival outcomes following high-dose radiation exposure that is toxic to the bone marrow. This medicine may be used for other purposes; ask your health care provider or pharmacist if you have questions. COMMON BRAND NAME(S): Neupogen, Nivestym, Releuko, Zarxio What should I tell my care team before I take this medication? They need to know if you have any of these conditions: kidney disease latex allergy ongoing radiation therapy sickle cell disease an unusual or allergic reaction to filgrastim, pegfilgrastim, other medicines, foods, dyes, or preservatives pregnant or trying to get pregnant breast-feeding How should I use this medication? This medicine is for injection under the skin or infusion into a vein. As an infusion into a vein, it is usually given by a health care professional in a hospital or clinic setting. If you get this medicine at home, you will be taught how to prepare and give this medicine. Refer to the Instructions for Use that come with your medication packaging. Use exactly as directed. Take your medicine at regular intervals. Do not take your medicine more often than directed. It is important that you put your used needles and syringes in a special sharps container. Do not put them in a trash can. If you do not have a sharps container, call your pharmacist  or healthcare provider to get one. Talk to your pediatrician regarding the use of this medicine in children. While this drug may be prescribed for children as young as 7 months for selected conditions, precautions do apply. Overdosage: If you think you have taken too much of this medicine contact a poison control center or emergency room at once. NOTE: This medicine is only for you. Do not share this medicine with others. What if I miss a dose? It is important not to miss your dose. Call your doctor or health care professional if you miss a dose. What may interact with this medication? This medicine may interact with the following medications: medicines that may cause a release of neutrophils, such as lithium This list may not describe all possible interactions. Give your health care provider a list of all the medicines, herbs, non-prescription drugs, or dietary supplements you use. Also tell them if you smoke, drink alcohol, or use illegal drugs. Some items may interact with your medicine. What should I watch for while using this medication? Your condition will be monitored carefully while you are receiving this medicine. You may need blood work done while you are taking this medicine. Talk to your health care provider about your risk of cancer. You may be more at risk for certain types of cancer if you take this medicine. What side effects may I notice from receiving this medication? Side effects that you should report to your doctor or health care professional as soon as possible: allergic reactions like skin rash, itching or hives, swelling of the face, lips, or tongue back pain dizziness or feeling faint fever pain, redness, or   irritation at site where injected pinpoint red spots on the skin shortness of breath or breathing problems signs and symptoms of kidney injury like trouble passing urine, change in the amount of urine, or red or dark-brown urine stomach or side pain, or pain at  the shoulder swelling tiredness unusual bleeding or bruising Side effects that usually do not require medical attention (report to your doctor or health care professional if they continue or are bothersome): bone pain cough diarrhea hair loss headache muscle pain This list may not describe all possible side effects. Call your doctor for medical advice about side effects. You may report side effects to FDA at 1-800-FDA-1088. Where should I keep my medication? Keep out of the reach of children. Store in a refrigerator between 2 and 8 degrees C (36 and 46 degrees F). Do not freeze. Keep in carton to protect from light. Throw away this medicine if vials or syringes are left out of the refrigerator for more than 24 hours. Throw away any unused medicine after the expiration date. NOTE: This sheet is a summary. It may not cover all possible information. If you have questions about this medicine, talk to your doctor, pharmacist, or health care provider.  2022 Elsevier/Gold Standard (2019-01-17 18:47:55)  

## 2020-08-04 ENCOUNTER — Other Ambulatory Visit: Payer: Self-pay

## 2020-08-04 ENCOUNTER — Inpatient Hospital Stay: Payer: 59

## 2020-08-04 ENCOUNTER — Telehealth: Payer: Self-pay | Admitting: *Deleted

## 2020-08-04 VITALS — BP 107/67 | HR 83 | Temp 98.1°F | Resp 16 | Wt 120.8 lb

## 2020-08-04 DIAGNOSIS — Z5111 Encounter for antineoplastic chemotherapy: Secondary | ICD-10-CM | POA: Diagnosis not present

## 2020-08-04 DIAGNOSIS — C50411 Malignant neoplasm of upper-outer quadrant of right female breast: Secondary | ICD-10-CM

## 2020-08-04 DIAGNOSIS — Z95828 Presence of other vascular implants and grafts: Secondary | ICD-10-CM

## 2020-08-04 DIAGNOSIS — Z170 Estrogen receptor positive status [ER+]: Secondary | ICD-10-CM

## 2020-08-04 DIAGNOSIS — D6959 Other secondary thrombocytopenia: Secondary | ICD-10-CM | POA: Diagnosis not present

## 2020-08-04 DIAGNOSIS — L659 Nonscarring hair loss, unspecified: Secondary | ICD-10-CM | POA: Diagnosis not present

## 2020-08-04 DIAGNOSIS — Z5112 Encounter for antineoplastic immunotherapy: Secondary | ICD-10-CM | POA: Diagnosis not present

## 2020-08-04 DIAGNOSIS — R5383 Other fatigue: Secondary | ICD-10-CM | POA: Diagnosis not present

## 2020-08-04 DIAGNOSIS — D6481 Anemia due to antineoplastic chemotherapy: Secondary | ICD-10-CM | POA: Diagnosis not present

## 2020-08-04 DIAGNOSIS — D702 Other drug-induced agranulocytosis: Secondary | ICD-10-CM | POA: Diagnosis not present

## 2020-08-04 LAB — CBC WITH DIFFERENTIAL (CANCER CENTER ONLY)
Abs Immature Granulocytes: 0.01 10*3/uL (ref 0.00–0.07)
Basophils Absolute: 0 10*3/uL (ref 0.0–0.1)
Basophils Relative: 1 %
Eosinophils Absolute: 0 10*3/uL (ref 0.0–0.5)
Eosinophils Relative: 0 %
HCT: 29.1 % — ABNORMAL LOW (ref 36.0–46.0)
Hemoglobin: 10.1 g/dL — ABNORMAL LOW (ref 12.0–15.0)
Immature Granulocytes: 0 %
Lymphocytes Relative: 29 %
Lymphs Abs: 1.1 10*3/uL (ref 0.7–4.0)
MCH: 34.1 pg — ABNORMAL HIGH (ref 26.0–34.0)
MCHC: 34.7 g/dL (ref 30.0–36.0)
MCV: 98.3 fL (ref 80.0–100.0)
Monocytes Absolute: 0.4 10*3/uL (ref 0.1–1.0)
Monocytes Relative: 11 %
Neutro Abs: 2.2 10*3/uL (ref 1.7–7.7)
Neutrophils Relative %: 59 %
Platelet Count: 185 10*3/uL (ref 150–400)
RBC: 2.96 MIL/uL — ABNORMAL LOW (ref 3.87–5.11)
RDW: 16 % — ABNORMAL HIGH (ref 11.5–15.5)
WBC Count: 3.7 10*3/uL — ABNORMAL LOW (ref 4.0–10.5)
nRBC: 0 % (ref 0.0–0.2)

## 2020-08-04 LAB — CMP (CANCER CENTER ONLY)
ALT: 24 U/L (ref 0–44)
AST: 27 U/L (ref 15–41)
Albumin: 4.1 g/dL (ref 3.5–5.0)
Alkaline Phosphatase: 81 U/L (ref 38–126)
Anion gap: 10 (ref 5–15)
BUN: 10 mg/dL (ref 6–20)
CO2: 26 mmol/L (ref 22–32)
Calcium: 9.4 mg/dL (ref 8.9–10.3)
Chloride: 101 mmol/L (ref 98–111)
Creatinine: 0.7 mg/dL (ref 0.44–1.00)
GFR, Estimated: >60 mL/min (ref >60)
Glucose, Bld: 91 mg/dL (ref 70–99)
Potassium: 3.9 mmol/L (ref 3.5–5.1)
Sodium: 137 mmol/L (ref 135–145)
Total Bilirubin: 0.5 mg/dL (ref 0.3–1.2)
Total Protein: 7.4 g/dL (ref 6.5–8.1)

## 2020-08-04 LAB — TSH: TSH: 0.505 u[IU]/mL (ref 0.308–3.960)

## 2020-08-04 MED ORDER — SODIUM CHLORIDE 0.9% FLUSH
10.0000 mL | INTRAVENOUS | Status: DC | PRN
  Administered 2020-08-04: 10 mL
  Filled 2020-08-04: qty 10

## 2020-08-04 MED ORDER — SODIUM CHLORIDE 0.9 % IV SOLN
10.0000 mg | Freq: Once | INTRAVENOUS | Status: AC
  Administered 2020-08-04: 10 mg via INTRAVENOUS
  Filled 2020-08-04: qty 10

## 2020-08-04 MED ORDER — SODIUM CHLORIDE 0.9 % IV SOLN
65.0000 mg/m2 | Freq: Once | INTRAVENOUS | Status: AC
  Administered 2020-08-04: 102 mg via INTRAVENOUS
  Filled 2020-08-04: qty 17

## 2020-08-04 MED ORDER — FAMOTIDINE 20 MG IN NS 100 ML IVPB
INTRAVENOUS | Status: AC
  Filled 2020-08-04: qty 100

## 2020-08-04 MED ORDER — FAMOTIDINE 20 MG IN NS 100 ML IVPB
20.0000 mg | Freq: Once | INTRAVENOUS | Status: AC
  Administered 2020-08-04: 20 mg via INTRAVENOUS

## 2020-08-04 MED ORDER — SODIUM CHLORIDE 0.9 % IV SOLN
200.0000 mg | Freq: Once | INTRAVENOUS | Status: AC
  Administered 2020-08-04: 200 mg via INTRAVENOUS
  Filled 2020-08-04: qty 8

## 2020-08-04 MED ORDER — SODIUM CHLORIDE 0.9% FLUSH
10.0000 mL | Freq: Once | INTRAVENOUS | Status: AC
Start: 2020-08-04 — End: 2020-08-04
  Administered 2020-08-04: 10 mL
  Filled 2020-08-04: qty 10

## 2020-08-04 MED ORDER — SODIUM CHLORIDE 0.9 % IV SOLN
Freq: Once | INTRAVENOUS | Status: AC
  Filled 2020-08-04: qty 250

## 2020-08-04 MED ORDER — DIPHENHYDRAMINE HCL 50 MG/ML IJ SOLN
25.0000 mg | Freq: Once | INTRAMUSCULAR | Status: AC
  Administered 2020-08-04: 25 mg via INTRAVENOUS

## 2020-08-04 MED ORDER — PALONOSETRON HCL INJECTION 0.25 MG/5ML
0.2500 mg | Freq: Once | INTRAVENOUS | Status: AC
  Administered 2020-08-04: 0.25 mg via INTRAVENOUS

## 2020-08-04 MED ORDER — SODIUM CHLORIDE 0.9 % IV SOLN
500.0000 mg | Freq: Once | INTRAVENOUS | Status: AC
  Administered 2020-08-04: 500 mg via INTRAVENOUS
  Filled 2020-08-04: qty 50

## 2020-08-04 MED ORDER — DIPHENHYDRAMINE HCL 50 MG/ML IJ SOLN
INTRAMUSCULAR | Status: AC
  Filled 2020-08-04: qty 1

## 2020-08-04 MED ORDER — PALONOSETRON HCL INJECTION 0.25 MG/5ML
INTRAVENOUS | Status: AC
  Filled 2020-08-04: qty 5

## 2020-08-04 MED ORDER — SODIUM CHLORIDE 0.9 % IV SOLN
150.0000 mg | Freq: Once | INTRAVENOUS | Status: AC
  Administered 2020-08-04: 150 mg via INTRAVENOUS
  Filled 2020-08-04: qty 150

## 2020-08-04 MED ORDER — HEPARIN SOD (PORK) LOCK FLUSH 100 UNIT/ML IV SOLN
500.0000 [IU] | Freq: Once | INTRAVENOUS | Status: AC | PRN
  Administered 2020-08-04: 500 [IU]
  Filled 2020-08-04: qty 5

## 2020-08-04 NOTE — Telephone Encounter (Signed)
Confirmed appt with Dr. Donne Hazel on 8/15 arrive at 2:20.

## 2020-08-04 NOTE — Patient Instructions (Signed)
[facility]  Discharge Instructions: Thank you for choosing [facility] to provide your oncology and hematology care.   If you have a lab appointment with the Cancer Center, please go directly to the Cancer Center and check in at the registration area.   Wear comfortable clothing and clothing appropriate for easy access to any Portacath or PICC line.   We strive to give you quality time with your provider. You may need to reschedule your appointment if you arrive late (15 or more minutes).  Arriving late affects you and other patients whose appointments are after yours.  Also, if you miss three or more appointments without notifying the office, you may be dismissed from the clinic at the provider's discretion.      For prescription refill requests, have your pharmacy contact our office and allow 72 hours for refills to be completed.    Today you received the following chemotherapy and/or immunotherapy agents: Keytruda, Taxol, Carboplatin     To help prevent nausea and vomiting after your treatment, we encourage you to take your nausea medication as directed.  BELOW ARE SYMPTOMS THAT SHOULD BE REPORTED IMMEDIATELY: . *FEVER GREATER THAN 100.4 F (38 C) OR HIGHER . *CHILLS OR SWEATING . *NAUSEA AND VOMITING THAT IS NOT CONTROLLED WITH YOUR NAUSEA MEDICATION . *UNUSUAL SHORTNESS OF BREATH . *UNUSUAL BRUISING OR BLEEDING . *URINARY PROBLEMS (pain or burning when urinating, or frequent urination) . *BOWEL PROBLEMS (unusual diarrhea, constipation, pain near the anus) . TENDERNESS IN MOUTH AND THROAT WITH OR WITHOUT PRESENCE OF ULCERS (sore throat, sores in mouth, or a toothache) . UNUSUAL RASH, SWELLING OR PAIN  . UNUSUAL VAGINAL DISCHARGE OR ITCHING   Items with * indicate a potential emergency and should be followed up as soon as possible or go to the Emergency Department if any problems should occur.  Please show the CHEMOTHERAPY ALERT CARD or  IMMUNOTHERAPY ALERT CARD at check-in to the Emergency Department and triage nurse.  Should you have questions after your visit or need to cancel or reschedule your appointment, please contact [facility]  Dept: [phone]  and follow the prompts.  Office hours are 8:00 a.m. to 4:30 p.m. Monday - Friday. Please note that voicemails left after 4:00 p.m. may not be returned until the following business day.  We are closed weekends and major holidays. You have access to a nurse at all times for urgent questions. Please call the main number to the clinic Dept: [phone] and follow the prompts.   For any non-urgent questions, you may also contact your provider using MyChart. We now offer e-Visits for anyone 18 and older to request care online for non-urgent symptoms. For details visit [email or web address].   Also download the MyChart app! Go to the app store, search "MyChart", open the app, select [facility], and log in with your MyChart username and password.  Due to Covid, a mask is required upon entering the hospital/clinic. If you do not have a mask, one will be given to you upon arrival. For doctor visits, patients may have 1 support person aged 18 or older with them. For treatment visits, patients cannot have anyone with them due to current Covid guidelines and our immunocompromised population.   

## 2020-08-05 DIAGNOSIS — Z170 Estrogen receptor positive status [ER+]: Secondary | ICD-10-CM | POA: Diagnosis not present

## 2020-08-05 DIAGNOSIS — Z1509 Genetic susceptibility to other malignant neoplasm: Secondary | ICD-10-CM | POA: Diagnosis not present

## 2020-08-05 DIAGNOSIS — Z1501 Genetic susceptibility to malignant neoplasm of breast: Secondary | ICD-10-CM | POA: Diagnosis not present

## 2020-08-05 DIAGNOSIS — C50411 Malignant neoplasm of upper-outer quadrant of right female breast: Secondary | ICD-10-CM | POA: Diagnosis not present

## 2020-08-07 ENCOUNTER — Inpatient Hospital Stay: Payer: 59

## 2020-08-07 ENCOUNTER — Other Ambulatory Visit: Payer: Self-pay

## 2020-08-07 VITALS — BP 102/73 | HR 82 | Temp 98.6°F | Resp 16

## 2020-08-07 DIAGNOSIS — Z95828 Presence of other vascular implants and grafts: Secondary | ICD-10-CM

## 2020-08-07 DIAGNOSIS — D6481 Anemia due to antineoplastic chemotherapy: Secondary | ICD-10-CM | POA: Diagnosis not present

## 2020-08-07 DIAGNOSIS — Z170 Estrogen receptor positive status [ER+]: Secondary | ICD-10-CM | POA: Diagnosis not present

## 2020-08-07 DIAGNOSIS — Z5112 Encounter for antineoplastic immunotherapy: Secondary | ICD-10-CM | POA: Diagnosis not present

## 2020-08-07 DIAGNOSIS — L659 Nonscarring hair loss, unspecified: Secondary | ICD-10-CM | POA: Diagnosis not present

## 2020-08-07 DIAGNOSIS — Z5111 Encounter for antineoplastic chemotherapy: Secondary | ICD-10-CM | POA: Diagnosis not present

## 2020-08-07 DIAGNOSIS — D702 Other drug-induced agranulocytosis: Secondary | ICD-10-CM | POA: Diagnosis not present

## 2020-08-07 DIAGNOSIS — C50411 Malignant neoplasm of upper-outer quadrant of right female breast: Secondary | ICD-10-CM

## 2020-08-07 DIAGNOSIS — R5383 Other fatigue: Secondary | ICD-10-CM | POA: Diagnosis not present

## 2020-08-07 DIAGNOSIS — D6959 Other secondary thrombocytopenia: Secondary | ICD-10-CM | POA: Diagnosis not present

## 2020-08-07 MED ORDER — FILGRASTIM-SNDZ 300 MCG/0.5ML IJ SOSY
PREFILLED_SYRINGE | INTRAMUSCULAR | Status: AC
  Filled 2020-08-07: qty 0.5

## 2020-08-07 MED ORDER — FILGRASTIM-SNDZ 300 MCG/0.5ML IJ SOSY
300.0000 ug | PREFILLED_SYRINGE | Freq: Once | INTRAMUSCULAR | Status: AC
  Administered 2020-08-07: 300 ug via SUBCUTANEOUS

## 2020-08-07 NOTE — Patient Instructions (Signed)
Filgrastim, G-CSF injection What is this medication? FILGRASTIM, G-CSF (fil GRA stim) is a granulocyte colony-stimulating factor that stimulates the growth of neutrophils, a type of white blood cell (WBC) important in the body's fight against infection. It is used to reduce the incidence of fever and infection in patients with certain types of cancer who are receiving chemotherapy that affects the bone marrow, to stimulate blood cell production for removal of WBCs from the body prior to a bone marrow transplantation, to reduce the incidence of fever and infection in patients who have severe chronic neutropenia, and to improve survival outcomes following high-dose radiation exposure that is toxic to the bone marrow. This medicine may be used for other purposes; ask your health care provider or pharmacist if you have questions. COMMON BRAND NAME(S): Neupogen, Nivestym, Releuko, Zarxio What should I tell my care team before I take this medication? They need to know if you have any of these conditions: kidney disease latex allergy ongoing radiation therapy sickle cell disease an unusual or allergic reaction to filgrastim, pegfilgrastim, other medicines, foods, dyes, or preservatives pregnant or trying to get pregnant breast-feeding How should I use this medication? This medicine is for injection under the skin or infusion into a vein. As an infusion into a vein, it is usually given by a health care professional in a hospital or clinic setting. If you get this medicine at home, you will be taught how to prepare and give this medicine. Refer to the Instructions for Use that come with your medication packaging. Use exactly as directed. Take your medicine at regular intervals. Do not take your medicine more often than directed. It is important that you put your used needles and syringes in a special sharps container. Do not put them in a trash can. If you do not have a sharps container, call your pharmacist  or healthcare provider to get one. Talk to your pediatrician regarding the use of this medicine in children. While this drug may be prescribed for children as young as 7 months for selected conditions, precautions do apply. Overdosage: If you think you have taken too much of this medicine contact a poison control center or emergency room at once. NOTE: This medicine is only for you. Do not share this medicine with others. What if I miss a dose? It is important not to miss your dose. Call your doctor or health care professional if you miss a dose. What may interact with this medication? This medicine may interact with the following medications: medicines that may cause a release of neutrophils, such as lithium This list may not describe all possible interactions. Give your health care provider a list of all the medicines, herbs, non-prescription drugs, or dietary supplements you use. Also tell them if you smoke, drink alcohol, or use illegal drugs. Some items may interact with your medicine. What should I watch for while using this medication? Your condition will be monitored carefully while you are receiving this medicine. You may need blood work done while you are taking this medicine. Talk to your health care provider about your risk of cancer. You may be more at risk for certain types of cancer if you take this medicine. What side effects may I notice from receiving this medication? Side effects that you should report to your doctor or health care professional as soon as possible: allergic reactions like skin rash, itching or hives, swelling of the face, lips, or tongue back pain dizziness or feeling faint fever pain, redness, or   irritation at site where injected pinpoint red spots on the skin shortness of breath or breathing problems signs and symptoms of kidney injury like trouble passing urine, change in the amount of urine, or red or dark-brown urine stomach or side pain, or pain at  the shoulder swelling tiredness unusual bleeding or bruising Side effects that usually do not require medical attention (report to your doctor or health care professional if they continue or are bothersome): bone pain cough diarrhea hair loss headache muscle pain This list may not describe all possible side effects. Call your doctor for medical advice about side effects. You may report side effects to FDA at 1-800-FDA-1088. Where should I keep my medication? Keep out of the reach of children. Store in a refrigerator between 2 and 8 degrees C (36 and 46 degrees F). Do not freeze. Keep in carton to protect from light. Throw away this medicine if vials or syringes are left out of the refrigerator for more than 24 hours. Throw away any unused medicine after the expiration date. NOTE: This sheet is a summary. It may not cover all possible information. If you have questions about this medicine, talk to your doctor, pharmacist, or health care provider.  2022 Elsevier/Gold Standard (2019-01-17 18:47:55)  

## 2020-08-08 ENCOUNTER — Other Ambulatory Visit: Payer: Self-pay

## 2020-08-08 ENCOUNTER — Inpatient Hospital Stay: Payer: 59

## 2020-08-08 VITALS — BP 104/66 | HR 75 | Temp 98.2°F | Resp 18

## 2020-08-08 DIAGNOSIS — Z5112 Encounter for antineoplastic immunotherapy: Secondary | ICD-10-CM | POA: Diagnosis not present

## 2020-08-08 DIAGNOSIS — D6481 Anemia due to antineoplastic chemotherapy: Secondary | ICD-10-CM | POA: Diagnosis not present

## 2020-08-08 DIAGNOSIS — Z5111 Encounter for antineoplastic chemotherapy: Secondary | ICD-10-CM | POA: Diagnosis not present

## 2020-08-08 DIAGNOSIS — C50411 Malignant neoplasm of upper-outer quadrant of right female breast: Secondary | ICD-10-CM | POA: Diagnosis not present

## 2020-08-08 DIAGNOSIS — D702 Other drug-induced agranulocytosis: Secondary | ICD-10-CM | POA: Diagnosis not present

## 2020-08-08 DIAGNOSIS — D6959 Other secondary thrombocytopenia: Secondary | ICD-10-CM | POA: Diagnosis not present

## 2020-08-08 DIAGNOSIS — Z170 Estrogen receptor positive status [ER+]: Secondary | ICD-10-CM | POA: Diagnosis not present

## 2020-08-08 DIAGNOSIS — R5383 Other fatigue: Secondary | ICD-10-CM | POA: Diagnosis not present

## 2020-08-08 DIAGNOSIS — L659 Nonscarring hair loss, unspecified: Secondary | ICD-10-CM | POA: Diagnosis not present

## 2020-08-08 DIAGNOSIS — Z95828 Presence of other vascular implants and grafts: Secondary | ICD-10-CM

## 2020-08-08 MED ORDER — FILGRASTIM-SNDZ 300 MCG/0.5ML IJ SOSY
PREFILLED_SYRINGE | INTRAMUSCULAR | Status: AC
  Filled 2020-08-08: qty 1

## 2020-08-08 MED ORDER — FILGRASTIM-SNDZ 300 MCG/0.5ML IJ SOSY
300.0000 ug | PREFILLED_SYRINGE | Freq: Once | INTRAMUSCULAR | Status: AC
Start: 2020-08-08 — End: 2020-08-08
  Administered 2020-08-08: 300 ug via SUBCUTANEOUS

## 2020-08-08 MED ORDER — PEGFILGRASTIM-CBQV 6 MG/0.6ML ~~LOC~~ SOSY
PREFILLED_SYRINGE | SUBCUTANEOUS | Status: AC
  Filled 2020-08-08: qty 2.4

## 2020-08-08 MED ORDER — FILGRASTIM-SNDZ 480 MCG/0.8ML IJ SOSY
PREFILLED_SYRINGE | INTRAMUSCULAR | Status: AC
  Filled 2020-08-08: qty 0.8

## 2020-08-08 NOTE — Patient Instructions (Signed)
Filgrastim, G-CSF injection What is this medication? FILGRASTIM, G-CSF (fil GRA stim) is a granulocyte colony-stimulating factor that stimulates the growth of neutrophils, a type of white blood cell (WBC) important in the body's fight against infection. It is used to reduce the incidence of fever and infection in patients with certain types of cancer who are receiving chemotherapy that affects the bone marrow, to stimulate blood cell production for removal of WBCs from the body prior to a bone marrow transplantation, to reduce the incidence of fever and infection in patients who have severe chronic neutropenia, and to improve survival outcomes following high-dose radiation exposure that is toxic to the bone marrow. This medicine may be used for other purposes; ask your health care provider or pharmacist if you have questions. COMMON BRAND NAME(S): Neupogen, Nivestym, Releuko, Zarxio What should I tell my care team before I take this medication? They need to know if you have any of these conditions: kidney disease latex allergy ongoing radiation therapy sickle cell disease an unusual or allergic reaction to filgrastim, pegfilgrastim, other medicines, foods, dyes, or preservatives pregnant or trying to get pregnant breast-feeding How should I use this medication? This medicine is for injection under the skin or infusion into a vein. As an infusion into a vein, it is usually given by a health care professional in a hospital or clinic setting. If you get this medicine at home, you will be taught how to prepare and give this medicine. Refer to the Instructions for Use that come with your medication packaging. Use exactly as directed. Take your medicine at regular intervals. Do not take your medicine more often than directed. It is important that you put your used needles and syringes in a special sharps container. Do not put them in a trash can. If you do not have a sharps container, call your pharmacist  or healthcare provider to get one. Talk to your pediatrician regarding the use of this medicine in children. While this drug may be prescribed for children as young as 7 months for selected conditions, precautions do apply. Overdosage: If you think you have taken too much of this medicine contact a poison control center or emergency room at once. NOTE: This medicine is only for you. Do not share this medicine with others. What if I miss a dose? It is important not to miss your dose. Call your doctor or health care professional if you miss a dose. What may interact with this medication? This medicine may interact with the following medications: medicines that may cause a release of neutrophils, such as lithium This list may not describe all possible interactions. Give your health care provider a list of all the medicines, herbs, non-prescription drugs, or dietary supplements you use. Also tell them if you smoke, drink alcohol, or use illegal drugs. Some items may interact with your medicine. What should I watch for while using this medication? Your condition will be monitored carefully while you are receiving this medicine. You may need blood work done while you are taking this medicine. Talk to your health care provider about your risk of cancer. You may be more at risk for certain types of cancer if you take this medicine. What side effects may I notice from receiving this medication? Side effects that you should report to your doctor or health care professional as soon as possible: allergic reactions like skin rash, itching or hives, swelling of the face, lips, or tongue back pain dizziness or feeling faint fever pain, redness, or   irritation at site where injected pinpoint red spots on the skin shortness of breath or breathing problems signs and symptoms of kidney injury like trouble passing urine, change in the amount of urine, or red or dark-brown urine stomach or side pain, or pain at  the shoulder swelling tiredness unusual bleeding or bruising Side effects that usually do not require medical attention (report to your doctor or health care professional if they continue or are bothersome): bone pain cough diarrhea hair loss headache muscle pain This list may not describe all possible side effects. Call your doctor for medical advice about side effects. You may report side effects to FDA at 1-800-FDA-1088. Where should I keep my medication? Keep out of the reach of children. Store in a refrigerator between 2 and 8 degrees C (36 and 46 degrees F). Do not freeze. Keep in carton to protect from light. Throw away this medicine if vials or syringes are left out of the refrigerator for more than 24 hours. Throw away any unused medicine after the expiration date. NOTE: This sheet is a summary. It may not cover all possible information. If you have questions about this medicine, talk to your doctor, pharmacist, or health care provider.  2022 Elsevier/Gold Standard (2019-01-17 18:47:55)  

## 2020-08-10 NOTE — Assessment & Plan Note (Signed)
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  Treatment plan: 1. Neo- adjuvant chemotherapy with dose dense Adriamycin and Cytoxan/Keytrudafollowed by Taxol and carboplatin 2.bilateral mastectomieswith sentinel lymph node biopsy 3. Adjuvant radiation 4. followed by adjuvant antiestrogen therapy 5. Genetic testing _______________________________________________________________________ Current Treatment:Completed 4 cycles of neoadjuvant chemotherapy with Adriamycin/Cytoxan/Keytruda,today cycle9Taxol (carboplatin and Keytruda every 3 weeks)  Chemo toxicities: 1.Alopecia 2.fatigue: Stable 3.Chemotherapy-induced anemia: Hemoglobin9.8 monitoring 4.Thrombocytopenia:Today's platelet count is125 5.Leukopenia/neutropenia:We are administering Granix(2injections)prior to each chemo.   I schedule her for breast MRI after the finish of chemotherapy and I will request Dr. Donne Hazel to see her after that. Denies any peripheral neuropathy. Monitoring closely for toxicities.

## 2020-08-10 NOTE — Progress Notes (Signed)
Patient Care Team: Emeterio Reeve, DO as PCP - General (Osteopathic Medicine) [name], MD as Consulting Physician (Obstetrics and Gynecology) Mauro Kaufmann, RN as Oncology Nurse Navigator Rockwell Germany, RN as Oncology Nurse Navigator  DIAGNOSIS:    ICD-10-CM   1. Malignant neoplasm of upper-outer quadrant of right breast in female, estrogen receptor positive ([place])  C50.411    Z17.0       SUMMARY OF ONCOLOGIC HISTORY: Oncology History  Malignant neoplasm of upper outer quadrant of female breast ([place])  01/28/2020 Initial Diagnosis   Patient palpated a right breast lump. Diagnostic mammogram and US showed calcifications spanning 2.0cm in the right breast. Biopsy showed invasive and in situ carcinoma, grade 2. ER 5% weak, PR 0% negative, HER2 equiv, Ki 10%     01/28/2020 Cancer Staging   Staging form: Breast, AJCC 8th Edition - Clinical stage from 01/28/2020: Stage IA (cT1c, cN0, cM0, G2, ER+, PR-, HER2: Equivocal) - Signed by Nicholas Lose, MD on 01/29/2020    02/05/2020 -  Chemotherapy    Patient is on Treatment Plan: BREAST DOSE DENSE AC Q14D / CARBOPLATIN D1 + PACLITAXEL D1,8,15 Q21D       02/13/2020 Genetic Testing   Positive genetic testing:  A single, heterozygous, pathogenic variant was detected in the BRCA2 gene called [id number]_[id number]. Testing was completed through the CustomNext-Cancer + RNAinsight panel offered by [facility]. 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 The report date is 02/13/2020.  The CustomNext-Cancer+RNAinsight panel offered by [facility] includes sequencing and rearrangement analysis for the following 47 genes:  APC, ATM, AXIN2, BARD1, BMPR1A, BRCA1, BRCA2, BRIP1, CDH1, CDK4, CDKN2A, CHEK2, DICER1, EPCAM, GREM1, HOXB13, MEN1, MLH1, MSH2, MSH3, MSH6, MUTYH, NBN, NF1, NF2, NTHL1, PALB2, PMS2, POLD1, POLE, PTEN, RAD51C, RAD51D, RECQL, RET, SDHA, SDHAF2, SDHB,  SDHC, SDHD, SMAD4, SMARCA4, STK11, TP53, TSC1, TSC2, and VHL.  RNA data is routinely analyzed for use in variant interpretation for all genes.     CHIEF COMPLIANT: Cycle 11 Taxol  INTERVAL HISTORY: Sydney Herman is a 40 y.o. with above-mentioned history of right cancer currently on neoadjuvant chemotherapy with Taxol, Keytruda, and Carboplatin after completing 4 cycles of Adriamycin/Cytoxan/Pembrolizumab. She presents to the clinic today for a toxicity check and day 11  ALLERGIES:  is allergic to amoxil [amoxicillin].  MEDICATIONS:  Current Outpatient Medications  Medication Sig Dispense Refill   cycloSPORINE (RESTASIS) 0.05 % ophthalmic emulsion INSILL 1 DROP INTO BOTH EYES TWICE DAILY 180 mL 4   doxycycline (VIBRA-TABS) 100 MG tablet TAKE 1 TABLET BY MOUTH 2 TIMES DAILY FOR 7 DAYS 14 tablet 0   escitalopram (LEXAPRO) 10 MG tablet Take 1 tablet (10 mg total) by mouth daily. 90 tablet 3   lidocaine-prilocaine (EMLA) cream APPLY TO AFFECTED AREA ONCE 30 g 3   loratadine (CLARITIN) 10 MG tablet 10 mg.     ondansetron (ZOFRAN) 8 MG tablet TAKE 1 TABLET BY MOUTH 2 TIMES DAILY AS NEEDED START ON THE THIRD DAY AFTER CHEMO 30 tablet 1   prochlorperazine (COMPAZINE) 10 MG tablet TAKE 1 TABLET BY MOUTH EVERY 6 HOURS AS NEEDED FOR NAUSEA OR VOMITING 30 tablet 1   RESTASIS 0.05 % ophthalmic emulsion Place 1 drop into the right eye 2 (two) times daily.     No current facility-administered medications for this visit.    PHYSICAL EXAMINATION: ECOG PERFORMANCE STATUS: 1 - Symptomatic but completely ambulatory  Vitals:   08/11/20 0912  BP:  105/66  Pulse: 79  Resp: 18  Temp: 98 F (36.7 C)  SpO2: 100%   Filed Weights   08/11/20 0912  Weight: 124 lb 3.2 oz (56.3 kg)    LABORATORY DATA:  I have reviewed the data as listed CMP Latest Ref Rng & Units 08/04/2020 07/29/2020 07/14/2020  Glucose 70 - 99 mg/dL 91 102(H) 98  BUN 6 - 20 mg/dL '10 6 8  ' Creatinine 0.44 - 1.00 mg/dL 0.70 0.58 0.62   Sodium 135 - 145 mmol/L 137 142 140  Potassium 3.5 - 5.1 mmol/L 3.9 3.9 4.0  Chloride 98 - 111 mmol/L 101 102 105  CO2 22 - 32 mmol/L '26 29 28  ' Calcium 8.9 - 10.3 mg/dL 9.4 9.7 9.3  Total Protein 6.5 - 8.1 g/dL 7.4 7.3 7.2  Total Bilirubin 0.3 - 1.2 mg/dL 0.5 0.5 <0.2(L)  Alkaline Phos 38 - 126 U/L 81 86 104  AST 15 - 41 U/L 27 31 32  ALT 0 - 44 U/L 24 33 39    Lab Results  Component Value Date   WBC 3.1 (L) 08/11/2020   HGB 10.0 (L) 08/11/2020   HCT 29.0 (L) 08/11/2020   MCV 98.0 08/11/2020   PLT 215 08/11/2020   NEUTROABS 1.6 (L) 08/11/2020    ASSESSMENT & PLAN:  Malignant neoplasm of upper outer quadrant of female breast ([place]) 01/28/2020: Palpable right breast lump: Mammogram and ultrasound revealed calcifications spanning 2 cm, biopsy revealed IDC with DCIS, grade 2, 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     Treatment plan: 1. Neo- adjuvant chemotherapy with dose dense Adriamycin and Cytoxan/Keytruda followed by Taxol and carboplatin 2. bilateral mastectomies with sentinel lymph node biopsy 3. Adjuvant radiation 4. followed by adjuvant antiestrogen therapy 5.  Genetic testing _______________________________________________________________________ Current Treatment: Completed 4 cycles of neoadjuvant chemotherapy with Adriamycin/Cytoxan/Keytruda, today cycle 11 Taxol (carboplatin and Keytruda every 3 weeks)   Chemo toxicities: 1.  Alopecia 2. fatigue: Stable 3.  Chemotherapy-induced anemia: Hemoglobin 10 monitoring 4.  Thrombocytopenia: Today's platelet count is 215 5.  Leukopenia/neutropenia: We are administering Granix (2 injections) prior to each chemo.    I schedule her for breast MRI after the finish of chemotherapy and I will request Dr. Donne Hazel to see her after that. Denies  any peripheral neuropathy. Monitoring closely for toxicities. We will set her up for Keytruda on 08/25/2020. Follow-up 1 week after surgery to discuss the final pathology report.    No orders of the defined types were placed in this encounter.  The patient has a good understanding of the overall plan. she agrees with it. she will call with any problems that may develop before the next visit here.  Total time spent: 30 mins including face to face time and time spent for planning, charting and coordination of care  Rulon Eisenmenger, MD, MPH 08/11/2020  I, Thana Ates, am acting as scribe for Dr. Nicholas Lose.  I have reviewed the above documentation for accuracy and completeness, and I agree with the above.

## 2020-08-11 ENCOUNTER — Inpatient Hospital Stay (HOSPITAL_BASED_OUTPATIENT_CLINIC_OR_DEPARTMENT_OTHER): Payer: 59 | Admitting: Hematology and Oncology

## 2020-08-11 ENCOUNTER — Other Ambulatory Visit: Payer: Self-pay

## 2020-08-11 ENCOUNTER — Inpatient Hospital Stay: Payer: 59 | Attending: Adult Health

## 2020-08-11 ENCOUNTER — Inpatient Hospital Stay: Payer: 59

## 2020-08-11 DIAGNOSIS — Z170 Estrogen receptor positive status [ER+]: Secondary | ICD-10-CM | POA: Diagnosis not present

## 2020-08-11 DIAGNOSIS — L658 Other specified nonscarring hair loss: Secondary | ICD-10-CM | POA: Insufficient documentation

## 2020-08-11 DIAGNOSIS — R5383 Other fatigue: Secondary | ICD-10-CM | POA: Insufficient documentation

## 2020-08-11 DIAGNOSIS — Z5111 Encounter for antineoplastic chemotherapy: Secondary | ICD-10-CM | POA: Diagnosis not present

## 2020-08-11 DIAGNOSIS — Z79899 Other long term (current) drug therapy: Secondary | ICD-10-CM | POA: Diagnosis not present

## 2020-08-11 DIAGNOSIS — C50411 Malignant neoplasm of upper-outer quadrant of right female breast: Secondary | ICD-10-CM

## 2020-08-11 DIAGNOSIS — T451X5A Adverse effect of antineoplastic and immunosuppressive drugs, initial encounter: Secondary | ICD-10-CM | POA: Insufficient documentation

## 2020-08-11 DIAGNOSIS — D702 Other drug-induced agranulocytosis: Secondary | ICD-10-CM | POA: Diagnosis not present

## 2020-08-11 DIAGNOSIS — D6481 Anemia due to antineoplastic chemotherapy: Secondary | ICD-10-CM | POA: Insufficient documentation

## 2020-08-11 DIAGNOSIS — Z5112 Encounter for antineoplastic immunotherapy: Secondary | ICD-10-CM | POA: Diagnosis not present

## 2020-08-11 DIAGNOSIS — D6959 Other secondary thrombocytopenia: Secondary | ICD-10-CM | POA: Insufficient documentation

## 2020-08-11 DIAGNOSIS — Z95828 Presence of other vascular implants and grafts: Secondary | ICD-10-CM

## 2020-08-11 LAB — CBC WITH DIFFERENTIAL (CANCER CENTER ONLY)
Abs Immature Granulocytes: 0.01 10*3/uL (ref 0.00–0.07)
Basophils Absolute: 0 10*3/uL (ref 0.0–0.1)
Basophils Relative: 1 %
Eosinophils Absolute: 0 10*3/uL (ref 0.0–0.5)
Eosinophils Relative: 1 %
HCT: 29 % — ABNORMAL LOW (ref 36.0–46.0)
Hemoglobin: 10 g/dL — ABNORMAL LOW (ref 12.0–15.0)
Immature Granulocytes: 0 %
Lymphocytes Relative: 34 %
Lymphs Abs: 1.1 10*3/uL (ref 0.7–4.0)
MCH: 33.8 pg (ref 26.0–34.0)
MCHC: 34.5 g/dL (ref 30.0–36.0)
MCV: 98 fL (ref 80.0–100.0)
Monocytes Absolute: 0.3 10*3/uL (ref 0.1–1.0)
Monocytes Relative: 11 %
Neutro Abs: 1.6 10*3/uL — ABNORMAL LOW (ref 1.7–7.7)
Neutrophils Relative %: 53 %
Platelet Count: 215 10*3/uL (ref 150–400)
RBC: 2.96 MIL/uL — ABNORMAL LOW (ref 3.87–5.11)
RDW: 15.8 % — ABNORMAL HIGH (ref 11.5–15.5)
WBC Count: 3.1 10*3/uL — ABNORMAL LOW (ref 4.0–10.5)
nRBC: 0 % (ref 0.0–0.2)

## 2020-08-11 LAB — CMP (CANCER CENTER ONLY)
ALT: 28 U/L (ref 0–44)
AST: 28 U/L (ref 15–41)
Albumin: 4.1 g/dL (ref 3.5–5.0)
Alkaline Phosphatase: 84 U/L (ref 38–126)
Anion gap: 7 (ref 5–15)
BUN: 9 mg/dL (ref 6–20)
CO2: 28 mmol/L (ref 22–32)
Calcium: 9.5 mg/dL (ref 8.9–10.3)
Chloride: 101 mmol/L (ref 98–111)
Creatinine: 0.64 mg/dL (ref 0.44–1.00)
GFR, Estimated: >60 mL/min (ref >60)
Glucose, Bld: 94 mg/dL (ref 70–99)
Potassium: 3.8 mmol/L (ref 3.5–5.1)
Sodium: 136 mmol/L (ref 135–145)
Total Bilirubin: 0.4 mg/dL (ref 0.3–1.2)
Total Protein: 7.2 g/dL (ref 6.5–8.1)

## 2020-08-11 LAB — TSH: TSH: 0.487 u[IU]/mL (ref 0.350–4.500)

## 2020-08-11 LAB — PREGNANCY, URINE: Preg Test, Ur: NEGATIVE

## 2020-08-11 MED ORDER — SODIUM CHLORIDE 0.9 % IV SOLN
10.0000 mg | Freq: Once | INTRAVENOUS | Status: AC
  Administered 2020-08-11: 10 mg via INTRAVENOUS
  Filled 2020-08-11: qty 10

## 2020-08-11 MED ORDER — SODIUM CHLORIDE 0.9 % IV SOLN
65.0000 mg/m2 | Freq: Once | INTRAVENOUS | Status: AC
  Administered 2020-08-11: 102 mg via INTRAVENOUS
  Filled 2020-08-11: qty 17

## 2020-08-11 MED ORDER — SODIUM CHLORIDE 0.9 % IV SOLN
Freq: Once | INTRAVENOUS | Status: AC
  Filled 2020-08-11: qty 250

## 2020-08-11 MED ORDER — ONDANSETRON HCL 4 MG/2ML IJ SOLN
INTRAMUSCULAR | Status: AC
  Filled 2020-08-11: qty 4

## 2020-08-11 MED ORDER — SODIUM CHLORIDE 0.9% FLUSH
10.0000 mL | INTRAVENOUS | Status: DC | PRN
  Administered 2020-08-11: 10 mL
  Filled 2020-08-11: qty 10

## 2020-08-11 MED ORDER — FAMOTIDINE 20 MG IN NS 100 ML IVPB
20.0000 mg | Freq: Once | INTRAVENOUS | Status: AC
  Administered 2020-08-11: 20 mg via INTRAVENOUS

## 2020-08-11 MED ORDER — SODIUM CHLORIDE 0.9% FLUSH
10.0000 mL | Freq: Once | INTRAVENOUS | Status: AC
  Administered 2020-08-11: 10 mL
  Filled 2020-08-11: qty 10

## 2020-08-11 MED ORDER — ONDANSETRON HCL 4 MG/2ML IJ SOLN
8.0000 mg | Freq: Once | INTRAMUSCULAR | Status: AC
  Administered 2020-08-11: 8 mg via INTRAVENOUS

## 2020-08-11 MED ORDER — FAMOTIDINE 20 MG IN NS 100 ML IVPB
INTRAVENOUS | Status: AC
  Filled 2020-08-11: qty 100

## 2020-08-11 MED ORDER — DIPHENHYDRAMINE HCL 50 MG/ML IJ SOLN
INTRAMUSCULAR | Status: AC
  Filled 2020-08-11: qty 1

## 2020-08-11 MED ORDER — DIPHENHYDRAMINE HCL 50 MG/ML IJ SOLN
25.0000 mg | Freq: Once | INTRAMUSCULAR | Status: AC
  Administered 2020-08-11: 25 mg via INTRAVENOUS

## 2020-08-11 MED ORDER — HEPARIN SOD (PORK) LOCK FLUSH 100 UNIT/ML IV SOLN
500.0000 [IU] | Freq: Once | INTRAVENOUS | Status: AC | PRN
  Administered 2020-08-11: 500 [IU]
  Filled 2020-08-11: qty 5

## 2020-08-11 NOTE — Patient Instructions (Signed)
[facility]  Discharge Instructions: Thank you for choosing [facility] to provide your oncology and hematology care.   If you have a lab appointment with the [facility], please go directly to the [facility] and check in at the registration area.   Wear comfortable clothing and clothing appropriate for easy access to any Portacath or PICC line.   We strive to give you quality time with your provider. You may need to reschedule your appointment if you arrive late (15 or more minutes).  Arriving late affects you and other patients whose appointments are after yours.  Also, if you miss three or more appointments without notifying the office, you may be dismissed from the clinic at the provider's discretion.      For prescription refill requests, have your pharmacy contact our office and allow 72 hours for refills to be completed.    Today you received the following chemotherapy and/or immunotherapy agents: Taxol      To help prevent nausea and vomiting after your treatment, we encourage you to take your nausea medication as directed.  BELOW ARE SYMPTOMS THAT SHOULD BE REPORTED IMMEDIATELY: *FEVER GREATER THAN 100.4 F (38 C) OR HIGHER *CHILLS OR SWEATING *NAUSEA AND VOMITING THAT IS NOT CONTROLLED WITH YOUR NAUSEA MEDICATION *UNUSUAL SHORTNESS OF BREATH *UNUSUAL BRUISING OR BLEEDING *URINARY PROBLEMS (pain or burning when urinating, or frequent urination) *BOWEL PROBLEMS (unusual diarrhea, constipation, pain near the anus) TENDERNESS IN MOUTH AND THROAT WITH OR WITHOUT PRESENCE OF ULCERS (sore throat, sores in mouth, or a toothache) UNUSUAL RASH, SWELLING OR PAIN  UNUSUAL VAGINAL DISCHARGE OR ITCHING   Items with * indicate a potential emergency and should be followed up as soon as possible or go to the Emergency Department if any problems should occur.  Please show the CHEMOTHERAPY ALERT CARD or IMMUNOTHERAPY ALERT CARD at check-in to the  Emergency Department and triage nurse.  Should you have questions after your visit or need to cancel or reschedule your appointment, please contact [facility]  Dept: [phone]  and follow the prompts.  Office hours are 8:00 a.m. to 4:30 p.m. Monday - Friday. Please note that voicemails left after 4:00 p.m. may not be returned until the following business day.  We are closed weekends and major holidays. You have access to a nurse at all times for urgent questions. Please call the main number to the clinic Dept: [phone] and follow the prompts.   For any non-urgent questions, you may also contact your provider using MyChart. We now offer e-Visits for anyone 13 and older to request care online for non-urgent symptoms. For details visit [email or web address].   Also download the MyChart app! Go to the app store, search "MyChart", open the app, select [facility], and log in with your MyChart username and password.  Due to Covid, a mask is required upon entering the hospital/clinic. If you do not have a mask, one will be given to you upon arrival. For doctor visits, patients may have 1 support person aged 18 or older with them. For treatment visits, patients cannot have anyone with them due to current Covid guidelines and our immunocompromised population.

## 2020-08-14 ENCOUNTER — Inpatient Hospital Stay: Payer: 59

## 2020-08-14 ENCOUNTER — Telehealth: Payer: Self-pay | Admitting: Hematology and Oncology

## 2020-08-14 ENCOUNTER — Other Ambulatory Visit: Payer: Self-pay

## 2020-08-14 ENCOUNTER — Other Ambulatory Visit (HOSPITAL_COMMUNITY): Payer: Self-pay

## 2020-08-14 VITALS — BP 133/86 | HR 73 | Temp 99.0°F | Resp 16

## 2020-08-14 DIAGNOSIS — D702 Other drug-induced agranulocytosis: Secondary | ICD-10-CM | POA: Diagnosis not present

## 2020-08-14 DIAGNOSIS — L658 Other specified nonscarring hair loss: Secondary | ICD-10-CM | POA: Diagnosis not present

## 2020-08-14 DIAGNOSIS — D6959 Other secondary thrombocytopenia: Secondary | ICD-10-CM | POA: Diagnosis not present

## 2020-08-14 DIAGNOSIS — Z5111 Encounter for antineoplastic chemotherapy: Secondary | ICD-10-CM | POA: Diagnosis not present

## 2020-08-14 DIAGNOSIS — Z5112 Encounter for antineoplastic immunotherapy: Secondary | ICD-10-CM | POA: Diagnosis not present

## 2020-08-14 DIAGNOSIS — D6481 Anemia due to antineoplastic chemotherapy: Secondary | ICD-10-CM | POA: Diagnosis not present

## 2020-08-14 DIAGNOSIS — Z95828 Presence of other vascular implants and grafts: Secondary | ICD-10-CM

## 2020-08-14 DIAGNOSIS — R5383 Other fatigue: Secondary | ICD-10-CM | POA: Diagnosis not present

## 2020-08-14 DIAGNOSIS — C50411 Malignant neoplasm of upper-outer quadrant of right female breast: Secondary | ICD-10-CM | POA: Diagnosis not present

## 2020-08-14 DIAGNOSIS — Z170 Estrogen receptor positive status [ER+]: Secondary | ICD-10-CM | POA: Diagnosis not present

## 2020-08-14 MED ORDER — FILGRASTIM-SNDZ 300 MCG/0.5ML IJ SOSY
PREFILLED_SYRINGE | INTRAMUSCULAR | Status: AC
  Filled 2020-08-14: qty 0.5

## 2020-08-14 MED ORDER — FILGRASTIM-SNDZ 300 MCG/0.5ML IJ SOSY
300.0000 ug | PREFILLED_SYRINGE | Freq: Once | INTRAMUSCULAR | Status: AC
  Administered 2020-08-14: 300 ug via SUBCUTANEOUS

## 2020-08-14 NOTE — Patient Instructions (Signed)
Filgrastim, G-CSF injection What is this medication? FILGRASTIM, G-CSF (fil GRA stim) is a granulocyte colony-stimulating factor that stimulates the growth of neutrophils, a type of white blood cell (WBC) important in the body's fight against infection. It is used to reduce the incidence of fever and infection in patients with certain types of cancer who are receiving chemotherapy that affects the bone marrow, to stimulate blood cell production for removal of WBCs from the body prior to a bone marrow transplantation, to reduce the incidence of fever and infection in patients who have severe chronic neutropenia, and to improve survival outcomes following high-dose radiation exposure that is toxic to the bone marrow. This medicine may be used for other purposes; ask your health care provider or pharmacist if you have questions. COMMON BRAND NAME(S): Neupogen, Nivestym, Releuko, Zarxio What should I tell my care team before I take this medication? They need to know if you have any of these conditions: kidney disease latex allergy ongoing radiation therapy sickle cell disease an unusual or allergic reaction to filgrastim, pegfilgrastim, other medicines, foods, dyes, or preservatives pregnant or trying to get pregnant breast-feeding How should I use this medication? This medicine is for injection under the skin or infusion into a vein. As an infusion into a vein, it is usually given by a health care professional in a hospital or clinic setting. If you get this medicine at home, you will be taught how to prepare and give this medicine. Refer to the Instructions for Use that come with your medication packaging. Use exactly as directed. Take your medicine at regular intervals. Do not take your medicine more often than directed. It is important that you put your used needles and syringes in a special sharps container. Do not put them in a trash can. If you do not have a sharps container, call your pharmacist  or healthcare provider to get one. Talk to your pediatrician regarding the use of this medicine in children. While this drug may be prescribed for children as young as 7 months for selected conditions, precautions do apply. Overdosage: If you think you have taken too much of this medicine contact a poison control center or emergency room at once. NOTE: This medicine is only for you. Do not share this medicine with others. What if I miss a dose? It is important not to miss your dose. Call your doctor or health care professional if you miss a dose. What may interact with this medication? This medicine may interact with the following medications: medicines that may cause a release of neutrophils, such as lithium This list may not describe all possible interactions. Give your health care provider a list of all the medicines, herbs, non-prescription drugs, or dietary supplements you use. Also tell them if you smoke, drink alcohol, or use illegal drugs. Some items may interact with your medicine. What should I watch for while using this medication? Your condition will be monitored carefully while you are receiving this medicine. You may need blood work done while you are taking this medicine. Talk to your health care provider about your risk of cancer. You may be more at risk for certain types of cancer if you take this medicine. What side effects may I notice from receiving this medication? Side effects that you should report to your doctor or health care professional as soon as possible: allergic reactions like skin rash, itching or hives, swelling of the face, lips, or tongue back pain dizziness or feeling faint fever pain, redness, or   irritation at site where injected pinpoint red spots on the skin shortness of breath or breathing problems signs and symptoms of kidney injury like trouble passing urine, change in the amount of urine, or red or dark-brown urine stomach or side pain, or pain at  the shoulder swelling tiredness unusual bleeding or bruising Side effects that usually do not require medical attention (report to your doctor or health care professional if they continue or are bothersome): bone pain cough diarrhea hair loss headache muscle pain This list may not describe all possible side effects. Call your doctor for medical advice about side effects. You may report side effects to FDA at 1-800-FDA-1088. Where should I keep my medication? Keep out of the reach of children. Store in a refrigerator between 2 and 8 degrees C (36 and 46 degrees F). Do not freeze. Keep in carton to protect from light. Throw away this medicine if vials or syringes are left out of the refrigerator for more than 24 hours. Throw away any unused medicine after the expiration date. NOTE: This sheet is a summary. It may not cover all possible information. If you have questions about this medicine, talk to your doctor, pharmacist, or health care provider.  2022 Elsevier/Gold Standard (2019-01-17 18:47:55)  

## 2020-08-14 NOTE — Telephone Encounter (Signed)
Scheduled appts per 8/5 sch msg. Pt aware.  

## 2020-08-15 ENCOUNTER — Other Ambulatory Visit: Payer: Self-pay

## 2020-08-15 ENCOUNTER — Inpatient Hospital Stay: Payer: 59

## 2020-08-15 VITALS — BP 100/75 | HR 79 | Temp 97.6°F | Resp 16

## 2020-08-15 DIAGNOSIS — C50411 Malignant neoplasm of upper-outer quadrant of right female breast: Secondary | ICD-10-CM

## 2020-08-15 DIAGNOSIS — Z5112 Encounter for antineoplastic immunotherapy: Secondary | ICD-10-CM | POA: Diagnosis not present

## 2020-08-15 DIAGNOSIS — L658 Other specified nonscarring hair loss: Secondary | ICD-10-CM | POA: Diagnosis not present

## 2020-08-15 DIAGNOSIS — Z170 Estrogen receptor positive status [ER+]: Secondary | ICD-10-CM | POA: Diagnosis not present

## 2020-08-15 DIAGNOSIS — D702 Other drug-induced agranulocytosis: Secondary | ICD-10-CM | POA: Diagnosis not present

## 2020-08-15 DIAGNOSIS — R5383 Other fatigue: Secondary | ICD-10-CM | POA: Diagnosis not present

## 2020-08-15 DIAGNOSIS — D6481 Anemia due to antineoplastic chemotherapy: Secondary | ICD-10-CM | POA: Diagnosis not present

## 2020-08-15 DIAGNOSIS — Z5111 Encounter for antineoplastic chemotherapy: Secondary | ICD-10-CM | POA: Diagnosis not present

## 2020-08-15 DIAGNOSIS — D6959 Other secondary thrombocytopenia: Secondary | ICD-10-CM | POA: Diagnosis not present

## 2020-08-15 DIAGNOSIS — Z95828 Presence of other vascular implants and grafts: Secondary | ICD-10-CM

## 2020-08-15 MED ORDER — FILGRASTIM-SNDZ 300 MCG/0.5ML IJ SOSY
300.0000 ug | PREFILLED_SYRINGE | Freq: Once | INTRAMUSCULAR | Status: AC
  Administered 2020-08-15: 300 ug via SUBCUTANEOUS

## 2020-08-15 MED ORDER — FILGRASTIM-SNDZ 300 MCG/0.5ML IJ SOSY
PREFILLED_SYRINGE | INTRAMUSCULAR | Status: AC
  Filled 2020-08-15: qty 0.5

## 2020-08-18 ENCOUNTER — Inpatient Hospital Stay: Payer: 59

## 2020-08-18 ENCOUNTER — Other Ambulatory Visit: Payer: Self-pay | Admitting: Hematology and Oncology

## 2020-08-18 ENCOUNTER — Encounter: Payer: Self-pay | Admitting: *Deleted

## 2020-08-18 ENCOUNTER — Other Ambulatory Visit: Payer: Self-pay

## 2020-08-18 VITALS — BP 99/77 | HR 86 | Temp 98.4°F | Resp 17 | Wt 125.8 lb

## 2020-08-18 DIAGNOSIS — L658 Other specified nonscarring hair loss: Secondary | ICD-10-CM | POA: Diagnosis not present

## 2020-08-18 DIAGNOSIS — R5383 Other fatigue: Secondary | ICD-10-CM | POA: Diagnosis not present

## 2020-08-18 DIAGNOSIS — Z5111 Encounter for antineoplastic chemotherapy: Secondary | ICD-10-CM | POA: Diagnosis not present

## 2020-08-18 DIAGNOSIS — C50411 Malignant neoplasm of upper-outer quadrant of right female breast: Secondary | ICD-10-CM

## 2020-08-18 DIAGNOSIS — Z170 Estrogen receptor positive status [ER+]: Secondary | ICD-10-CM

## 2020-08-18 DIAGNOSIS — D6959 Other secondary thrombocytopenia: Secondary | ICD-10-CM | POA: Diagnosis not present

## 2020-08-18 DIAGNOSIS — Z95828 Presence of other vascular implants and grafts: Secondary | ICD-10-CM

## 2020-08-18 DIAGNOSIS — D6481 Anemia due to antineoplastic chemotherapy: Secondary | ICD-10-CM | POA: Diagnosis not present

## 2020-08-18 DIAGNOSIS — D702 Other drug-induced agranulocytosis: Secondary | ICD-10-CM | POA: Diagnosis not present

## 2020-08-18 DIAGNOSIS — Z5112 Encounter for antineoplastic immunotherapy: Secondary | ICD-10-CM | POA: Diagnosis not present

## 2020-08-18 LAB — CMP (CANCER CENTER ONLY)
ALT: 31 U/L (ref 0–44)
AST: 30 U/L (ref 15–41)
Albumin: 4.2 g/dL (ref 3.5–5.0)
Alkaline Phosphatase: 83 U/L (ref 38–126)
Anion gap: 7 (ref 5–15)
BUN: 9 mg/dL (ref 6–20)
CO2: 28 mmol/L (ref 22–32)
Calcium: 9.9 mg/dL (ref 8.9–10.3)
Chloride: 105 mmol/L (ref 98–111)
Creatinine: 0.68 mg/dL (ref 0.44–1.00)
GFR, Estimated: >60 mL/min (ref >60)
Glucose, Bld: 103 mg/dL — ABNORMAL HIGH (ref 70–99)
Potassium: 3.9 mmol/L (ref 3.5–5.1)
Sodium: 140 mmol/L (ref 135–145)
Total Bilirubin: <0.2 mg/dL — ABNORMAL LOW (ref 0.3–1.2)
Total Protein: 7.5 g/dL (ref 6.5–8.1)

## 2020-08-18 LAB — CBC WITH DIFFERENTIAL (CANCER CENTER ONLY)
Abs Immature Granulocytes: 0.13 10*3/uL — ABNORMAL HIGH (ref 0.00–0.07)
Basophils Absolute: 0 10*3/uL (ref 0.0–0.1)
Basophils Relative: 0 %
Eosinophils Absolute: 0 10*3/uL (ref 0.0–0.5)
Eosinophils Relative: 0 %
HCT: 29.1 % — ABNORMAL LOW (ref 36.0–46.0)
Hemoglobin: 10 g/dL — ABNORMAL LOW (ref 12.0–15.0)
Immature Granulocytes: 3 %
Lymphocytes Relative: 32 %
Lymphs Abs: 1.3 10*3/uL (ref 0.7–4.0)
MCH: 34.1 pg — ABNORMAL HIGH (ref 26.0–34.0)
MCHC: 34.4 g/dL (ref 30.0–36.0)
MCV: 99.3 fL (ref 80.0–100.0)
Monocytes Absolute: 0.6 10*3/uL (ref 0.1–1.0)
Monocytes Relative: 15 %
Neutro Abs: 2 10*3/uL (ref 1.7–7.7)
Neutrophils Relative %: 50 %
Platelet Count: 183 10*3/uL (ref 150–400)
RBC: 2.93 MIL/uL — ABNORMAL LOW (ref 3.87–5.11)
RDW: 16.3 % — ABNORMAL HIGH (ref 11.5–15.5)
WBC Count: 4.1 10*3/uL (ref 4.0–10.5)
nRBC: 0 % (ref 0.0–0.2)

## 2020-08-18 LAB — TSH: TSH: 0.447 u[IU]/mL (ref 0.308–3.960)

## 2020-08-18 MED ORDER — ONDANSETRON HCL 4 MG/2ML IJ SOLN
INTRAMUSCULAR | Status: AC
  Filled 2020-08-18: qty 2

## 2020-08-18 MED ORDER — FAMOTIDINE 20 MG IN NS 100 ML IVPB
INTRAVENOUS | Status: AC
  Filled 2020-08-18: qty 100

## 2020-08-18 MED ORDER — SODIUM CHLORIDE 0.9% FLUSH
10.0000 mL | INTRAVENOUS | Status: DC | PRN
  Administered 2020-08-18: 10 mL
  Filled 2020-08-18: qty 10

## 2020-08-18 MED ORDER — SODIUM CHLORIDE 0.9 % IV SOLN
10.0000 mg | Freq: Once | INTRAVENOUS | Status: AC
  Administered 2020-08-18: 10 mg via INTRAVENOUS
  Filled 2020-08-18: qty 10

## 2020-08-18 MED ORDER — DIPHENHYDRAMINE HCL 50 MG/ML IJ SOLN
25.0000 mg | Freq: Once | INTRAMUSCULAR | Status: AC
  Administered 2020-08-18: 25 mg via INTRAVENOUS

## 2020-08-18 MED ORDER — HEPARIN SOD (PORK) LOCK FLUSH 100 UNIT/ML IV SOLN
500.0000 [IU] | Freq: Once | INTRAVENOUS | Status: AC | PRN
  Administered 2020-08-18: 500 [IU]
  Filled 2020-08-18: qty 5

## 2020-08-18 MED ORDER — SODIUM CHLORIDE 0.9 % IV SOLN
65.0000 mg/m2 | Freq: Once | INTRAVENOUS | Status: AC
  Administered 2020-08-18: 102 mg via INTRAVENOUS
  Filled 2020-08-18: qty 17

## 2020-08-18 MED ORDER — DIPHENHYDRAMINE HCL 50 MG/ML IJ SOLN
INTRAMUSCULAR | Status: AC
  Filled 2020-08-18: qty 1

## 2020-08-18 MED ORDER — ONDANSETRON HCL 4 MG/2ML IJ SOLN
INTRAMUSCULAR | Status: AC
  Filled 2020-08-18: qty 4

## 2020-08-18 MED ORDER — FAMOTIDINE 20 MG IN NS 100 ML IVPB
20.0000 mg | Freq: Once | INTRAVENOUS | Status: AC
  Administered 2020-08-18: 20 mg via INTRAVENOUS

## 2020-08-18 MED ORDER — SODIUM CHLORIDE 0.9 % IV SOLN
Freq: Once | INTRAVENOUS | Status: AC
  Filled 2020-08-18: qty 250

## 2020-08-18 MED ORDER — SODIUM CHLORIDE 0.9% FLUSH
10.0000 mL | Freq: Once | INTRAVENOUS | Status: AC
  Administered 2020-08-18: 10 mL
  Filled 2020-08-18: qty 10

## 2020-08-18 MED ORDER — ONDANSETRON HCL 4 MG/2ML IJ SOLN
8.0000 mg | Freq: Once | INTRAMUSCULAR | Status: AC
  Administered 2020-08-18: 8 mg via INTRAVENOUS

## 2020-08-18 NOTE — Patient Instructions (Signed)
[facility]  Discharge Instructions: Thank you for choosing [facility] to provide your oncology and hematology care.   If you have a lab appointment with the [facility], please go directly to the [facility] and check in at the registration area.   Wear comfortable clothing and clothing appropriate for easy access to any Portacath or PICC line.   We strive to give you quality time with your provider. You may need to reschedule your appointment if you arrive late (15 or more minutes).  Arriving late affects you and other patients whose appointments are after yours.  Also, if you miss three or more appointments without notifying the office, you may be dismissed from the clinic at the provider's discretion.      For prescription refill requests, have your pharmacy contact our office and allow 72 hours for refills to be completed.    Today you received the following chemotherapy and/or immunotherapy agents Paclitaxel (Taxol).       To help prevent nausea and vomiting after your treatment, we encourage you to take your nausea medication as directed.  BELOW ARE SYMPTOMS THAT SHOULD BE REPORTED IMMEDIATELY: *FEVER GREATER THAN 100.4 F (38 C) OR HIGHER *CHILLS OR SWEATING *NAUSEA AND VOMITING THAT IS NOT CONTROLLED WITH YOUR NAUSEA MEDICATION *UNUSUAL SHORTNESS OF BREATH *UNUSUAL BRUISING OR BLEEDING *URINARY PROBLEMS (pain or burning when urinating, or frequent urination) *BOWEL PROBLEMS (unusual diarrhea, constipation, pain near the anus) TENDERNESS IN MOUTH AND THROAT WITH OR WITHOUT PRESENCE OF ULCERS (sore throat, sores in mouth, or a toothache) UNUSUAL RASH, SWELLING OR PAIN  UNUSUAL VAGINAL DISCHARGE OR ITCHING   Items with * indicate a potential emergency and should be followed up as soon as possible or go to the Emergency Department if any problems should occur.  Please show the CHEMOTHERAPY ALERT CARD or IMMUNOTHERAPY ALERT CARD at  check-in to the Emergency Department and triage nurse.  Should you have questions after your visit or need to cancel or reschedule your appointment, please contact [facility]  Dept: [phone]  and follow the prompts.  Office hours are 8:00 a.m. to 4:30 p.m. Monday - Friday. Please note that voicemails left after 4:00 p.m. may not be returned until the following business day.  We are closed weekends and major holidays. You have access to a nurse at all times for urgent questions. Please call the main number to the clinic Dept: [phone] and follow the prompts.   For any non-urgent questions, you may also contact your provider using MyChart. We now offer e-Visits for anyone 40 and older to request care online for non-urgent symptoms. For details visit [email or web address].   Also download the MyChart app! Go to the app store, search "MyChart", open the app, select [facility], and log in with your MyChart username and password.  Due to Covid, a mask is required upon entering the hospital/clinic. If you do not have a mask, one will be given to you upon arrival. For doctor visits, patients may have 1 support person aged 93 or older with them. For treatment visits, patients cannot have anyone with them due to current Covid guidelines and our immunocompromised population.

## 2020-08-20 ENCOUNTER — Encounter: Payer: Self-pay | Admitting: *Deleted

## 2020-08-20 ENCOUNTER — Ambulatory Visit
Admission: RE | Admit: 2020-08-20 | Discharge: 2020-08-20 | Disposition: A | Discharge status: Home / Self Care | Payer: 59 | Source: Ambulatory Visit | Attending: Hematology and Oncology | Admitting: Hematology and Oncology

## 2020-08-20 DIAGNOSIS — Z170 Estrogen receptor positive status [ER+]: Secondary | ICD-10-CM

## 2020-08-20 DIAGNOSIS — C50912 Malignant neoplasm of unspecified site of left female breast: Secondary | ICD-10-CM | POA: Diagnosis not present

## 2020-08-20 DIAGNOSIS — C50411 Malignant neoplasm of upper-outer quadrant of right female breast: Secondary | ICD-10-CM

## 2020-08-20 DIAGNOSIS — C50911 Malignant neoplasm of unspecified site of right female breast: Secondary | ICD-10-CM | POA: Diagnosis not present

## 2020-08-20 MED ORDER — GADOBUTROL 1 MMOL/ML IV SOLN
6.0000 mL | Freq: Once | INTRAVENOUS | Status: AC | PRN
  Administered 2020-08-20: 6 mL via INTRAVENOUS

## 2020-08-24 ENCOUNTER — Other Ambulatory Visit: Payer: Self-pay | Admitting: General Surgery

## 2020-08-24 DIAGNOSIS — C50411 Malignant neoplasm of upper-outer quadrant of right female breast: Secondary | ICD-10-CM | POA: Diagnosis not present

## 2020-08-24 DIAGNOSIS — Z170 Estrogen receptor positive status [ER+]: Secondary | ICD-10-CM | POA: Diagnosis not present

## 2020-08-25 ENCOUNTER — Other Ambulatory Visit: Payer: Self-pay

## 2020-08-25 ENCOUNTER — Inpatient Hospital Stay: Payer: 59

## 2020-08-25 ENCOUNTER — Other Ambulatory Visit: Payer: Self-pay | Admitting: *Deleted

## 2020-08-25 VITALS — BP 103/68 | HR 66 | Temp 98.2°F | Resp 16

## 2020-08-25 DIAGNOSIS — D702 Other drug-induced agranulocytosis: Secondary | ICD-10-CM | POA: Diagnosis not present

## 2020-08-25 DIAGNOSIS — C50411 Malignant neoplasm of upper-outer quadrant of right female breast: Secondary | ICD-10-CM

## 2020-08-25 DIAGNOSIS — Z5112 Encounter for antineoplastic immunotherapy: Secondary | ICD-10-CM | POA: Diagnosis not present

## 2020-08-25 DIAGNOSIS — D6481 Anemia due to antineoplastic chemotherapy: Secondary | ICD-10-CM | POA: Diagnosis not present

## 2020-08-25 DIAGNOSIS — L658 Other specified nonscarring hair loss: Secondary | ICD-10-CM | POA: Diagnosis not present

## 2020-08-25 DIAGNOSIS — Z170 Estrogen receptor positive status [ER+]: Secondary | ICD-10-CM | POA: Diagnosis not present

## 2020-08-25 DIAGNOSIS — Z95828 Presence of other vascular implants and grafts: Secondary | ICD-10-CM

## 2020-08-25 DIAGNOSIS — R5383 Other fatigue: Secondary | ICD-10-CM | POA: Diagnosis not present

## 2020-08-25 DIAGNOSIS — Z5111 Encounter for antineoplastic chemotherapy: Secondary | ICD-10-CM | POA: Diagnosis not present

## 2020-08-25 DIAGNOSIS — D6959 Other secondary thrombocytopenia: Secondary | ICD-10-CM | POA: Diagnosis not present

## 2020-08-25 LAB — CBC WITH DIFFERENTIAL (CANCER CENTER ONLY)
Abs Immature Granulocytes: 0.02 10*3/uL (ref 0.00–0.07)
Basophils Absolute: 0 10*3/uL (ref 0.0–0.1)
Basophils Relative: 1 %
Eosinophils Absolute: 0 10*3/uL (ref 0.0–0.5)
Eosinophils Relative: 0 %
HCT: 27.4 % — ABNORMAL LOW (ref 36.0–46.0)
Hemoglobin: 9.5 g/dL — ABNORMAL LOW (ref 12.0–15.0)
Immature Granulocytes: 1 %
Lymphocytes Relative: 30 %
Lymphs Abs: 1.2 10*3/uL (ref 0.7–4.0)
MCH: 34.3 pg — ABNORMAL HIGH (ref 26.0–34.0)
MCHC: 34.7 g/dL (ref 30.0–36.0)
MCV: 98.9 fL (ref 80.0–100.0)
Monocytes Absolute: 0.4 10*3/uL (ref 0.1–1.0)
Monocytes Relative: 9 %
Neutro Abs: 2.4 10*3/uL (ref 1.7–7.7)
Neutrophils Relative %: 59 %
Platelet Count: 137 10*3/uL — ABNORMAL LOW (ref 150–400)
RBC: 2.77 MIL/uL — ABNORMAL LOW (ref 3.87–5.11)
RDW: 16.1 % — ABNORMAL HIGH (ref 11.5–15.5)
WBC Count: 3.9 10*3/uL — ABNORMAL LOW (ref 4.0–10.5)
nRBC: 0 % (ref 0.0–0.2)

## 2020-08-25 LAB — TSH: TSH: 0.324 u[IU]/mL (ref 0.308–3.960)

## 2020-08-25 LAB — CMP (CANCER CENTER ONLY)
ALT: 28 U/L (ref 0–44)
AST: 27 U/L (ref 15–41)
Albumin: 4.2 g/dL (ref 3.5–5.0)
Alkaline Phosphatase: 73 U/L (ref 38–126)
Anion gap: 8 (ref 5–15)
BUN: 9 mg/dL (ref 6–20)
CO2: 25 mmol/L (ref 22–32)
Calcium: 9.3 mg/dL (ref 8.9–10.3)
Chloride: 105 mmol/L (ref 98–111)
Creatinine: 0.68 mg/dL (ref 0.44–1.00)
GFR, Estimated: >60 mL/min
Glucose, Bld: 92 mg/dL (ref 70–99)
Potassium: 3.9 mmol/L (ref 3.5–5.1)
Sodium: 138 mmol/L (ref 135–145)
Total Bilirubin: 0.4 mg/dL (ref 0.3–1.2)
Total Protein: 7.3 g/dL (ref 6.5–8.1)

## 2020-08-25 MED ORDER — SODIUM CHLORIDE 0.9% FLUSH
10.0000 mL | INTRAVENOUS | Status: DC | PRN
  Administered 2020-08-25: 10 mL

## 2020-08-25 MED ORDER — SODIUM CHLORIDE 0.9 % IV SOLN
200.0000 mg | Freq: Once | INTRAVENOUS | Status: AC
  Administered 2020-08-25: 200 mg via INTRAVENOUS
  Filled 2020-08-25: qty 8

## 2020-08-25 MED ORDER — SODIUM CHLORIDE 0.9 % IV SOLN: Freq: Once | INTRAVENOUS | Status: AC

## 2020-08-25 MED ORDER — HEPARIN SOD (PORK) LOCK FLUSH 100 UNIT/ML IV SOLN
500.0000 [IU] | Freq: Once | INTRAVENOUS | Status: AC | PRN
  Administered 2020-08-25: 500 [IU]

## 2020-08-25 MED ORDER — SODIUM CHLORIDE 0.9% FLUSH
10.0000 mL | Freq: Once | INTRAVENOUS | Status: AC
  Administered 2020-08-25: 10 mL

## 2020-08-25 NOTE — Patient Instructions (Signed)
[facility]  ° Discharge Instructions: °Thank you for choosing [facility] to provide your oncology and hematology care.  ° °If you have a lab appointment with the Cancer Center, please go directly to the Cancer Center and check in at the registration area. °  °Wear comfortable clothing and clothing appropriate for easy access to any Portacath or PICC line.  ° °We strive to give you quality time with your provider. You may need to reschedule your appointment if you arrive late (15 or more minutes).  Arriving late affects you and other patients whose appointments are after yours.  Also, if you miss three or more appointments without notifying the office, you may be dismissed from the clinic at the provider’s discretion.    °  °For prescription refill requests, have your pharmacy contact our office and allow 72 hours for refills to be completed.   ° °Today you received the following chemotherapy and/or immunotherapy agents: Pembrolizumab (Keytruda) °  °To help prevent nausea and vomiting after your treatment, we encourage you to take your nausea medication as directed. ° °BELOW ARE SYMPTOMS THAT SHOULD BE REPORTED IMMEDIATELY: °*FEVER GREATER THAN 100.4 F (38 °C) OR HIGHER °*CHILLS OR SWEATING °*NAUSEA AND VOMITING THAT IS NOT CONTROLLED WITH YOUR NAUSEA MEDICATION °*UNUSUAL SHORTNESS OF BREATH °*UNUSUAL BRUISING OR BLEEDING °*URINARY PROBLEMS (pain or burning when urinating, or frequent urination) °*BOWEL PROBLEMS (unusual diarrhea, constipation, pain near the anus) °TENDERNESS IN MOUTH AND THROAT WITH OR WITHOUT PRESENCE OF ULCERS (sore throat, sores in mouth, or a toothache) °UNUSUAL RASH, SWELLING OR PAIN  °UNUSUAL VAGINAL DISCHARGE OR ITCHING  ° °Items with * indicate a potential emergency and should be followed up as soon as possible or go to the Emergency Department if any problems should occur. ° °Please show the CHEMOTHERAPY ALERT CARD or IMMUNOTHERAPY ALERT CARD  at check-in to the Emergency Department and triage nurse. ° °Should you have questions after your visit or need to cancel or reschedule your appointment, please contact [facility]  Dept: [phone]  and follow the prompts.  Office hours are 8:00 a.m. to 4:30 p.m. Monday - Friday. Please note that voicemails left after 4:00 p.m. may not be returned until the following business day.  We are closed weekends and major holidays. You have access to a nurse at all times for urgent questions. Please call the main number to the clinic Dept: [phone] and follow the prompts. ° ° °For any non-urgent questions, you may also contact your provider using MyChart. We now offer e-Visits for anyone 18 and older to request care online for non-urgent symptoms. For details visit [email or web address]. °  °Also download the MyChart app! Go to the app store, search "MyChart", open the app, select [facility], and log in with your MyChart username and password. ° °Due to Covid, a mask is required upon entering the hospital/clinic. If you do not have a mask, one will be given to you upon arrival. For doctor visits, patients may have 1 support person aged 18 or older with them. For treatment visits, patients cannot have anyone with them due to current Covid guidelines and our immunocompromised population.  ° °

## 2020-08-27 ENCOUNTER — Telehealth: Payer: Self-pay | Admitting: Hematology and Oncology

## 2020-08-27 ENCOUNTER — Encounter: Payer: Self-pay | Admitting: *Deleted

## 2020-08-27 DIAGNOSIS — Z170 Estrogen receptor positive status [ER+]: Secondary | ICD-10-CM

## 2020-08-27 DIAGNOSIS — C50411 Malignant neoplasm of upper-outer quadrant of right female breast: Secondary | ICD-10-CM

## 2020-08-27 NOTE — Telephone Encounter (Signed)
Scheduled appt per 8/18 sch msg. Pt aware.  

## 2020-09-01 ENCOUNTER — Other Ambulatory Visit: Payer: Self-pay

## 2020-09-01 ENCOUNTER — Encounter (HOSPITAL_BASED_OUTPATIENT_CLINIC_OR_DEPARTMENT_OTHER): Payer: Self-pay | Admitting: *Deleted

## 2020-09-01 DIAGNOSIS — C50411 Malignant neoplasm of upper-outer quadrant of right female breast: Secondary | ICD-10-CM | POA: Diagnosis not present

## 2020-09-01 DIAGNOSIS — Z9012 Acquired absence of left breast and nipple: Secondary | ICD-10-CM | POA: Diagnosis not present

## 2020-09-03 MED ORDER — ENSURE PRE-SURGERY PO LIQD
296.0000 mL | Freq: Once | ORAL | Status: DC
Start: 2020-09-04 — End: 2020-09-09

## 2020-09-03 NOTE — Progress Notes (Signed)

## 2020-09-08 NOTE — Anesthesia Preprocedure Evaluation (Addendum)
Anesthesia Evaluation  Patient identified by MRN, date of birth, ID band Patient awake    Reviewed: Allergy & Precautions, NPO status , Patient's Chart, lab work & pertinent test results  Airway Mallampati: I  TM Distance: >3 FB Neck ROM: Full    Dental no notable dental hx.    Pulmonary neg pulmonary ROS,    Pulmonary exam normal        Cardiovascular negative cardio ROS Normal cardiovascular exam     Neuro/Psych  Headaches, Anxiety Depression    GI/Hepatic negative GI ROS,   Endo/Other    Renal/GU   negative genitourinary   Musculoskeletal   Abdominal Normal abdominal exam  (+)   Peds  Hematology   Anesthesia Other Findings   Reproductive/Obstetrics negative OB ROS                            Anesthesia Physical Anesthesia Plan  ASA: 2  Anesthesia Plan: General   Post-op Pain Management:  Regional for Post-op pain   Induction: Intravenous  PONV Risk Score and Plan: 3 and Ondansetron, Dexamethasone and Midazolam  Airway Management Planned: Oral ETT  Additional Equipment: None  Intra-op Plan:   Post-operative Plan: Extubation in OR  Informed Consent: I have reviewed the patients History and Physical, chart, labs and discussed the procedure including the risks, benefits and alternatives for the proposed anesthesia with the patient or authorized representative who has indicated his/her understanding and acceptance.     Dental advisory given  Plan Discussed with: CRNA  Anesthesia Plan Comments:        Anesthesia Quick Evaluation

## 2020-09-08 NOTE — H&P (Signed)
40 y.o. female who is seen today for follow up for breast cancer s/p primary chemotherapy  "40 yof who lives in [place] with her husband (who is Marketing executive at [employer]) and 2 children (9/7).  she has fh of breast cancer in her mom early 62s.  she has been undergoing mm due to this. she noted a ruoq mass and then was evaluated. there was no mass by [place] at this site. however in ruoq she has 2x2 cm area of calcs noted. axillary [place] is negative. 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  she has no discharge." She underwent an MRI at the time of this that showed a right upper outer quadrant breast mass. She has also undergone genetic testing that showed she has a BRCA2 mutation. Her tumor is HER2 negative. She has tolerated chemotherapy with Kadcyla well. She has had no real issues. Her last infusion was 1 week ago. Her final MRI shows a complete radiologic response. She is here with her husband to discuss scheduling surgery. She has seen plastic surgery and is desirous of a bilateral nipple sparing mastectomy with immediate reconstruction.  Review of Systems: A complete review of systems was obtained from the patient. I have reviewed this information and discussed as appropriate with the patient. See HPI as well for other ROS.  Review of Systems  All other systems reviewed and are negative.   Medical History: Past Medical History:  Diagnosis Date   Anxiety   History of cancer   Patient Active Problem List  Diagnosis   BRCA2 gene mutation positive   Malignant neoplasm of upper outer quadrant of female breast (CMS-HCC)   Past Surgical History:  Procedure Laterality Date   CESAREAN SECTION  2012 and 2014    Allergies  Allergen Reactions   Amoxicillin Rash   Current Outpatient Medications on File Prior to Visit  Medication Sig Dispense Refill   escitalopram oxalate (LEXAPRO) 5 MG tablet Take 5 mg by  mouth once daily   loratadine (CLARITIN) 10 mg tablet Take 10 mg by mouth once daily   No current facility-administered medications on file prior to visit.   Family History  Problem Relation Age of Onset   High blood pressure (Hypertension) Mother   Breast cancer Mother   Diabetes Father    Social History   Tobacco Use  Smoking Status Never Smoker  Smokeless Tobacco Never Used    Social History   Socioeconomic History   Marital status: Married  Tobacco Use   Smoking status: Never Smoker   Smokeless tobacco: Never Used  Substance and Sexual Activity   Alcohol use: Yes   Drug use: Not Currently   Objective:  Physical Exam Constitutional:  Appearance: Normal appearance.  Chest:  Breasts:  Right: No mass or nipple discharge.  Left: No mass or nipple discharge.   Comments: Port in place right chest  Lymphadenopathy:  Upper Body:  Right upper body: No supraclavicular or axillary adenopathy.  Left upper body: No supraclavicular or axillary adenopathy.  Neurological:  Mental Status: She is alert.    Assessment and Plan:  BRCA2 positive Clinical stage II right breast cancer status post primary systemic therapy  Right nipple sparing mastectomy, right axillary sentinel lymph node biopsy, left risk reducing nipple sparing mastectomy  We discussed a sentinel lymph node biopsy as she does not appear to having lymph node involvement  right now. We discussed the performance of that with injection of tracer and dye. We discussed that there is a chance of having a positive node with a sentinel lymph node biopsy and we will await the permanent pathology to make any other first further decisions in terms of her treatment. We discussed up to a 5% risk lifetime of chronic shoulder pain as well as lymphedema associated with a sentinel lymph node biopsy. We discussed the options for treatment of the breast cancer which included lumpectomy versus a mastectomy. Due to her BRCA2 mutation  she desires bilateral mastectomies. I think for her cancer and breast size it is reasonable to proceed with nipple sparing mastectomy. We discussed incision site, excision of the nipple as well as a possible positive margin area requiring excision, loss of sensation. This will be combined with immediate expander reconstruction. I am going to schedule her for about 3 to 4 weeks after she completed chemotherapy. We discussed the risks of operation including bleeding, infection, possible reoperation. She understands her further therapy will be based on what her stages at the time of her operation.

## 2020-09-09 ENCOUNTER — Encounter (HOSPITAL_BASED_OUTPATIENT_CLINIC_OR_DEPARTMENT_OTHER)
Admission: RE | Disposition: A | Discharge status: Home / Self Care | Payer: Self-pay | Source: Home / Self Care | Attending: General Surgery

## 2020-09-09 ENCOUNTER — Other Ambulatory Visit: Payer: Self-pay

## 2020-09-09 ENCOUNTER — Observation Stay (HOSPITAL_BASED_OUTPATIENT_CLINIC_OR_DEPARTMENT_OTHER)
Admission: RE | Admit: 2020-09-09 | Discharge: 2020-09-10 | Disposition: A | Discharge status: Home / Self Care | Payer: 59 | Attending: General Surgery | Admitting: General Surgery

## 2020-09-09 ENCOUNTER — Ambulatory Visit (HOSPITAL_BASED_OUTPATIENT_CLINIC_OR_DEPARTMENT_OTHER): Payer: 59 | Admitting: Anesthesiology

## 2020-09-09 ENCOUNTER — Encounter (HOSPITAL_BASED_OUTPATIENT_CLINIC_OR_DEPARTMENT_OTHER): Payer: Self-pay | Admitting: General Surgery

## 2020-09-09 ENCOUNTER — Encounter (HOSPITAL_COMMUNITY)
Admission: RE | Admit: 2020-09-09 | Discharge: 2020-09-09 | Disposition: A | Discharge status: Home / Self Care | Payer: 59 | Source: Ambulatory Visit | Attending: General Surgery | Admitting: General Surgery

## 2020-09-09 ENCOUNTER — Other Ambulatory Visit (HOSPITAL_COMMUNITY): Payer: Self-pay

## 2020-09-09 DIAGNOSIS — C50411 Malignant neoplasm of upper-outer quadrant of right female breast: Secondary | ICD-10-CM

## 2020-09-09 DIAGNOSIS — R921 Mammographic calcification found on diagnostic imaging of breast: Secondary | ICD-10-CM | POA: Diagnosis not present

## 2020-09-09 DIAGNOSIS — N62 Hypertrophy of breast: Secondary | ICD-10-CM | POA: Diagnosis not present

## 2020-09-09 DIAGNOSIS — C50212 Malignant neoplasm of upper-inner quadrant of left female breast: Principal | ICD-10-CM | POA: Insufficient documentation

## 2020-09-09 DIAGNOSIS — Z170 Estrogen receptor positive status [ER+]: Secondary | ICD-10-CM | POA: Diagnosis not present

## 2020-09-09 DIAGNOSIS — G8918 Other acute postprocedural pain: Secondary | ICD-10-CM | POA: Diagnosis not present

## 2020-09-09 DIAGNOSIS — C50911 Malignant neoplasm of unspecified site of right female breast: Secondary | ICD-10-CM | POA: Diagnosis present

## 2020-09-09 DIAGNOSIS — N6012 Diffuse cystic mastopathy of left breast: Secondary | ICD-10-CM | POA: Diagnosis not present

## 2020-09-09 DIAGNOSIS — Z1501 Genetic susceptibility to malignant neoplasm of breast: Secondary | ICD-10-CM | POA: Diagnosis not present

## 2020-09-09 DIAGNOSIS — N6092 Unspecified benign mammary dysplasia of left breast: Secondary | ICD-10-CM | POA: Diagnosis not present

## 2020-09-09 HISTORY — PX: NIPPLE SPARING MASTECTOMY: SHX6537

## 2020-09-09 HISTORY — PX: NIPPLE SPARING MASTECTOMY WITH SENTINEL LYMPH NODE BIOPSY: SHX6826

## 2020-09-09 HISTORY — PX: BREAST RECONSTRUCTION WITH PLACEMENT OF TISSUE EXPANDER AND ALLODERM: SHX6805

## 2020-09-09 LAB — POCT PREGNANCY, URINE: Preg Test, Ur: NEGATIVE

## 2020-09-09 SURGERY — NIPPLE SPARING MASTECTOMY WITH SENTINEL LYMPH NODE BIOPSY
Anesthesia: General | Site: Breast | Laterality: Right

## 2020-09-09 MED ORDER — ROCURONIUM BROMIDE 10 MG/ML (PF) SYRINGE
PREFILLED_SYRINGE | INTRAVENOUS | Status: AC
  Filled 2020-09-09: qty 10

## 2020-09-09 MED ORDER — CEFAZOLIN SODIUM-DEXTROSE 2-4 GM/100ML-% IV SOLN
INTRAVENOUS | Status: AC
  Filled 2020-09-09: qty 100

## 2020-09-09 MED ORDER — METHOCARBAMOL 500 MG PO TABS
500.0000 mg | ORAL_TABLET | Freq: Four times a day (QID) | ORAL | Status: DC | PRN
  Administered 2020-09-09 – 2020-09-10 (×3): 500 mg via ORAL
  Filled 2020-09-09 (×3): qty 1

## 2020-09-09 MED ORDER — PROPOFOL 500 MG/50ML IV EMUL
INTRAVENOUS | Status: DC | PRN
  Administered 2020-09-09: 15 ug/kg/min via INTRAVENOUS

## 2020-09-09 MED ORDER — PROPOFOL 10 MG/ML IV BOLUS
INTRAVENOUS | Status: DC | PRN
  Administered 2020-09-09: 150 mg via INTRAVENOUS

## 2020-09-09 MED ORDER — ACETAMINOPHEN 500 MG PO TABS
1000.0000 mg | ORAL_TABLET | ORAL | Status: AC
  Administered 2020-09-09: 1000 mg via ORAL

## 2020-09-09 MED ORDER — METHOCARBAMOL 500 MG PO TABS
500.0000 mg | ORAL_TABLET | Freq: Three times a day (TID) | ORAL | 0 refills | Status: DC | PRN
  Filled 2020-09-09: qty 20, 7d supply, fill #0

## 2020-09-09 MED ORDER — MIDAZOLAM HCL 2 MG/2ML IJ SOLN
2.0000 mg | Freq: Once | INTRAMUSCULAR | Status: AC
  Administered 2020-09-09: 2 mg via INTRAVENOUS

## 2020-09-09 MED ORDER — PROPOFOL 500 MG/50ML IV EMUL
INTRAVENOUS | Status: AC
  Filled 2020-09-09: qty 50

## 2020-09-09 MED ORDER — EPHEDRINE SULFATE 50 MG/ML IJ SOLN
INTRAMUSCULAR | Status: DC | PRN
  Administered 2020-09-09 (×3): 10 mg via INTRAVENOUS

## 2020-09-09 MED ORDER — DEXAMETHASONE SODIUM PHOSPHATE 4 MG/ML IJ SOLN
INTRAMUSCULAR | Status: DC | PRN
  Administered 2020-09-09: 10 mg via INTRAVENOUS
  Administered 2020-09-09: 2 mg via INTRAVENOUS

## 2020-09-09 MED ORDER — FENTANYL CITRATE (PF) 100 MCG/2ML IJ SOLN
INTRAMUSCULAR | Status: AC
  Filled 2020-09-09: qty 2

## 2020-09-09 MED ORDER — MIDAZOLAM HCL 2 MG/2ML IJ SOLN
INTRAMUSCULAR | Status: AC
  Filled 2020-09-09: qty 2

## 2020-09-09 MED ORDER — TECHNETIUM TC 99M TILMANOCEPT KIT
1.0000 | PACK | Freq: Once | INTRAVENOUS | Status: AC | PRN
  Administered 2020-09-09: 1 via INTRADERMAL

## 2020-09-09 MED ORDER — DIPHENHYDRAMINE HCL 50 MG/ML IJ SOLN
INTRAMUSCULAR | Status: DC | PRN
  Administered 2020-09-09: 12.5 mg via INTRAVENOUS

## 2020-09-09 MED ORDER — SUCCINYLCHOLINE CHLORIDE 200 MG/10ML IV SOSY
PREFILLED_SYRINGE | INTRAVENOUS | Status: AC
  Filled 2020-09-09: qty 10

## 2020-09-09 MED ORDER — SIMETHICONE 80 MG PO CHEW: 40.0000 mg | CHEWABLE_TABLET | Freq: Four times a day (QID) | ORAL | Status: DC | PRN

## 2020-09-09 MED ORDER — BUPIVACAINE HCL (PF) 0.25 % IJ SOLN
INTRAMUSCULAR | Status: AC
  Filled 2020-09-09: qty 30

## 2020-09-09 MED ORDER — LIDOCAINE HCL (CARDIAC) PF 100 MG/5ML IV SOSY
PREFILLED_SYRINGE | INTRAVENOUS | Status: DC | PRN
  Administered 2020-09-09: 60 mg via INTRAVENOUS

## 2020-09-09 MED ORDER — FENTANYL CITRATE (PF) 100 MCG/2ML IJ SOLN
100.0000 ug | Freq: Once | INTRAMUSCULAR | Status: AC
  Administered 2020-09-09: 100 ug via INTRAVENOUS

## 2020-09-09 MED ORDER — CEFAZOLIN SODIUM-DEXTROSE 2-4 GM/100ML-% IV SOLN
2.0000 g | Freq: Three times a day (TID) | INTRAVENOUS | Status: DC
Start: 2020-09-09 — End: 2020-09-10
  Administered 2020-09-09 – 2020-09-10 (×2): 2 g via INTRAVENOUS
  Filled 2020-09-09 (×2): qty 100

## 2020-09-09 MED ORDER — ROCURONIUM BROMIDE 100 MG/10ML IV SOLN
INTRAVENOUS | Status: DC | PRN
Start: 2020-09-09 — End: 2020-09-09
  Administered 2020-09-09: 50 mg via INTRAVENOUS

## 2020-09-09 MED ORDER — LACTATED RINGERS IV SOLN: INTRAVENOUS | Status: DC

## 2020-09-09 MED ORDER — NON FORMULARY
Status: DC | PRN
  Administered 2020-09-09: 2 mL via INTRADERMAL

## 2020-09-09 MED ORDER — DIPHENHYDRAMINE HCL 50 MG/ML IJ SOLN
INTRAMUSCULAR | Status: AC
  Filled 2020-09-09: qty 1

## 2020-09-09 MED ORDER — SUGAMMADEX SODIUM 200 MG/2ML IV SOLN
INTRAVENOUS | Status: DC | PRN
  Administered 2020-09-09: 150 mg via INTRAVENOUS

## 2020-09-09 MED ORDER — DEXAMETHASONE SODIUM PHOSPHATE 10 MG/ML IJ SOLN
INTRAMUSCULAR | Status: DC | PRN
  Administered 2020-09-09 (×2): 10 mg

## 2020-09-09 MED ORDER — ONDANSETRON HCL 4 MG/2ML IJ SOLN
INTRAMUSCULAR | Status: AC
  Filled 2020-09-09: qty 2

## 2020-09-09 MED ORDER — HYDROMORPHONE HCL 1 MG/ML IJ SOLN
0.2500 mg | INTRAMUSCULAR | Status: DC | PRN
  Administered 2020-09-09: 0.5 mg via INTRAVENOUS

## 2020-09-09 MED ORDER — POVIDONE-IODINE 10 % EX SOLN
CUTANEOUS | Status: DC | PRN
  Administered 2020-09-09: 1 via TOPICAL

## 2020-09-09 MED ORDER — ACETAMINOPHEN 500 MG PO TABS
1000.0000 mg | ORAL_TABLET | Freq: Four times a day (QID) | ORAL | Status: DC
  Administered 2020-09-09 – 2020-09-10 (×3): 1000 mg via ORAL
  Filled 2020-09-09 (×3): qty 2

## 2020-09-09 MED ORDER — PROMETHAZINE HCL 25 MG/ML IJ SOLN: 6.2500 mg | INTRAMUSCULAR | Status: DC | PRN

## 2020-09-09 MED ORDER — OXYCODONE HCL 5 MG PO TABS
5.0000 mg | ORAL_TABLET | ORAL | Status: DC | PRN
  Administered 2020-09-09 – 2020-09-10 (×3): 5 mg via ORAL
  Filled 2020-09-09 (×3): qty 1

## 2020-09-09 MED ORDER — ONDANSETRON HCL 4 MG/2ML IJ SOLN: 4.0000 mg | Freq: Four times a day (QID) | INTRAMUSCULAR | Status: DC | PRN

## 2020-09-09 MED ORDER — DEXAMETHASONE SODIUM PHOSPHATE 10 MG/ML IJ SOLN
INTRAMUSCULAR | Status: AC
  Filled 2020-09-09: qty 1

## 2020-09-09 MED ORDER — KETOROLAC TROMETHAMINE 15 MG/ML IJ SOLN
INTRAMUSCULAR | Status: AC
  Filled 2020-09-09: qty 1

## 2020-09-09 MED ORDER — PHENYLEPHRINE 40 MCG/ML (10ML) SYRINGE FOR IV PUSH (FOR BLOOD PRESSURE SUPPORT)
PREFILLED_SYRINGE | INTRAVENOUS | Status: AC
  Filled 2020-09-09: qty 10

## 2020-09-09 MED ORDER — MIDAZOLAM HCL 5 MG/5ML IJ SOLN
INTRAMUSCULAR | Status: DC | PRN
  Administered 2020-09-09: 1 mg via INTRAVENOUS

## 2020-09-09 MED ORDER — ESCITALOPRAM OXALATE 10 MG PO TABS
10.0000 mg | ORAL_TABLET | Freq: Every day | ORAL | Status: DC
  Administered 2020-09-09: 10 mg via ORAL
  Filled 2020-09-09: qty 1

## 2020-09-09 MED ORDER — MEPERIDINE HCL 25 MG/ML IJ SOLN: 6.2500 mg | INTRAMUSCULAR | Status: DC | PRN

## 2020-09-09 MED ORDER — OXYCODONE HCL 5 MG PO TABS
5.0000 mg | ORAL_TABLET | ORAL | 0 refills | Status: DC | PRN
  Filled 2020-09-09: qty 20, 4d supply, fill #0

## 2020-09-09 MED ORDER — SODIUM CHLORIDE 0.9 % IV SOLN
INTRAVENOUS | Status: AC
  Filled 2020-09-09: qty 10

## 2020-09-09 MED ORDER — HYDROMORPHONE HCL 1 MG/ML IJ SOLN
INTRAMUSCULAR | Status: AC
  Filled 2020-09-09: qty 0.5

## 2020-09-09 MED ORDER — FENTANYL CITRATE (PF) 100 MCG/2ML IJ SOLN
INTRAMUSCULAR | Status: DC | PRN
  Administered 2020-09-09: 50 ug via INTRAVENOUS

## 2020-09-09 MED ORDER — ACETAMINOPHEN 500 MG PO TABS
ORAL_TABLET | ORAL | Status: AC
  Filled 2020-09-09: qty 2

## 2020-09-09 MED ORDER — KETOROLAC TROMETHAMINE 15 MG/ML IJ SOLN
15.0000 mg | INTRAMUSCULAR | Status: AC
  Administered 2020-09-09: 15 mg via INTRAVENOUS

## 2020-09-09 MED ORDER — CEFAZOLIN SODIUM-DEXTROSE 2-4 GM/100ML-% IV SOLN
2.0000 g | INTRAVENOUS | Status: AC
  Administered 2020-09-09: 2 g via INTRAVENOUS

## 2020-09-09 MED ORDER — SULFAMETHOXAZOLE-TRIMETHOPRIM 800-160 MG PO TABS
1.0000 | ORAL_TABLET | Freq: Two times a day (BID) | ORAL | 0 refills | Status: DC
  Filled 2020-09-09: qty 12, 6d supply, fill #0

## 2020-09-09 MED ORDER — EPHEDRINE 5 MG/ML INJ
INTRAVENOUS | Status: AC
  Filled 2020-09-09: qty 10

## 2020-09-09 MED ORDER — BUPIVACAINE HCL (PF) 0.25 % IJ SOLN
INTRAMUSCULAR | Status: DC | PRN
  Administered 2020-09-09 (×12): 5 mL via PERINEURAL

## 2020-09-09 MED ORDER — CHLORHEXIDINE GLUCONATE CLOTH 2 % EX PADS: 6.0000 | MEDICATED_PAD | Freq: Once | CUTANEOUS | Status: DC

## 2020-09-09 MED ORDER — ONDANSETRON HCL 4 MG/2ML IJ SOLN
INTRAMUSCULAR | Status: DC | PRN
  Administered 2020-09-09: 4 mg via INTRAVENOUS

## 2020-09-09 MED ORDER — CLONIDINE HCL (ANALGESIA) 100 MCG/ML EP SOLN
EPIDURAL | Status: DC | PRN
  Administered 2020-09-09 (×2): 100 ug

## 2020-09-09 MED ORDER — MORPHINE SULFATE (PF) 4 MG/ML IV SOLN
1.0000 mg | INTRAVENOUS | Status: DC | PRN
Start: 2020-09-09 — End: 2020-09-10

## 2020-09-09 MED ORDER — LIDOCAINE HCL (PF) 2 % IJ SOLN
INTRAMUSCULAR | Status: AC
  Filled 2020-09-09: qty 5

## 2020-09-09 MED ORDER — ONDANSETRON 4 MG PO TBDP: 4.0000 mg | ORAL_TABLET | Freq: Four times a day (QID) | ORAL | Status: DC | PRN

## 2020-09-09 MED ORDER — KETOROLAC TROMETHAMINE 30 MG/ML IJ SOLN: 30.0000 mg | Freq: Once | INTRAMUSCULAR | Status: DC | PRN

## 2020-09-09 MED ORDER — SODIUM CHLORIDE 0.9 % IV SOLN: INTRAVENOUS | Status: DC

## 2020-09-09 MED ORDER — SODIUM CHLORIDE 0.9 % IV SOLN
INTRAVENOUS | Status: DC | PRN
  Administered 2020-09-09: 600 mL

## 2020-09-09 SURGICAL SUPPLY — 109 items
ALLOGRAFT PERF 16X20 1.6+/-0.4 (Tissue) ×8 IMPLANT
APPLIER CLIP 11 MED OPEN (CLIP) ×4
APPLIER CLIP 9.375 MED OPEN (MISCELLANEOUS)
BAG DECANTER FOR FLEXI CONT (MISCELLANEOUS) ×4 IMPLANT
BENZOIN TINCTURE PRP APPL 2/3 (GAUZE/BANDAGES/DRESSINGS) IMPLANT
BINDER BREAST LRG (GAUZE/BANDAGES/DRESSINGS) ×4 IMPLANT
BINDER BREAST MEDIUM (GAUZE/BANDAGES/DRESSINGS) IMPLANT
BINDER BREAST XLRG (GAUZE/BANDAGES/DRESSINGS) IMPLANT
BINDER BREAST XXLRG (GAUZE/BANDAGES/DRESSINGS) IMPLANT
BIOPATCH RED 1 DISK 7.0 (GAUZE/BANDAGES/DRESSINGS) IMPLANT
BLADE CLIPPER SURG (BLADE) IMPLANT
BLADE HEX COATED 2.75 (ELECTRODE) ×4 IMPLANT
BLADE SURG 10 STRL SS (BLADE) ×8 IMPLANT
BLADE SURG 15 STRL LF DISP TIS (BLADE) ×6 IMPLANT
BLADE SURG 15 STRL SS (BLADE) ×2
BNDG GAUZE ELAST 4 BULKY (GAUZE/BANDAGES/DRESSINGS) ×8 IMPLANT
CANISTER SUCT 1200ML W/VALVE (MISCELLANEOUS) ×4 IMPLANT
CHLORAPREP W/TINT 26 (MISCELLANEOUS) ×8 IMPLANT
CLIP APPLIE 11 MED OPEN (CLIP) ×3 IMPLANT
CLIP APPLIE 9.375 MED OPEN (MISCELLANEOUS) IMPLANT
CLIP TI WIDE RED SMALL 6 (CLIP) IMPLANT
COUNTER NEEDLE 1200 MAGNETIC (NEEDLE) ×4 IMPLANT
COVER BACK TABLE 60X90IN (DRAPES) ×4 IMPLANT
COVER MAYO STAND STRL (DRAPES) ×8 IMPLANT
COVER PROBE W GEL 5X96 (DRAPES) ×8 IMPLANT
DECANTER SPIKE VIAL GLASS SM (MISCELLANEOUS) IMPLANT
DERMABOND ADVANCED (GAUZE/BANDAGES/DRESSINGS) ×2
DERMABOND ADVANCED .7 DNX12 (GAUZE/BANDAGES/DRESSINGS) ×6 IMPLANT
DRAIN CHANNEL 15F RND FF W/TCR (WOUND CARE) ×8 IMPLANT
DRAIN CHANNEL 19F RND (DRAIN) ×8 IMPLANT
DRAPE INCISE IOBAN 66X45 STRL (DRAPES) ×4 IMPLANT
DRAPE TOP ARMCOVERS (MISCELLANEOUS) ×4 IMPLANT
DRAPE U-SHAPE 76X120 STRL (DRAPES) ×8 IMPLANT
DRAPE UTILITY XL STRL (DRAPES) ×4 IMPLANT
DRSG PAD ABDOMINAL 8X10 ST (GAUZE/BANDAGES/DRESSINGS) ×8 IMPLANT
DRSG TEGADERM 2-3/8X2-3/4 SM (GAUZE/BANDAGES/DRESSINGS) IMPLANT
DRSG TEGADERM 4X10 (GAUZE/BANDAGES/DRESSINGS) ×12 IMPLANT
ELECT BLADE 4.0 EZ CLEAN MEGAD (MISCELLANEOUS) ×4
ELECT BLADE 6.5 EXT (BLADE) IMPLANT
ELECT COATED BLADE 2.86 ST (ELECTRODE) ×4 IMPLANT
ELECT REM PT RETURN 9FT ADLT (ELECTROSURGICAL) ×4
ELECTRODE BLDE 4.0 EZ CLN MEGD (MISCELLANEOUS) ×3 IMPLANT
ELECTRODE REM PT RTRN 9FT ADLT (ELECTROSURGICAL) ×3 IMPLANT
EVACUATOR SILICONE 100CC (DRAIN) ×16 IMPLANT
EXPANDER BREAST MX FOURT 250CC (Breast) ×6 IMPLANT
EXPNDR BREAST MX FOURTE 250CC (Breast) ×8 IMPLANT
GAUZE SPONGE 4X4 12PLY STRL (GAUZE/BANDAGES/DRESSINGS) IMPLANT
GAUZE SPONGE 4X4 12PLY STRL LF (GAUZE/BANDAGES/DRESSINGS) IMPLANT
GLOVE SURG ENC MOIS LTX SZ6 (GLOVE) ×8 IMPLANT
GLOVE SURG ENC MOIS LTX SZ6.5 (GLOVE) ×8 IMPLANT
GLOVE SURG ENC MOIS LTX SZ7 (GLOVE) ×8 IMPLANT
GLOVE SURG HYDRASOFT LTX SZ5.5 (GLOVE) ×8 IMPLANT
GLOVE SURG UNDER POLY LF SZ6.5 (GLOVE) ×8 IMPLANT
GLOVE SURG UNDER POLY LF SZ7.5 (GLOVE) ×4 IMPLANT
GOWN STRL REUS W/ TWL LRG LVL3 (GOWN DISPOSABLE) ×12 IMPLANT
GOWN STRL REUS W/TWL LRG LVL3 (GOWN DISPOSABLE) ×4
HEMOSTAT ARISTA ABSORB 3G PWDR (HEMOSTASIS) IMPLANT
ILLUMINATOR WAVEGUIDE N/F (MISCELLANEOUS) IMPLANT
IV NS 500ML (IV SOLUTION)
IV NS 500ML BAXH (IV SOLUTION) IMPLANT
KIT FILL SYSTEM UNIVERSAL (SET/KITS/TRAYS/PACK) IMPLANT
KIT MARKER MARGIN INK (KITS) IMPLANT
LIGHT WAVEGUIDE WIDE FLAT (MISCELLANEOUS) ×4 IMPLANT
MARKER SKIN DUAL TIP RULER LAB (MISCELLANEOUS) IMPLANT
NDL SAFETY ECLIPSE 18X1.5 (NEEDLE) ×3 IMPLANT
NEEDLE HYPO 18GX1.5 SHARP (NEEDLE) ×1
NEEDLE HYPO 25X1 1.5 SAFETY (NEEDLE) ×4 IMPLANT
NS IRRIG 1000ML POUR BTL (IV SOLUTION) ×4 IMPLANT
PACK BASIN DAY SURGERY FS (CUSTOM PROCEDURE TRAY) ×4 IMPLANT
PENCIL SMOKE EVACUATOR (MISCELLANEOUS) ×4 IMPLANT
PIN SAFETY STERILE (MISCELLANEOUS) ×4 IMPLANT
PUNCH BIOPSY DERMAL 4MM (MISCELLANEOUS) IMPLANT
RETRACTOR ONETRAX LX 90X20 (MISCELLANEOUS) IMPLANT
SHEET MEDIUM DRAPE 40X70 STRL (DRAPES) ×8 IMPLANT
SLEEVE SCD COMPRESS KNEE MED (STOCKING) ×4 IMPLANT
SPONGE T-LAP 18X18 ~~LOC~~+RFID (SPONGE) ×12 IMPLANT
SPONGE T-LAP 4X18 ~~LOC~~+RFID (SPONGE) IMPLANT
STAPLER VISISTAT 35W (STAPLE) ×4 IMPLANT
STRIP CLOSURE SKIN 1/2X4 (GAUZE/BANDAGES/DRESSINGS) IMPLANT
SUT CHROMIC 4 0 PS 2 18 (SUTURE) ×20 IMPLANT
SUT ETHIBOND 2-0 V-5 NEEDLE (SUTURE) IMPLANT
SUT ETHILON 2 0 FS 18 (SUTURE) ×8 IMPLANT
SUT ETHILON 3 0 PS 1 (SUTURE) IMPLANT
SUT MNCRL AB 3-0 PS2 18 (SUTURE) IMPLANT
SUT MNCRL AB 4-0 PS2 18 (SUTURE) ×12 IMPLANT
SUT PDS AB 2-0 CT2 27 (SUTURE) IMPLANT
SUT SILK 2 0 SH (SUTURE) ×4 IMPLANT
SUT VIC AB 2-0 SH 27 (SUTURE) ×1
SUT VIC AB 2-0 SH 27XBRD (SUTURE) ×3 IMPLANT
SUT VIC AB 3-0 54X BRD REEL (SUTURE) IMPLANT
SUT VIC AB 3-0 BRD 54 (SUTURE)
SUT VIC AB 3-0 SH 27 (SUTURE) ×3
SUT VIC AB 3-0 SH 27X BRD (SUTURE) ×9 IMPLANT
SUT VICRYL 0 CT-2 (SUTURE) ×12 IMPLANT
SUT VICRYL 3-0 CR8 SH (SUTURE) IMPLANT
SUT VICRYL 4-0 PS2 18IN ABS (SUTURE) ×8 IMPLANT
SUT VLOC 180 0 24IN GS25 (SUTURE) ×8 IMPLANT
SYR 10ML LL (SYRINGE) ×4 IMPLANT
SYR 50ML LL SCALE MARK (SYRINGE) ×4 IMPLANT
SYR BULB IRRIG 60ML STRL (SYRINGE) ×8 IMPLANT
SYR CONTROL 10ML LL (SYRINGE) IMPLANT
TAPE MEASURE VINYL STERILE (MISCELLANEOUS) IMPLANT
TOWEL GREEN STERILE FF (TOWEL DISPOSABLE) ×8 IMPLANT
TRACER MAGTRACE VIAL (MISCELLANEOUS) ×4 IMPLANT
TRAY DSU PREP LF (CUSTOM PROCEDURE TRAY) ×4 IMPLANT
TRAY FOLEY W/BAG SLVR 14FR LF (SET/KITS/TRAYS/PACK) ×4 IMPLANT
TUBE CONNECTING 20X1/4 (TUBING) ×4 IMPLANT
UNDERPAD 30X36 HEAVY ABSORB (UNDERPADS AND DIAPERS) ×8 IMPLANT
YANKAUER SUCT BULB TIP NO VENT (SUCTIONS) ×4 IMPLANT

## 2020-09-09 NOTE — Interval H&P Note (Signed)
History and Physical Interval Note:  09/09/2020 6:42 AM  Sydney Herman  has presented today for surgery, with the diagnosis of BREAST CANCER.  The various methods of treatment have been discussed with the patient and family. After consideration of risks, benefits and other options for treatment, the patient has consented to  Procedure(s): RIGHT NIPPLE SPARING MASTECTOMY WITH RIGHT AXILLARY SENTINEL LYMPH NODE BIOPSY (Right) LEFT NIPPLE SPARING MASTECTOMY (Left) BILATERAL BREAST RECONSTRUCTION WITH PLACEMENT OF TISSUE EXPANDER AND ALLODERM (Bilateral) as a surgical intervention.  The patient's history has been reviewed, patient examined, no change in status, stable for surgery.  I have reviewed the patient's chart and labs.  Questions were answered to the patient's satisfaction.     Rolm Bookbinder

## 2020-09-09 NOTE — Anesthesia Postprocedure Evaluation (Signed)
Anesthesia Post Note  Patient: ANIS LEVENSTEIN  Procedure(s) Performed: RIGHT NIPPLE SPARING MASTECTOMY WITH RIGHT AXILLARY SENTINEL LYMPH NODE BIOPSY (Right: Breast) LEFT NIPPLE SPARING MASTECTOMY (Left: Breast) BILATERAL BREAST RECONSTRUCTION WITH PLACEMENT OF TISSUE EXPANDER AND ALLODERM (Bilateral: Breast)     Patient location during evaluation: PACU Anesthesia Type: General Level of consciousness: awake and sedated Pain management: pain level controlled Vital Signs Assessment: post-procedure vital signs reviewed and stable Respiratory status: spontaneous breathing Cardiovascular status: stable Postop Assessment: no apparent nausea or vomiting Anesthetic complications: no   No notable events documented.  Last Vitals:  Vitals:   09/09/20 1130 09/09/20 1145  BP: (!) 108/59 104/61  Pulse: (!) 110 (!) 101  Resp: 14 13  Temp:    SpO2: 97% 97%    Last Pain:  Vitals:   09/09/20 1130  TempSrc:   PainSc: [place]

## 2020-09-09 NOTE — Progress Notes (Signed)
Nuc med inj performed by nuc med staff. Pt tol well with no additional sedation. VSS. Emotional support provided.

## 2020-09-09 NOTE — Anesthesia Procedure Notes (Signed)
Procedure Name: Intubation Date/Time: 09/09/2020 7:45 AM Performed by: Willa Frater, CRNA Pre-anesthesia Checklist: Patient identified, Emergency Drugs available, Suction available and Patient being monitored Patient Re-evaluated:Patient Re-evaluated prior to induction Oxygen Delivery Method: Circle system utilized Preoxygenation: Pre-oxygenation with 100% oxygen Induction Type: IV induction Ventilation: Mask ventilation without difficulty Laryngoscope Size: Mac and 3 Grade View: Grade I Tube type: Oral Tube size: 7.0 mm Number of attempts: 1 Airway Equipment and Method: Stylet and Oral airway Placement Confirmation: ETT inserted through vocal cords under direct vision, positive ETCO2 and breath sounds checked- equal and bilateral Tube secured with: Tape Dental Injury: Teeth and Oropharynx as per pre-operative assessment

## 2020-09-09 NOTE — Op Note (Signed)
Operative Note   DATE OF OPERATION: 8.31.22  LOCATION: [facility]-observation  SURGICAL DIVISION: Plastic Surgery  PREOPERATIVE DIAGNOSES:  1. Right breast cancer UOQ ER+ 2.BRCA2  POSTOPERATIVE DIAGNOSES:  same  PROCEDURE:  1. Bilateral breast reconstruction with tissue expanders 2. Acellular dermis (Alloderm) to bilateral chest  SURGEON: Irene Limbo MD MBA  ASSISTANT: none  ANESTHESIA:  General.   EBL: 90 ml for entire procedure  COMPLICATIONS: None immediate.   INDICATIONS FOR PROCEDURE:  The patient, Sydney Herman, is a 40 y.o. female born on November 04, 1980, is here for immediate tissue expander acellular dermis reconstruction following nipple sparing masetctomies.    FINDINGS: Natrelle 133S MV- 11 T 250 ml tissue expanders placed bilateral. Initial fill volume 125 ml air bilateral. RIGHT SN [id number] LEFT SN [id number]  DESCRIPTION OF PROCEDURE:  The patient was marked with the patient in the preoperative area to mark sternal notch, chest midline, anterior axillary lines and inframammary folds. Following induction, Foley catheter placed. The patient's operative site was prepped and draped in a sterile fashion. A time out was performed and all information was confirmed to be correct. I assisted in mastectomies with exposure and retraction. Following completion of mastectomies, reconstruction began on the left side. Hemostasis ensured. Cavity irrigated with saline solution containing Ancef, gentamicin, and Betadine. A 19 Fr drain was placed in subcutaneous position laterally and a 15 Fr drain placed along inframammary fold. Each secured to skin with 2-0 nylon. The tissue expanders were prepared on back table prior in insertion. The expander was filled with air. Perforated acellular dermis was draped over anterior surface expander. The ADM was then secured to itself over posterior surface of expander with 4-0 chromic. Redundant folds acellular dermis excised so that the ADM lay  flat without folds over air filled expander. The expander was secured to fascia over lateral sternal border with a 0 vicryl. The lateral tab was also secured to pectoralis muscle with 0-vicryl. The ADM was secured to pectoralis muscle and chest wall along inferior border at inframammary fold with 0 V-lock suture. Laterally the mastectomy flap over posterior axillary line was advanced anteriorly and the subcutaneous tissue and superficial fascia was secured to pectoralis and serratus muscles with 0-vicryl. Skin closure completed with 3-0 vicryl in fascial layer and 4-0 vicryl in dermis. Skin closure completed with 4-0 monocryl subcuticular and tissue adhesive.   I then directed my attention to right chest where similar irrigation and drain placement completed. The prepared expander with ADM secured over anterior surface was placed in left chest and tabs secured to chest wall and pectoralis muscle with 0- vicryl suture. The acellular dermis at inframammary fold was secured to chest wall with 0 V-lock suture. Laterally the mastectomy flap over posterior axillary line was advanced anteriorly and the subcutaneous tissue and superficial fascia was secured to pectoralis and serratus muscles with 0-vicryl. Skin closure completed with 3-0 vicryl in fascial layer and 4-0 vicryl in dermis. Skin closure completed with 4-0 monocryl subcuticular. Steri strips applied to right axillary incision. The mastectomy flaps were redraped so that NAC was symmetric from sternal notch and chest midline. Tissue adhesive applied over all incisions. Tegaderm dressings applied followed by dry dressing, breast binder.  The patient was allowed to wake from anesthesia, extubated and taken to the recovery room in satisfactory condition.   SPECIMENS: none  DRAINS: 15 and 19 Fr JP in right and left breast reconstruction

## 2020-09-09 NOTE — Transfer of Care (Signed)
Immediate Anesthesia Transfer of Care Note  Patient: NATOYA RASH  Procedure(s) Performed: RIGHT NIPPLE SPARING MASTECTOMY WITH RIGHT AXILLARY SENTINEL LYMPH NODE BIOPSY (Right: Breast) LEFT NIPPLE SPARING MASTECTOMY (Left: Breast) BILATERAL BREAST RECONSTRUCTION WITH PLACEMENT OF TISSUE EXPANDER AND ALLODERM (Bilateral: Breast)  Patient Location: PACU  Anesthesia Type:General  Level of Consciousness: awake, alert , oriented, drowsy and patient cooperative  Airway & Oxygen Therapy: Patient Spontanous Breathing and Patient connected to face mask oxygen  Post-op Assessment: Report given to RN and Post -op Vital signs reviewed and stable  Post vital signs: Reviewed and stable  Last Vitals:  Vitals Value Taken Time  BP 106/58 09/09/20 1106  Temp    Pulse 109 09/09/20 1106  Resp    SpO2 99 % 09/09/20 1106  Vitals shown include unvalidated device data.  Last Pain:  Vitals:   09/09/20 0641  TempSrc: Oral  PainSc: 0-No pain      Patients Stated Pain Goal: 5 ([id number])  Complications: No notable events documented.

## 2020-09-09 NOTE — Op Note (Signed)
Preoperative diagnosis: 1. Clinical stage II right breast cancer status post primary chemotherapy 2.  BRCA2 mutation Postoperative diagnosis: Same as above Procedure: 1.  Left risk reducing nipple sparing mastectomy 2.  Right nipple sparing mastectomy 3.  Right deep axillary sentinel lymph node biopsy 4.  Injection of tracer for sentinel lymph node identification Surgeon: Dr. Serita Grammes Assistant: Dr. Irene Limbo Estimated blood loss: 50 cc Specimens: 1.  Left breast tissue marked short stitch superior, long stitch lateral, double marks nipple areola 2.  Left nipple biopsy 3.  Right breast tissue marked short superior, long stitch lateral, double marks nipple areola 4.  Right nipple biopsy 5.  Right deep axillary sentinel lymph node biopsy with highest counts of 4650 Complications: None Drains: Per plastic surgery Dispo: Case turned over to plastic surgery reconstruction  Indications: This is a 40 year old female who was undergoing a screening mammogram due to her family history and was found to have a 2 x 2 centimeter area of calcifications.  There was a larger area by MRI.  Biopsy was a grade 2 invasive ductal carcinoma that was ER positive at 5%, PR negative, HER2 negative.  She had negative nodes at the beginning.  We discussed primary chemotherapy.  She was found to have a BRCA2 mutation.  She is undergone chemotherapy and her final MRI shows a complete radiologic response.  We elected to proceed with bilateral nipple sparing mastectomies and an axillary sentinel node biopsy on her cancer side.  Procedure: After informed consent was obtained the patient first underwent a pectoral block.  She also was injected with Lymphoseek.  This was done in the standard periareolar fashion.  She was given antibiotics.  SCDs were in place.  She was placed under general anesthesia without complication.  She was prepped and draped in the standard sterile surgical fashion.  A surgical timeout was  then performed.  I prepped the area around the areola.  I then injected 2 cc of mag trace in the subareolar position without difficulty.  I did the left risk reducing side first.  I made a 8 cm incision about 8 cm from the xiphoid and the inframammary fold.  I developed a flap posteriorly to include the pectoralis fascia.  I developed this to the parasternal region, clavicle, and latissimus laterally.  I then developed the anterior flap removing the breast tissue from the skin using a combination of cautery as well as the facelift scissors.  The breast tissue was then removed.  I marked this as above.  I then remove the entirety of the nipple after inverting it.  This was sent as a second specimen.  I obtained hemostasis.  I placed a sponge in this and moved to the other side.  I then did the axillary sentinel node biopsy.  I was able to identify both the Lymphoseek and the mag trace in the axilla.  I made a low axillary incision.  I carried this through the axillary fascia.  I then identified what appeared to be at least 2 possibly 3 lymph nodes that were positive with the mag trace and were discolored as well.  The counts were 2500.  There was no real background counts.  This correlated with the Lymphoseek where the counts were about 200 and there was no background radioactivity either.  There were no other palpable nodes.  Eventually I then closed this after obtaining hemostasis with 2-0 Vicryl, 3-0 Vicryl, and 4-0 Monocryl.  I then did the right nipple sparing mastectomy.  I made a similar incision.  I developed flaps in a similar fashion both anteriorly and posteriorly.  I remove this breast tissue just as the other side.  I then inverted the nipple and removed all remaining tissue that was there and sent this as a separate specimen.  Hemostasis was then obtained.  The case was then turned over to plastic surgery.

## 2020-09-09 NOTE — Progress Notes (Signed)
Assisted Dr. Jillyn Hidden with right and left, ultrasound guided, pectoralis blocks. Side rails up, monitors on throughout procedure. See vital signs in flow sheet. Tolerated Procedure well.

## 2020-09-09 NOTE — Anesthesia Procedure Notes (Addendum)
Anesthesia Regional Block: Pectoralis block   Pre-Anesthetic Checklist: , timeout performed,  Correct Patient, Correct Site, Correct Laterality,  Correct Procedure, Correct Position, site marked,  Risks and benefits discussed,  Surgical consent,  Pre-op evaluation,  At surgeon's request and post-op pain management  Laterality: Left and N/A  Prep: chloraprep       Needles:  Injection technique: Single-shot  Needle Type: Echogenic Stimulator Needle     Needle Length: 9cm  Needle Gauge: 20   Needle insertion depth: 1 cm   Additional Needles:   Procedures:,,,, ultrasound used (permanent image in chart),,    Narrative:  Start time: 09/09/2020 7:15 AM End time: 09/09/2020 7:22 AM Injection made incrementally with aspirations every 5 mL.  Performed by: Personally  Anesthesiologist: Lyn Hollingshead, MD

## 2020-09-09 NOTE — H&P (Signed)
Subjective:     Patient ID: Melis Trochez is a 40 y.o. female.   HPI   Patient presents for bilateral mastectomies with reconstruction.   Presented with palpable right breast mass. Diagnostic MMG/US showed right breast 2 cm span calcifications without mass. Biopsy showed IDC with DCIS, ER 5% weak, PR-, Her2 -.   MRI showed right UOQ mass 4.3 x 2.8 x 2.1 cm.  No abnormal appearing LN.   Patient had vagal response, fainted during biopsy and had to return for clip placement. Again had vasovagal response. Clip placed but additional biopsies to determine extent deferred.    Completed neoadjuvant chemotherapy. Final MRI with complete resolution.   Genetics BRCA2. Mother with breast ca, had lumpectomy.   Current 32 B, happy with this. Wt stable   Works from home in [employer]. Husband is Marketing executive for [employer]. Two kids   Review of Systems      Objective:   Physical Exam Cardiovascular:     Rate and Rhythm: Normal rate and regular rhythm.     Heart sounds: Normal heart sounds.  Pulmonary:     Effort: Pulmonary effort is normal.     Breath sounds: Normal breath sounds.  Abdominal:     Comments: Small volume for reconstruction  Skin:    Comments: Fitzpatrick 2     No ptosis bilateral Sn to nipple R 18 L 19 cm BW R 14.5  L 14.5  CW 11 cm Nipple to IMF R 6 L 6 cm    Assessment:     Right breast cancer UOQ ER+  BRCA2 Neoadjuvant chemotherapy    Plan:       Patient has elected for bilateral mastectomies. Plan bilateral NSM with breast reconstruction with TE acellular dermis.   Reviewed drains, OR length, hospital stay and recovery, limitations. Discussed process of expansion and implant based risks including rupture, imaging surveillance for silicone implants, infection requiring surgery or removal, contracture.    Discussed use of acellular dermis in reconstruction, cadaveric source, incorporation over several weeks, risk that if has seroma or infection can  act as additional nidus for infection if not incorporated.    Discussed prepectoral vs sub pectoral reconstruction. Discussed with patient and benefit of this is no animation deformity, may be less pain. Risk may be more visible rippling over upper poles, greater need of ADM. Reviewed pre pectoral would require larger amount acellular dermis, more drains. Discussed any type reconstruction also risks long term displacement implant and visible rippling. If prepectoral counseled I would recommend she be comfortable with silicone implants as more options that have less rippling. She agrees to prepectoral placement.   Reviewed SSM vs NSM, both will be asensate and not stimulate. Reviewed with both risks mastectomy flap necrosis requiring additional surgery. Additional risks including but not limited to bleeding hematoma seroma need for additional procedures damage to adjacent structures asymmetry unacceptable cosmetic result blood clots in legs or lungs reviewed.    Drain teaching completed. Will give Rx at time of surgery. Has Rx for [facility] [place].

## 2020-09-09 NOTE — Anesthesia Procedure Notes (Addendum)
  Anesthesia Regional Block: Pectoralis block   Pre-Anesthetic Checklist: , timeout performed,  Correct Patient, Correct Site, Correct Laterality,  Correct Procedure, Correct Position, site marked,  Risks and benefits discussed,  Surgical consent,  Pre-op evaluation,  At surgeon's request and post-op pain management  Laterality: Right and N/A  Prep: chloraprep       Needles:  Injection technique: Single-shot  Needle Type: Echogenic Stimulator Needle     Needle Length: 9cm  Needle Gauge: 20   Needle insertion depth: 2 cm   Additional Needles:   Procedures:,,,, ultrasound used (permanent image in chart),,    Narrative:  Start time: 09/09/2020 7:24 AM End time: 09/09/2020 7:31 AM Injection made incrementally with aspirations every 5 mL.  Performed by: Personally  Anesthesiologist: Lyn Hollingshead, MD

## 2020-09-10 ENCOUNTER — Encounter (HOSPITAL_BASED_OUTPATIENT_CLINIC_OR_DEPARTMENT_OTHER): Payer: Self-pay | Admitting: General Surgery

## 2020-09-10 DIAGNOSIS — C50212 Malignant neoplasm of upper-inner quadrant of left female breast: Secondary | ICD-10-CM | POA: Diagnosis not present

## 2020-09-10 DIAGNOSIS — Z170 Estrogen receptor positive status [ER+]: Secondary | ICD-10-CM | POA: Diagnosis not present

## 2020-09-10 NOTE — Discharge Instructions (Addendum)
*  You had 1000 mg of Tylenol at 7:45 am    About my Jackson-Pratt Bulb Drain  What is a Jackson-Pratt bulb? A Jackson-Pratt is a soft, round device used to collect drainage. It is connected to a long, thin drainage catheter, which is held in place by one or two small stiches near your surgical incision site. When the bulb is squeezed, it forms a vacuum, forcing the drainage to empty into the bulb.  Emptying the Jackson-Pratt bulb- To empty the bulb: 1. Release the plug on the top of the bulb. 2. Pour the bulb's contents into a measuring container which your nurse will provide. 3. Record the time emptied and amount of drainage. Empty the drain(s) as often as your     doctor or nurse recommends.  Date                  Time                    Amount (Drain 1)                 Amount (Drain 2)  _______________________________________________________________________________  _______________________________________________________________________________  _______________________________________________________________________________  _______________________________________________________________________________  _______________________________________________________________________________  _______________________________________________________________________________  _______________________________________________________________________________  _______________________________________________________________________________   Date                  Time                    Amount (Drain 1)                 Amount (Drain  2)  ___________________________________________________________________________________  ___________________________________________________________________________________  ___________________________________________________________________________________  ___________________________________________________________________________________  ___________________________________________________________________________________  ___________________________________________________________________________________  ___________________________________________________________________________________  ___________________________________________________________________________________  Squeezing the Jackson-Pratt Bulb- To squeeze the bulb: 1. Make sure the plug at the top of the bulb is open. 2. Squeeze the bulb tightly in your fist. You will hear air squeezing from the bulb. 3. Replace the plug while the bulb is squeezed. 4. Use a safety pin to attach the bulb to your clothing. This will keep the catheter from     pulling at the bulb insertion site.  When to call your doctor- Call your doctor if: Drain site becomes red, swollen or hot. You have a fever greater than 101 degrees F. There is oozing at the drain site. Drain falls out (apply a guaze bandage over the drain hole and secure it with tape). Drainage increases daily not related to activity patterns. (You will usually have more drainage when you are active than when you are resting.) Drainage has a bad odor.

## 2020-09-10 NOTE — Discharge Summary (Signed)
Physician Discharge Summary  Patient ID: Sydney Herman MRN: [id number] DOB/AGE: Apr 16, 1980 40 y.o.  Admit date: 09/09/2020 Discharge date: 09/10/2020  Admission Diagnoses: Right breast cancer UOQ ER+ 2.BRCA2  Discharge Diagnoses:  same  Discharged Condition: stable  Hospital Course: Post operatively patient did well tolerating diet, pain controlled with oral medication and ambulatory with minimal assist. Instructed on bathing and drain care.  Treatments: surgery:Bilateral nipple sparing mastectomies right sentinel node bilateral breast reconstruction with tissue expanders acellular dermis 8.31.22  Discharge Exam: Blood pressure 102/71, pulse 74, temperature 98.6 F (37 C), resp. rate 16, height 5' 1" (1.549 m), weight 55.5 kg, SpO2 100 %. Incision/Wound: chest soft bilateral Tegaderms in place incisions dry drains serosanguinous  Disposition: Discharge disposition: 01-Home or Self Care       Discharge Instructions     Call MD for:  redness, tenderness, or signs of infection (pain, swelling, bleeding, redness, odor or green/yellow discharge around incision site)   Complete by: As directed    Call MD for:  temperature >100.5   Complete by: As directed    Discharge instructions   Complete by: As directed    Ok to remove dressings and shower am 9.2.22. Soap and water ok, pat Tegaderms dry. Do not remove Tegaderms. No creams or ointments over incisions. Do not let drains dangle in shower, attach to lanyard or similar.Strip and record drains twice daily and bring log to clinic visit.  Breast binder or soft compression bra all other times.  Ok to raise arms above shoulders for bathing and dressing.  No house yard work or exercise until cleared by MD.   Recommend ibuprofen with meals to aid with pain control. Also ok to use Tylenol for pain. Recommend Miralax or Dulcolax as needed for constipation.   Driving Restrictions   Complete by: As directed    No driving for 2 weeks then  no driving if taking narcotics   Lifting restrictions   Complete by: As directed    No lifting > 5-10 lbs until cleared by MD   Resume previous diet   Complete by: As directed       Allergies as of 09/10/2020       Reactions   Amoxil [amoxicillin] Rash        Medication List     TAKE these medications    escitalopram 10 MG tablet Commonly known as: LEXAPRO Take 1 tablet (10 mg total) by mouth daily.   lidocaine-prilocaine cream Commonly known as: EMLA APPLY TO AFFECTED AREA ONCE   loratadine 10 MG tablet Commonly known as: CLARITIN 10 mg.   methocarbamol 500 MG tablet Commonly known as: Robaxin Take 1 tablet (500 mg total) by mouth every 8 (eight) hours as needed for muscle spasms.   oxyCODONE 5 MG immediate release tablet Commonly known as: Roxicodone Take 1 tablet (5 mg total) by mouth every 4 (four) hours as needed.   Restasis 0.05 % ophthalmic emulsion Generic drug: cycloSPORINE INSILL 1 DROP INTO BOTH EYES TWICE DAILY   sulfamethoxazole-trimethoprim 800-160 MG tablet Commonly known as: BACTRIM DS Take 1 tablet by mouth 2 (two) times daily.        Follow-up Information     Rolm Bookbinder, MD Follow up in 2 week(s).   Specialty: General Surgery Contact information: [address] [place] [address] [phone]         Irene Limbo, MD Follow up in 1 week(s).   Specialty: Plastic Surgery Contact information: [address] [place] [address] [phone]                 Signed: Irene Limbo 09/10/2020, 8:00 AM

## 2020-09-11 LAB — SURGICAL PATHOLOGY

## 2020-09-15 ENCOUNTER — Encounter: Payer: Self-pay | Admitting: *Deleted

## 2020-09-16 NOTE — Progress Notes (Signed)
Patient Care Team: Emeterio Reeve, DO as PCP - General (Osteopathic Medicine) [name], MD as Consulting Physician (Obstetrics and Gynecology) Mauro Kaufmann, RN as Oncology Nurse Navigator Rockwell Germany, RN as Oncology Nurse Navigator  DIAGNOSIS:    ICD-10-CM   1. Malignant neoplasm of upper-outer quadrant of right breast in female, estrogen receptor positive ([place])  C50.411    Z17.0       SUMMARY OF ONCOLOGIC HISTORY: Oncology History  Malignant neoplasm of upper outer quadrant of female breast ([place])  01/28/2020 Initial Diagnosis   Patient palpated a right breast lump. Diagnostic mammogram and US showed calcifications spanning 2.0cm in the right breast. Biopsy showed invasive and in situ carcinoma, grade 2. ER 5% weak, PR 0% negative, HER2 equiv, Ki 10%     01/28/2020 Cancer Staging   Staging form: Breast, AJCC 8th Edition - Clinical stage from 01/28/2020: Stage IA (cT1c, cN0, cM0, G2, ER+, PR-, HER2: Equivocal) - Signed by Nicholas Lose, MD on 01/29/2020   02/05/2020 - 08/18/2020 Chemotherapy    Patient is on Treatment Plan: BREAST PEMBROLIZUMAB Q21D       02/13/2020 Genetic Testing   Positive genetic testing:  A single, heterozygous, pathogenic variant was detected in the BRCA2 gene called [id number]. Testing was completed through the CustomNext-Cancer + RNAinsight panel offered by [facility]. 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 The report date is 02/13/2020.  The CustomNext-Cancer+RNAinsight panel offered by [facility] includes sequencing and rearrangement analysis for the following 47 genes:  APC, ATM, AXIN2, BARD1, BMPR1A, BRCA1, BRCA2, BRIP1, CDH1, CDK4, CDKN2A, CHEK2, DICER1, EPCAM, GREM1, HOXB13, MEN1, MLH1, MSH2, MSH3, MSH6, MUTYH, NBN, NF1, NF2, NTHL1, PALB2, PMS2, POLD1, POLE, PTEN, RAD51C, RAD51D, RECQL, RET, SDHA, SDHAF2, SDHB, SDHC, SDHD, SMAD4, SMARCA4, STK11,  TP53, TSC1, TSC2, and VHL.  RNA data is routinely analyzed for use in variant interpretation for all genes.   08/25/2020 -  Chemotherapy    Patient is on Treatment Plan: BREAST PEMBROLIZUMAB Q21D         CHIEF COMPLIANT: Cycle 10 Keytruda  INTERVAL HISTORY: Sydney Herman is a 40 y.o. with above-mentioned history of right breast cancer currently on Keytruda. She presents to the clinic today for cycle 10.  She is recovering very well from the recent bilateral mastectomy surgery.  She is currently undergoing breast reconstruction.  She is super excited about the complete pathologic response that we obtained from chemo.  ALLERGIES:  is allergic to amoxil [amoxicillin].  MEDICATIONS:  Current Outpatient Medications  Medication Sig Dispense Refill   cycloSPORINE (RESTASIS) 0.05 % ophthalmic emulsion INSILL 1 DROP INTO BOTH EYES TWICE DAILY 180 mL 4   escitalopram (LEXAPRO) 10 MG tablet Take 1 tablet (10 mg total) by mouth daily. 90 tablet 3   lidocaine-prilocaine (EMLA) cream APPLY TO AFFECTED AREA ONCE 30 g 3   loratadine (CLARITIN) 10 MG tablet 10 mg.     methocarbamol (ROBAXIN) 500 MG tablet Take 1 tablet (500 mg total) by mouth every 8 (eight) hours as needed for muscle spasms. 20 tablet 0   oxyCODONE (ROXICODONE) 5 MG immediate release tablet Take 1 tablet (5 mg total) by mouth every 4 (four) hours as needed. 20 tablet 0   sulfamethoxazole-trimethoprim (BACTRIM DS) 800-160 MG tablet Take 1 tablet by mouth 2 (two) times daily. 12 tablet 0   No current facility-administered medications for this visit.    PHYSICAL EXAMINATION: ECOG PERFORMANCE STATUS: 1 - Symptomatic  but completely ambulatory  Vitals:   09/17/20 1122  BP: 115/72  Pulse: 75  Resp: 18  Temp: (!) 97.2 F (36.2 C)  SpO2: 100%   Filed Weights   09/17/20 1122  Weight: 122 lb 9.6 oz (55.6 kg)      LABORATORY DATA:  I have reviewed the data as listed CMP Latest Ref Rng & Units 08/25/2020 08/18/2020 08/11/2020   Glucose 70 - 99 mg/dL 92 103(H) 94  BUN 6 - 20 mg/dL _0 Creatinine 0.44 - 1.00 mg/dL 0.68 0.68 0.64  Sodium 135 - 145 mmol/L 138 140 136  Potassium 3.5 - 5.1 mmol/L 3.9 3.9 3.8  Chloride 98 - 111 mmol/L 105 105 101  CO2 22 - 32 mmol/L _1 Calcium 8.9 - 10.3 mg/dL 9.3 9.9 9.5  Total Protein 6.5 - 8.1 g/dL 7.3 7.5 7.2  Total Bilirubin 0.3 - 1.2 mg/dL 0.4 <0.2(L) 0.4  Alkaline Phos 38 - 126 U/L 73 83 84  AST 15 - 41 U/L _2 ALT 0 - 44 U/L _3 Lab Results  Component Value Date   WBC 3.9 (L) 08/25/2020   HGB 9.5 (L) 08/25/2020   HCT 27.4 (L) 08/25/2020   MCV 98.9 08/25/2020   PLT 137 (L) 08/25/2020   NEUTROABS 2.4 08/25/2020    ASSESSMENT & PLAN:  Malignant neoplasm of upper outer quadrant of female breast ([place]) 01/28/2020: Palpable right breast lump: Mammogram and ultrasound revealed calcifications spanning 2 cm, biopsy revealed IDC with DCIS, grade 2, 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     Treatment plan: 1. Neo- adjuvant chemotherapy with dose dense Adriamycin and Cytoxan/Keytruda followed by Taxol and carboplatin completed 08/18/2020 2. bilateral mastectomies:09/09/2020: Left mastectomy: Negative for cancer, right mastectomy: Pathologic complete response 0/3 lymph nodes negative 3. Adjuvant antiestrogen therapy  _______________________________________________________________________ 09/09/2020: Left mastectomy: Negative for cancer, right mastectomy: Pathologic complete response 0/3 lymph nodes negative  Pathology counseling: I discussed the final pathology report of the patient provided  a copy of this report. I discussed the margins as well as lymph node surgeries. We also discussed the final staging along with previously performed ER/PR and HER-2/neu  testing.   Antiestrogen therapy: My recommendation is complete ovarian function suppression with aromatase inhibitor therapy.  She plans to do a hysterectomy sometime in the fall.  We will await that surgery to begin antiestrogens.  Return clinic every 3 weeks for [facility] maintenance therapy.  No orders of the defined types were placed in this encounter.  The patient has a good understanding of the overall plan. she agrees with it. she will call with any problems that may develop before the next visit here.  Total time spent: 20 mins including face to face time and time spent for planning, charting and coordination of care  Rulon Eisenmenger, MD, MPH 09/17/2020  I, Thana Ates, am acting as scribe for Dr. Nicholas Lose.  I have reviewed the above documentation for accuracy and completeness, and I agree with the above.

## 2020-09-17 ENCOUNTER — Other Ambulatory Visit: Payer: Self-pay

## 2020-09-17 ENCOUNTER — Inpatient Hospital Stay: Payer: 59 | Attending: Adult Health | Admitting: Hematology and Oncology

## 2020-09-17 DIAGNOSIS — C50411 Malignant neoplasm of upper-outer quadrant of right female breast: Secondary | ICD-10-CM | POA: Insufficient documentation

## 2020-09-17 DIAGNOSIS — Z5112 Encounter for antineoplastic immunotherapy: Secondary | ICD-10-CM | POA: Diagnosis not present

## 2020-09-17 DIAGNOSIS — Z170 Estrogen receptor positive status [ER+]: Secondary | ICD-10-CM | POA: Insufficient documentation

## 2020-09-17 NOTE — Assessment & Plan Note (Signed)
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  Treatment plan: 1. Neo- adjuvant chemotherapy with dose dense Adriamycin and Cytoxan/Keytrudafollowed by Taxol and carboplatin completed 08/18/2020 2.bilateral mastectomies:09/09/2020: Left mastectomy: Negative for cancer, right mastectomy: Pathologic complete response 0/3 lymph nodes negative 3. Adjuvant antiestrogen therapy  _______________________________________________________________________ 09/09/2020: Left mastectomy: Negative for cancer, right mastectomy: Pathologic complete response 0/3 lymph nodes negative  Pathology counseling: I discussed the final pathology report of the patient provided  a copy of this report. I discussed the margins as well as lymph node surgeries. We also discussed the final staging along with previously performed ER/PR and HER-2/neu testing.  Return clinic every 3 weeks for [facility] maintenance therapy. We can start antiestrogen therapy in about a month.

## 2020-09-23 ENCOUNTER — Inpatient Hospital Stay: Payer: 59

## 2020-09-23 ENCOUNTER — Other Ambulatory Visit: Payer: Self-pay

## 2020-09-23 VITALS — BP 100/67 | HR 77 | Temp 97.5°F

## 2020-09-23 DIAGNOSIS — C50411 Malignant neoplasm of upper-outer quadrant of right female breast: Secondary | ICD-10-CM

## 2020-09-23 DIAGNOSIS — Z170 Estrogen receptor positive status [ER+]: Secondary | ICD-10-CM

## 2020-09-23 DIAGNOSIS — Z95828 Presence of other vascular implants and grafts: Secondary | ICD-10-CM

## 2020-09-23 DIAGNOSIS — Z5112 Encounter for antineoplastic immunotherapy: Secondary | ICD-10-CM | POA: Diagnosis not present

## 2020-09-23 LAB — TSH: TSH: 0.562 u[IU]/mL (ref 0.308–3.960)

## 2020-09-23 LAB — PREGNANCY, URINE: Preg Test, Ur: NEGATIVE

## 2020-09-23 MED ORDER — SODIUM CHLORIDE 0.9% FLUSH
10.0000 mL | INTRAVENOUS | Status: DC | PRN
  Administered 2020-09-23: 10 mL

## 2020-09-23 MED ORDER — SODIUM CHLORIDE 0.9 % IV SOLN: Freq: Once | INTRAVENOUS | Status: AC

## 2020-09-23 MED ORDER — SODIUM CHLORIDE 0.9 % IV SOLN
200.0000 mg | Freq: Once | INTRAVENOUS | Status: AC
  Administered 2020-09-23: 200 mg via INTRAVENOUS
  Filled 2020-09-23: qty 8

## 2020-09-23 MED ORDER — HEPARIN SOD (PORK) LOCK FLUSH 100 UNIT/ML IV SOLN
500.0000 [IU] | Freq: Once | INTRAVENOUS | Status: AC | PRN
  Administered 2020-09-23: 500 [IU]

## 2020-09-23 MED ORDER — SODIUM CHLORIDE 0.9% FLUSH
10.0000 mL | Freq: Once | INTRAVENOUS | Status: AC
  Administered 2020-09-23: 10 mL

## 2020-09-23 NOTE — Progress Notes (Signed)
Ok to proceed with previous labs from 8/16 per MD [place].   Larene Beach, PharmD

## 2020-09-23 NOTE — Patient Instructions (Signed)
[facility] MEDICAL [facility]COLOG[facility]   Discharge Instructions: Thank you for choosing [facility] to provide your oncology and hematology care.   If you have a lab appointment with the Cancer Center, please go directly to the Cancer Center and check in at the registration area.   Wear comfortable clothing and clothing appropriate for easy access to any Portacath or PICC line.   We strive to give you quality time with your provider. You may need to reschedule your appointment if you arrive late (15 or more minutes).  Arriving late affects you and other patients whose appointments are after yours.  Also, if you miss three or more appointments without notifying the office, you may be dismissed from the clinic at the provider's discretion.      For prescription refill requests, have your pharmacy contact our office and allow 72 hours for refills to be completed.    Today you received the following chemotherapy and/or immunotherapy agents: Pembrolizumab.      To help prevent nausea and vomiting after your treatment, we encourage you to take your nausea medication as directed.  BELOW ARE SYMPTOMS THAT SHOULD BE REPORTED IMMEDIATELY: *FEVER GREATER THAN 100.4 F (38 C) OR HIGHER *CHILLS OR SWEATING *NAUSEA AND VOMITING THAT IS NOT CONTROLLED WITH YOUR NAUSEA MEDICATION *UNUSUAL SHORTNESS OF BREATH *UNUSUAL BRUISING OR BLEEDING *URINARY PROBLEMS (pain or burning when urinating, or frequent urination) *BOWEL PROBLEMS (unusual diarrhea, constipation, pain near the anus) TENDERNESS IN MOUTH AND THROAT WITH OR WITHOUT PRESENCE OF ULCERS (sore throat, sores in mouth, or a toothache) UNUSUAL RASH, SWELLING OR PAIN  UNUSUAL VAGINAL DISCHARGE OR ITCHING   Items with * indicate a potential emergency and should be followed up as soon as possible or go to the Emergency Department if any problems should occur.  Please show the CHEMOTHERAPY ALERT CARD or IMMUNOTHERAPY ALERT CARD at  check-in to the Emergency Department and triage nurse.  Should you have questions after your visit or need to cancel or reschedule your appointment, please contact [facility]COL[facility]  Dept: [phone]  and follow the prompts.  Office hours are 8:00 a.m. to 4:30 p.m. Monday - Friday. Please note that voicemails left after 4:00 p.m. may not be returned until the following business day.  We are closed weekends and major holidays. You have access to a nurse at all times for urgent questions. Please call the main number to the clinic Dept: [phone] and follow the prompts.   For any non-urgent questions, you may also contact your provider using MyChart. We now offer e-Visits for anyone 18 and older to request care online for non-urgent symptoms. For details visit [email or web address].   Also download the MyChart app! Go to the app store, search "MyChart", open the app, select , and log in with your MyChart username and password.  Due to Covid, a mask is required upon entering the hospital/clinic. If you do not have a mask, one will be given to you upon arrival. For doctor visits, patients may have 1 support person aged 18 or older with them. For treatment visits, patients cannot have anyone with them due to current Covid guidelines and our immunocompromised population.   

## 2020-09-25 ENCOUNTER — Ambulatory Visit: Payer: 59

## 2020-09-28 ENCOUNTER — Encounter: Payer: Self-pay | Admitting: Physical Therapy

## 2020-09-28 ENCOUNTER — Ambulatory Visit: Payer: 59 | Attending: Hematology and Oncology | Admitting: Physical Therapy

## 2020-09-28 ENCOUNTER — Other Ambulatory Visit: Payer: Self-pay

## 2020-09-28 DIAGNOSIS — C50411 Malignant neoplasm of upper-outer quadrant of right female breast: Secondary | ICD-10-CM | POA: Insufficient documentation

## 2020-09-28 DIAGNOSIS — Z170 Estrogen receptor positive status [ER+]: Secondary | ICD-10-CM | POA: Diagnosis not present

## 2020-09-28 DIAGNOSIS — Z483 Aftercare following surgery for neoplasm: Secondary | ICD-10-CM | POA: Diagnosis not present

## 2020-09-28 DIAGNOSIS — M25612 Stiffness of left shoulder, not elsewhere classified: Secondary | ICD-10-CM | POA: Diagnosis not present

## 2020-09-28 DIAGNOSIS — M25611 Stiffness of right shoulder, not elsewhere classified: Secondary | ICD-10-CM | POA: Diagnosis not present

## 2020-09-28 NOTE — Therapy (Signed)
[place], [place], [address] Phone: [phone]   Fax:  [phone]  Physical Therapy Treatment  Patient Details  Name: LAMETRIA KLUNK MRN: [id number] Date of Birth: 05-Jun-1980 Referring Provider (PT): Dr. Nicholas Lose   Encounter Date: 09/28/2020   PT End of Session - 09/28/20 1608     Visit Number 2    Number of Visits 10    Date for PT Re-Evaluation 10/26/20    PT Start Time 1500    PT Stop Time 1602    PT Time Calculation (min) 62 min    Activity Tolerance Patient tolerated treatment well    Behavior During Therapy [facility] for tasks assessed/performed             Past Medical History:  Diagnosis Date   Abdominal wall mass of right lower quadrant 05/17/2013   Saw Dr. Georgette [place] 2015- was thought to be scar tissue- no growth since that time    Breast cancer ([place]) 01/23/2020   Right   Chicken pox    Family history of esophageal cancer    Family history of prostate cancer    Family history of uterine cancer    Generalized headaches    Has had some migraines in past as well. once a week or less. Usually occipital and associated with sinus pressure as well.    Heartburn in pregnancy     Past Surgical History:  Procedure Laterality Date   BREAST RECONSTRUCTION WITH PLACEMENT OF TISSUE EXPANDER AND ALLODERM Bilateral 09/09/2020   Procedure: BILATERAL BREAST RECONSTRUCTION WITH PLACEMENT OF TISSUE EXPANDER AND ALLODERM;  Surgeon: Irene Limbo, MD;  Location: [facility];  Service: Plastics;  Laterality: Bilateral;   CESAREAN SECTION  07/2010   [facility]   CESAREAN SECTION N/A 12/27/2012   womens 2nd    NIPPLE SPARING MASTECTOMY Left 09/09/2020   Procedure: LEFT NIPPLE SPARING MASTECTOMY;  Surgeon: Rolm Bookbinder, MD;  Location: [facility];  Service: General;  Laterality: Left;   NIPPLE SPARING MASTECTOMY WITH SENTINEL LYMPH NODE BIOPSY Right 09/09/2020    Procedure: RIGHT NIPPLE SPARING MASTECTOMY WITH RIGHT AXILLARY SENTINEL LYMPH NODE BIOPSY;  Surgeon: Rolm Bookbinder, MD;  Location: [facility];  Service: General;  Laterality: Right;   PORTACATH PLACEMENT Right 02/04/2020   Procedure: INSERTION PORT-A-CATH WITH ULTRASOUND;  Surgeon: Rolm Bookbinder, MD;  Location: [facility];  Service: General;  Laterality: Right;   WISDOM TOOTH EXTRACTION      There were no vitals filed for this visit.   Subjective Assessment - 09/28/20 1459     Subjective Patient reports she underwent neoadjuvant chemotherapy 02/05/2020 - 08/18/2020 followed by a bilateral mastectomy and right sentinel node biopsy (3 negative nodes) on 09/09/2020. Her drains were removed 09/24/2020. She is meeting with radiation oncology to discuss whether she needs radiation but will have anti-estrogen therapy.    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 She underwent neoadjuvant chemotherapy 02/05/2020 - 08/18/2020 followed by a bilateral mastectomy and right sentinel node biopsy (3 negative nodes) on 09/09/2020. The mass measures 2 cm and is located in the upper outer quadrant with a Ki67 of 10%.    Patient Stated Goals See how my arms are doing    Currently in Pain? Yes    Pain Score 5     Pain Location Axilla    Pain Orientation Right    Pain  Descriptors / Indicators Tightness;Numbness    Pain Type Surgical pain    Pain Onset 1 to 4 weeks ago    Pain Frequency Intermittent    Aggravating Factors  Gets itchy and stings    Pain Relieving Factors Nothing                [facility] PT Assessment - 09/28/20 0001       Assessment   Medical Diagnosis s/p bil mastectomy and right SLNB    Referring Provider (PT) Dr. Nicholas Lose    Onset Date/Surgical Date 09/09/20    Hand Dominance Right    Prior Therapy Baselines      Precautions   Precautions Other (comment)     Precaution Comments right arm lymphedema risk and recent surgery      Restrictions   Weight Bearing Restrictions No      Balance Screen   Has the patient fallen in the past 6 months No    Has the patient had a decrease in activity level because of a fear of falling?  No    Is the patient reluctant to leave their home because of a fear of falling?  No      Home Social worker Private residence    Living Arrangements Spouse/significant other;Children   Husband, 38 and 21 y.o. 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 Slightly tight scar tissue present. Right UE with neural tension.      Posture/Postural Control   Posture/Postural Control No significant limitations      ROM / Strength   AROM / PROM / Strength AROM      AROM   AROM Assessment Site Shoulder    Right/Left Shoulder Right;Left    Right Shoulder Extension 40 Degrees    Right Shoulder Flexion 87 Degrees    Right Shoulder ABduction 79 Degrees    Left Shoulder Extension 57 Degrees    Left Shoulder Flexion 141 Degrees    Left Shoulder ABduction 144 Degrees    Left Shoulder Internal Rotation 70 Degrees    Left Shoulder External Rotation 88 Degrees      Strength   Overall Strength Due to precautions;Unable to assess               LYMPHEDEMA/ONCOLOGY QUESTIONNAIRE - 09/28/20 0001       Type   Cancer Type Right breast cancer      Surgeries   Mastectomy Date 09/09/20    Sentinel Lymph Node Biopsy Date 09/09/20    Number Lymph Nodes Removed 3      Treatment   Active Chemotherapy Treatment No    Past Chemotherapy  Treatment Yes    Date 08/18/20    Active Radiation Treatment No    Past Radiation Treatment No    Current Hormone Treatment No    Past Hormone Therapy No      What other symptoms do you have   Are you Having Heaviness or Tightness Yes    Are you having Pain Yes    Are  you having pitting edema No    Is it Hard or Difficult finding clothes that fit No    Do you have infections No    Is there Decreased scar mobility Yes    [place] Sign No      Lymphedema Assessments   Lymphedema Assessments Upper extremities      Right Upper Extremity Lymphedema   10 cm Proximal to Olecranon Process 25 cm    Olecranon Process 22.1 cm    10 cm Proximal to Ulnar Styloid Process 20.9 cm    Just Proximal to Ulnar Styloid Process 14.2 cm    Across Hand at [employer] 18 cm    At [place] of 2nd Digit 5.8 cm      Left Upper Extremity Lymphedema   10 cm Proximal to Olecranon Process 25.4 cm    Olecranon Process 22.1 cm    10 cm Proximal to Ulnar Styloid Process 20.8 cm    Just Proximal to Ulnar Styloid Process 14.1 cm    Across Hand at [employer] 18 cm    At [place] of 2nd Digit 5.8 cm                Quick Dash - 09/28/20 0001     Open a tight or new jar Moderate difficulty    Do heavy household chores (wash walls, wash floors) Unable    Carry a shopping bag or briefcase Unable    Wash your back Moderate difficulty    Use a knife to cut food Mild difficulty    Recreational activities in which you take some force or impact through your arm, shoulder, or hand (golf, hammering, tennis) Unable    During the past week, to what extent has your arm, shoulder or hand problem interfered with your normal social activities with family, friends, neighbors, or groups? Not at all    During the past week, to what extent has your arm, shoulder or hand problem limited your work or other regular daily activities Modererately    Arm, shoulder, or hand pain. Mild    Tingling (pins and needles) in your arm,  shoulder, or hand Moderate    Difficulty Sleeping Moderate difficulty    DASH Score 54.55 %                             PT Education - 09/28/20 1606     Education Details Aftercare; scar massage; HEP    Person(s) Educated Patient    Methods Explanation;Demonstration;Handout    Comprehension Returned demonstration;Verbalized understanding                 PT Long Term Goals - 09/28/20 1614       PT LONG TERM GOAL #1   Title Patient will demonstrate she has regained full shoulder ROM and function post operatively compared to baselines.    Time 4    Period Weeks    Status On-going    Target Date 10/26/20      PT LONG TERM GOAL #2   Title Patient will increase right shoulder flexion to 150 degrees for increased ease reaching overhead.    Time 4    Period Weeks    Status New    Target Date 10/26/20      PT LONG TERM GOAL #3   Title Patient will increase right shoulder abduction to 160 degrees for ability to obtain radiation positioning.  Time 4    Period Weeks    Status New    Target Date 10/26/20      PT LONG TERM GOAL #4   Title Patient will improve her DASH score to be </= 8 for improved overall UE function.    Time 4    Period Weeks    Status New    Target Date 10/26/20      PT LONG TERM GOAL #5   Title Patient will report good understanding of lymphedema risk reduction practices.    Time 4    Period Weeks    Status New    Target Date 10/26/20                   Plan - 09/28/20 1609     Clinical Impression Statement Patient is recovering well s/p bil mastectomy with right sentinel node biopsy on 09/09/2020. She had expanders placed and 3 negative nodes removed from her right axilla. She had drains removed last week. She has not begun her HEP due to Dr. Para Skeans precautions. She is significantly limited with shoulder ROM with right being much more limited than left. She will benefit from PT to regain shoulder ROM and  function.    PT Frequency 2x / week    PT Duration 4 weeks    PT Treatment/Interventions ADLs/Self Care Home Management;Therapeutic exercise;Patient/family education;Manual techniques;Passive range of motion;Scar mobilization    PT Next Visit Plan Pt to see Dr. Iran Planas 10/01/2020. If cleared by her, begin PROM and AAROM exercises focused more on right shoulder    PT Home Exercise Plan Post op shoulder ROM HEP    Consulted and Agree with Plan of Care Patient             Patient will benefit from skilled therapeutic intervention in order to improve the following deficits and impairments:  Postural dysfunction, Decreased range of motion, Impaired UE functional use, Pain, Decreased knowledge of precautions, Increased fascial restricitons, Decreased strength, Decreased scar mobility  Visit Diagnosis: Malignant neoplasm of upper-outer quadrant of right breast in female, estrogen receptor positive ([place]) - Plan: PT plan of care cert/re-cert  Aftercare following surgery for neoplasm - Plan: PT plan of care cert/re-cert  Stiffness of left shoulder, not elsewhere classified - Plan: PT plan of care cert/re-cert  Stiffness of right shoulder, not elsewhere classified - Plan: PT plan of care cert/re-cert     Problem List Patient Active Problem List   Diagnosis Date Noted   Breast cancer, right ([place]) 09/09/2020   Malignant tumor of breast ([place]) 05/12/2020   Port-A-Cath in place 03/19/2020   BRCA2 gene mutation positive 02/21/2020   Genetic testing 02/13/2020   Family history of prostate cancer    Family history of uterine cancer    Family history of esophageal cancer    Malignant neoplasm of upper outer quadrant of female breast ([place]) 01/28/2020   History of abnormal cervical Pap smear 11/16/2018   Family history of type 2 diabetes mellitus in father 11/16/2018   Family history of breast cancer in mother 11/16/2018   Mild hyperlipidemia 11/02/2017   Vitamin B12 deficiency 02/20/2017    Generalized headaches    LGSIL (low grade squamous intraepithelial lesion) on Pap smear 10/02/2011   Annia Friendly, PT 09/28/20 4:29 PM   [place], [place], [address] Phone: [phone]   Fax:  [phone]  Name: IVONNE FREEBURG MRN: [id number] Date of Birth: 06/11/1980

## 2020-09-28 NOTE — Patient Instructions (Addendum)
            [facility]         [address], [place] [address]         [phone]         Annia Friendly, PT, CLT   After Breast Cancer Class It is recommended you attend the ABC class to be educated on lymphedema risk reduction. This class is free of charge and lasts for 1 hour. It is a 1-time class. We will send you a Webex link (you need to download the app). You are scheduled for October 17th at 11:00.  Scar massage You can begin gently stretching the skin at your incision sites in various directions for a few minutes each day - after Dr. Iran Planas clears you for that. Then apply coconut oil to the incisions to help them heal and decrease their appearance.  Compression garment Continue wearing your Hugger bra until Dr. Iran Planas says it's ok to stop wearing it.  Home exercise Program You can begin doing your exercises as soon as Dr. Iran Planas releases you to do so.  Follow up PT: It is recommended you return every 3 months for the first 3 years following surgery to be assessed on the SOZO machine for an L-Dex score. This helps prevent clinically significant lymphedema in 95% of patients. These follow up screens are 10 minute appointments that you are not billed for. WE ARE SCHEDULED TO MOVE TO [place] October 12, 2020. APPOINTMENTS FOR SOZO SCREENS AFTER 10/12/2020 WILL BE LOCATED AT [facility] AT [address] [place] [address]. Please call us to confirm we have moved if your appointment is scheduled after October 3rd, 2022. The phone number is [phone]. You are scheduled for December 5th at 10:10.   MEDIAN NERVE: Mobilization XI    Stand with right palm flat on wall, fingers back, elbow bent, head tilted away. Sidestep away from wall, straightening elbow. You can twist your trunk to the left away from the wall. Do __1_ set of __5_ repetitions holding 3 seconds per session. Do _2__ sessions per day.  Copyright   VHI. All rights reserved.

## 2020-10-06 ENCOUNTER — Encounter: Payer: Self-pay | Admitting: Physical Therapy

## 2020-10-06 ENCOUNTER — Ambulatory Visit: Payer: 59 | Admitting: Physical Therapy

## 2020-10-06 ENCOUNTER — Other Ambulatory Visit: Payer: Self-pay

## 2020-10-06 DIAGNOSIS — M25612 Stiffness of left shoulder, not elsewhere classified: Secondary | ICD-10-CM | POA: Diagnosis not present

## 2020-10-06 DIAGNOSIS — Z483 Aftercare following surgery for neoplasm: Secondary | ICD-10-CM

## 2020-10-06 DIAGNOSIS — Z1501 Genetic susceptibility to malignant neoplasm of breast: Secondary | ICD-10-CM | POA: Diagnosis not present

## 2020-10-06 DIAGNOSIS — C50411 Malignant neoplasm of upper-outer quadrant of right female breast: Secondary | ICD-10-CM

## 2020-10-06 DIAGNOSIS — Z170 Estrogen receptor positive status [ER+]: Secondary | ICD-10-CM

## 2020-10-06 DIAGNOSIS — Z7689 Persons encountering health services in other specified circumstances: Secondary | ICD-10-CM | POA: Diagnosis not present

## 2020-10-06 DIAGNOSIS — M25611 Stiffness of right shoulder, not elsewhere classified: Secondary | ICD-10-CM

## 2020-10-06 NOTE — Therapy (Signed)
[place], [place], [address] Phone: [phone]   Fax:  [phone]  Physical Therapy Treatment  Patient Details  Name: Sydney Herman MRN: [id number] Date of Birth: 1980/02/11 Referring Provider (PT): Dr. Nicholas Lose   Encounter Date: 10/06/2020   PT End of Session - 10/06/20 1349     Visit Number 3    Number of Visits 10    Date for PT Re-Evaluation 10/26/20    PT Start Time 9702    PT Stop Time 1343    PT Time Calculation (min) 38 min    Activity Tolerance Patient tolerated treatment well    Behavior During Therapy [facility] for tasks assessed/performed             Past Medical History:  Diagnosis Date   Abdominal wall mass of right lower quadrant 05/17/2013   Saw Dr. Georgette [place] 2015- was thought to be scar tissue- no growth since that time    Breast cancer ([place]) 01/23/2020   Right   Chicken pox    Family history of esophageal cancer    Family history of prostate cancer    Family history of uterine cancer    Generalized headaches    Has had some migraines in past as well. once a week or less. Usually occipital and associated with sinus pressure as well.    Heartburn in pregnancy     Past Surgical History:  Procedure Laterality Date   BREAST RECONSTRUCTION WITH PLACEMENT OF TISSUE EXPANDER AND ALLODERM Bilateral 09/09/2020   Procedure: BILATERAL BREAST RECONSTRUCTION WITH PLACEMENT OF TISSUE EXPANDER AND ALLODERM;  Surgeon: Irene Limbo, MD;  Location: [facility];  Service: Plastics;  Laterality: Bilateral;   CESAREAN SECTION  07/2010   [facility]   CESAREAN SECTION N/A 12/27/2012   womens 2nd    NIPPLE SPARING MASTECTOMY Left 09/09/2020   Procedure: LEFT NIPPLE SPARING MASTECTOMY;  Surgeon: Rolm Bookbinder, MD;  Location: [facility];  Service: General;  Laterality: Left;   NIPPLE SPARING MASTECTOMY WITH SENTINEL LYMPH NODE BIOPSY Right 09/09/2020    Procedure: RIGHT NIPPLE SPARING MASTECTOMY WITH RIGHT AXILLARY SENTINEL LYMPH NODE BIOPSY;  Surgeon: Rolm Bookbinder, MD;  Location: [facility];  Service: General;  Laterality: Right;   PORTACATH PLACEMENT Right 02/04/2020   Procedure: INSERTION PORT-A-CATH WITH ULTRASOUND;  Surgeon: Rolm Bookbinder, MD;  Location: [facility];  Service: General;  Laterality: Right;   WISDOM TOOTH EXTRACTION      There were no vitals filed for this visit.   Subjective Assessment - 10/06/20 1306     Subjective I have started doing the exercises. I am making a little progress. I was cleared by my doctor.    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 She underwent neoadjuvant chemotherapy 02/05/2020 - 08/18/2020 followed by a bilateral mastectomy and right sentinel node biopsy (3 negative nodes) on 09/09/2020. The mass measures 2 cm and is located in the upper outer quadrant with a Ki67 of 10%.    Patient Stated Goals See how my arms are doing    Currently in Pain? Yes    Pain Score 3     Pain Location Breast    Pain Orientation Right    Pain Descriptors / Indicators Tender    Pain Type Acute pain    Pain Onset Today    Pain Frequency Intermittent    Aggravating Factors  nothing    Pain Relieving Factors nothing    Effect of Pain on Daily Activities none                               [facility] Adult PT Treatment/Exercise - 10/06/20 0001       Shoulder Exercises: Pulleys   Flexion 2 minutes    ABduction 2 minutes      Shoulder Exercises: Therapy Ball   Flexion Both;10 reps   pt returned therapist demo     Manual Therapy   Manual Therapy Myofascial release;Passive ROM    Myofascial Release to R axilla and lateral trunk in area of cording    Passive ROM to R shoulder in to flexion, abduction and scaption while performing myofascial release to cording in R axilla  extending down to her scar and trunk, briefly to L shoulder in flexion and abduction but no limitations noted                          PT Long Term Goals - 09/28/20 1614       PT LONG TERM GOAL #1   Title Patient will demonstrate she has regained full shoulder ROM and function post operatively compared to baselines.    Time 4    Period Weeks    Status On-going    Target Date 10/26/20      PT LONG TERM GOAL #2   Title Patient will increase right shoulder flexion to 150 degrees for increased ease reaching overhead.    Time 4    Period Weeks    Status New    Target Date 10/26/20      PT LONG TERM GOAL #3   Title Patient will increase right shoulder abduction to 160 degrees for ability to obtain radiation positioning.    Time 4    Period Weeks    Status New    Target Date 10/26/20      PT LONG TERM GOAL #4   Title Patient will improve her DASH score to be </= 8 for improved overall UE function.    Time 4    Period Weeks    Status New    Target Date 10/26/20      PT LONG TERM GOAL #5   Title Patient will report good understanding of lymphedema risk reduction practices.    Time 4    Period Weeks    Status New    Target Date 10/26/20                   Plan - 10/06/20 1350     Clinical Impression Statement Began AAROM exercises with pt today including pulleys and ball up wall. Pt did well but did feel increased pulling in R axilla that extended down in to her lateral breast. Therapist was able to palpate a cord extending in this area and began myofascial release to decrease tightness. Stopped session early due to L shoulder beginning to get sore as pt just started being able to do post op exercies. Instructed her in dowel exercises for AAROM and added these to her HEP.    PT Frequency 2x / week    PT Duration 4 weeks    PT Treatment/Interventions ADLs/Self Care Home Management;Therapeutic exercise;Patient/family education;Manual techniques;Passive  range of motion;Scar mobilization    PT Next Visit Plan continue PROM and AAROM exercises focused more on right  shoulder, MFR R axillary cording    PT Home Exercise Plan Post op shoulder ROM HEP, supine dowel shoulder    Consulted and Agree with Plan of Care Patient             Patient will benefit from skilled therapeutic intervention in order to improve the following deficits and impairments:  Postural dysfunction, Decreased range of motion, Impaired UE functional use, Pain, Decreased knowledge of precautions, Increased fascial restricitons, Decreased strength, Decreased scar mobility  Visit Diagnosis: Stiffness of right shoulder, not elsewhere classified  Stiffness of left shoulder, not elsewhere classified  Aftercare following surgery for neoplasm  Malignant neoplasm of upper-outer quadrant of right breast in female, estrogen receptor positive ([place])     Problem List Patient Active Problem List   Diagnosis Date Noted   Breast cancer, right ([place]) 09/09/2020   Malignant tumor of breast ([place]) 05/12/2020   Port-A-Cath in place 03/19/2020   BRCA2 gene mutation positive 02/21/2020   Genetic testing 02/13/2020   Family history of prostate cancer    Family history of uterine cancer    Family history of esophageal cancer    Malignant neoplasm of upper outer quadrant of female breast ([place]) 01/28/2020   History of abnormal cervical Pap smear 11/16/2018   Family history of type 2 diabetes mellitus in father 11/16/2018   Family history of breast cancer in mother 11/16/2018   Mild hyperlipidemia 11/02/2017   Vitamin B12 deficiency 02/20/2017   Generalized headaches    LGSIL (low grade squamous intraepithelial lesion) on Pap smear 10/02/2011    Manus Gunning, PT 10/06/2020, 1:54 PM  [place], [place], [address] Phone: [phone]   Fax:  [phone]  Name: Sydney Herman MRN:  [id number] Date of Birth: 1980-09-08  Manus Gunning, PT 10/06/20 1:54 PM

## 2020-10-07 ENCOUNTER — Ambulatory Visit: Payer: 59 | Admitting: Radiation Oncology

## 2020-10-07 ENCOUNTER — Ambulatory Visit
Admission: RE | Admit: 2020-10-07 | Discharge: 2020-10-07 | Disposition: A | Discharge status: Home / Self Care | Payer: 59 | Source: Ambulatory Visit | Attending: Radiation Oncology | Admitting: Radiation Oncology

## 2020-10-07 ENCOUNTER — Encounter: Payer: Self-pay | Admitting: Radiation Oncology

## 2020-10-07 ENCOUNTER — Ambulatory Visit
Admission: RE | Admit: 2020-10-07 | Discharge: 2020-10-07 | Disposition: A | Discharge status: Home / Self Care | Payer: 59 | Source: Ambulatory Visit | Attending: Hematology and Oncology | Admitting: Hematology and Oncology

## 2020-10-07 ENCOUNTER — Other Ambulatory Visit: Payer: Self-pay

## 2020-10-07 VITALS — BP 106/82 | HR 69 | Temp 98.7°F | Resp 20 | Ht 61.0 in | Wt 122.0 lb

## 2020-10-07 DIAGNOSIS — Z170 Estrogen receptor positive status [ER+]: Secondary | ICD-10-CM

## 2020-10-07 DIAGNOSIS — C50411 Malignant neoplasm of upper-outer quadrant of right female breast: Secondary | ICD-10-CM | POA: Diagnosis not present

## 2020-10-07 DIAGNOSIS — Z79899 Other long term (current) drug therapy: Secondary | ICD-10-CM | POA: Diagnosis not present

## 2020-10-07 DIAGNOSIS — Z803 Family history of malignant neoplasm of breast: Secondary | ICD-10-CM | POA: Diagnosis not present

## 2020-10-07 DIAGNOSIS — Z800 Family history of malignant neoplasm of digestive organs: Secondary | ICD-10-CM | POA: Diagnosis not present

## 2020-10-07 DIAGNOSIS — Z1501 Genetic susceptibility to malignant neoplasm of breast: Secondary | ICD-10-CM | POA: Insufficient documentation

## 2020-10-07 DIAGNOSIS — Z8041 Family history of malignant neoplasm of ovary: Secondary | ICD-10-CM | POA: Insufficient documentation

## 2020-10-07 NOTE — Progress Notes (Signed)
Patient states RT axilla pain 1/10, mild fatigue, breast numbness, and limited range of motion RT arm/ full range of motion LT arm. No other symptoms reported at this time.  Meaningful use complete.  BP 106/82 (BP Location: Left Arm, Patient Position: Sitting, Cuff Size: Normal)   Pulse 69   Temp 98.7 F (37.1 C) (Oral)   Resp 20   Ht 5\' 1"  (1.549 m)   Wt 122 lb (55.3 kg)   SpO2 100%   BMI 23.05 kg/m

## 2020-10-07 NOTE — Progress Notes (Signed)
Radiation Oncology         [phone] ________________________________  Name: Sydney Herman        MRN: [id number]  Date of Service: 10/07/2020 DOB: December 06, 1980   REFERRING PHYSICIAN: Nicholas Lose, MD   DIAGNOSIS: The encounter diagnosis was Malignant neoplasm of upper-outer quadrant of right breast in female, estrogen receptor positive ([place]).   HISTORY OF PRESENT ILLNESS: Sydney Herman is a 40 y.o. female with a right breast cancer. The patient was noted to have a palpable mass in the right breast. She has had normal mammograms previously given her mother's history of breast cancer. However her symptoms prompted diagnostic work up with mammogram on 01/15/20 showed a 2 x 2 cm group of calcifications in the upper outer quadrant of the right breast. There was no discrete mass, but possible distortion of the breast was seen on mammographic imaging but no ultrasound correlate was noted. Her right axilla was negative for adenopathy. 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 Clips could not be placed at the time as a syncopal episode aborted placement. Her prognostic panel showed ER was 5% weak staining, PR negative, HER2 equivocal by IHC, FISH was negative, and her Ki 67 was 10%.   Since her last visit she underwent an MRI of the breasts on 02/03/2020 and in the upper outer quadrant the tumor measured 4.3 cm in greatest dimension and no evidence of adenopathy was identified or left breast disease.  She received neoadjuvant chemotherapy and immunotherapy between 02/05/2020.  She completed this with a dose reduction in Taxol on 08/18/2020.  She has been receiving single agent Keytruda every 3 weeks since 08/25/2020.  She underwent repeat MRI scan on 08/20/2020 showing interval resolution of previously demonstrated enhancing mass within the right breast and no evidence of adenopathy or left breast  disease was seen.  She underwent bilateral nipple sparing mastectomies with right sentinel lymph node biopsy on 09/09/2020, final pathology showed a complete pathologic response to treatment with no residual disease in the breast specimen, her right nipple biopsy was also negative for residual carcinoma and 3 sentinel lymph nodes were negative for disease.  Her left nipple showed mild fibrocystic change with usual ductal hyperplasia and calcifications and her left nipple biopsy showed focal atypical lobular hyperplasia.  At the time of her mastectomy surgery she underwent tissue expander placement with Dr. Iran Planas she is completed her expansion and is seen to discuss the possibility of adjuvant radiotherapy as a part of her treatment plan.   PREVIOUS RADIATION THERAPY: No   PAST MEDICAL HISTORY:  Past Medical History:  Diagnosis Date   Abdominal wall mass of right lower quadrant 05/17/2013   Saw Dr. Georgette Dover 2015- was thought to be scar tissue- no growth since that time    Breast cancer ([place]) 01/23/2020   Right   Chicken pox    Family history of esophageal cancer    Family history of prostate cancer    Family history of uterine cancer    Generalized headaches    Has had some migraines in past as well. once a week or less. Usually occipital and associated with sinus pressure as well.    Heartburn in pregnancy        PAST SURGICAL HISTORY: Past Surgical History:  Procedure Laterality Date   BREAST RECONSTRUCTION WITH PLACEMENT OF TISSUE EXPANDER AND ALLODERM Bilateral 09/09/2020   Procedure: BILATERAL BREAST RECONSTRUCTION  WITH PLACEMENT OF TISSUE EXPANDER AND ALLODERM;  Surgeon: Irene Limbo, MD;  Location: [facility];  Service: Plastics;  Laterality: Bilateral;   CES[facility]AN SECTION  07/2010   [facility]   CESAREAN SECTION N/A 12/27/2012   womens 2nd    NIPPLE SPARING MASTECTOMY Left 09/09/2020   Procedure: LEFT NIPPLE SPARING MASTECTOMY;  Surgeon: Rolm Bookbinder, MD;   Location: [facility];  Service: General;  Laterality: Left;   NIPPLE SPARING MASTECTOMY WITH SENTINEL LYMPH NODE BIOPSY Right 09/09/2020   Procedure: RIGHT NIPPLE SPARING MASTECTOMY WITH RIGHT AXILLARY SENTINEL LYMPH NODE BIOPSY;  Surgeon: Rolm Bookbinder, MD;  Location: [facility];  Service: General;  Laterality: Right;   PORTACATH PLACEMENT Right 02/04/2020   Procedure: INSERTION PORT-A-CATH WITH ULTRASOUND;  Surgeon: Rolm Bookbinder, MD;  Location: [facility];  Service: General;  Laterality: Right;   WISDOM TOOTH EXTRACTION       FAMILY HISTORY:  Family History  Problem Relation Age of Onset   Hypertension Mother    Breast cancer Mother 56       early 19's   Diabetes Father    Atrial fibrillation Father    Other Father        biopsies on kidney- noncancerous. stated potentially precancerous   Prostate cancer Maternal Grandfather 90   Endometrial cancer Paternal Grandmother 10   Esophageal cancer Paternal Uncle        dx early 14s     SOCIAL HISTORY:  reports that she has never smoked. She has never used smokeless tobacco. She reports that she does not currently use alcohol after a past usage of about 2.0 - 3.0 standard drinks per week. She reports that she does not use drugs. The patient is married and lives in [place]. She has a background in Engineer, maintenance. She has two young children. Her husband is a Engineer, technical sales in our radiation oncology department.    ALLERGIES: Amoxil [amoxicillin]   MEDICATIONS:  Current Outpatient Medications  Medication Sig Dispense Refill   cycloSPORINE (RESTASIS) 0.05 % ophthalmic emulsion INSILL 1 DROP INTO BOTH EYES TWICE DAILY 180 mL 4   escitalopram (LEXAPRO) 10 MG tablet Take 1 tablet (10 mg total) by mouth daily. 90 tablet 3   lidocaine-prilocaine (EMLA) cream APPLY TO AFFECTED AREA ONCE 30 g 3   loratadine (CLARITIN) 10 MG tablet 10 mg.     methocarbamol  (ROBAXIN) 500 MG tablet Take 1 tablet (500 mg total) by mouth every 8 (eight) hours as needed for muscle spasms. 20 tablet 0   oxyCODONE (ROXICODONE) 5 MG immediate release tablet Take 1 tablet (5 mg total) by mouth every 4 (four) hours as needed. 20 tablet 0   sulfamethoxazole-trimethoprim (BACTRIM DS) 800-160 MG tablet Take 1 tablet by mouth 2 (two) times daily. 12 tablet 0   No current facility-administered medications for this encounter.     REVIEW OF SYSTEMS: On review of systems, the patient reports that she is doing very well despite some fatigue given all of her recent treatments. She has felt some soreness in her chest wall along her periareolar area with some bruising, and some limited range of motion on the right side compared to the left. She reports some cording of her right axilla and is working closely with PT to manage this. No other complaints are verbalized.      PHYSICAL EXAM:  Wt Readings from Last 3 Encounters:  09/17/20 122 lb 9.6 oz (55.6 kg)  09/09/20 122 lb 5.7 oz (55.5 kg)  08/18/20 125 lb 12.8 oz (57.1 kg)   Temp Readings from Last 3 Encounters:  09/23/20 (!) 97.5 F (36.4 C) (Oral)  09/17/20 (!) 97.2 F (36.2 C) (Temporal)  09/10/20 98.6 F (37 C)   BP Readings from Last 3 Encounters:  09/23/20 100/67  09/17/20 115/72  09/10/20 102/71   Pulse Readings from Last 3 Encounters:  09/23/20 77  09/17/20 75  09/10/20 74    In general this is a well appearing caucasian female in no acute distress. She's alert and oriented x4 and appropriate throughout the examination. Cardiopulmonary assessment is negative for acute distress and she exhibits normal effort. The right chest wall has a well healed inframammary incision from prior mastectomy and an intact tissue expander. Similar changes are noted of the left chest wall. There is a small area of granulation along the left periareolar area consistent with prior scabbing. There is mild ecchymoses bilaterally. The  right axilla does have cording that extends from the axilla to the middle of the upper arm and into the lateral chest wall.    ECOG = 0  0 - Asymptomatic (Fully active, able to carry on all predisease activities without restriction)  1 - Symptomatic but completely ambulatory (Restricted in physically strenuous activity but ambulatory and able to carry out work of a light or sedentary nature. For example, light housework, office work)  2 - Symptomatic, <50% in bed during the day (Ambulatory and capable of all self care but unable to carry out any work activities. Up and about more than 50% of waking hours)  3 - Symptomatic, >50% in bed, but not bedbound (Capable of only limited self-care, confined to bed or chair 50% or more of waking hours)  4 - Bedbound (Completely disabled. Cannot carry on any self-care. Totally confined to bed or chair)  5 - Death   Eustace Pen MM, Creech RH, Tormey DC, et al. [phone]). "Toxicity and response criteria of the [facility]". [place] Oncol. 5 (6): 649-55    LABORATORY DATA:  Lab Results  Component Value Date   WBC 3.9 (L) 08/25/2020   HGB 9.5 (L) 08/25/2020   HCT 27.4 (L) 08/25/2020   MCV 98.9 08/25/2020   PLT 137 (L) 08/25/2020   Lab Results  Component Value Date   NA 138 08/25/2020   K 3.9 08/25/2020   CL 105 08/25/2020   CO2 25 08/25/2020   Lab Results  Component Value Date   ALT 28 08/25/2020   AST 27 08/25/2020   ALKPHOS 73 08/25/2020   BILITOT 0.4 08/25/2020      RADIOGRAPHY: NM Sentinel Node Inj-No Rpt (Breast)  Result Date: 09/09/2020 Sulfur Colloid was injected by the Nuclear Medicine Technologist for sentinel lymph node localization.        IMPRESSION/PLAN: 1. 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 Dr. Lisbeth Renshaw discusses the patient's  course to date reviewing preneoadjuvant imaging with MRI, as well as post treatment imaging and her surgical pathology. While her clinical diagnosis by T staging doesn't fit into a typical picture of postmastectomy radiation indications, Dr. Lisbeth Renshaw does discuss the that there are considerations for post mastectomy radiation for cT2N0 triple negative or other high grade breast disease on a case by case basis. The literature however is limited  in this specific scenario, and for this  reason adjuvant radiation is not an absolute recommendation.  Dr. Lisbeth Renshaw does discuss however that it would be an option if she desires more aggressive options of local control. We discussed the risks, benefits, short, and long term effects of radiotherapy, as well as the curative intent, as well as logistics of a 6 1/2 week course of radiotherapy to the right chest wall and regional nodes. After reviewing the long term risks including lymphedema and risks of tissue fibrosis, as well as the lack of definitive guidance, the patient opts to forgo radiation. She will continue consolidative immunotherapy and will discuss long term follow up approach with Dr. Lindi Adie. She will also see Dr. Iran Planas next week to discuss postop care and next steps of implant exchange.  2. BRCA2 Positive. The patient is going to pursue robotic hysterectomy/BSO with Dr. Polly Cobia in the next few months. This will be followed expectantly. She also reports recent evaluation with OB/GYN and had normal CA-125 and negative transvaginal ultrasound evaluation.  3. Postoperative cording and limited RUE range of motion. She continues to be followed closely by PT for manual release of her cording, and continues her ROM exercises at home.   In a visit lasting 45 minutes, greater than 50% of the time was spent face to face discussing the patient's condition, in preparation for the discussion, and coordinating the patient's care.    The above documentation reflects my  direct findings during this shared patient visit. Please see the separate note by Dr. Lisbeth Renshaw on this date for the remainder of the patient's plan of care.    Carola Rhine, PAC

## 2020-10-08 ENCOUNTER — Ambulatory Visit: Payer: 59 | Admitting: Physical Therapy

## 2020-10-08 ENCOUNTER — Encounter: Payer: Self-pay | Admitting: Physical Therapy

## 2020-10-08 ENCOUNTER — Other Ambulatory Visit: Payer: Self-pay

## 2020-10-08 DIAGNOSIS — Z170 Estrogen receptor positive status [ER+]: Secondary | ICD-10-CM

## 2020-10-08 DIAGNOSIS — M25611 Stiffness of right shoulder, not elsewhere classified: Secondary | ICD-10-CM

## 2020-10-08 DIAGNOSIS — C50411 Malignant neoplasm of upper-outer quadrant of right female breast: Secondary | ICD-10-CM | POA: Diagnosis not present

## 2020-10-08 DIAGNOSIS — Z483 Aftercare following surgery for neoplasm: Secondary | ICD-10-CM

## 2020-10-08 DIAGNOSIS — M25612 Stiffness of left shoulder, not elsewhere classified: Secondary | ICD-10-CM | POA: Diagnosis not present

## 2020-10-08 NOTE — Therapy (Signed)
[facility] [address] [place], [place], [address] Phone: [phone]   Fax:  [phone]  Physical Therapy Treatment  Patient Details  Name: Sydney Herman MRN: [id number] Date of Birth: 05/29/1980 Referring Provider (PT): Dr. Vinay Gudena   Encounter Date: 10/08/2020   PT End of Session - 10/08/20 1348     Visit Number 4    Number of Visits 10    Date for PT Re-Evaluation 10/26/20    PT Start Time 1304    PT Stop Time 1345    PT Time Calculation (min) 41 min    Activity Tolerance Patient tolerated treatment well    Behavior During Therapy WFL for tasks assessed/performed             Past Medical History:  Diagnosis Date   Abdominal wall mass of right lower quadrant 05/17/2013   Saw Dr. Tsuei 2015- was thought to be scar tissue- no growth since that time    Breast cancer ([facility]) 01/23/2020   Right   Chicken pox    Family history of esophageal cancer    Family history of prostate cancer    Family history of uterine cancer    Generalized headaches    Has had some migraines in past as well. once a week or less. Usually occipital and associated with sinus pressure as well.    Heartburn in pregnancy     Past Surgical History:  Procedure Laterality Date   BREAST RECONSTRUCTION WITH PLACEMENT OF TISSUE EXPANDER AND ALLODERM Bilateral 09/09/2020   Procedure: BILATERAL BREAST RECONSTRUCTION WITH PLACEMENT OF TISSUE EXPANDER AND ALLODERM;  Surgeon: Thimmappa, Brinda, MD;  Location: [facility];  Service: Plastics;  Laterality: Bilateral;   CESAREAN SECTION  07/2010   [facility]   CESAREAN SECTION N/A 12/27/2012   womens 2nd    NIPPLE SPARING MASTECTOMY Left 09/09/2020   Procedure: LEFT NIPPLE SPARING MASTECTOMY;  Surgeon: Wakefield, Matthew, MD;  Location: [facility];  Service: General;  Laterality: Left;   NIPPLE SPARING MASTECTOMY WITH SENTINEL LYMPH NODE BIOPSY Right 09/09/2020    Procedure: RIGHT NIPPLE SPARING MASTECTOMY WITH RIGHT AXILLARY SENTINEL LYMPH NODE BIOPSY;  Surgeon: Wakefield, Matthew, MD;  Location: [facility];  Service: General;  Laterality: Right;   PORTACATH PLACEMENT Right 02/04/2020   Procedure: INSERTION PORT-A-CATH WITH ULTRASOUND;  Surgeon: Wakefield, Matthew, MD;  [facility];  Service: General;  Laterality: Right;   WISDOM TOOTH EXTRACTION      There were no vitals filed for this visit.   Subjective Assessment - 10/08/20 1304     Subjective My shoulders werent too sore after yesterday. I think my ROM may be a little bit improved.    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 She underwent neoadjuvant chemotherapy 02/05/2020 - 08/18/2020 followed by a bilateral mastectomy and right sentinel node biopsy (3 negative nodes) on 09/09/2020. The mass measures 2 cm and is located in the upper outer quadrant with a Ki67 of 10%.    Patient Stated Goals See how my arms are doing    Currently in Pain? No/denies    Pain Score 0-No pain                               [facility] Adult PT Treatment/Exercise - 10/08/20 0001       Manual Therapy     Soft tissue mobilization to R pec and along SLNB scar    Myofascial Release to R axilla    Passive ROM to R shoulder in to flexion, abduction and scaption while performing myofascial release to cording in R axilla and STM to R pec                          PT Long Term Goals - 09/28/20 1614       PT LONG TERM GOAL #1   Title Patient will demonstrate she has regained full shoulder ROM and function post operatively compared to baselines.    Time 4    Period Weeks    Status On-going    Target Date 10/26/20      PT LONG TERM GOAL #2   Title Patient will increase right shoulder flexion to 150 degrees for increased ease reaching overhead.    Time 4    Period  Weeks    Status New    Target Date 10/26/20      PT LONG TERM GOAL #3   Title Patient will increase right shoulder abduction to 160 degrees for ability to obtain radiation positioning.    Time 4    Period Weeks    Status New    Target Date 10/26/20      PT LONG TERM GOAL #4   Title Patient will improve her DASH score to be </= 8 for improved overall UE function.    Time 4    Period Weeks    Status New    Target Date 10/26/20      PT LONG TERM GOAL #5   Title Patient will report good understanding of lymphedema risk reduction practices.    Time 4    Period Weeks    Status New    Target Date 10/26/20                   Plan - 10/08/20 1350     Clinical Impression Statement Conitnued with AAROM exercises and PROM to R shoulder. Pt is still limited with ROM so continued this today to decrease tightness. Performed gently soft tissue mobilization along SLNB scar to help decrease sensitivity and scar tissue. Was able to achieve more PROM than yesterday. Encouraged pt to continue with dowel exercises at home.    PT Frequency 2x / week    PT Duration 4 weeks    PT Treatment/Interventions ADLs/Self Care Home Management;Therapeutic exercise;Patient/family education;Manual techniques;Passive range of motion;Scar mobilization    PT Next Visit Plan continue PROM and AAROM exercises focused more on right shoulder, MFR R axillary cording    PT Home Exercise Plan Post op shoulder ROM HEP, supine dowel shoulder    Consulted and Agree with Plan of Care Patient             Patient will benefit from skilled therapeutic intervention in order to improve the following deficits and impairments:  Postural dysfunction, Decreased range of motion, Impaired UE functional use, Pain, Decreased knowledge of precautions, Increased fascial restricitons, Decreased strength, Decreased scar mobility  Visit Diagnosis: Stiffness of right shoulder, not elsewhere classified  Stiffness of left shoulder,  not elsewhere classified  Aftercare following surgery for neoplasm  Malignant neoplasm of upper-outer quadrant of right breast in female, estrogen receptor positive ([facility])     Problem List Patient Active Problem List   Diagnosis Date Noted   Breast cancer, right ([facility]) 09/09/2020   Malignant tumor of   breast ([place]) 05/12/2020   Port-A-Cath in place 03/19/2020   BRCA2 gene mutation positive 02/21/2020   Genetic testing 02/13/2020   Family history of prostate cancer    Family history of uterine cancer    Family history of esophageal cancer    Malignant neoplasm of upper outer quadrant of female breast ([place]) 01/28/2020   History of abnormal cervical Pap smear 11/16/2018   Family history of type 2 diabetes mellitus in father 11/16/2018   Family history of breast cancer in mother 11/16/2018   Mild hyperlipidemia 11/02/2017   Vitamin B12 deficiency 02/20/2017   Generalized headaches    LGSIL (low grade squamous intraepithelial lesion) on Pap smear 10/02/2011    Manus Gunning, PT 10/08/2020, 1:52 PM  [place], [place], [address] Phone: [phone]   Fax:  [phone]  Name: Sydney Herman MRN: [id number] Date of Birth: [birth year]  Manus Gunning, PT 10/08/20 1:52 PM

## 2020-10-12 ENCOUNTER — Other Ambulatory Visit: Payer: Self-pay | Admitting: *Deleted

## 2020-10-12 DIAGNOSIS — Z170 Estrogen receptor positive status [ER+]: Secondary | ICD-10-CM

## 2020-10-12 DIAGNOSIS — C50411 Malignant neoplasm of upper-outer quadrant of right female breast: Secondary | ICD-10-CM

## 2020-10-13 ENCOUNTER — Encounter: Payer: Self-pay | Admitting: *Deleted

## 2020-10-13 NOTE — Progress Notes (Signed)
[facility] CSW Progress Notes  SW Intern contacted patient by phone and left a voicemail to offer information about support services and encouraging her to call back. Patient was referred by distress screen protocol.  Rosary Lively, BSW Intern Supervised by Gwinda Maine, LCSW

## 2020-10-13 NOTE — Progress Notes (Signed)
Patient Care Team: Emeterio Reeve, DO as PCP - General (Osteopathic Medicine) [name], MD as Consulting Physician (Obstetrics and Gynecology) Mauro Kaufmann, RN as Oncology Nurse Navigator Rockwell Germany, RN as Oncology Nurse Navigator  DIAGNOSIS:    ICD-10-CM   1. Malignant neoplasm of upper-outer quadrant of right breast in female, estrogen receptor positive ([place])  C50.411    Z17.0       SUMMARY OF ONCOLOGIC HISTORY: Oncology History  Malignant neoplasm of upper outer quadrant of female breast ([place])  01/28/2020 Initial Diagnosis   Patient palpated a right breast lump. Diagnostic mammogram and US showed calcifications spanning 2.0cm in the right breast. Biopsy showed invasive and in situ carcinoma, grade 2. ER 5% weak, PR 0% negative, HER2 equiv, Ki 10%     01/28/2020 Cancer Staging   Staging form: Breast, AJCC 8th Edition - Clinical stage from 01/28/2020: Stage IIA (cT2, cN0, cM0, G2, ER+, PR-, HER2-) - Signed by Hayden Pedro, PA-C on 10/07/2020 Stage prefix: Initial diagnosis Method of lymph node assessment: Clinical   02/05/2020 - 08/18/2020 Chemotherapy    Patient is on Treatment Plan: BREAST PEMBROLIZUMAB Q21D       02/13/2020 Genetic Testing   Positive genetic testing:  A single, heterozygous, pathogenic variant was detected in the BRCA2 gene called [id number]. Testing was completed through the CustomNext-Cancer + RNAinsight panel offered by [facility]. 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 The report date is 02/13/2020.  The CustomNext-Cancer+RNAinsight panel offered by [facility] includes sequencing and rearrangement analysis for the following 47 genes:  APC, ATM, AXIN2, BARD1, BMPR1A, BRCA1, BRCA2, BRIP1, CDH1, CDK4, CDKN2A, CHEK2, DICER1, EPCAM, GREM1, HOXB13, MEN1, MLH1, MSH2, MSH3, MSH6, MUTYH, NBN, NF1, NF2, NTHL1, PALB2, PMS2, POLD1, POLE, PTEN, RAD51C,  RAD51D, RECQL, RET, SDHA, SDHAF2, SDHB, SDHC, SDHD, SMAD4, SMARCA4, STK11, TP53, TSC1, TSC2, and VHL.  RNA data is routinely analyzed for use in variant interpretation for all genes.   08/25/2020 -  Chemotherapy   Patient is on Treatment Plan : BREAST Pembrolizumab q21d       CHIEF COMPLIANT: Cycle 11 Keytruda  INTERVAL HISTORY: ARDIS LAWLEY is a 40 y.o. with above-mentioned history of right breast cancer currently on Keytruda. She presents to the clinic today for cycle 11.  She is tolerating Keytruda extremely well without any problems or concerns.  She has experienced some cording in the axilla which was evaluated by physical therapy and it has already improved significantly.  ALLERGIES:  is allergic to amoxil [amoxicillin].  MEDICATIONS:  Current Outpatient Medications  Medication Sig Dispense Refill   cycloSPORINE (RESTASIS) 0.05 % ophthalmic emulsion INSILL 1 DROP INTO BOTH EYES TWICE DAILY 180 mL 4   escitalopram (LEXAPRO) 10 MG tablet Take 1 tablet (10 mg total) by mouth daily. 90 tablet 3   lidocaine-prilocaine (EMLA) cream APPLY TO AFFECTED AREA ONCE 30 g 3   No current facility-administered medications for this visit.    PHYSICAL EXAMINATION: ECOG PERFORMANCE STATUS: 1 - Symptomatic but completely ambulatory  Vitals:   10/14/20 0918  BP: 110/84  Pulse: 76  Resp: 18  Temp: 98.1 F (36.7 C)  SpO2: 100%   Filed Weights   10/14/20 0918  Weight: 123 lb 3.2 oz (55.9 kg)    LABORATORY DATA:  I have reviewed the data as listed CMP Latest Ref Rng & Units 08/25/2020 08/18/2020 08/11/2020  Glucose 70 - 99 mg/dL 92 103(H) 94  BUN 6 -  20 mg/dL '9 9 9  ' Creatinine 0.44 - 1.00 mg/dL 0.68 0.68 0.64  Sodium 135 - 145 mmol/L 138 140 136  Potassium 3.5 - 5.1 mmol/L 3.9 3.9 3.8  Chloride 98 - 111 mmol/L 105 105 101  CO2 22 - 32 mmol/L '25 28 28  ' Calcium 8.9 - 10.3 mg/dL 9.3 9.9 9.5  Total Protein 6.5 - 8.1 g/dL 7.3 7.5 7.2  Total Bilirubin 0.3 - 1.2 mg/dL 0.4 <0.2(L) 0.4  Alkaline  Phos 38 - 126 U/L 73 83 84  AST 15 - 41 U/L '27 30 28  ' ALT 0 - 44 U/L '28 31 28    ' Lab Results  Component Value Date   WBC 4.8 10/14/2020   HGB 11.7 (L) 10/14/2020   HCT 34.8 (L) 10/14/2020   MCV 95.9 10/14/2020   PLT 190 10/14/2020   NEUTROABS 3.0 10/14/2020    ASSESSMENT & PLAN:  Malignant neoplasm of upper outer quadrant of female breast ([place]) 01/28/2020: Palpable right breast lump: Mammogram and ultrasound revealed calcifications spanning 2 cm, biopsy revealed IDC with DCIS, grade 2, 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     Treatment plan: 1. Neo- adjuvant chemotherapy with dose dense Adriamycin and Cytoxan/Keytruda followed by Taxol and carboplatin completed 08/18/2020 2. bilateral mastectomies:09/09/2020: Left mastectomy: Negative for cancer, right mastectomy: Pathologic complete response 0/3 lymph nodes negative 3. Adjuvant antiestrogen therapy (once patient undergoes hysterectomy and bilateral salpingo-oophorectomy, we will start aromatase inhibitor therapy) Based on extensive discussion back-and-forth we determined that she does not need adjuvant radiation.  _______________________________________________________________________ 09/09/2020: Left mastectomy: Negative for cancer, right mastectomy: Pathologic complete response 0/3 lymph nodes negative  Current treatment: Keytruda maintenance Keytruda toxicities: None  Cording in the axilla: Marked improvement with physical therapy with increased range of motion. Return to clinic every 3 weeks for [facility] and every 6 weeks for follow-up with me.    No orders of the defined types were placed in this encounter.  The patient has a good understanding of the overall plan. she agrees with it. she will call with any problems that may develop before  the next visit here.  Total time spent: 30 mins including face to face time and time spent for planning, charting and coordination of care  Rulon Eisenmenger, MD, MPH 10/14/2020  I, Thana Ates, am acting as scribe for Dr. Nicholas Lose.  I have reviewed the above documentation for accuracy and completeness, and I agree with the above.

## 2020-10-14 ENCOUNTER — Other Ambulatory Visit: Payer: Self-pay

## 2020-10-14 ENCOUNTER — Inpatient Hospital Stay: Payer: 59

## 2020-10-14 ENCOUNTER — Ambulatory Visit: Payer: 59 | Attending: Hematology and Oncology | Admitting: Physical Therapy

## 2020-10-14 ENCOUNTER — Inpatient Hospital Stay (HOSPITAL_BASED_OUTPATIENT_CLINIC_OR_DEPARTMENT_OTHER): Payer: 59 | Admitting: Hematology and Oncology

## 2020-10-14 ENCOUNTER — Encounter: Payer: Self-pay | Admitting: *Deleted

## 2020-10-14 ENCOUNTER — Encounter: Payer: Self-pay | Admitting: Physical Therapy

## 2020-10-14 DIAGNOSIS — M25611 Stiffness of right shoulder, not elsewhere classified: Secondary | ICD-10-CM | POA: Diagnosis not present

## 2020-10-14 DIAGNOSIS — Z79899 Other long term (current) drug therapy: Secondary | ICD-10-CM | POA: Insufficient documentation

## 2020-10-14 DIAGNOSIS — C50411 Malignant neoplasm of upper-outer quadrant of right female breast: Secondary | ICD-10-CM

## 2020-10-14 DIAGNOSIS — Z5112 Encounter for antineoplastic immunotherapy: Secondary | ICD-10-CM | POA: Insufficient documentation

## 2020-10-14 DIAGNOSIS — Z23 Encounter for immunization: Secondary | ICD-10-CM | POA: Insufficient documentation

## 2020-10-14 DIAGNOSIS — Z170 Estrogen receptor positive status [ER+]: Secondary | ICD-10-CM

## 2020-10-14 DIAGNOSIS — Z79621 Long term (current) use of calcineurin inhibitor: Secondary | ICD-10-CM | POA: Insufficient documentation

## 2020-10-14 DIAGNOSIS — Z880 Allergy status to penicillin: Secondary | ICD-10-CM | POA: Insufficient documentation

## 2020-10-14 DIAGNOSIS — M25612 Stiffness of left shoulder, not elsewhere classified: Secondary | ICD-10-CM | POA: Insufficient documentation

## 2020-10-14 DIAGNOSIS — Z483 Aftercare following surgery for neoplasm: Secondary | ICD-10-CM | POA: Insufficient documentation

## 2020-10-14 DIAGNOSIS — Z95828 Presence of other vascular implants and grafts: Secondary | ICD-10-CM

## 2020-10-14 LAB — TSH: TSH: 0.523 u[IU]/mL (ref 0.308–3.960)

## 2020-10-14 LAB — CMP (CANCER CENTER ONLY)
ALT: 17 U/L (ref 0–44)
AST: 28 U/L (ref 15–41)
Albumin: 4.4 g/dL (ref 3.5–5.0)
Alkaline Phosphatase: 72 U/L (ref 38–126)
Anion gap: 11 (ref 5–15)
BUN: 11 mg/dL (ref 6–20)
CO2: 26 mmol/L (ref 22–32)
Calcium: 9.9 mg/dL (ref 8.9–10.3)
Chloride: 102 mmol/L (ref 98–111)
Creatinine: 0.79 mg/dL (ref 0.44–1.00)
GFR, Estimated: >60 mL/min (ref >60)
Glucose, Bld: 90 mg/dL (ref 70–99)
Potassium: 4 mmol/L (ref 3.5–5.1)
Sodium: 139 mmol/L (ref 135–145)
Total Bilirubin: 0.4 mg/dL (ref 0.3–1.2)
Total Protein: 7.7 g/dL (ref 6.5–8.1)

## 2020-10-14 LAB — CBC WITH DIFFERENTIAL (CANCER CENTER ONLY)
Abs Immature Granulocytes: 0.01 10*3/uL (ref 0.00–0.07)
Basophils Absolute: 0 10*3/uL (ref 0.0–0.1)
Basophils Relative: 0 %
Eosinophils Absolute: 0.1 10*3/uL (ref 0.0–0.5)
Eosinophils Relative: 2 %
HCT: 34.8 % — ABNORMAL LOW (ref 36.0–46.0)
Hemoglobin: 11.7 g/dL — ABNORMAL LOW (ref 12.0–15.0)
Immature Granulocytes: 0 %
Lymphocytes Relative: 25 %
Lymphs Abs: 1.2 10*3/uL (ref 0.7–4.0)
MCH: 32.2 pg (ref 26.0–34.0)
MCHC: 33.6 g/dL (ref 30.0–36.0)
MCV: 95.9 fL (ref 80.0–100.0)
Monocytes Absolute: 0.5 10*3/uL (ref 0.1–1.0)
Monocytes Relative: 11 %
Neutro Abs: 3 10*3/uL (ref 1.7–7.7)
Neutrophils Relative %: 62 %
Platelet Count: 190 10*3/uL (ref 150–400)
RBC: 3.63 MIL/uL — ABNORMAL LOW (ref 3.87–5.11)
RDW: 12.1 % (ref 11.5–15.5)
WBC Count: 4.8 10*3/uL (ref 4.0–10.5)
nRBC: 0 % (ref 0.0–0.2)

## 2020-10-14 MED ORDER — SODIUM CHLORIDE 0.9 % IV SOLN
200.0000 mg | Freq: Once | INTRAVENOUS | Status: AC
  Administered 2020-10-14: 200 mg via INTRAVENOUS
  Filled 2020-10-14: qty 8

## 2020-10-14 MED ORDER — SODIUM CHLORIDE 0.9% FLUSH
10.0000 mL | Freq: Once | INTRAVENOUS | Status: AC
  Administered 2020-10-14: 10 mL

## 2020-10-14 MED ORDER — SODIUM CHLORIDE 0.9 % IV SOLN: Freq: Once | INTRAVENOUS | Status: DC

## 2020-10-14 NOTE — Assessment & Plan Note (Signed)
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  Treatment plan: 1. Neo- adjuvant chemotherapy with dose dense Adriamycin and Cytoxan/Keytrudafollowed by Taxol and carboplatin completed 08/18/2020 2.bilateral mastectomies:09/09/2020: Left mastectomy: Negative for cancer, right mastectomy: Pathologic complete response 0/3 lymph nodes negative 3. Adjuvant antiestrogen therapy (once patient undergoes hysterectomy and bilateral salpingo-oophorectomy, we will start aromatase inhibitor therapy)  _______________________________________________________________________ 09/09/2020: Left mastectomy: Negative for cancer, right mastectomy: Pathologic complete response 0/3 lymph nodes negative  Current treatment: Keytruda maintenance Keytruda toxicities: None  Return to clinic every 3 weeks for Keytruda and every 6 weeks for follow-up with me. 

## 2020-10-14 NOTE — Therapy (Signed)
[facility] @ [place], [place], [address] Phone:     Fax:     Physical Therapy Treatment  Patient Details  Name: Sydney Herman MRN: [id number] Date of Birth: 07/14/1980 Referring Provider (PT): Dr. Nicholas Lose   Encounter Date: 10/14/2020   PT End of Session - 10/14/20 1556     Visit Number 5    Number of Visits 10    Date for PT Re-Evaluation 10/26/20    PT Start Time 1507    PT Stop Time 1554    PT Time Calculation (min) 47 min    Activity Tolerance Patient tolerated treatment well    Behavior During Therapy [facility] for tasks assessed/performed             Past Medical History:  Diagnosis Date   Abdominal wall mass of right lower quadrant 05/17/2013   Saw Dr. Georgette Dover 2015- was thought to be scar tissue- no growth since that time    Breast cancer ([place]) 01/23/2020   Right   Chicken pox    Family history of esophageal cancer    Family history of prostate cancer    Family history of uterine cancer    Generalized headaches    Has had some migraines in past as well. once a week or less. Usually occipital and associated with sinus pressure as well.    Heartburn in pregnancy     Past Surgical History:  Procedure Laterality Date   BREAST RECONSTRUCTION WITH PLACEMENT OF TISSUE EXPANDER AND ALLODERM Bilateral 09/09/2020   Procedure: BILATERAL BREAST RECONSTRUCTION WITH PLACEMENT OF TISSUE EXPANDER AND ALLODERM;  Surgeon: Irene Limbo, MD;  Location: [facility];  Service: Plastics;  Laterality: Bilateral;   [facility]   CESAREAN SECTION N/A 12/27/2012   womens 2nd    NIPPLE SPARING MASTECTOMY Left 09/09/2020   Procedure: LEFT NIPPLE SPARING MASTECTOMY;  Surgeon: Rolm Bookbinder, MD;  Location: [facility];  Service: General;  Laterality: Left;   NIPPLE SPARING MASTECTOMY WITH SENTINEL LYMPH NODE BIOPSY Right 09/09/2020   Procedure: RIGHT NIPPLE  SPARING MASTECTOMY WITH RIGHT AXILLARY SENTINEL LYMPH NODE BIOPSY;  Surgeon: Rolm Bookbinder, MD;  Location: [facility];  Service: General;  Laterality: Right;   PORTACATH PLACEMENT Right 02/04/2020   Procedure: INSERTION PORT-A-CATH WITH ULTRASOUND;  Surgeon: Rolm Bookbinder, MD;  Location: [facility];  Service: General;  Laterality: Right;   WISDOM TOOTH EXTRACTION      There were no vitals filed for this visit.   Subjective Assessment - 10/14/20 1508     Subjective I feel like I have noticed an improvement with my ROM.    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 She underwent neoadjuvant chemotherapy 02/05/2020 - 08/18/2020 followed by a bilateral mastectomy and right sentinel node biopsy (3 negative nodes) on 09/09/2020. The mass measures 2 cm and is located in the upper outer quadrant with a Ki67 of 10%.    Patient Stated Goals See how my arms are doing    Currently in Pain? No/denies    Pain Score 0-No pain                [facility] PT Assessment - 10/14/20 0001       AROM   Right Shoulder Extension 58 Degrees (P)     Right Shoulder Flexion 150 Degrees (  P)     Right Shoulder ABduction 152 Degrees (P)     Right Shoulder Internal Rotation 61 Degrees (P)     Right Shoulder External Rotation 89 Degrees (P)     Left Shoulder Extension 66 Degrees (P)     Left Shoulder Flexion 163 Degrees (P)     Left Shoulder ABduction 180 Degrees (P)     Left Shoulder Internal Rotation 68 Degrees (P)     Left Shoulder External Rotation 90 Degrees (P)                            [facility] Adult PT Treatment/Exercise - 10/14/20 0001       Manual Therapy   Soft tissue mobilization to R pec, R serratus, and along SLNB scar    Myofascial Release to R axilla in area of cording    Passive ROM to R shoulder in to flexion, abduction and scaption while performing  myofascial release to cording in R axilla and STM to R pec                          PT Long Term Goals - 09/28/20 1614       PT LONG TERM GOAL #1   Title Patient will demonstrate she has regained full shoulder ROM and function post operatively compared to baselines.    Time 4    Period Weeks    Status On-going    Target Date 10/26/20      PT LONG TERM GOAL #2   Title Patient will increase right shoulder flexion to 150 degrees for increased ease reaching overhead.    Time 4    Period Weeks    Status New    Target Date 10/26/20      PT LONG TERM GOAL #3   Title Patient will increase right shoulder abduction to 160 degrees for ability to obtain radiation positioning.    Time 4    Period Weeks    Status New    Target Date 10/26/20      PT LONG TERM GOAL #4   Title Patient will improve her DASH score to be </= 8 for improved overall UE function.    Time 4    Period Weeks    Status New    Target Date 10/26/20      PT LONG TERM GOAL #5   Title Patient will report good understanding of lymphedema risk reduction practices.    Time 4    Period Weeks    Status New    Target Date 10/26/20                   Plan - 10/14/20 1557     Clinical Impression Statement Remeasured pt's ROM and it has improved signifcant since her post op eval. She is still having tightness in R axilla due to cording and pec tightness so continued with manual therapy to improve this. Pt has one cord palpable in medial axilla. Pt was able to achieve nearly full PROM during therapy today. Educated pt to continue to stretch her arm at home and not to be afraid to push in to the stretch a little. Pt still with sensitivity and tightness along R SLNB scar so continued with gentle soft tissue mobilization to this area today.    PT Frequency 2x / week    PT Duration 4 weeks  PT Treatment/Interventions ADLs/Self Care Home Management;Therapeutic exercise;Patient/family education;Manual  techniques;Passive range of motion;Scar mobilization    PT Next Visit Plan instruct in supine scap exercises, continue PROM and AAROM exercises focused more on right shoulder, MFR R axillary cording    PT Home Exercise Plan Post op shoulder ROM HEP, supine dowel shoulder    Consulted and Agree with Plan of Care Patient             Patient will benefit from skilled therapeutic intervention in order to improve the following deficits and impairments:  Postural dysfunction, Decreased range of motion, Impaired UE functional use, Pain, Decreased knowledge of precautions, Increased fascial restricitons, Decreased strength, Decreased scar mobility  Visit Diagnosis: Stiffness of right shoulder, not elsewhere classified  Stiffness of left shoulder, not elsewhere classified  Aftercare following surgery for neoplasm  Malignant neoplasm of upper-outer quadrant of right breast in female, estrogen receptor positive ([place])     Problem List Patient Active Problem List   Diagnosis Date Noted   Breast cancer, right ([place]) 09/09/2020   Malignant tumor of breast ([place]) 05/12/2020   Port-A-Cath in place 03/19/2020   BRCA2 gene mutation positive 02/21/2020   Genetic testing 02/13/2020   Family history of prostate cancer    Family history of uterine cancer    Family history of esophageal cancer    Malignant neoplasm of upper outer quadrant of female breast ([place]) 01/28/2020   History of abnormal cervical Pap smear 11/16/2018   Family history of type 2 diabetes mellitus in father 11/16/2018   Family history of breast cancer in mother 11/16/2018   Mild hyperlipidemia 11/02/2017   Vitamin B12 deficiency 02/20/2017   Generalized headaches    LGSIL (low grade squamous intraepithelial lesion) on Pap smear 10/02/2011    Manus Gunning, PT 10/14/2020, 4:00 PM  [facility] @ [place], [place], [address] Phone:     Fax:      Name: Sydney Herman MRN: [id number] Date of Birth: 1980-10-09   Manus Gunning, PT 10/14/20 4:00 PM

## 2020-10-16 ENCOUNTER — Other Ambulatory Visit: Payer: Self-pay

## 2020-10-16 ENCOUNTER — Ambulatory Visit: Payer: 59 | Admitting: Rehabilitation

## 2020-10-16 ENCOUNTER — Encounter: Payer: Self-pay | Admitting: Rehabilitation

## 2020-10-16 DIAGNOSIS — M25612 Stiffness of left shoulder, not elsewhere classified: Secondary | ICD-10-CM | POA: Diagnosis not present

## 2020-10-16 DIAGNOSIS — M25611 Stiffness of right shoulder, not elsewhere classified: Secondary | ICD-10-CM

## 2020-10-16 DIAGNOSIS — Z170 Estrogen receptor positive status [ER+]: Secondary | ICD-10-CM

## 2020-10-16 DIAGNOSIS — C50411 Malignant neoplasm of upper-outer quadrant of right female breast: Secondary | ICD-10-CM

## 2020-10-16 DIAGNOSIS — Z483 Aftercare following surgery for neoplasm: Secondary | ICD-10-CM | POA: Diagnosis not present

## 2020-10-16 NOTE — Patient Instructions (Signed)

## 2020-10-16 NOTE — Therapy (Signed)
[facility] @ [place], [place], [address] Phone: [phone]   Fax:  [phone]  Physical Therapy Treatment  Patient Details  Name: Sydney Herman MRN: [id number] Date of Birth: Feb 04, 1980 Referring Provider (PT): Dr. Nicholas Lose   Encounter Date: 10/16/2020   PT End of Session - 10/16/20 0950     Visit Number 6    Number of Visits 10    Date for PT Re-Evaluation 10/26/20    PT Start Time 0900    PT Stop Time 0949    PT Time Calculation (min) 49 min    Activity Tolerance Patient tolerated treatment well    Behavior During Therapy [facility] for tasks assessed/performed             Past Medical History:  Diagnosis Date   Abdominal wall mass of right lower quadrant 05/17/2013   Saw Dr. Georgette Dover 2015- was thought to be scar tissue- no growth since that time    Breast cancer ([place]) 01/23/2020   Right   Chicken pox    Family history of esophageal cancer    Family history of prostate cancer    Family history of uterine cancer    Generalized headaches    Has had some migraines in past as well. once a week or less. Usually occipital and associated with sinus pressure as well.    Heartburn in pregnancy     Past Surgical History:  Procedure Laterality Date   BREAST RECONSTRUCTION WITH PLACEMENT OF TISSUE EXPANDER AND ALLODERM Bilateral 09/09/2020   Procedure: BILATERAL BREAST RECONSTRUCTION WITH PLACEMENT OF TISSUE EXPANDER AND ALLODERM;  Surgeon: Irene Limbo, MD;  Location: [facility];  Service: Plastics;  Laterality: Bilateral;   CESAREAN SECTION  07/2010   [facility]   CESAREAN SECTION N/A 12/27/2012   womens 2nd    NIPPLE SPARING MASTECTOMY Left 09/09/2020   Procedure: LEFT NIPPLE SPARING MASTECTOMY;  Surgeon: Rolm Bookbinder, MD;  Location: [facility];  Service: General;  Laterality: Left;   NIPPLE SPARING MASTECTOMY WITH SENTINEL LYMPH NODE BIOPSY Right 09/09/2020    Procedure: RIGHT NIPPLE SPARING MASTECTOMY WITH RIGHT AXILLARY SENTINEL LYMPH NODE BIOPSY;  Surgeon: Rolm Bookbinder, MD;  Location: [facility];  Service: General;  Laterality: Right;   PORTACATH PLACEMENT Right 02/04/2020   Procedure: INSERTION PORT-A-CATH WITH ULTRASOUND;  Surgeon: Rolm Bookbinder, MD;  Location: [facility];  Service: General;  Laterality: Right;   WISDOM TOOTH EXTRACTION      There were no vitals filed for this visit.   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 She underwent neoadjuvant chemotherapy 02/05/2020 - 08/18/2020 followed by a bilateral mastectomy and right sentinel node biopsy (3 negative nodes) on 09/09/2020. The mass measures 2 cm and is located in the upper outer quadrant with a Ki67 of 10%.    Currently in Pain? No/denies                               [facility] Adult PT Treatment/Exercise - 10/16/20 0001       Exercises   Exercises Shoulder      Shoulder Exercises: Supine   Horizontal ABduction Both;10 reps    Theraband Level (Shoulder Horizontal ABduction) Level 1 (  Yellow)    External Rotation Both;10 reps    Theraband Level (Shoulder External Rotation) Level 1 (Yellow)    Flexion Both;10 reps    Theraband Level (Shoulder Flexion) Level 1 (Yellow)    Diagonals Both;10 reps    Theraband Level (Shoulder Diagonals) Level 1 (Yellow)      Shoulder Exercises: Pulleys   Flexion 2 minutes    ABduction 2 minutes      Shoulder Exercises: Therapy Ball   Flexion Both;5 reps      Manual Therapy   Soft tissue mobilization to R pec, R serratus, and along SLNB scar    Myofascial Release to R axilla in area of cording    Passive ROM to R shoulder in to flexion, abduction and scaption while performing myofascial release to cording in R axilla  and STM to R pec                     PT Education - 10/16/20 0950     Education Details supine scap    Person(s) Educated Patient    Methods Explanation;Demonstration;Tactile cues;Verbal cues;Handout    Comprehension Verbalized understanding;Returned demonstration                 PT Long Term Goals - 09/28/20 1614       PT LONG TERM GOAL #1   Title Patient will demonstrate she has regained full shoulder ROM and function post operatively compared to baselines.    Time 4    Period Weeks    Status On-going    Target Date 10/26/20      PT LONG TERM GOAL #2   Title Patient will increase right shoulder flexion to 150 degrees for increased ease reaching overhead.    Time 4    Period Weeks    Status New    Target Date 10/26/20      PT LONG TERM GOAL #3   Title Patient will increase right shoulder abduction to 160 degrees for ability to obtain radiation positioning.    Time 4    Period Weeks    Status New    Target Date 10/26/20      PT LONG TERM GOAL #4   Title Patient will improve her DASH score to be </= 8 for improved overall UE function.    Time 4    Period Weeks    Status New    Target Date 10/26/20      PT LONG TERM GOAL #5   Title Patient will report good understanding of lymphedema risk reduction practices.    Time 4    Period Weeks    Status New    Target Date 10/26/20                   Plan - 10/16/20 0950     Clinical Impression Statement Pt continues to show improvements.  Was able to ride a bike and is anxious to get back to pure barre and weights at home.  Tried a plank and it felt too weird still.  Added supine scap today with instruction on performing 2-3x per week.  Also discussed pure barre and return with modifications on the wall for plank and either unweighted or slower UE.    PT Frequency 2x / week    PT Duration 4 weeks    PT Treatment/Interventions ADLs/Self Care Home Management;Therapeutic exercise;Patient/family  education;Manual techniques;Passive range of motion;Scar mobilization    PT Next Visit Plan continue PROM  and AAROM exercises focused more on right shoulder, MFR R axillary cording    Consulted and Agree with Plan of Care Patient             Patient will benefit from skilled therapeutic intervention in order to improve the following deficits and impairments:  Postural dysfunction, Decreased range of motion, Impaired UE functional use, Pain, Decreased knowledge of precautions, Increased fascial restricitons, Decreased strength, Decreased scar mobility  Visit Diagnosis: Stiffness of right shoulder, not elsewhere classified  Stiffness of left shoulder, not elsewhere classified  Aftercare following surgery for neoplasm  Malignant neoplasm of upper-outer quadrant of right breast in female, estrogen receptor positive ([place])     Problem List Patient Active Problem List   Diagnosis Date Noted   Breast cancer, right ([place]) 09/09/2020   Malignant tumor of breast ([place]) 05/12/2020   Port-A-Cath in place 03/19/2020   BRCA2 gene mutation positive 02/21/2020   Genetic testing 02/13/2020   Family history of prostate cancer    Family history of uterine cancer    Family history of esophageal cancer    Malignant neoplasm of upper outer quadrant of female breast ([place]) 01/28/2020   History of abnormal cervical Pap smear 11/16/2018   Family history of type 2 diabetes mellitus in father 11/16/2018   Family history of breast cancer in mother 11/16/2018   Mild hyperlipidemia 11/02/2017   Vitamin B12 deficiency 02/20/2017   Generalized headaches    LGSIL (low grade squamous intraepithelial lesion) on Pap smear 10/02/2011    Stark Bray, PT 10/16/2020, 9:53 AM  [facility] @ [place], [place], [address] Phone: [phone]   Fax:  [phone]  Name: Sydney Herman MRN: [id number] Date of Birth: May 31, 1980

## 2020-10-19 ENCOUNTER — Ambulatory Visit: Payer: 59 | Admitting: Rehabilitation

## 2020-10-19 ENCOUNTER — Encounter: Payer: Self-pay | Admitting: Rehabilitation

## 2020-10-19 ENCOUNTER — Other Ambulatory Visit: Payer: Self-pay

## 2020-10-19 DIAGNOSIS — M25612 Stiffness of left shoulder, not elsewhere classified: Secondary | ICD-10-CM | POA: Diagnosis not present

## 2020-10-19 DIAGNOSIS — Z170 Estrogen receptor positive status [ER+]: Secondary | ICD-10-CM | POA: Diagnosis not present

## 2020-10-19 DIAGNOSIS — M25611 Stiffness of right shoulder, not elsewhere classified: Secondary | ICD-10-CM | POA: Diagnosis not present

## 2020-10-19 DIAGNOSIS — C50411 Malignant neoplasm of upper-outer quadrant of right female breast: Secondary | ICD-10-CM

## 2020-10-19 DIAGNOSIS — Z483 Aftercare following surgery for neoplasm: Secondary | ICD-10-CM

## 2020-10-19 NOTE — Therapy (Signed)
[facility] @ [place], [place], [address] Phone: [phone]   Fax:  [phone]  Physical Therapy Treatment  Patient Details  Name: [facility] MRN: [id number] Date of Birth: [birth year] Referring Provider (PT): Dr. Nicholas Lose   Encounter Date: 10/19/2020   PT End of Session - 10/19/20 1050     Visit Number 7    Number of Visits 10    Date for PT Re-Evaluation 10/26/20    PT Start Time 1000    PT Stop Time 1049    PT Time Calculation (min) 49 min    Activity Tolerance Patient tolerated treatment well    Behavior During Therapy [facility] for tasks assessed/performed             Past Medical History:  Diagnosis Date   Abdominal wall mass of right lower quadrant 05/17/2013   Saw Dr. Georgette Dover 2015- was thought to be scar tissue- no growth since that time    Breast cancer [facility]) 01/23/2020   Right   Chicken pox    Family history of esophageal cancer    Family history of prostate cancer    Family history of uterine cancer    Generalized headaches    Has had some migraines in past as well. once a week or less. Usually occipital and associated with sinus pressure as well.    Heartburn in pregnancy     Past Surgical History:  Procedure Laterality Date   BREAST RECONSTRUCTION WITH PLACEMENT OF TISSUE EXPANDER AND ALLODERM Bilateral 09/09/2020   Procedure: BILATERAL BREAST RECONSTRUCTION WITH PLACEMENT OF TISSUE EXPANDER AND ALLODERM;  Surgeon: Irene Limbo, MD;  Location: [place];  Service: Plastics;  Laterality: Bilateral;   CESAREAN SECTION  07/2010   [facility]   CESAREAN SECTION N/A 12/27/2012   womens 2nd    NIPPLE SPARING MASTECTOMY Left 09/09/2020   Procedure: LEFT NIPPLE SPARING MASTECTOMY;  Surgeon: Rolm Bookbinder, MD;  Location: [place];  Service: General;  Laterality: Left;   NIPPLE SPARING MASTECTOMY WITH SENTINEL LYMPH NODE BIOPSY Right 09/09/2020    Procedure: RIGHT NIPPLE SPARING MASTECTOMY WITH RIGHT AXILLARY SENTINEL LYMPH NODE BIOPSY;  Surgeon: Rolm Bookbinder, MD;  Location: [place];  Service: General;  Laterality: Right;   PORTACATH PLACEMENT Right 02/04/2020   Procedure: INSERTION PORT-A-CATH WITH ULTRASOUND;  Surgeon: Rolm Bookbinder, MD;  Location: [place];  Service: General;  Laterality: Right;   WISDOM TOOTH EXTRACTION      There were no vitals filed for this visit.   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 She underwent neoadjuvant chemotherapy 02/05/2020 - 08/18/2020 followed by a bilateral mastectomy and right sentinel node biopsy (3 negative nodes) on 09/09/2020. The mass measures 2 cm and is located in the upper outer quadrant with a Ki67 of 10%.    Currently in Pain? No/denies                       Katina Dung - 10/19/20 0001     Open a tight or new jar No difficulty    Do heavy household chores (wash walls, wash floors) Mild difficulty    Carry a shopping bag or briefcase No difficulty    Wash your back No difficulty  Use a knife to cut food No difficulty    Recreational activities in which you take some force or impact through your arm, shoulder, or hand (golf, hammering, tennis) Mild difficulty    During the past week, to what extent has your arm, shoulder or hand problem interfered with your normal social activities with family, friends, neighbors, or groups? Not at all    During the past week, to what extent has your arm, shoulder or hand problem limited your work or other regular daily activities Not at all    Arm, shoulder, or hand pain. None    Tingling (pins and needles) in your arm, shoulder, or hand None    Difficulty Sleeping No difficulty    DASH Score 4.55 %                     [facility] Adult PT Treatment/Exercise - 10/19/20 0001       Exercises   Exercises Other Exercises    Other Exercises  performed in front of mirror: bicep curls, single arm row, tricep kickback, dead lift and supine chest press 2# x 10 each with instruction and vcs on the rest of the strength ABC packet.      Shoulder Exercises: Pulleys   Flexion 2 minutes    ABduction 2 minutes      Shoulder Exercises: Therapy Ball   Flexion Both;5 reps    ABduction Right;5 reps      Manual Therapy   Soft tissue mobilization to R pec, R serratus, and along SLNB scar    Myofascial Release to R axilla in area of cording    Passive ROM to R shoulder in to flexion, abduction and scaption while performing myofascial release to cording in R axilla and STM to R pec                          PT Long Term Goals - 10/19/20 1051       PT LONG TERM GOAL #1   Title Patient will demonstrate she has regained full shoulder ROM and function post operatively compared to baselines.    Status Achieved      PT LONG TERM GOAL #2   Title Patient will increase right shoulder flexion to 150 degrees for increased ease reaching overhead.    Status Achieved      PT LONG TERM GOAL #3   Title Patient will increase right shoulder abduction to 160 degrees for ability to obtain radiation positioning.    Status Achieved      PT LONG TERM GOAL #4   Title Patient will improve her DASH score to be </= 8 for improved overall UE function.    Status Achieved      PT LONG TERM GOAL #5   Title Patient will report good understanding of lymphedema risk reduction practices.    Status Achieved                   Plan - 10/19/20 1051     Clinical Impression Statement Pt is doing well at home feeling "almost back to normal" is now ind with self stretches and strength ABC for return to pure barre as able.  Will continue with SOZO    PT Frequency 2x / week    PT Duration 4 weeks    PT  Treatment/Interventions ADLs/Self Care Home Management;Therapeutic exercise;Patient/family education;Manual techniques;Passive range of motion;Scar mobilization    PT Next Visit  Plan SOZO only for now    Consulted and Agree with Plan of Care Patient             Patient will benefit from skilled therapeutic intervention in order to improve the following deficits and impairments:     Visit Diagnosis: Stiffness of right shoulder, not elsewhere classified  Stiffness of left shoulder, not elsewhere classified  Aftercare following surgery for neoplasm  Malignant neoplasm of upper-outer quadrant of right breast in female, estrogen receptor positive ([place])     Problem List Patient Active Problem List   Diagnosis Date Noted   Breast cancer, right ([place]) 09/09/2020   Malignant tumor of breast ([place]) 05/12/2020   Port-A-Cath in place 03/19/2020   BRCA2 gene mutation positive 02/21/2020   Genetic testing 02/13/2020   Family history of prostate cancer    Family history of uterine cancer    Family history of esophageal cancer    Malignant neoplasm of upper outer quadrant of female breast ([place]) 01/28/2020   History of abnormal cervical Pap smear 11/16/2018   Family history of type 2 diabetes mellitus in father 11/16/2018   Family history of breast cancer in mother 11/16/2018   Mild hyperlipidemia 11/02/2017   Vitamin B12 deficiency 02/20/2017   Generalized headaches    LGSIL (low grade squamous intraepithelial lesion) on Pap smear 10/02/2011    Stark Bray, PT 10/19/2020, 10:53 AM  [facility] @ [place], [place], [address] Phone: [phone]   Fax:  [phone]  Name: Sydney Herman MRN: [id number] Date of Birth: 06-21-1980

## 2020-10-21 ENCOUNTER — Encounter: Payer: Self-pay | Admitting: *Deleted

## 2020-10-21 NOTE — Progress Notes (Signed)
[facility] CSW Distress Screen  Social Work Intern spoke to patient by phone per distress screen protocol. Patient scored a 6, indicating moderate distress.  ON[facility]N DISTRESS SCREENING 10/07/2020  Screening Type   Distress experienced in past week (1-10) 6  Emotional problem type Adjusting to illness  Information Concerns Type   Other    Patient reports that she feels good and has no needs at this time. Patient is nearing the end of her treatment and is interested in becoming an [place] upon completion of treatment. Intern will refer pt to [place] to become a volunteer. Pt will reach out to Support Services with any future needs.  Rosary Lively, BSW Intern Supervised by Gwinda Maine, LCSW

## 2020-10-22 ENCOUNTER — Encounter: Payer: Self-pay | Admitting: Rehabilitation

## 2020-10-26 ENCOUNTER — Encounter: Payer: Self-pay | Admitting: Rehabilitation

## 2020-10-28 ENCOUNTER — Encounter: Payer: Self-pay | Admitting: Rehabilitation

## 2020-10-30 ENCOUNTER — Telehealth: Payer: Self-pay | Admitting: Hematology and Oncology

## 2020-10-30 NOTE — Telephone Encounter (Signed)
Sch per 10/21 inbasket,left msg

## 2020-11-02 ENCOUNTER — Encounter: Payer: Self-pay | Admitting: Medical-Surgical

## 2020-11-02 ENCOUNTER — Telehealth: Payer: Self-pay | Admitting: Hematology and Oncology

## 2020-11-02 NOTE — Telephone Encounter (Signed)
Scheduled per 10/24 in basket, pt is aware of new appt time

## 2020-11-03 ENCOUNTER — Ambulatory Visit: Payer: 59 | Admitting: Physical Therapy

## 2020-11-04 ENCOUNTER — Other Ambulatory Visit: Payer: Self-pay

## 2020-11-04 ENCOUNTER — Inpatient Hospital Stay: Payer: 59

## 2020-11-04 ENCOUNTER — Other Ambulatory Visit: Payer: 59

## 2020-11-04 VITALS — BP 103/75 | HR 78 | Temp 98.4°F | Resp 16

## 2020-11-04 DIAGNOSIS — Z23 Encounter for immunization: Secondary | ICD-10-CM

## 2020-11-04 DIAGNOSIS — C50411 Malignant neoplasm of upper-outer quadrant of right female breast: Secondary | ICD-10-CM

## 2020-11-04 DIAGNOSIS — Z170 Estrogen receptor positive status [ER+]: Secondary | ICD-10-CM | POA: Diagnosis not present

## 2020-11-04 DIAGNOSIS — M25611 Stiffness of right shoulder, not elsewhere classified: Secondary | ICD-10-CM | POA: Diagnosis not present

## 2020-11-04 DIAGNOSIS — Z483 Aftercare following surgery for neoplasm: Secondary | ICD-10-CM | POA: Diagnosis not present

## 2020-11-04 DIAGNOSIS — M25612 Stiffness of left shoulder, not elsewhere classified: Secondary | ICD-10-CM | POA: Diagnosis not present

## 2020-11-04 DIAGNOSIS — Z95828 Presence of other vascular implants and grafts: Secondary | ICD-10-CM

## 2020-11-04 LAB — CMP (CANCER CENTER ONLY)
ALT: 14 U/L (ref 0–44)
AST: 25 U/L (ref 15–41)
Albumin: 4.2 g/dL (ref 3.5–5.0)
Alkaline Phosphatase: 77 U/L (ref 38–126)
Anion gap: 10 (ref 5–15)
BUN: 12 mg/dL (ref 6–20)
CO2: 25 mmol/L (ref 22–32)
Calcium: 9.4 mg/dL (ref 8.9–10.3)
Chloride: 103 mmol/L (ref 98–111)
Creatinine: 0.71 mg/dL (ref 0.44–1.00)
GFR, Estimated: >60 mL/min (ref >60)
Glucose, Bld: 90 mg/dL (ref 70–99)
Potassium: 3.9 mmol/L (ref 3.5–5.1)
Sodium: 138 mmol/L (ref 135–145)
Total Bilirubin: 0.4 mg/dL (ref 0.3–1.2)
Total Protein: 7.6 g/dL (ref 6.5–8.1)

## 2020-11-04 LAB — CBC WITH DIFFERENTIAL (CANCER CENTER ONLY)
Abs Immature Granulocytes: 0.01 10*3/uL (ref 0.00–0.07)
Basophils Absolute: 0 10*3/uL (ref 0.0–0.1)
Basophils Relative: 0 %
Eosinophils Absolute: 0 10*3/uL (ref 0.0–0.5)
Eosinophils Relative: 1 %
HCT: 35.2 % — ABNORMAL LOW (ref 36.0–46.0)
Hemoglobin: 11.6 g/dL — ABNORMAL LOW (ref 12.0–15.0)
Immature Granulocytes: 0 %
Lymphocytes Relative: 29 %
Lymphs Abs: 1.3 10*3/uL (ref 0.7–4.0)
MCH: 30.8 pg (ref 26.0–34.0)
MCHC: 33 g/dL (ref 30.0–36.0)
MCV: 93.4 fL (ref 80.0–100.0)
Monocytes Absolute: 0.5 10*3/uL (ref 0.1–1.0)
Monocytes Relative: 10 %
Neutro Abs: 2.7 10*3/uL (ref 1.7–7.7)
Neutrophils Relative %: 60 %
Platelet Count: 201 10*3/uL (ref 150–400)
RBC: 3.77 MIL/uL — ABNORMAL LOW (ref 3.87–5.11)
RDW: 12.2 % (ref 11.5–15.5)
WBC Count: 4.5 10*3/uL (ref 4.0–10.5)
nRBC: 0 % (ref 0.0–0.2)

## 2020-11-04 LAB — TSH: TSH: 0.488 u[IU]/mL (ref 0.308–3.960)

## 2020-11-04 MED ORDER — SODIUM CHLORIDE 0.9% FLUSH
10.0000 mL | Freq: Once | INTRAVENOUS | Status: AC
  Administered 2020-11-04: 10 mL

## 2020-11-04 MED ORDER — SODIUM CHLORIDE 0.9 % IV SOLN
200.0000 mg | Freq: Once | INTRAVENOUS | Status: AC
Start: 2020-11-04 — End: 2020-11-04
  Administered 2020-11-04: 200 mg via INTRAVENOUS
  Filled 2020-11-04: qty 8

## 2020-11-04 MED ORDER — SODIUM CHLORIDE 0.9% FLUSH
10.0000 mL | INTRAVENOUS | Status: DC | PRN
  Administered 2020-11-04: 10 mL

## 2020-11-04 MED ORDER — HEPARIN SOD (PORK) LOCK FLUSH 100 UNIT/ML IV SOLN
500.0000 [IU] | Freq: Once | INTRAVENOUS | Status: AC | PRN
  Administered 2020-11-04: 500 [IU]

## 2020-11-04 MED ORDER — SODIUM CHLORIDE 0.9 % IV SOLN: Freq: Once | INTRAVENOUS | Status: AC

## 2020-11-04 MED ORDER — INFLUENZA VAC SPLIT QUAD 0.5 ML IM SUSY
0.5000 mL | PREFILLED_SYRINGE | Freq: Once | INTRAMUSCULAR | Status: AC
  Administered 2020-11-04: 0.5 mL via INTRAMUSCULAR
  Filled 2020-11-04: qty 0.5

## 2020-11-04 NOTE — Patient Instructions (Signed)
[facility]   Discharge Instructions: Thank you for choosing [facility] to provide your oncology and hematology care.   If you have a lab appointment with the Cancer Center, please go directly to the Cancer Center and check in at the registration area.   Wear comfortable clothing and clothing appropriate for easy access to any Portacath or PICC line.   We strive to give you quality time with your provider. You may need to reschedule your appointment if you arrive late (15 or more minutes).  Arriving late affects you and other patients whose appointments are after yours.  Also, if you miss three or more appointments without notifying the office, you may be dismissed from the clinic at the provider's discretion.      For prescription refill requests, have your pharmacy contact our office and allow 72 hours for refills to be completed.    Today you received the following chemotherapy and/or immunotherapy agents: Pembrolizumab.      To help prevent nausea and vomiting after your treatment, we encourage you to take your nausea medication as directed.  BELOW ARE SYMPTOMS THAT SHOULD BE REPORTED IMMEDIATELY: *FEVER GREATER THAN 100.4 F (38 C) OR HIGHER *CHILLS OR SWEATING *NAUSEA AND VOMITING THAT IS NOT CONTROLLED WITH YOUR NAUSEA MEDICATION *UNUSUAL SHORTNESS OF BREATH *UNUSUAL BRUISING OR BLEEDING *URINARY PROBLEMS (pain or burning when urinating, or frequent urination) *BOWEL PROBLEMS (unusual diarrhea, constipation, pain near the anus) TENDERNESS IN MOUTH AND THROAT WITH OR WITHOUT PRESENCE OF ULCERS (sore throat, sores in mouth, or a toothache) UNUSUAL RASH, SWELLING OR PAIN  UNUSUAL VAGINAL DISCHARGE OR ITCHING   Items with * indicate a potential emergency and should be followed up as soon as possible or go to the Emergency Department if any problems should occur.  Please show the CHEMOTHERAPY ALERT CARD or IMMUNOTHERAPY ALERT CARD at  check-in to the Emergency Department and triage nurse.  Should you have questions after your visit or need to cancel or reschedule your appointment, please contact [facility]  Dept: [phone]  and follow the prompts.  Office hours are 8:00 a.m. to 4:30 p.m. Monday - Friday. Please note that voicemails left after 4:00 p.m. may not be returned until the following business day.  We are closed weekends and major holidays. You have access to a nurse at all times for urgent questions. Please call the main number to the clinic Dept: [phone] and follow the prompts.   For any non-urgent questions, you may also contact your provider using MyChart. We now offer e-Visits for anyone 18 and older to request care online for non-urgent symptoms. For details visit [email or web address].   Also download the MyChart app! Go to the app store, search "MyChart", open the app, select [facility], and log in with your MyChart username and password.  Due to Covid, a mask is required upon entering the hospital/clinic. If you do not have a mask, one will be given to you upon arrival. For doctor visits, patients may have 1 support person aged 18 or older with them. For treatment visits, patients cannot have anyone with them due to current Covid guidelines and our immunocompromised population.   

## 2020-11-05 ENCOUNTER — Encounter: Payer: 59 | Admitting: Rehabilitation

## 2020-11-16 ENCOUNTER — Other Ambulatory Visit (HOSPITAL_COMMUNITY): Payer: Self-pay

## 2020-11-18 ENCOUNTER — Encounter: Payer: 59 | Admitting: Medical-Surgical

## 2020-11-18 ENCOUNTER — Encounter: Payer: 59 | Admitting: Osteopathic Medicine

## 2020-11-19 ENCOUNTER — Other Ambulatory Visit (HOSPITAL_COMMUNITY): Payer: Self-pay

## 2020-11-19 MED ORDER — SULFAMETHOXAZOLE-TRIMETHOPRIM 800-160 MG PO TABS
ORAL_TABLET | ORAL | 0 refills | Status: DC
  Filled 2020-11-19: qty 14, 7d supply, fill #0

## 2020-11-19 NOTE — H&P (Signed)
  Subjective:     Patient ID: Sydney [facility] is a 40 y.o. female.   HPI   2.5 months post op. Scheduled for implant exchange next month.   Presented with palpable right breast mass. Diagnostic MMG/US showed right breast 2 cm span calcifications without mass. Biopsy showed IDC with DCIS, ER 5% weak, PR-, Her2 -.    MRI showed right UOQ mass 4.3 x 2.8 x 2.1 cm.  No abnormal appearing LN.   Patient had vagal response, fainted during biopsy and had to return for clip placement. Again had vasovagal response. Clip placed but additional biopsies to determine extent deferred.    Completed neoadjuvant chemotherapy. Final MRI with complete imaging response. Final pathology with complete therapeutic response, 0/3 SLN. Continues on Keytruda.   Genetics BRCA2. Mother with breast ca, had lumpectomy.   Prior 32 B, happy with this. 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 Husband is Marketing executive for Radiation [employer]. Two kids   Review of Systems      Objective:   Physical Exam Cardiovascular:     Rate and Rhythm: Normal rate and regular rhythm.     Heart sounds: Normal heart sounds.  Pulmonary:     Effort: Pulmonary effort is normal.     Breath sounds: Normal breath sounds.       Chest: bilateral chest expanded soft with faint superior pole rippling noted,  no seroma or cellulitis  SN to nipple R 19 L 19 cm Nipple to IMF R 6.5 L 6 cm   Abd: small volume for donor site Thigh: medial and lateral thighs sufficient volume for fat grafting donor site    Assessment:     Right breast cancer UOQ ER+  BRCA2 Neoadjuvant chemotherapy S/p bilateral NSM, right SLN, prepectoral TE/ADM (Alloderm) reconstruction    Plan:     Pictures today.   Plan removal TE and placement silicone implants,    Reviewed saline vs silicone, shaped v round. As in prepectoral position I recommend HCG or capacity filled silicone implants to reduce risk visible rippling.  Reviewed MRI or [place] surveillance for rupture with silicone implants. Reviewed examples for 4th generation, capacity filled 4th generation, and HCG implants vs saline implants. Reviewed BIA ALCL risk with textured implants. Reviewed size in part guided by CW. She has eleceted for silicone, plan smooth round.    Discussed purpose fat grafting to thicken flaps, reduce visible rippling. Reviewed variable take graft, may need to repeat, fat necrosis that presents as masses, abdominal incisions, pain need for compression. Patient declines this.   Reviewed OP surgery and post op limitations. No drains anticipated.   Completed [facility] physician patient checklist.   Rx for bactrim given. Has pain medication at home, prefers tylenol/motrin.   Natrelle 133S MV- 11 T 250 ml tissue expanders placed bilateral. Initial fill volume RIGHT 230 ml saline fill volume, LEFT 200 ml saline fill volume.

## 2020-11-24 ENCOUNTER — Other Ambulatory Visit: Payer: Self-pay | Admitting: *Deleted

## 2020-11-24 DIAGNOSIS — C50411 Malignant neoplasm of upper-outer quadrant of right female breast: Secondary | ICD-10-CM

## 2020-11-24 NOTE — Progress Notes (Signed)
Patient Care Team: Emeterio Reeve, DO as PCP - General (Osteopathic Medicine) [name], MD as Consulting Physician (Obstetrics and Gynecology) Mauro Kaufmann, RN as Oncology Nurse Navigator Rockwell Germany, RN as Oncology Nurse Navigator  DIAGNOSIS:    ICD-10-CM   1. Malignant neoplasm of upper-outer quadrant of right breast in female, estrogen receptor positive ([place])  C50.411    Z17.0       SUMMARY OF ONCOLOGIC HISTORY: Oncology History  Malignant neoplasm of upper outer quadrant of female breast ([place])  01/28/2020 Initial Diagnosis   Patient palpated a right breast lump. Diagnostic mammogram and US showed calcifications spanning 2.0cm in the right breast. Biopsy showed invasive and in situ carcinoma, grade 2. ER 5% weak, PR 0% negative, HER2 equiv, Ki 10%     01/28/2020 Cancer Staging   Staging form: Breast, AJCC 8th Edition - Clinical stage from 01/28/2020: Stage IIA (cT2, cN0, cM0, G2, ER+, PR-, HER2-) - Signed by Hayden Pedro, PA-C on 10/07/2020 Stage prefix: Initial diagnosis Method of lymph node assessment: Clinical    02/05/2020 - 08/18/2020 Chemotherapy    Patient is on Treatment Plan: BREAST PEMBROLIZUMAB Q21D       02/13/2020 Genetic Testing   Positive genetic testing:  A single, heterozygous, pathogenic variant was detected in the BRCA2 gene called [id number]. Testing was completed through the CustomNext-Cancer + RNAinsight panel offered by [facility]. 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 The report date is 02/13/2020.  The CustomNext-Cancer+RNAinsight panel offered by [facility] includes sequencing and rearrangement analysis for the following 47 genes:  APC, ATM, AXIN2, BARD1, BMPR1A, BRCA1, BRCA2, BRIP1, CDH1, CDK4, CDKN2A, CHEK2, DICER1, EPCAM, GREM1, HOXB13, MEN1, MLH1, MSH2, MSH3, MSH6, MUTYH, NBN, NF1, NF2, NTHL1, PALB2, PMS2, POLD1, POLE, PTEN,  RAD51C, RAD51D, RECQL, RET, SDHA, SDHAF2, SDHB, SDHC, SDHD, SMAD4, SMARCA4, STK11, TP53, TSC1, TSC2, and VHL.  RNA data is routinely analyzed for use in variant interpretation for all genes.   08/25/2020 -  Chemotherapy   Patient is on Treatment Plan : BREAST Pembrolizumab q21d       CHIEF COMPLIANT: Cycle 13 Keytruda  INTERVAL HISTORY: Sydney [facility] is a 40 y.o. with above-mentioned history of right breast cancer currently on Keytruda. She presents to the clinic today for cycle 13.  She is complaining of mild fatigue.  She is hoping to be feeling stronger than where she is at this time.  ALLERGIES:  is allergic to amoxil [amoxicillin].  MEDICATIONS:  Current Outpatient Medications  Medication Sig Dispense Refill   escitalopram (LEXAPRO) 10 MG tablet Take 1 tablet (10 mg total) by mouth daily. 90 tablet 3   lidocaine-prilocaine (EMLA) cream APPLY TO AFFECTED AREA ONCE 30 g 3   sulfamethoxazole-trimethoprim (BACTRIM DS) 800-160 MG tablet Take 1 tablet by mouth 2 times daily following surgery 14 tablet 0   No current facility-administered medications for this visit.    PHYSICAL EXAMINATION: ECOG PERFORMANCE STATUS: 1 - Symptomatic but completely ambulatory  Vitals:   11/25/20 1021  BP: 122/73  Pulse: 73  Resp: 18  Temp: (!) 97.5 F (36.4 C)  SpO2: 99%   Filed Weights   11/25/20 1021  Weight: 122 lb 8 oz (55.6 kg)    LABORATORY DATA:  I have reviewed the data as listed CMP Latest Ref Rng & Units 11/04/2020 10/14/2020 08/25/2020  Glucose 70 - 99 mg/dL 90 90 92  BUN 6 - 20 mg/dL 12 11 9  Creatinine 0.44 - 1.00 mg/dL 0.71 0.79 0.68  Sodium 135 - 145 mmol/L 138 139 138  Potassium 3.5 - 5.1 mmol/L 3.9 4.0 3.9  Chloride 98 - 111 mmol/L 103 102 105  CO2 22 - 32 mmol/L _0 Calcium 8.9 - 10.3 mg/dL 9.4 9.9 9.3  Total Protein 6.5 - 8.1 g/dL 7.6 7.7 7.3  Total Bilirubin 0.3 - 1.2 mg/dL 0.4 0.4 0.4  Alkaline Phos 38 - 126 U/L 77 72 73  AST 15 - 41 U/L _1 ALT 0 - 44  U/L _2 Lab Results  Component Value Date   WBC 4.7 11/25/2020   HGB 12.6 11/25/2020   HCT 36.6 11/25/2020   MCV 90.1 11/25/2020   PLT 195 11/25/2020   NEUTROABS 2.5 11/25/2020    ASSESSMENT & PLAN:  Malignant neoplasm of upper outer quadrant of female breast ([place]) 01/28/2020: Palpable right breast lump: Mammogram and ultrasound revealed calcifications spanning 2 cm, biopsy revealed IDC with DCIS, grade 2, 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     Treatment plan: 1. Neo- adjuvant chemotherapy with dose dense Adriamycin and Cytoxan/Keytruda followed by Taxol and carboplatin completed 08/18/2020 2. bilateral mastectomies:09/09/2020: Left mastectomy: Negative for cancer, right mastectomy: Pathologic complete response 0/3 lymph nodes negative 3. Adjuvant antiestrogen therapy (once patient undergoes hysterectomy and bilateral salpingo-oophorectomy, we will start aromatase inhibitor therapy) Based on extensive discussion back-and-forth we determined that she does not need adjuvant radiation.  _______________________________________________________________________ 09/09/2020: Left mastectomy: Negative for cancer, right mastectomy: Pathologic complete response 0/3 lymph nodes negative   Current treatment: Keytruda maintenance Keytruda toxicities: None   Cording in the axilla: Marked improvement with physical therapy with increased range of motion. Return to clinic every 3 weeks for [facility] and every 6 weeks for follow-up with me. Her last Beryle Flock will be in January 2022. After that she can have the port removed.  No orders of the defined types were placed in this encounter.  The patient has a good understanding of the overall plan. she agrees with it. she will call with any problems that may  develop before the next visit here.  Total time spent: 30 mins including face to face time and time spent for planning, charting and coordination of care  Rulon Eisenmenger, MD, MPH 11/25/2020  I, Thana Ates, am acting as scribe for Dr. Nicholas Lose.  I have reviewed the above documentation for accuracy and completeness, and I agree with the above.

## 2020-11-25 ENCOUNTER — Inpatient Hospital Stay (HOSPITAL_BASED_OUTPATIENT_CLINIC_OR_DEPARTMENT_OTHER): Payer: 59 | Admitting: Hematology and Oncology

## 2020-11-25 ENCOUNTER — Inpatient Hospital Stay: Payer: 59 | Attending: Adult Health

## 2020-11-25 ENCOUNTER — Inpatient Hospital Stay: Payer: 59

## 2020-11-25 ENCOUNTER — Other Ambulatory Visit: Payer: Self-pay

## 2020-11-25 ENCOUNTER — Encounter: Payer: Self-pay | Admitting: *Deleted

## 2020-11-25 DIAGNOSIS — C50411 Malignant neoplasm of upper-outer quadrant of right female breast: Secondary | ICD-10-CM

## 2020-11-25 DIAGNOSIS — Z5112 Encounter for antineoplastic immunotherapy: Secondary | ICD-10-CM | POA: Insufficient documentation

## 2020-11-25 DIAGNOSIS — Z880 Allergy status to penicillin: Secondary | ICD-10-CM | POA: Diagnosis not present

## 2020-11-25 DIAGNOSIS — Z170 Estrogen receptor positive status [ER+]: Secondary | ICD-10-CM

## 2020-11-25 DIAGNOSIS — Z79899 Other long term (current) drug therapy: Secondary | ICD-10-CM | POA: Diagnosis not present

## 2020-11-25 DIAGNOSIS — Z95828 Presence of other vascular implants and grafts: Secondary | ICD-10-CM

## 2020-11-25 LAB — CBC WITH DIFFERENTIAL (CANCER CENTER ONLY)
Abs Immature Granulocytes: 0.01 10*3/uL (ref 0.00–0.07)
Basophils Absolute: 0 10*3/uL (ref 0.0–0.1)
Basophils Relative: 1 %
Eosinophils Absolute: 0 10*3/uL (ref 0.0–0.5)
Eosinophils Relative: 1 %
HCT: 36.6 % (ref 36.0–46.0)
Hemoglobin: 12.6 g/dL (ref 12.0–15.0)
Immature Granulocytes: 0 %
Lymphocytes Relative: 34 %
Lymphs Abs: 1.6 10*3/uL (ref 0.7–4.0)
MCH: 31 pg (ref 26.0–34.0)
MCHC: 34.4 g/dL (ref 30.0–36.0)
MCV: 90.1 fL (ref 80.0–100.0)
Monocytes Absolute: 0.5 10*3/uL (ref 0.1–1.0)
Monocytes Relative: 11 %
Neutro Abs: 2.5 10*3/uL (ref 1.7–7.7)
Neutrophils Relative %: 53 %
Platelet Count: 195 10*3/uL (ref 150–400)
RBC: 4.06 MIL/uL (ref 3.87–5.11)
RDW: 12.2 % (ref 11.5–15.5)
WBC Count: 4.7 10*3/uL (ref 4.0–10.5)
nRBC: 0 % (ref 0.0–0.2)

## 2020-11-25 LAB — CMP (CANCER CENTER ONLY)
ALT: 21 U/L (ref 0–44)
AST: 31 U/L (ref 15–41)
Albumin: 4.4 g/dL (ref 3.5–5.0)
Alkaline Phosphatase: 77 U/L (ref 38–126)
Anion gap: 10 (ref 5–15)
BUN: 12 mg/dL (ref 6–20)
CO2: 27 mmol/L (ref 22–32)
Calcium: 9.5 mg/dL (ref 8.9–10.3)
Chloride: 103 mmol/L (ref 98–111)
Creatinine: 0.74 mg/dL (ref 0.44–1.00)
GFR, Estimated: >60 mL/min (ref >60)
Glucose, Bld: 79 mg/dL (ref 70–99)
Potassium: 4 mmol/L (ref 3.5–5.1)
Sodium: 140 mmol/L (ref 135–145)
Total Bilirubin: 0.4 mg/dL (ref 0.3–1.2)
Total Protein: 7.8 g/dL (ref 6.5–8.1)

## 2020-11-25 LAB — TSH: TSH: 0.506 u[IU]/mL (ref 0.308–3.960)

## 2020-11-25 MED ORDER — SODIUM CHLORIDE 0.9% FLUSH
10.0000 mL | INTRAVENOUS | Status: DC | PRN
  Administered 2020-11-25: 10 mL

## 2020-11-25 MED ORDER — SODIUM CHLORIDE 0.9% FLUSH
10.0000 mL | Freq: Once | INTRAVENOUS | Status: AC
  Administered 2020-11-25: 10 mL

## 2020-11-25 MED ORDER — SODIUM CHLORIDE 0.9 % IV SOLN: Freq: Once | INTRAVENOUS | Status: AC

## 2020-11-25 MED ORDER — SODIUM CHLORIDE 0.9 % IV SOLN
200.0000 mg | Freq: Once | INTRAVENOUS | Status: AC
  Administered 2020-11-25: 200 mg via INTRAVENOUS
  Filled 2020-11-25: qty 8

## 2020-11-25 MED ORDER — HEPARIN SOD (PORK) LOCK FLUSH 100 UNIT/ML IV SOLN
500.0000 [IU] | Freq: Once | INTRAVENOUS | Status: AC | PRN
  Administered 2020-11-25: 500 [IU]

## 2020-11-25 NOTE — Patient Instructions (Signed)
[facility]  Discharge Instructions: ?Thank you for choosing [facility] to provide your oncology and hematology care.  ? ?If you have a lab appointment with the Cancer Center, please go directly to the Cancer Center and check in at the registration area. ?  ?Wear comfortable clothing and clothing appropriate for easy access to any Portacath or PICC line.  ? ?We strive to give you quality time with your provider. You may need to reschedule your appointment if you arrive late (15 or more minutes).  Arriving late affects you and other patients whose appointments are after yours.  Also, if you miss three or more appointments without notifying the office, you may be dismissed from the clinic at the provider?s discretion.    ?  ?For prescription refill requests, have your pharmacy contact our office and allow 72 hours for refills to be completed.   ? ?Today you received the following chemotherapy and/or immunotherapy agents: Keytruda ?  ?To help prevent nausea and vomiting after your treatment, we encourage you to take your nausea medication as directed. ? ?BELOW ARE SYMPTOMS THAT SHOULD BE REPORTED IMMEDIATELY: ?*FEVER GREATER THAN 100.4 F (38 ?C) OR HIGHER ?*CHILLS OR SWEATING ?*NAUSEA AND VOMITING THAT IS NOT CONTROLLED WITH YOUR NAUSEA MEDICATION ?*UNUSUAL SHORTNESS OF BREATH ?*UNUSUAL BRUISING OR BLEEDING ?*URINARY PROBLEMS (pain or burning when urinating, or frequent urination) ?*BOWEL PROBLEMS (unusual diarrhea, constipation, pain near the anus) ?TENDERNESS IN MOUTH AND THROAT WITH OR WITHOUT PRESENCE OF ULCERS (sore throat, sores in mouth, or a toothache) ?UNUSUAL RASH, SWELLING OR PAIN  ?UNUSUAL VAGINAL DISCHARGE OR ITCHING  ? ?Items with * indicate a potential emergency and should be followed up as soon as possible or go to the Emergency Department if any problems should occur. ? ?Please show the CHEMOTHERAPY ALERT CARD or IMMUNOTHERAPY ALERT CARD at check-in to the  Emergency Department and triage nurse. ? ?Should you have questions after your visit or need to cancel or reschedule your appointment, please contact [facility]  Dept: [phone]  and follow the prompts.  Office hours are 8:00 a.m. to 4:30 p.m. Monday - Friday. Please note that voicemails left after 4:00 p.m. may not be returned until the following business day.  We are closed weekends and major holidays. You have access to a nurse at all times for urgent questions. Please call the main number to the clinic Dept: [phone] and follow the prompts. ? ? ?For any non-urgent questions, you may also contact your provider using MyChart. We now offer e-Visits for anyone 18 and older to request care online for non-urgent symptoms. For details visit [email or web address]. ?  ?Also download the MyChart app! Go to the app store, search "MyChart", open the app, select [facility], and log in with your MyChart username and password. ? ?Due to Covid, a mask is required upon entering the hospital/clinic. If you do not have a mask, one will be given to you upon arrival. For doctor visits, patients may have 1 support person aged 18 or older with them. For treatment visits, patients cannot have anyone with them due to current Covid guidelines and our immunocompromised population.  ? ?

## 2020-11-25 NOTE — Assessment & Plan Note (Signed)
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  Treatment plan: 1. Neo- adjuvant chemotherapy with dose dense Adriamycin and Cytoxan/Keytrudafollowed by Taxol and carboplatincompleted 08/18/2020 2.bilateral mastectomies:09/09/2020: Left mastectomy: Negative for cancer, right mastectomy: Pathologic complete response 0/3 lymph nodes negative 3.Adjuvant antiestrogen therapy (once patient undergoes hysterectomy and bilateral salpingo-oophorectomy, we will start aromatase inhibitor therapy) Based on extensive discussion back-and-forth we determined that she does not need adjuvant radiation. _______________________________________________________________________ 09/09/2020: Left mastectomy: Negative for cancer, right mastectomy: Pathologic complete response 0/3 lymph nodes negative  Current treatment: Keytruda maintenance Keytruda toxicities: None  Cording in the axilla: Marked improvement with physical therapy with increased range of motion. Return to clinic every 3 weeks for [facility] and every 6 weeks for follow-up with me.

## 2020-12-07 ENCOUNTER — Other Ambulatory Visit: Payer: Self-pay

## 2020-12-07 ENCOUNTER — Encounter (HOSPITAL_BASED_OUTPATIENT_CLINIC_OR_DEPARTMENT_OTHER): Payer: Self-pay | Admitting: Plastic Surgery

## 2020-12-10 NOTE — Progress Notes (Signed)

## 2020-12-14 ENCOUNTER — Other Ambulatory Visit: Payer: Self-pay

## 2020-12-14 ENCOUNTER — Ambulatory Visit: Payer: 59 | Attending: Hematology and Oncology | Admitting: Physical Therapy

## 2020-12-14 DIAGNOSIS — Z483 Aftercare following surgery for neoplasm: Secondary | ICD-10-CM | POA: Insufficient documentation

## 2020-12-15 ENCOUNTER — Ambulatory Visit (HOSPITAL_BASED_OUTPATIENT_CLINIC_OR_DEPARTMENT_OTHER): Payer: 59 | Admitting: Anesthesiology

## 2020-12-15 ENCOUNTER — Ambulatory Visit (HOSPITAL_BASED_OUTPATIENT_CLINIC_OR_DEPARTMENT_OTHER)
Admission: RE | Admit: 2020-12-15 | Discharge: 2020-12-15 | Disposition: A | Discharge status: Home / Self Care | Payer: 59 | Attending: Plastic Surgery | Admitting: Plastic Surgery

## 2020-12-15 ENCOUNTER — Other Ambulatory Visit: Payer: Self-pay

## 2020-12-15 ENCOUNTER — Encounter (HOSPITAL_BASED_OUTPATIENT_CLINIC_OR_DEPARTMENT_OTHER)
Admission: RE | Disposition: A | Discharge status: Home / Self Care | Payer: Self-pay | Source: Home / Self Care | Attending: Plastic Surgery

## 2020-12-15 ENCOUNTER — Encounter (HOSPITAL_BASED_OUTPATIENT_CLINIC_OR_DEPARTMENT_OTHER): Payer: Self-pay | Admitting: Plastic Surgery

## 2020-12-15 DIAGNOSIS — Z853 Personal history of malignant neoplasm of breast: Secondary | ICD-10-CM | POA: Insufficient documentation

## 2020-12-15 DIAGNOSIS — Z45812 Encounter for adjustment or removal of left breast implant: Secondary | ICD-10-CM | POA: Diagnosis not present

## 2020-12-15 DIAGNOSIS — Z1501 Genetic susceptibility to malignant neoplasm of breast: Secondary | ICD-10-CM | POA: Diagnosis not present

## 2020-12-15 DIAGNOSIS — Z803 Family history of malignant neoplasm of breast: Secondary | ICD-10-CM | POA: Diagnosis not present

## 2020-12-15 DIAGNOSIS — Z45811 Encounter for adjustment or removal of right breast implant: Secondary | ICD-10-CM | POA: Diagnosis not present

## 2020-12-15 DIAGNOSIS — Z421 Encounter for breast reconstruction following mastectomy: Secondary | ICD-10-CM | POA: Diagnosis not present

## 2020-12-15 DIAGNOSIS — Z9013 Acquired absence of bilateral breasts and nipples: Secondary | ICD-10-CM | POA: Diagnosis not present

## 2020-12-15 HISTORY — PX: REMOVAL OF BILATERAL TISSUE EXPANDERS WITH PLACEMENT OF BILATERAL BREAST IMPLANTS: SHX6431

## 2020-12-15 LAB — POCT PREGNANCY, URINE: Preg Test, Ur: NEGATIVE

## 2020-12-15 SURGERY — REMOVAL, TISSUE EXPANDER, BREAST, BILATERAL, WITH BILATERAL IMPLANT IMPLANT INSERTION
Anesthesia: General | Site: Chest | Laterality: Bilateral

## 2020-12-15 MED ORDER — SCOPOLAMINE 1 MG/3DAYS TD PT72
MEDICATED_PATCH | TRANSDERMAL | Status: AC
  Filled 2020-12-15: qty 1

## 2020-12-15 MED ORDER — EPHEDRINE SULFATE 50 MG/ML IJ SOLN
INTRAMUSCULAR | Status: DC | PRN
  Administered 2020-12-15: 15 mg via INTRAVENOUS
  Administered 2020-12-15 (×2): 10 mg via INTRAVENOUS

## 2020-12-15 MED ORDER — KETOROLAC TROMETHAMINE 30 MG/ML IJ SOLN
INTRAMUSCULAR | Status: DC | PRN
  Administered 2020-12-15: 30 mg via INTRAVENOUS

## 2020-12-15 MED ORDER — FENTANYL CITRATE (PF) 100 MCG/2ML IJ SOLN
INTRAMUSCULAR | Status: AC
  Filled 2020-12-15: qty 2

## 2020-12-15 MED ORDER — GABAPENTIN 300 MG PO CAPS
ORAL_CAPSULE | ORAL | Status: AC
  Filled 2020-12-15: qty 1

## 2020-12-15 MED ORDER — GABAPENTIN 300 MG PO CAPS
300.0000 mg | ORAL_CAPSULE | ORAL | Status: AC
  Administered 2020-12-15: 300 mg via ORAL

## 2020-12-15 MED ORDER — CHLORHEXIDINE GLUCONATE CLOTH 2 % EX PADS: 6.0000 | MEDICATED_PAD | Freq: Once | CUTANEOUS | Status: DC

## 2020-12-15 MED ORDER — ACETAMINOPHEN 500 MG PO TABS
ORAL_TABLET | ORAL | Status: AC
  Filled 2020-12-15: qty 2

## 2020-12-15 MED ORDER — OXYCODONE HCL 5 MG/5ML PO SOLN: 5.0000 mg | Freq: Once | ORAL | Status: DC | PRN

## 2020-12-15 MED ORDER — PHENYLEPHRINE 40 MCG/ML (10ML) SYRINGE FOR IV PUSH (FOR BLOOD PRESSURE SUPPORT)
PREFILLED_SYRINGE | INTRAVENOUS | Status: AC
  Filled 2020-12-15: qty 10

## 2020-12-15 MED ORDER — CELECOXIB 200 MG PO CAPS
200.0000 mg | ORAL_CAPSULE | ORAL | Status: AC
  Administered 2020-12-15: 200 mg via ORAL

## 2020-12-15 MED ORDER — FENTANYL CITRATE (PF) 100 MCG/2ML IJ SOLN
INTRAMUSCULAR | Status: DC | PRN
  Administered 2020-12-15 (×3): 50 ug via INTRAVENOUS

## 2020-12-15 MED ORDER — MIDAZOLAM HCL 5 MG/5ML IJ SOLN
INTRAMUSCULAR | Status: DC | PRN
  Administered 2020-12-15: 2 mg via INTRAVENOUS

## 2020-12-15 MED ORDER — CIPROFLOXACIN IN D5W 400 MG/200ML IV SOLN
INTRAVENOUS | Status: AC
  Filled 2020-12-15: qty 200

## 2020-12-15 MED ORDER — DROPERIDOL 2.5 MG/ML IJ SOLN: 0.6250 mg | Freq: Once | INTRAMUSCULAR | Status: DC | PRN

## 2020-12-15 MED ORDER — MIDAZOLAM HCL 2 MG/2ML IJ SOLN
INTRAMUSCULAR | Status: AC
  Filled 2020-12-15: qty 2

## 2020-12-15 MED ORDER — PROPOFOL 10 MG/ML IV BOLUS
INTRAVENOUS | Status: DC | PRN
  Administered 2020-12-15: 150 mg via INTRAVENOUS

## 2020-12-15 MED ORDER — SUCCINYLCHOLINE CHLORIDE 200 MG/10ML IV SOSY
PREFILLED_SYRINGE | INTRAVENOUS | Status: AC
  Filled 2020-12-15: qty 10

## 2020-12-15 MED ORDER — LACTATED RINGERS IV SOLN: INTRAVENOUS | Status: DC

## 2020-12-15 MED ORDER — EPHEDRINE 5 MG/ML INJ
INTRAVENOUS | Status: AC
  Filled 2020-12-15: qty 5

## 2020-12-15 MED ORDER — FENTANYL CITRATE (PF) 100 MCG/2ML IJ SOLN: 25.0000 ug | INTRAMUSCULAR | Status: DC | PRN

## 2020-12-15 MED ORDER — ONDANSETRON HCL 4 MG/2ML IJ SOLN
INTRAMUSCULAR | Status: DC | PRN
  Administered 2020-12-15: 4 mg via INTRAVENOUS

## 2020-12-15 MED ORDER — ACETAMINOPHEN 500 MG PO TABS: 1000.0000 mg | ORAL_TABLET | Freq: Once | ORAL | Status: DC

## 2020-12-15 MED ORDER — DEXAMETHASONE SODIUM PHOSPHATE 4 MG/ML IJ SOLN
INTRAMUSCULAR | Status: DC | PRN
Start: 2020-12-15 — End: 2020-12-15
  Administered 2020-12-15: 10 mg via INTRAVENOUS

## 2020-12-15 MED ORDER — SODIUM CHLORIDE 0.9 % IV SOLN
INTRAVENOUS | Status: AC
  Filled 2020-12-15: qty 10

## 2020-12-15 MED ORDER — CIPROFLOXACIN IN D5W 400 MG/200ML IV SOLN
400.0000 mg | INTRAVENOUS | Status: AC
  Administered 2020-12-15: 400 mg via INTRAVENOUS

## 2020-12-15 MED ORDER — BUPIVACAINE-EPINEPHRINE 0.25% -1:200000 IJ SOLN
INTRAMUSCULAR | Status: DC | PRN
  Administered 2020-12-15: 30 mL

## 2020-12-15 MED ORDER — ROCURONIUM BROMIDE 10 MG/ML (PF) SYRINGE
PREFILLED_SYRINGE | INTRAVENOUS | Status: AC
  Filled 2020-12-15: qty 10

## 2020-12-15 MED ORDER — DEXAMETHASONE SODIUM PHOSPHATE 10 MG/ML IJ SOLN
INTRAMUSCULAR | Status: AC
  Filled 2020-12-15: qty 1

## 2020-12-15 MED ORDER — LIDOCAINE 2% (20 MG/ML) 5 ML SYRINGE
INTRAMUSCULAR | Status: AC
  Filled 2020-12-15: qty 5

## 2020-12-15 MED ORDER — ATROPINE SULFATE 0.4 MG/ML IV SOLN
INTRAVENOUS | Status: AC
  Filled 2020-12-15: qty 1

## 2020-12-15 MED ORDER — ONDANSETRON HCL 4 MG/2ML IJ SOLN
INTRAMUSCULAR | Status: AC
  Filled 2020-12-15: qty 2

## 2020-12-15 MED ORDER — OXYCODONE HCL 5 MG PO TABS: 5.0000 mg | ORAL_TABLET | Freq: Once | ORAL | Status: DC | PRN

## 2020-12-15 MED ORDER — ACETAMINOPHEN 500 MG PO TABS
1000.0000 mg | ORAL_TABLET | ORAL | Status: AC
  Administered 2020-12-15: 1000 mg via ORAL

## 2020-12-15 MED ORDER — BUPIVACAINE-EPINEPHRINE (PF) 0.25% -1:200000 IJ SOLN
INTRAMUSCULAR | Status: AC
  Filled 2020-12-15: qty 30

## 2020-12-15 MED ORDER — SCOPOLAMINE 1 MG/3DAYS TD PT72
1.0000 | MEDICATED_PATCH | TRANSDERMAL | Status: DC
  Administered 2020-12-15: 1.5 mg via TRANSDERMAL

## 2020-12-15 MED ORDER — SODIUM CHLORIDE 0.9 % IV SOLN
INTRAVENOUS | Status: DC | PRN
  Administered 2020-12-15: 500 mL

## 2020-12-15 MED ORDER — LIDOCAINE HCL (CARDIAC) PF 100 MG/5ML IV SOSY
PREFILLED_SYRINGE | INTRAVENOUS | Status: DC | PRN
  Administered 2020-12-15: 80 mg via INTRAVENOUS

## 2020-12-15 MED ORDER — CELECOXIB 200 MG PO CAPS
ORAL_CAPSULE | ORAL | Status: AC
  Filled 2020-12-15: qty 1

## 2020-12-15 MED ORDER — PROMETHAZINE HCL 25 MG/ML IJ SOLN: 6.2500 mg | INTRAMUSCULAR | Status: DC | PRN

## 2020-12-15 SURGICAL SUPPLY — 65 items
ADH SKN CLS APL DERMABOND .7 (GAUZE/BANDAGES/DRESSINGS) ×2
APL PRP STRL LF DISP 70% ISPRP (MISCELLANEOUS) ×2
BAG DECANTER FOR FLEXI CONT (MISCELLANEOUS) ×2 IMPLANT
BINDER BREAST 3XL (GAUZE/BANDAGES/DRESSINGS) IMPLANT
BINDER BREAST LRG (GAUZE/BANDAGES/DRESSINGS) IMPLANT
BINDER BREAST MEDIUM (GAUZE/BANDAGES/DRESSINGS) ×2 IMPLANT
BINDER BREAST XLRG (GAUZE/BANDAGES/DRESSINGS) IMPLANT
BINDER BREAST XXLRG (GAUZE/BANDAGES/DRESSINGS) IMPLANT
BLADE SURG 10 STRL SS (BLADE) ×4 IMPLANT
BNDG GAUZE ELAST 4 BULKY (GAUZE/BANDAGES/DRESSINGS) ×4 IMPLANT
CANISTER SUCT 1200ML W/VALVE (MISCELLANEOUS) ×2 IMPLANT
CHLORAPREP W/TINT 26 (MISCELLANEOUS) ×4 IMPLANT
COVER BACK TABLE 60X90IN (DRAPES) ×2 IMPLANT
COVER MAYO STAND STRL (DRAPES) ×2 IMPLANT
DECANTER SPIKE VIAL GLASS SM (MISCELLANEOUS) IMPLANT
DERMABOND ADVANCED (GAUZE/BANDAGES/DRESSINGS) ×2
DERMABOND ADVANCED .7 DNX12 (GAUZE/BANDAGES/DRESSINGS) ×2 IMPLANT
DRAIN CHANNEL 15F RND FF W/TCR (WOUND CARE) IMPLANT
DRAPE TOP ARMCOVERS (MISCELLANEOUS) ×2 IMPLANT
DRAPE U-SHAPE 76X120 STRL (DRAPES) ×2 IMPLANT
DRAPE UTILITY XL STRL (DRAPES) ×2 IMPLANT
DRSG PAD ABDOMINAL 8X10 ST (GAUZE/BANDAGES/DRESSINGS) ×4 IMPLANT
ELECT BLADE 4.0 EZ CLEAN MEGAD (MISCELLANEOUS)
ELECT COATED BLADE 2.86 ST (ELECTRODE) ×2 IMPLANT
ELECT REM PT RETURN 9FT ADLT (ELECTROSURGICAL) ×2
ELECTRODE BLDE 4.0 EZ CLN MEGD (MISCELLANEOUS) IMPLANT
ELECTRODE REM PT RTRN 9FT ADLT (ELECTROSURGICAL) ×1 IMPLANT
EVACUATOR SILICONE 100CC (DRAIN) IMPLANT
GLOVE SURG HYDRASOFT LTX SZ5.5 (GLOVE) ×4 IMPLANT
GLOVE SURG UNDER POLY LF SZ7 (GLOVE) ×4 IMPLANT
GOWN STRL REUS W/ TWL LRG LVL3 (GOWN DISPOSABLE) ×2 IMPLANT
GOWN STRL REUS W/TWL LRG LVL3 (GOWN DISPOSABLE) ×4
IMPL BREAST GEL 295 (Breast) ×2 IMPLANT
IMPLANT BREAST GEL 295 (Breast) ×4 IMPLANT
KIT FILL SYSTEM UNIVERSAL (SET/KITS/TRAYS/PACK) IMPLANT
MARKER SKIN DUAL TIP RULER LAB (MISCELLANEOUS) IMPLANT
NEEDLE FILTER BLUNT 18X 1/2SAF (NEEDLE) ×1
NEEDLE FILTER BLUNT 18X1 1/2 (NEEDLE) ×1 IMPLANT
NEEDLE HYPO 25X1 1.5 SAFETY (NEEDLE) ×2 IMPLANT
PACK BASIN DAY SURGERY FS (CUSTOM PROCEDURE TRAY) ×2 IMPLANT
PENCIL SMOKE EVACUATOR (MISCELLANEOUS) ×2 IMPLANT
PIN SAFETY STERILE (MISCELLANEOUS) IMPLANT
SHEET MEDIUM DRAPE 40X70 STRL (DRAPES) ×2 IMPLANT
SIZER BREAST 265CC (SIZER) ×2
SIZER BREAST 295CC (SIZER) ×2
SIZER BRST P4.4X 265CC (SIZER) ×1 IMPLANT
SIZER BRST P4.5X 295CC (SIZER) ×1 IMPLANT
SLEEVE SCD COMPRESS KNEE MED (STOCKING) ×2 IMPLANT
SPONGE T-LAP 18X18 ~~LOC~~+RFID (SPONGE) ×2 IMPLANT
STAPLER VISISTAT 35W (STAPLE) ×2 IMPLANT
SUT ETHILON 2 0 FS 18 (SUTURE) IMPLANT
SUT MNCRL AB 4-0 PS2 18 (SUTURE) ×2 IMPLANT
SUT PDS AB 2-0 CT2 27 (SUTURE) IMPLANT
SUT VIC AB 3-0 PS1 18 (SUTURE)
SUT VIC AB 3-0 PS1 18XBRD (SUTURE) IMPLANT
SUT VIC AB 3-0 SH 27 (SUTURE) ×4
SUT VIC AB 3-0 SH 27X BRD (SUTURE) ×2 IMPLANT
SUT VICRYL 4-0 PS2 18IN ABS (SUTURE) ×2 IMPLANT
SYR 20ML LL LF (SYRINGE) IMPLANT
SYR BULB IRRIG 60ML STRL (SYRINGE) ×2 IMPLANT
SYR CONTROL 10ML LL (SYRINGE) ×2 IMPLANT
TOWEL GREEN STERILE FF (TOWEL DISPOSABLE) ×4 IMPLANT
TUBE CONNECTING 20X1/4 (TUBING) ×2 IMPLANT
UNDERPAD 30X36 HEAVY ABSORB (UNDERPADS AND DIAPERS) ×4 IMPLANT
YANKAUER SUCT BULB TIP NO VENT (SUCTIONS) ×2 IMPLANT

## 2020-12-15 NOTE — Discharge Instructions (Signed)

## 2020-12-15 NOTE — Anesthesia Preprocedure Evaluation (Signed)
Anesthesia Evaluation  Patient identified by MRN, date of birth, ID band Patient awake    Reviewed: Allergy & Precautions, H&P , NPO status , Patient's Chart, lab work & pertinent test results  Airway Mallampati: II  TM Distance: >3 FB Neck ROM: Full    Dental no notable dental hx.    Pulmonary neg pulmonary ROS,    Pulmonary exam normal breath sounds clear to auscultation       Cardiovascular negative cardio ROS Normal cardiovascular exam Rhythm:Regular Rate:Normal     Neuro/Psych  Headaches, negative psych ROS   GI/Hepatic negative GI ROS, Neg liver ROS,   Endo/Other  negative endocrine ROS  Renal/GU negative Renal ROS  negative genitourinary   Musculoskeletal negative musculoskeletal ROS (+)   Abdominal   Peds negative pediatric ROS (+)  Hematology negative hematology ROS (+)   Anesthesia Other Findings Breast cancer  Reproductive/Obstetrics negative OB ROS                             Anesthesia Physical Anesthesia Plan  ASA: 2  Anesthesia Plan: General   Post-op Pain Management: Tylenol PO (pre-op) and Toradol IV (intra-op)   Induction: Intravenous  PONV Risk Score and Plan: 3 and Scopolamine patch - Pre-op, Treatment may vary due to age or medical condition, Dexamethasone, Midazolam and Ondansetron  Airway Management Planned: LMA  Additional Equipment:   Intra-op Plan:   Post-operative Plan: Extubation in OR  Informed Consent: I have reviewed the patients History and Physical, chart, labs and discussed the procedure including the risks, benefits and alternatives for the proposed anesthesia with the patient or authorized representative who has indicated his/her understanding and acceptance.     Dental advisory given  Plan Discussed with: CRNA  Anesthesia Plan Comments:         Anesthesia Quick Evaluation

## 2020-12-15 NOTE — Anesthesia Procedure Notes (Signed)
Procedure Name: LMA Insertion Date/Time: 12/15/2020 7:38 AM Performed by: Willa Frater, CRNA Pre-anesthesia Checklist: Patient identified, Emergency Drugs available, Suction available and Patient being monitored Patient Re-evaluated:Patient Re-evaluated prior to induction Oxygen Delivery Method: Circle system utilized Preoxygenation: Pre-oxygenation with 100% oxygen Induction Type: IV induction Ventilation: Mask ventilation without difficulty LMA: LMA inserted LMA Size: 3.0 Number of attempts: 1 Airway Equipment and Method: Bite block Placement Confirmation: positive ETCO2 Tube secured with: Tape Dental Injury: Teeth and Oropharynx as per pre-operative assessment

## 2020-12-15 NOTE — Interval H&P Note (Signed)
History and Physical Interval Note:  12/15/2020 7:03 AM  Sydney Herman  has presented today for surgery, with the diagnosis of right breast ca, UOQ ERpositive, BRCA2, acquired absence breasts.  The various methods of treatment have been discussed with the patient and family. After consideration of risks, benefits and other options for treatment, the patient has consented to  Procedure(s): REMOVAL OF BILATERAL TISSUE EXPANDERS WITH PLACEMENT OF BILATERAL BREAST SILICONE IMPLANTS (Bilateral) as a surgical intervention.  The patient's history has been reviewed, patient examined, no change in status, stable for surgery.  I have reviewed the patient's chart and labs.  Questions were answered to the patient's satisfaction.     Arnoldo Hooker Dorthula Bier

## 2020-12-15 NOTE — Op Note (Signed)
Operative Note   DATE OF OPERATION: 12.6.22  LOCATION: [facility]-outpatient  SURGICAL DIVISION: Plastic Surgery  PREOPERATIVE DIAGNOSES:  1. History breast cancer 2. Acquired absence breasts 3. BRCA2  POSTOPERATIVE DIAGNOSES:  same  PROCEDURE:  Removal bilateral chest tissue expanders and placement silicone implants  SURGEON: Irene Limbo MD MBA  ASSISTANT: none  ANESTHESIA:  General.   EBL: 15 ml  COMPLICATIONS: None immediate.   INDICATIONS FOR PROCEDURE:  The patient, Sydney Herman, is a 40 y.o. female born on [birth year], is here for staged breast reconstruction following nipple sparing mastectomies with immediate prepectoral tissue expander acellular dermis reconstruction.    FINDINGS: Complete incorporation ADM noted bilateral. Natrelle Soft Touch smooth round Full Profile 295 ml implants placed bilateral. REF [id number] RIGHT SN [id number] LEFT [id number]  DESCRIPTION OF PROCEDURE:  The patient's operative site was marked in the preoperative area. The patient was taken to the operating room. SCDs were placed and IV antibiotics were given. The patient's operative site was prepped and draped in a sterile fashion. A time out was performed and all information was confirmed to be correct. I began on left side. Incision made through prior inframammary fold scar and carried through superficial fascia to acellular demis. ADM incised. Expander removed. Full incorporation ADM noted. Sizer placed.   I then directed attention to right chest. Incision made in prior inframammary fold scar and implant cavity entered in similar manner. Expander removed and well incorporated ADM noted. Sizer placed. Patient brought to upright sitting position. Natrelle Smooth Round Full Projection 295 ml implant selected for bilateral placement. Patient returned to supine position.   Each cavity irrigated with saline solution containing Betadine, Ancef, gentamicin solution. Hemostasis ensured.The  implant was placed in left chest and implant orientation ensured. Closure completed with 3-0 vicryl to close superficial fascia and ADM over implant. 4-0 vicryl used to close dermis followed by 4-0 monocryl subcuticular. Implant placed in right chest cavity. Closure completed in similar fashion. Dermabond applied to chest incisions. Dry dressing applied, followed by breast binder.  The patient was allowed to wake from anesthesia, extubated and taken to the recovery room in satisfactory condition.   SPECIMENS: none  DRAINS: none

## 2020-12-15 NOTE — Anesthesia Postprocedure Evaluation (Signed)
Anesthesia Post Note  Patient: Sydney Herman  Procedure(s) Performed: REMOVAL OF BILATERAL TISSUE EXPANDERS WITH PLACEMENT OF BILATERAL BREAST SILICONE IMPLANTS (Bilateral: Chest)     Patient location during evaluation: PACU Anesthesia Type: General Level of consciousness: awake and alert Pain management: pain level controlled Vital Signs Assessment: post-procedure vital signs reviewed and stable Respiratory status: spontaneous breathing, nonlabored ventilation and respiratory function stable Cardiovascular status: blood pressure returned to baseline and stable Postop Assessment: no apparent nausea or vomiting Anesthetic complications: no   No notable events documented.  Last Vitals:  Vitals:   12/15/20 0917 12/15/20 0928  BP: 116/70 117/74  Pulse: (!) 110 (!) 104  Resp: 18 17  Temp:    SpO2: 100% 98%    Last Pain:  Vitals:   12/15/20 0928  TempSrc:   PainSc: 0-No pain                 Merlinda Frederick

## 2020-12-15 NOTE — Transfer of Care (Signed)
Immediate Anesthesia Transfer of Care Note  Patient: Sydney Herman  Procedure(s) Performed: REMOVAL OF BILATERAL TISSUE EXPANDERS WITH PLACEMENT OF BILATERAL BREAST SILICONE IMPLANTS (Bilateral: Chest)  Patient Location: PACU  Anesthesia Type:General  Level of Consciousness: awake, alert , oriented and drowsy  Airway & Oxygen Therapy: Patient Spontanous Breathing and Patient connected to face mask oxygen  Post-op Assessment: Report given to RN and Post -op Vital signs reviewed and stable  Post vital signs: Reviewed and stable  Last Vitals:  Vitals Value Taken Time  BP    Temp    Pulse    Resp    SpO2      Last Pain:  Vitals:   12/15/20 0636  TempSrc: Oral  PainSc: 0-No pain      Patients Stated Pain Goal: 4 (93/57/01 7793)  Complications: No notable events documented.

## 2020-12-16 ENCOUNTER — Other Ambulatory Visit: Payer: 59

## 2020-12-16 ENCOUNTER — Encounter (HOSPITAL_BASED_OUTPATIENT_CLINIC_OR_DEPARTMENT_OTHER): Payer: Self-pay | Admitting: Plastic Surgery

## 2020-12-16 ENCOUNTER — Ambulatory Visit: Payer: 59

## 2020-12-16 NOTE — Therapy (Signed)
[facility] [address] [place], [place], [address] Phone: [phone]   Fax:  [phone]  Physical Therapy Treatment  Patient Details  Name: Sydney Herman MRN: [id number] Date of Birth: 01/01/1981 Referring Provider (PT): Dr. Vinay Gudena   Encounter Date: 12/14/2020   PT End of Session - 12/16/20 1320     Visit Number 7    Number of Visits 10    PT Start Time 1010    PT Stop Time 1025    PT Time Calculation (min) 15 min    Activity Tolerance Patient tolerated treatment well    Behavior During Therapy WFL for tasks assessed/performed             Past Medical History:  Diagnosis Date   Abdominal wall mass of right lower quadrant 05/17/2013   Saw Dr. Tsuei 2015- was thought to be scar tissue- no growth since that time    Breast cancer (HCC) 01/23/2020   Right   Chicken pox    Family history of esophageal cancer    Family history of prostate cancer    Family history of uterine cancer    Generalized headaches    Has had some migraines in past as well. once a week or less. Usually occipital and associated with sinus pressure as well.    Heartburn in pregnancy     Past Surgical History:  Procedure Laterality Date   BREAST RECONSTRUCTION WITH PLACEMENT OF TISSUE EXPANDER AND ALLODERM Bilateral 09/09/2020   Procedure: BILATERAL BREAST RECONSTRUCTION WITH PLACEMENT OF TISSUE EXPANDER AND ALLODERM;  Surgeon: Thimmappa, Brinda, MD;  Location: [facility];  Service: Plastics;  Laterality: Bilateral;   CESAREAN SECTION  07/2010   [facility]   CESAREAN SECTION N/A 12/27/2012   [facility]    NIPPLE SPARING MASTECTOMY Left 09/09/2020   Procedure: LEFT NIPPLE SPARING MASTECTOMY;  Surgeon: Wakefield, Matthew, MD;  Location: [facility];  Service: General;  Laterality: Left;   NIPPLE SPARING MASTECTOMY WITH SENTINEL LYMPH NODE BIOPSY Right 09/09/2020   Procedure: RIGHT NIPPLE SPARING MASTECTOMY  WITH RIGHT AXILLARY SENTINEL LYMPH NODE BIOPSY;  Surgeon: Wakefield, Matthew, MD;  Location: [facility];  Service: General;  Laterality: Right;   PORTACATH PLACEMENT Right 02/04/2020   Procedure: INSERTION PORT-A-CATH WITH ULTRASOUND;  Surgeon: Wakefield, Matthew, MD;  Location: [facility];  Service: General;  Laterality: Right;   REMOVAL OF BILATERAL TISSUE EXPANDERS WITH PLACEMENT OF BILATERAL BREAST IMPLANTS Bilateral 12/15/2020   Procedure: REMOVAL OF BILATERAL TISSUE EXPANDERS WITH PLACEMENT OF BILATERAL BREAST SILICONE IMPLANTS;  Surgeon: Thimmappa, Brinda, MD;  Location: [facility];  Service: Plastics;  Laterality: Bilateral;   WISDOM TOOTH EXTRACTION      There were no vitals filed for this visit.   Subjective Assessment - 12/16/20 1320     Subjective Pt is here for her SOZO screen                    L-DEX FLOWSHEETS - 12/16/20 1300       L-DEX LYMPHEDEMA SCREENING   Measurement Type Unilateral    L-DEX MEASUREMENT EXTREMITY Upper Extremity    POSITION  Standing    DOMINANT SIDE Right    At Risk Side Right    BASELINE SCORE (UNILATERAL) -2.8    L-DEX SCORE (UNILATERAL) -3.4    VALUE CHANGE (UNILAT) -0.6                                       PT Long Term Goals - 10/19/20 1051       PT LONG TERM GOAL #1   Title Patient will demonstrate she has regained full shoulder ROM and function post operatively compared to baselines.    Status Achieved      PT LONG TERM GOAL #2   Title Patient will increase right shoulder flexion to 150 degrees for increased ease reaching overhead.    Status Achieved      PT LONG TERM GOAL #3   Title Patient will increase right shoulder abduction to 160 degrees for ability to obtain radiation positioning.    Status Achieved      PT LONG TERM GOAL #4   Title Patient will improve her DASH score to be </= 8 for improved overall UE function.    Status Achieved       PT LONG TERM GOAL #5   Title Patient will report good understanding of lymphedema risk reduction practices.    Status Achieved                   Plan - 12/16/20 1320     Clinical Impression Statement Patient has not increased on her SOZO screen so no signs of lymphedema at this time.    PT Next Visit Plan Continue SOZO screens every 3 months    Consulted and Agree with Plan of Care Patient             Patient will benefit from skilled therapeutic intervention in order to improve the following deficits and impairments:     Visit Diagnosis: Aftercare following surgery for neoplasm     Problem List Patient Active Problem List   Diagnosis Date Noted   Breast cancer, right (HCC) 09/09/2020   Malignant tumor of breast (HCC) 05/12/2020   Port-A-Cath in place 03/19/2020   BRCA2 gene mutation positive 02/21/2020   Genetic testing 02/13/2020   Family history of prostate cancer    Family history of uterine cancer    Family history of esophageal cancer    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   History of abnormal cervical Pap smear 11/16/2018   Family history of type 2 diabetes mellitus in father 11/16/2018   Family history of breast cancer in mother 11/16/2018   Mild hyperlipidemia 11/02/2017   Vitamin B12 deficiency 02/20/2017   Generalized headaches    LGSIL (low grade squamous intraepithelial lesion) on Pap smear 10/02/2011   Marti Cooper Smith, PT 12/16/20 1:21 PM   [facility] [address] [place], [place], [address] Phone: [phone]   Fax:  [phone]  Name: Sydney Herman MRN: [id number] Date of Birth: 05/06/1980    

## 2020-12-23 ENCOUNTER — Other Ambulatory Visit: Payer: Self-pay

## 2020-12-23 ENCOUNTER — Ambulatory Visit: Payer: 59

## 2020-12-23 ENCOUNTER — Inpatient Hospital Stay: Payer: 59

## 2020-12-23 ENCOUNTER — Other Ambulatory Visit: Payer: 59

## 2020-12-23 ENCOUNTER — Inpatient Hospital Stay: Payer: 59 | Attending: Adult Health

## 2020-12-23 VITALS — BP 106/78 | HR 66 | Temp 98.5°F | Resp 18 | Wt 122.2 lb

## 2020-12-23 DIAGNOSIS — Z95828 Presence of other vascular implants and grafts: Secondary | ICD-10-CM

## 2020-12-23 DIAGNOSIS — Z170 Estrogen receptor positive status [ER+]: Secondary | ICD-10-CM

## 2020-12-23 DIAGNOSIS — C50411 Malignant neoplasm of upper-outer quadrant of right female breast: Secondary | ICD-10-CM | POA: Diagnosis not present

## 2020-12-23 DIAGNOSIS — Z5112 Encounter for antineoplastic immunotherapy: Secondary | ICD-10-CM | POA: Insufficient documentation

## 2020-12-23 LAB — CMP (CANCER CENTER ONLY)
ALT: 16 U/L (ref 0–44)
AST: 25 U/L (ref 15–41)
Albumin: 4.1 g/dL (ref 3.5–5.0)
Alkaline Phosphatase: 83 U/L (ref 38–126)
Anion gap: 8 (ref 5–15)
BUN: 10 mg/dL (ref 6–20)
CO2: 26 mmol/L (ref 22–32)
Calcium: 9 mg/dL (ref 8.9–10.3)
Chloride: 102 mmol/L (ref 98–111)
Creatinine: 0.8 mg/dL (ref 0.44–1.00)
GFR, Estimated: >60 mL/min (ref >60)
Glucose, Bld: 146 mg/dL — ABNORMAL HIGH (ref 70–99)
Potassium: 3.9 mmol/L (ref 3.5–5.1)
Sodium: 136 mmol/L (ref 135–145)
Total Bilirubin: 0.4 mg/dL (ref 0.3–1.2)
Total Protein: 7.3 g/dL (ref 6.5–8.1)

## 2020-12-23 LAB — CBC WITH DIFFERENTIAL (CANCER CENTER ONLY)
Abs Immature Granulocytes: 0.01 10*3/uL (ref 0.00–0.07)
Basophils Absolute: 0 10*3/uL (ref 0.0–0.1)
Basophils Relative: 1 %
Eosinophils Absolute: 0.1 10*3/uL (ref 0.0–0.5)
Eosinophils Relative: 2 %
HCT: 35.7 % — ABNORMAL LOW (ref 36.0–46.0)
Hemoglobin: 12 g/dL (ref 12.0–15.0)
Immature Granulocytes: 0 %
Lymphocytes Relative: 32 %
Lymphs Abs: 1.4 10*3/uL (ref 0.7–4.0)
MCH: 29.6 pg (ref 26.0–34.0)
MCHC: 33.6 g/dL (ref 30.0–36.0)
MCV: 87.9 fL (ref 80.0–100.0)
Monocytes Absolute: 0.4 10*3/uL (ref 0.1–1.0)
Monocytes Relative: 10 %
Neutro Abs: 2.5 10*3/uL (ref 1.7–7.7)
Neutrophils Relative %: 55 %
Platelet Count: 197 10*3/uL (ref 150–400)
RBC: 4.06 MIL/uL (ref 3.87–5.11)
RDW: 12.6 % (ref 11.5–15.5)
WBC Count: 4.5 10*3/uL (ref 4.0–10.5)
nRBC: 0 % (ref 0.0–0.2)

## 2020-12-23 LAB — TSH: TSH: 0.352 u[IU]/mL (ref 0.308–3.960)

## 2020-12-23 MED ORDER — HEPARIN SOD (PORK) LOCK FLUSH 100 UNIT/ML IV SOLN
500.0000 [IU] | Freq: Once | INTRAVENOUS | Status: AC | PRN
  Administered 2020-12-23: 12:00:00 500 [IU]

## 2020-12-23 MED ORDER — SODIUM CHLORIDE 0.9 % IV SOLN
200.0000 mg | Freq: Once | INTRAVENOUS | Status: AC
  Administered 2020-12-23: 12:00:00 200 mg via INTRAVENOUS
  Filled 2020-12-23: qty 8

## 2020-12-23 MED ORDER — SODIUM CHLORIDE 0.9% FLUSH
10.0000 mL | INTRAVENOUS | Status: DC | PRN
  Administered 2020-12-23: 12:00:00 10 mL

## 2020-12-23 MED ORDER — SODIUM CHLORIDE 0.9 % IV SOLN: Freq: Once | INTRAVENOUS | Status: AC

## 2020-12-23 MED ORDER — SODIUM CHLORIDE 0.9% FLUSH
10.0000 mL | Freq: Once | INTRAVENOUS | Status: AC
  Administered 2020-12-23: 10:00:00 10 mL

## 2020-12-23 NOTE — Patient Instructions (Signed)
[facility]  Discharge Instructions: ?Thank you for choosing [facility] to provide your oncology and hematology care.  ? ?If you have a lab appointment with the Cancer Center, please go directly to the Cancer Center and check in at the registration area. ?  ?Wear comfortable clothing and clothing appropriate for easy access to any Portacath or PICC line.  ? ?We strive to give you quality time with your provider. You may need to reschedule your appointment if you arrive late (15 or more minutes).  Arriving late affects you and other patients whose appointments are after yours.  Also, if you miss three or more appointments without notifying the office, you may be dismissed from the clinic at the provider?s discretion.    ?  ?For prescription refill requests, have your pharmacy contact our office and allow 72 hours for refills to be completed.   ? ?Today you received the following chemotherapy and/or immunotherapy agents: Keytruda ?  ?To help prevent nausea and vomiting after your treatment, we encourage you to take your nausea medication as directed. ? ?BELOW ARE SYMPTOMS THAT SHOULD BE REPORTED IMMEDIATELY: ?*FEVER GREATER THAN 100.4 F (38 ?C) OR HIGHER ?*CHILLS OR SWEATING ?*NAUSEA AND VOMITING THAT IS NOT CONTROLLED WITH YOUR NAUSEA MEDICATION ?*UNUSUAL SHORTNESS OF BREATH ?*UNUSUAL BRUISING OR BLEEDING ?*URINARY PROBLEMS (pain or burning when urinating, or frequent urination) ?*BOWEL PROBLEMS (unusual diarrhea, constipation, pain near the anus) ?TENDERNESS IN MOUTH AND THROAT WITH OR WITHOUT PRESENCE OF ULCERS (sore throat, sores in mouth, or a toothache) ?UNUSUAL RASH, SWELLING OR PAIN  ?UNUSUAL VAGINAL DISCHARGE OR ITCHING  ? ?Items with * indicate a potential emergency and should be followed up as soon as possible or go to the Emergency Department if any problems should occur. ? ?Please show the CHEMOTHERAPY ALERT CARD or IMMUNOTHERAPY ALERT CARD at check-in to the  Emergency Department and triage nurse. ? ?Should you have questions after your visit or need to cancel or reschedule your appointment, please contact [facility]  Dept: [phone]  and follow the prompts.  Office hours are 8:00 a.m. to 4:30 p.m. Monday - Friday. Please note that voicemails left after 4:00 p.m. may not be returned until the following business day.  We are closed weekends and major holidays. You have access to a nurse at all times for urgent questions. Please call the main number to the clinic Dept: [phone] and follow the prompts. ? ? ?For any non-urgent questions, you may also contact your provider using MyChart. We now offer e-Visits for anyone 18 and older to request care online for non-urgent symptoms. For details visit [email or web address]. ?  ?Also download the MyChart app! Go to the app store, search "MyChart", open the app, select [place], and log in with your MyChart username and password. ? ?Due to Covid, a mask is required upon entering the hospital/clinic. If you do not have a mask, one will be given to you upon arrival. For doctor visits, patients may have 1 support person aged 18 or older with them. For treatment visits, patients cannot have anyone with them due to current Covid guidelines and our immunocompromised population.  ? ?

## 2020-12-25 ENCOUNTER — Other Ambulatory Visit (HOSPITAL_COMMUNITY): Payer: Self-pay

## 2020-12-25 ENCOUNTER — Other Ambulatory Visit: Payer: Self-pay

## 2020-12-25 MED ORDER — CYCLOSPORINE 0.05 % OP EMUL
1.0000 [drp] | Freq: Two times a day (BID) | OPHTHALMIC | 4 refills | Status: AC
  Filled 2020-12-25: qty 180, 90d supply, fill #0

## 2020-12-27 ENCOUNTER — Other Ambulatory Visit: Payer: Self-pay

## 2020-12-28 ENCOUNTER — Other Ambulatory Visit (HOSPITAL_COMMUNITY): Payer: Self-pay

## 2021-01-06 ENCOUNTER — Other Ambulatory Visit: Payer: 59

## 2021-01-06 ENCOUNTER — Ambulatory Visit: Payer: 59 | Admitting: Hematology and Oncology

## 2021-01-06 ENCOUNTER — Ambulatory Visit: Payer: 59

## 2021-01-12 NOTE — Progress Notes (Signed)
Patient Care Team: Emeterio Reeve, DO as PCP - General (Osteopathic Medicine) [name], MD as Consulting Physician (Obstetrics and Gynecology) Mauro Kaufmann, RN as Oncology Nurse Navigator Rockwell Germany, RN as Oncology Nurse Navigator  DIAGNOSIS:    ICD-10-CM   1. Malignant neoplasm of upper-outer quadrant of right breast in female, estrogen receptor positive ([place])  C50.411    Z17.0       SUMMARY OF ONCOLOGIC HISTORY: Oncology History  Malignant neoplasm of upper outer quadrant of female breast ([place])  01/28/2020 Initial Diagnosis   Patient palpated a right breast lump. Diagnostic mammogram and US showed calcifications spanning 2.0cm in the right breast. Biopsy showed invasive and in situ carcinoma, grade 2. ER 5% weak, PR 0% negative, HER2 equiv, Ki 10%     01/28/2020 Cancer Staging   Staging form: Breast, AJCC 8th Edition - Clinical stage from 01/28/2020: Stage IIA (cT2, cN0, cM0, G2, ER+, PR-, HER2-) - Signed by Hayden Pedro, PA-C on 10/07/2020 Stage prefix: Initial diagnosis Method of lymph node assessment: Clinical    02/05/2020 - 08/18/2020 Chemotherapy    Patient is on Treatment Plan: BREAST PEMBROLIZUMAB Q21D       02/13/2020 Genetic Testing   Positive genetic testing:  A single, heterozygous, pathogenic variant was detected in the BRCA2 gene called c.[id number]. Testing was completed through the CustomNext-Cancer + RNAinsight panel offered by [facility]. 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 The report date is 02/13/2020.  The CustomNext-Cancer+RNAinsight panel offered by [facility] includes sequencing and rearrangement analysis for the following 47 genes:  APC, ATM, AXIN2, BARD1, BMPR1A, BRCA1, BRCA2, BRIP1, CDH1, CDK4, CDKN2A, CHEK2, DICER1, EPCAM, GREM1, HOXB13, MEN1, MLH1, MSH2, MSH3, MSH6, MUTYH, NBN, NF1, NF2, NTHL1, PALB2, PMS2, POLD1, POLE, PTEN,  RAD51C, RAD51D, RECQL, RET, SDHA, SDHAF2, SDHB, SDHC, SDHD, SMAD4, SMARCA4, STK11, TP53, TSC1, TSC2, and VHL.  RNA data is routinely analyzed for use in variant interpretation for all genes.   08/25/2020 -  Chemotherapy   Patient is on Treatment Plan : BREAST Pembrolizumab q21d       CHIEF COMPLIANT: Cycle 15 Keytruda  INTERVAL HISTORY: Sydney Herman is a 41 y.o. with above-mentioned history of right breast cancer currently on Keytruda. She presents to the clinic today for cycle 15.  Other than mild aches and pains and fatigue she has [facility] well [facility].  She has 1 more treatment left.  She is planning to undergo hysterectomy and bilateral salpingo-oophorectomy on 02/09/2021.  ALLERGIES:  is allergic to amoxil [amoxicillin].  MEDICATIONS:  Current Outpatient Medications  Medication Sig Dispense Refill   cycloSPORINE (RESTASIS) 0.05 % ophthalmic emulsion Instill 1 drop into both eyes twice a day. 180 each 4   escitalopram (LEXAPRO) 10 MG tablet Take 1 tablet (10 mg total) by mouth daily. 90 tablet 3   lidocaine-prilocaine (EMLA) cream APPLY TO AFFECTED AREA ONCE 30 g 3   sulfamethoxazole-trimethoprim (BACTRIM DS) 800-160 MG tablet Take 1 tablet by mouth 2 times daily following surgery 14 tablet 0   No current facility-administered medications for this visit.    PHYSICAL EXAMINATION: ECOG PERFORMANCE STATUS: 1 - Symptomatic but completely ambulatory  Vitals:   01/13/21 1022  BP: 111/77  Pulse: 76  Resp: 16  Temp: 97.7 F (36.5 C)  SpO2: 100%   Filed Weights   01/13/21 1022  Weight: 119 lb 14.4 oz (54.4 kg)     LABORATORY DATA:  I have reviewed  the data as listed CMP Latest Ref Rng & Units 12/23/2020 11/25/2020 11/04/2020  Glucose 70 - 99 mg/dL 146(H) 79 90  BUN 6 - 20 mg/dL '10 12 12  ' Creatinine 0.44 - 1.00 mg/dL 0.80 0.74 0.71  Sodium 135 - 145 mmol/L 136 140 138  Potassium 3.5 - 5.1 mmol/L 3.9 4.0 3.9  Chloride 98 - 111 mmol/L 102 103 103  CO2 22 - 32 mmol/L '26  27 25  ' Calcium 8.9 - 10.3 mg/dL 9.0 9.5 9.4  Total Protein 6.5 - 8.1 g/dL 7.3 7.8 7.6  Total Bilirubin 0.3 - 1.2 mg/dL 0.4 0.4 0.4  Alkaline Phos 38 - 126 U/L 83 77 77  AST 15 - 41 U/L '25 31 25  ' ALT 0 - 44 U/L '16 21 14    ' Lab Results  Component Value Date   WBC 4.5 12/23/2020   HGB 12.0 12/23/2020   HCT 35.7 (L) 12/23/2020   MCV 87.9 12/23/2020   PLT 197 12/23/2020   NEUTROABS 2.5 12/23/2020    ASSESSMENT & PLAN:  Malignant neoplasm of upper outer quadrant of female breast ([place]) 01/28/2020: Palpable right breast lump: Mammogram and ultrasound revealed calcifications spanning 2 cm, biopsy revealed IDC with DCIS, grade 2, 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   12/15/2020: Patient completed surgery to replace the expanders with the implants.  Treatment plan: 1. Neo- adjuvant chemotherapy with dose dense Adriamycin and Cytoxan/Keytruda followed by Taxol and carboplatin completed 08/18/2020 2. bilateral mastectomies:09/09/2020: Left mastectomy: Negative for cancer, right mastectomy: Pathologic complete response 0/3 lymph nodes negative 3. Adjuvant antiestrogen therapy (once patient undergoes hysterectomy and bilateral salpingo-oophorectomy, we will start aromatase inhibitor therapy) Based on extensive discussion back-and-forth we determined that she does not need adjuvant radiation.  _______________________________________________________________________ 09/09/2020: Left mastectomy: Negative for cancer, right mastectomy: Pathologic complete response 0/3 lymph nodes negative   Current treatment: Keytruda maintenance to be completed 02/03/2021 Keytruda toxicities: Mild fatigue and joint stiffness and achiness Patient plans to undergo bilateral salpingo-oophorectomy and hysterectomy 02/09/2021. We discussed  the pros and cons of antiestrogen therapy.  We discussed the risks and benefits of letrozole. She has very little benefit because of 5% weak estrogen receptor staining. However if she wants to try it it is perfectly reasonable to do so. If she wants to try it we can started in March 2023.  No orders of the defined types were placed in this encounter.  The patient has a good understanding of the overall plan. she agrees with it. she will call with any problems that may develop before the next visit here.  Total time spent: 30 mins including face to face time and time spent for planning, charting and coordination of care  Rulon Eisenmenger, MD, MPH 01/13/2021  I, Thana Ates, am acting as scribe for Dr. Nicholas Lose.  I have reviewed the above documentation for accuracy and completeness, and I agree with the above.

## 2021-01-13 ENCOUNTER — Inpatient Hospital Stay: Payer: 59 | Attending: Adult Health

## 2021-01-13 ENCOUNTER — Inpatient Hospital Stay: Payer: 59 | Admitting: Hematology and Oncology

## 2021-01-13 ENCOUNTER — Encounter: Payer: Self-pay | Admitting: Hematology and Oncology

## 2021-01-13 ENCOUNTER — Other Ambulatory Visit: Payer: Self-pay

## 2021-01-13 ENCOUNTER — Inpatient Hospital Stay: Payer: 59

## 2021-01-13 ENCOUNTER — Encounter: Payer: Self-pay | Admitting: *Deleted

## 2021-01-13 DIAGNOSIS — Z170 Estrogen receptor positive status [ER+]: Secondary | ICD-10-CM | POA: Diagnosis not present

## 2021-01-13 DIAGNOSIS — Z9013 Acquired absence of bilateral breasts and nipples: Secondary | ICD-10-CM | POA: Diagnosis not present

## 2021-01-13 DIAGNOSIS — C50411 Malignant neoplasm of upper-outer quadrant of right female breast: Secondary | ICD-10-CM | POA: Insufficient documentation

## 2021-01-13 DIAGNOSIS — Z803 Family history of malignant neoplasm of breast: Secondary | ICD-10-CM | POA: Diagnosis not present

## 2021-01-13 DIAGNOSIS — Z800 Family history of malignant neoplasm of digestive organs: Secondary | ICD-10-CM | POA: Diagnosis not present

## 2021-01-13 DIAGNOSIS — Z880 Allergy status to penicillin: Secondary | ICD-10-CM | POA: Insufficient documentation

## 2021-01-13 DIAGNOSIS — Z8049 Family history of malignant neoplasm of other genital organs: Secondary | ICD-10-CM | POA: Diagnosis not present

## 2021-01-13 DIAGNOSIS — R5383 Other fatigue: Secondary | ICD-10-CM | POA: Insufficient documentation

## 2021-01-13 DIAGNOSIS — Z8249 Family history of ischemic heart disease and other diseases of the circulatory system: Secondary | ICD-10-CM | POA: Insufficient documentation

## 2021-01-13 DIAGNOSIS — Z95828 Presence of other vascular implants and grafts: Secondary | ICD-10-CM

## 2021-01-13 DIAGNOSIS — Z8042 Family history of malignant neoplasm of prostate: Secondary | ICD-10-CM | POA: Diagnosis not present

## 2021-01-13 DIAGNOSIS — Z5112 Encounter for antineoplastic immunotherapy: Secondary | ICD-10-CM | POA: Insufficient documentation

## 2021-01-13 DIAGNOSIS — Z79899 Other long term (current) drug therapy: Secondary | ICD-10-CM | POA: Insufficient documentation

## 2021-01-13 DIAGNOSIS — Z833 Family history of diabetes mellitus: Secondary | ICD-10-CM | POA: Diagnosis not present

## 2021-01-13 LAB — CBC WITH DIFFERENTIAL (CANCER CENTER ONLY)
Abs Immature Granulocytes: 0.02 10*3/uL (ref 0.00–0.07)
Basophils Absolute: 0 10*3/uL (ref 0.0–0.1)
Basophils Relative: 0 %
Eosinophils Absolute: 0.1 10*3/uL (ref 0.0–0.5)
Eosinophils Relative: 1 %
HCT: 38.3 % (ref 36.0–46.0)
Hemoglobin: 12.9 g/dL (ref 12.0–15.0)
Immature Granulocytes: 0 %
Lymphocytes Relative: 17 %
Lymphs Abs: 1.4 10*3/uL (ref 0.7–4.0)
MCH: 29.5 pg (ref 26.0–34.0)
MCHC: 33.7 g/dL (ref 30.0–36.0)
MCV: 87.6 fL (ref 80.0–100.0)
Monocytes Absolute: 0.7 10*3/uL (ref 0.1–1.0)
Monocytes Relative: 8 %
Neutro Abs: 6.2 10*3/uL (ref 1.7–7.7)
Neutrophils Relative %: 74 %
Platelet Count: 286 10*3/uL (ref 150–400)
RBC: 4.37 MIL/uL (ref 3.87–5.11)
RDW: 13.2 % (ref 11.5–15.5)
WBC Count: 8.5 10*3/uL (ref 4.0–10.5)
nRBC: 0 % (ref 0.0–0.2)

## 2021-01-13 LAB — CMP (CANCER CENTER ONLY)
ALT: 30 U/L (ref 0–44)
AST: 32 U/L (ref 15–41)
Albumin: 4.5 g/dL (ref 3.5–5.0)
Alkaline Phosphatase: 83 U/L (ref 38–126)
Anion gap: 6 (ref 5–15)
BUN: 11 mg/dL (ref 6–20)
CO2: 30 mmol/L (ref 22–32)
Calcium: 9.6 mg/dL (ref 8.9–10.3)
Chloride: 103 mmol/L (ref 98–111)
Creatinine: 0.61 mg/dL (ref 0.44–1.00)
GFR, Estimated: >60 mL/min (ref >60)
Glucose, Bld: 84 mg/dL (ref 70–99)
Potassium: 3.8 mmol/L (ref 3.5–5.1)
Sodium: 139 mmol/L (ref 135–145)
Total Bilirubin: 0.5 mg/dL (ref 0.3–1.2)
Total Protein: 8 g/dL (ref 6.5–8.1)

## 2021-01-13 LAB — TSH: TSH: 0.497 u[IU]/mL (ref 0.308–3.960)

## 2021-01-13 MED ORDER — SODIUM CHLORIDE 0.9% FLUSH
10.0000 mL | INTRAVENOUS | Status: DC | PRN
  Administered 2021-01-13: 10 mL

## 2021-01-13 MED ORDER — SODIUM CHLORIDE 0.9% FLUSH
10.0000 mL | Freq: Once | INTRAVENOUS | Status: AC
  Administered 2021-01-13: 10 mL

## 2021-01-13 MED ORDER — SODIUM CHLORIDE 0.9 % IV SOLN: Freq: Once | INTRAVENOUS | Status: AC

## 2021-01-13 MED ORDER — HEPARIN SOD (PORK) LOCK FLUSH 100 UNIT/ML IV SOLN
500.0000 [IU] | Freq: Once | INTRAVENOUS | Status: AC | PRN
  Administered 2021-01-13: 500 [IU]

## 2021-01-13 MED ORDER — SODIUM CHLORIDE 0.9 % IV SOLN
200.0000 mg | Freq: Once | INTRAVENOUS | Status: AC
  Administered 2021-01-13: 200 mg via INTRAVENOUS
  Filled 2021-01-13: qty 8

## 2021-01-13 NOTE — Progress Notes (Signed)
Pt has completed the re-enrollment application for 6720 w/ the [facility] for [place].  I completed the physician form, got Dr. Geralyn Flash signature and faxed to [employer] for processing.  I will notify the pt of the outcome once I receive it.

## 2021-01-13 NOTE — Assessment & Plan Note (Addendum)
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 12/15/2020: Patient completed surgery to replace the expanders with the implants.  Treatment plan: 1. Neo- adjuvant chemotherapy with dose dense Adriamycin and Cytoxan/Keytrudafollowed by Taxol and carboplatincompleted 08/18/2020 2.bilateral mastectomies:09/09/2020: Left mastectomy: Negative for cancer, right mastectomy: Pathologic complete response 0/3 lymph nodes negative 3.Adjuvant antiestrogen therapy(once patient undergoes hysterectomy and bilateral salpingo-oophorectomy, we will start aromatase inhibitor therapy) Based on extensive discussion back-and-forth we determined that she does not need adjuvant radiation. _______________________________________________________________________ 09/09/2020: Left mastectomy: Negative for cancer, right mastectomy: Pathologic complete response 0/3 lymph nodes negative  Current treatment: Keytruda maintenance to be completed 02/03/2021 Keytruda toxicities: None  Cording in the axilla: Marked improvement with physical therapy with increased range of motion. Return to clinic every 3 weeks for [facility] and every 6 weeks for follow-up with me.

## 2021-01-14 ENCOUNTER — Telehealth: Payer: Self-pay | Admitting: Adult Health

## 2021-01-14 NOTE — Telephone Encounter (Signed)
Scheduled appointment per 1/4 los. Updated patient on the changes made to her appointment time.

## 2021-01-27 ENCOUNTER — Other Ambulatory Visit: Payer: 59

## 2021-01-27 ENCOUNTER — Ambulatory Visit: Payer: 59

## 2021-01-29 ENCOUNTER — Encounter: Payer: Self-pay | Admitting: Hematology and Oncology

## 2021-01-29 NOTE — Progress Notes (Signed)
Pt has been enrolled w/ the [employer] program for [facility] for $25,000 from 01/10/21 - 01/09/22.  Her copay for Beryle Flock will be $25.

## 2021-01-30 ENCOUNTER — Other Ambulatory Visit: Payer: Self-pay | Admitting: Adult Health

## 2021-01-30 DIAGNOSIS — C50411 Malignant neoplasm of upper-outer quadrant of right female breast: Secondary | ICD-10-CM

## 2021-02-03 ENCOUNTER — Inpatient Hospital Stay: Payer: 59

## 2021-02-03 ENCOUNTER — Ambulatory Visit: Payer: 59

## 2021-02-03 ENCOUNTER — Other Ambulatory Visit: Payer: Self-pay

## 2021-02-03 ENCOUNTER — Encounter: Payer: Self-pay | Admitting: *Deleted

## 2021-02-03 ENCOUNTER — Other Ambulatory Visit: Payer: 59

## 2021-02-03 ENCOUNTER — Other Ambulatory Visit (HOSPITAL_COMMUNITY): Payer: Self-pay

## 2021-02-03 ENCOUNTER — Inpatient Hospital Stay (HOSPITAL_BASED_OUTPATIENT_CLINIC_OR_DEPARTMENT_OTHER): Payer: 59 | Admitting: Adult Health

## 2021-02-03 ENCOUNTER — Encounter: Payer: Self-pay | Admitting: Adult Health

## 2021-02-03 VITALS — BP 116/75 | HR 70 | Temp 97.7°F | Resp 16 | Ht 61.0 in | Wt 119.6 lb

## 2021-02-03 DIAGNOSIS — Z9013 Acquired absence of bilateral breasts and nipples: Secondary | ICD-10-CM | POA: Diagnosis not present

## 2021-02-03 DIAGNOSIS — C50411 Malignant neoplasm of upper-outer quadrant of right female breast: Secondary | ICD-10-CM

## 2021-02-03 DIAGNOSIS — Z5112 Encounter for antineoplastic immunotherapy: Secondary | ICD-10-CM | POA: Diagnosis not present

## 2021-02-03 DIAGNOSIS — Z800 Family history of malignant neoplasm of digestive organs: Secondary | ICD-10-CM | POA: Diagnosis not present

## 2021-02-03 DIAGNOSIS — Z8042 Family history of malignant neoplasm of prostate: Secondary | ICD-10-CM | POA: Diagnosis not present

## 2021-02-03 DIAGNOSIS — Z79899 Other long term (current) drug therapy: Secondary | ICD-10-CM | POA: Diagnosis not present

## 2021-02-03 DIAGNOSIS — Z95828 Presence of other vascular implants and grafts: Secondary | ICD-10-CM

## 2021-02-03 DIAGNOSIS — Z170 Estrogen receptor positive status [ER+]: Secondary | ICD-10-CM | POA: Diagnosis not present

## 2021-02-03 DIAGNOSIS — E2839 Other primary ovarian failure: Secondary | ICD-10-CM

## 2021-02-03 DIAGNOSIS — R5383 Other fatigue: Secondary | ICD-10-CM | POA: Diagnosis not present

## 2021-02-03 DIAGNOSIS — Z880 Allergy status to penicillin: Secondary | ICD-10-CM | POA: Diagnosis not present

## 2021-02-03 LAB — CBC WITH DIFFERENTIAL (CANCER CENTER ONLY)
Abs Immature Granulocytes: 0.01 10*3/uL (ref 0.00–0.07)
Basophils Absolute: 0 10*3/uL (ref 0.0–0.1)
Basophils Relative: 1 %
Eosinophils Absolute: 0.1 10*3/uL (ref 0.0–0.5)
Eosinophils Relative: 1 %
HCT: 36.1 % (ref 36.0–46.0)
Hemoglobin: 12.3 g/dL (ref 12.0–15.0)
Immature Granulocytes: 0 %
Lymphocytes Relative: 33 %
Lymphs Abs: 1.8 10*3/uL (ref 0.7–4.0)
MCH: 29.6 pg (ref 26.0–34.0)
MCHC: 34.1 g/dL (ref 30.0–36.0)
MCV: 87 fL (ref 80.0–100.0)
Monocytes Absolute: 0.4 10*3/uL (ref 0.1–1.0)
Monocytes Relative: 7 %
Neutro Abs: 3.2 10*3/uL (ref 1.7–7.7)
Neutrophils Relative %: 58 %
Platelet Count: 223 10*3/uL (ref 150–400)
RBC: 4.15 MIL/uL (ref 3.87–5.11)
RDW: 13.8 % (ref 11.5–15.5)
WBC Count: 5.5 10*3/uL (ref 4.0–10.5)
nRBC: 0 % (ref 0.0–0.2)

## 2021-02-03 LAB — CMP (CANCER CENTER ONLY)
ALT: 22 U/L (ref 0–44)
AST: 29 U/L (ref 15–41)
Albumin: 4.6 g/dL (ref 3.5–5.0)
Alkaline Phosphatase: 90 U/L (ref 38–126)
Anion gap: 5 (ref 5–15)
BUN: 11 mg/dL (ref 6–20)
CO2: 30 mmol/L (ref 22–32)
Calcium: 9.8 mg/dL (ref 8.9–10.3)
Chloride: 102 mmol/L (ref 98–111)
Creatinine: 0.6 mg/dL (ref 0.44–1.00)
GFR, Estimated: >60 mL/min (ref >60)
Glucose, Bld: 109 mg/dL — ABNORMAL HIGH (ref 70–99)
Potassium: 3.9 mmol/L (ref 3.5–5.1)
Sodium: 137 mmol/L (ref 135–145)
Total Bilirubin: 0.4 mg/dL (ref 0.3–1.2)
Total Protein: 8 g/dL (ref 6.5–8.1)

## 2021-02-03 LAB — TSH: TSH: 0.375 u[IU]/mL (ref 0.308–3.960)

## 2021-02-03 MED ORDER — LETROZOLE 2.5 MG PO TABS
2.5000 mg | ORAL_TABLET | Freq: Every day | ORAL | 3 refills | Status: DC
  Filled 2021-02-03: qty 90, 90d supply, fill #0
  Filled 2021-05-10: qty 90, 90d supply, fill #1
  Filled 2021-08-09: qty 90, 90d supply, fill #2
  Filled 2021-11-03: qty 90, 90d supply, fill #3

## 2021-02-03 MED ORDER — SODIUM CHLORIDE 0.9 % IV SOLN
200.0000 mg | Freq: Once | INTRAVENOUS | Status: AC
  Administered 2021-02-03: 12:00:00 200 mg via INTRAVENOUS
  Filled 2021-02-03: qty 200

## 2021-02-03 MED ORDER — SODIUM CHLORIDE 0.9 % IV SOLN: Freq: Once | INTRAVENOUS | Status: AC

## 2021-02-03 MED ORDER — SODIUM CHLORIDE 0.9% FLUSH
10.0000 mL | Freq: Once | INTRAVENOUS | Status: AC
  Administered 2021-02-03: 10:00:00 10 mL

## 2021-02-03 MED ORDER — HEPARIN SOD (PORK) LOCK FLUSH 100 UNIT/ML IV SOLN
500.0000 [IU] | Freq: Once | INTRAVENOUS | Status: AC | PRN
  Administered 2021-02-03: 13:00:00 500 [IU]

## 2021-02-03 MED ORDER — SODIUM CHLORIDE 0.9% FLUSH
10.0000 mL | INTRAVENOUS | Status: DC | PRN
  Administered 2021-02-03: 13:00:00 10 mL

## 2021-02-03 NOTE — Progress Notes (Signed)
SURVIVORSHIP VISIT:   BRIEF ONCOLOGIC HISTORY:  Oncology History  Malignant neoplasm of upper outer quadrant of female breast ([place])  01/28/2020 Initial Diagnosis   Patient palpated a right breast lump. Diagnostic mammogram and US showed calcifications spanning 2.0cm in the right breast. Biopsy showed invasive and in situ carcinoma, grade 2. ER 5% weak, PR 0% negative, HER2 equiv, Ki 10%     01/28/2020 Cancer Staging   Staging form: Breast, AJCC 8th Edition - Clinical stage from 01/28/2020: Stage IIA (cT2, cN0, cM0, G2, ER+, PR-, HER2-) - Signed by Hayden Pedro, PA-C on 10/07/2020 Stage prefix: Initial diagnosis Method of lymph node assessment: Clinical    02/05/2020 - 08/18/2020 Chemotherapy    Patient is on Treatment Plan: BREAST PEMBROLIZUMAB Q21D       02/13/2020 Genetic Testing   Positive genetic testing:  A single, heterozygous, pathogenic variant was detected in the BRCA2 gene called c.2808_2811delACAA. Testing was completed through the CustomNext-Cancer + RNAinsight panel offered by [facility]. 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 The report date is 02/13/2020.  The CustomNext-Cancer+RNAinsight panel offered by [facility] includes sequencing and rearrangement analysis for the following 47 genes:  APC, ATM, AXIN2, BARD1, BMPR1A, BRCA1, BRCA2, BRIP1, CDH1, CDK4, CDKN2A, CHEK2, DICER1, EPCAM, GREM1, HOXB13, MEN1, MLH1, MSH2, MSH3, MSH6, MUTYH, NBN, NF1, NF2, NTHL1, PALB2, PMS2, POLD1, POLE, PTEN, RAD51C, RAD51D, RECQL, RET, SDHA, SDHAF2, SDHB, SDHC, SDHD, SMAD4, SMARCA4, STK11, TP53, TSC1, TSC2, and VHL.  RNA data is routinely analyzed for use in variant interpretation for all genes.   08/25/2020 - 02/03/2021 Chemotherapy   Patient is on Treatment Plan : BREAST Pembrolizumab q21d      09/09/2020 Definitive Surgery   FINAL MICROSCOPIC DIAGNOSIS:   A. BREAST, LEFT, MASTECTOMY:  - Mild  fibrocystic change with usual ductal hyperplasia and  calcifications  - Negative for carcinoma   B. BREAST, LEFT NIPPLE, BIOPSY:  - Focal atypical lobular hyperplasia  - Mild fibrocystic change  - Negative for carcinoma   C. LYMPH NODE, RIGHT AXILLARY, SENTINEL, EXCISION:  - Lymph node, negative for carcinoma (0/1)   D. LYMPH NODE, RIGHT AXILLARY, SENTINEL, EXCISION:  - Lymph node, negative for carcinoma (0/1)   E. LYMPH NODE, RIGHT AXILLARY, SENTINEL, EXCISION:  - Lymph node, negative for carcinoma (0/1)   F. BREAST, RIGHT, MASTECTOMY:  - Negative for residual carcinoma - complete therapeutic response  - Mild fibrocystic change with calcifications   G. BREAST, RIGHT NIPPLE, BIOPSY:  - Negative for residual carcinoma - complete therapeutic response  - Mild fibrocystic change      INTERVAL HISTORY:  Sydney Herman to review her survivorship care plan detailing her treatment course for breast cancer, as well as monitoring long-term side effects of that treatment, education regarding health maintenance, screening, and overall wellness and health promotion.     Overall, Sydney Herman reports feeling quite well.  She is accompanied by her husband Shanon Brow who works downstairs in radiation oncology.  She is completing her immunotherapy with pembrolizumab today.  She has tolerated it quite well.  She is going to undergo TAH/BSO next week and will have her port removed the following week.  She wants more information on antiestrogen therapy that she should start and future imaging recommendations.  REVIEW OF SYSTEMS:  Review of Systems  Constitutional:  Positive for fatigue. Negative for appetite change, chills, fever and unexpected weight change.  HENT:   Negative for hearing loss, lump/mass and  trouble swallowing.   Eyes:  Negative for eye problems and icterus.  Respiratory:  Negative for chest tightness, cough and shortness of breath.   Cardiovascular:  Negative for chest pain, leg swelling and  palpitations.  Gastrointestinal:  Negative for abdominal distention, abdominal pain, constipation, diarrhea, nausea and vomiting.  Endocrine: Negative for hot flashes.  Genitourinary:  Negative for difficulty urinating.   Musculoskeletal:  Negative for arthralgias.  Skin:  Negative for itching and rash.  Neurological:  Negative for dizziness, extremity weakness, headaches and numbness.  Hematological:  Negative for adenopathy. Does not bruise/bleed easily.  Psychiatric/Behavioral:  Negative for depression. The patient is not nervous/anxious.   Breast: Denies any new nodularity, masses, tenderness, nipple changes, or nipple discharge.      ONCOLOGY TREATMENT TEAM:  1. Surgeon:  Dr. Donne Hazel at [facility] 2. Medical Oncologist: Dr. Lindi Adie  3. Plastic Surgeon: Dr. Iran Planas    PAST MEDICAL/SURGICAL HISTORY:  Past Medical History:  Diagnosis Date   Abdominal wall mass of right lower quadrant 05/17/2013   Saw Dr. Georgette Dover 2015- was thought to be scar tissue- no growth since that time    Breast cancer [facility]) 01/23/2020   Right   Chicken pox    Family history of esophageal cancer    Family history of prostate cancer    Family history of uterine cancer    Generalized headaches    Has had some migraines in past as well. once a week or less. Usually occipital and associated with sinus pressure as well.    Heartburn in pregnancy    Past Surgical History:  Procedure Laterality Date   BREAST RECONSTRUCTION WITH PLACEMENT OF TISSUE EXPANDER AND ALLODERM Bilateral 09/09/2020   Procedure: BILATERAL BREAST RECONSTRUCTION WITH PLACEMENT OF TISSUE EXPANDER AND ALLODERM;  Surgeon: Irene Limbo, MD;  Location: [place];  Service: Plastics;  Laterality: Bilateral;   CESAREAN SECTION  07/2010   [facility]   CESAREAN SECTION N/A 12/27/2012   womens 2nd    NIPPLE SPARING MASTECTOMY Left 09/09/2020   Procedure: LEFT NIPPLE SPARING MASTECTOMY;  Surgeon: Rolm Bookbinder, MD;  Location: [place];  Service: General;  Laterality: Left;   NIPPLE SPARING MASTECTOMY WITH SENTINEL LYMPH NODE BIOPSY Right 09/09/2020   Procedure: RIGHT NIPPLE SPARING MASTECTOMY WITH RIGHT AXILLARY SENTINEL LYMPH NODE BIOPSY;  Surgeon: Rolm Bookbinder, MD;  Location: [place];  Service: General;  Laterality: Right;   PORTACATH PLACEMENT Right 02/04/2020   Procedure: INSERTION PORT-A-CATH WITH ULTRASOUND;  Surgeon: Rolm Bookbinder, MD;  Location: [place];  Service: General;  Laterality: Right;   REMOVAL OF BILATERAL TISSUE EXPANDERS WITH PLACEMENT OF BILATERAL BREAST IMPLANTS Bilateral 12/15/2020   Procedure: REMOVAL OF BILATERAL TISSUE EXPANDERS WITH PLACEMENT OF BILATERAL BREAST SILICONE IMPLANTS;  Surgeon: Irene Limbo, MD;  Location: [place];  Service: Plastics;  Laterality: Bilateral;   WISDOM TOOTH EXTRACTION       ALLERGIES:  Allergies  Allergen Reactions   Amoxil [Amoxicillin] Rash     CURRENT MEDICATIONS:  Outpatient Encounter Medications as of 02/03/2021  Medication Sig   cycloSPORINE (RESTASIS) 0.05 % ophthalmic emulsion Instill 1 drop into both eyes twice a day.   escitalopram (LEXAPRO) 10 MG tablet Take 1 tablet (10 mg total) by mouth daily.   No facility-administered encounter medications on file as of 02/03/2021.     ONCOLOGIC FAMILY HISTORY:  Family History  Problem Relation Age of Onset   Hypertension Mother    Breast  cancer Mother 18       early 59's   Diabetes Father    Atrial fibrillation Father    Other Father        biopsies on kidney- noncancerous. stated potentially precancerous   Prostate cancer Maternal Grandfather 90   Endometrial cancer Paternal Herman 66   Esophageal cancer Paternal Uncle        dx early 31s     GENETIC COUNSELING/TESTING: See above, BRCA 2 positive  SOCIAL HISTORY:  Social History   Socioeconomic History   Marital status:  Married    Spouse name: Not on file   Number of children: 2   Years of education: Not on file   Highest education level: Not on file  Occupational History   Not on file  Tobacco Use   Smoking status: Never   Smokeless tobacco: Never  Vaping Use   Vaping Use: Never used  Substance and Sexual Activity   Alcohol use: Not Currently    Alcohol/week: 2.0 - 3.0 standard drinks    Types: 2 - 3 Standard drinks or equivalent per week   Drug use: Never   Sexual activity: Yes    Partners: Male    Birth control/protection: Pill, Condom  Other Topics Concern   Not on file  Social History Narrative   Home Situation: lives with husband, 72 yo and near 77 year old (10/2015)   Husband works as Marketing executive in radiation oncology      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      Spiritual Beliefs: Christian      Lifestyle: exercising about 30-45 minutes 4-5 days per week; diet healthy      Hobbies: exercise, Barrister's clerk, church      Social Determinants of Health   Financial Resource Strain: Not on file  Food Insecurity: Not on file  Transportation Needs: Not on file  Physical Activity: Not on file  Stress: Not on file  Social Connections: Not on file  Intimate Partner Violence: Not on file     OBSERVATIONS/OBJECTIVE:  BP 116/75 (BP Location: Left Arm, Patient Position: Sitting)    Pulse 70    Temp 97.7 F (36.5 C) (Temporal)    Resp 16    Ht $R'5\' 1"'jO$  (1.549 m)    Wt 119 lb 9.6 oz (54.3 kg)    SpO2 100%    BMI 22.60 kg/m  GENERAL: Patient is a well appearing female in no acute distress HEENT:  Sclerae anicteric.  Oropharynx clear and moist. No ulcerations or evidence of oropharyngeal candidiasis. Neck is supple.  NODES:  No cervical, supraclavicular, or axillary lymphadenopathy palpated.  BREAST EXAM:  s/p bilateral mastectomies, no sign of local recurrence LUNGS:  Clear to auscultation bilaterally.  No wheezes or rhonchi. HEART:  Regular rate and rhythm. No murmur  appreciated. ABDOMEN:  Soft, nontender.  Positive, normoactive bowel sounds. No organomegaly palpated. MSK:  No focal spinal tenderness to palpation. Full range of motion bilaterally in the upper extremities. EXTREMITIES:  No peripheral edema.   SKIN:  Clear with no obvious rashes or skin changes. No nail dyscrasia. NEURO:  Nonfocal. Well oriented.  Appropriate affect.   LABORATORY DATA:  None for this visit.  DIAGNOSTIC IMAGING:  None for this visit.      ASSESSMENT AND PLAN:  Ms.. Herman is a pleasant 41 y.o. female with Stage IIA right breast invasive ductal carcinoma, ER+/PR-/HER2-, diagnosed in 01/2020, treated with lumpectomy, adjuvant radiation therapy, and anti-estrogen therapy  with Letrozole beginning in 02/2021.  She presents to the Survivorship Clinic for our initial meeting and routine follow-up post-completion of treatment for breast cancer.    1. Stage IIA right breast cancer:  Sydney Herman is continuing to recover from definitive treatment for breast cancer. She will follow-up with her medical oncologist, Dr. Lindi Adie in 3 months with history and physical exam per surveillance protocol.  She is planning on undergoing TAH/BSO next week.  Once she recovers likely mid February she will start letrozole daily.  We reviewed side effects and I sent this in.  She understands risks and benefits and is willing to proceed with therapy.  Today, a comprehensive survivorship care plan and treatment summary was reviewed with the patient today detailing her breast cancer diagnosis, treatment course, potential late/long-term effects of treatment, appropriate follow-up care with recommendations for the future, and patient education resources.  A copy of this summary, along with a letter will be sent to the patients primary care provider via mail/fax/In Basket message after todays visit.    2.  BRCA 2 +: She is status post bilateral mastectomies and will undergo TAH/BSO next week.  She has no family  history of pancreatic cancer.  3. Bone health:  Given Sydney Herman's age/history of breast cancer and her current treatment regimen including anti-estrogen therapy with Letrozole, she is at risk for bone demineralization.  She has not undergone bone density testing and I placed orders for this to be completed sometime in the next 6 months at [facility].  She was given education on specific activities to promote bone health.  4. Cancer screening:  Due to Sydney Herman's history and her age, she should receive screening for skin cancers, colon cancer, and gynecologic cancers.  She is unclear if she will require continued Pap smears after her surgery next week.  That depends on the length of time between her most recent abnormal Pap smear and also final pathology of her cervix.  The information and recommendations are listed on the patient's comprehensive care plan/treatment summary and were reviewed in detail with the patient.    5. Health maintenance and wellness promotion: Ms. Bucks was encouraged to consume 5-7 servings of fruits and vegetables per day. We reviewed the "Nutrition Rainbow" handout.  She was also encouraged to engage in moderate to vigorous exercise for 30 minutes per day most days of the week. We discussed the [employer] fitness program, which is designed for cancer survivors to help them become more physically fit after cancer treatments.  She was instructed to limit her alcohol consumption and continue to abstain from tobacco use.     6. Support services/counseling: It is not uncommon for this period of the patient's cancer care trajectory to be one of many emotions and stressors.  She was given information regarding our available services and encouraged to contact me with any questions or for help enrolling in any of our support group/programs.    Follow up instructions:    -Return to cancer center in 3 months for f/u with Dr. Lindi Adie -Follow up with surgery in 9  months -She is welcome to return back to the Survivorship Clinic at any time; no additional follow-up needed at this time.  -Consider referral back to survivorship as a long-term survivor for continued surveillance  The patient was provided an opportunity to ask questions and all were answered. The patient agreed with the plan and demonstrated an understanding of the instructions.   Total encounter time: 61  minutes face to face visit time, chart review, lab review, order entry, care coordination, and documentation of the encounter.  Wilber Bihari, NP 02/03/21 9:28 PM Medical Oncology and Hematology [facility] [place], [place] [address] Tel. [phone]    Fax. [phone]  *Total Encounter Time as defined by the Centers for Medicare and Medicaid Services includes, in addition to the face-to-face time of a patient visit (documented in the note above) non-face-to-face time: obtaining and reviewing outside history, ordering and reviewing medications, tests or procedures, care coordination (communications with other health care professionals or caregivers) and documentation in the medical record.

## 2021-02-04 ENCOUNTER — Telehealth: Payer: Self-pay | Admitting: Hematology and Oncology

## 2021-02-04 NOTE — Telephone Encounter (Signed)
Scheduled appointment per 1/26 los. Patient is aware. Patient will be mailed an updated calendar. °

## 2021-02-08 DIAGNOSIS — Z1501 Genetic susceptibility to malignant neoplasm of breast: Secondary | ICD-10-CM | POA: Diagnosis not present

## 2021-02-08 DIAGNOSIS — Z01812 Encounter for preprocedural laboratory examination: Secondary | ICD-10-CM | POA: Diagnosis not present

## 2021-02-08 DIAGNOSIS — Z1502 Genetic susceptibility to malignant neoplasm of ovary: Secondary | ICD-10-CM | POA: Diagnosis not present

## 2021-02-08 DIAGNOSIS — Z1509 Genetic susceptibility to other malignant neoplasm: Secondary | ICD-10-CM | POA: Diagnosis not present

## 2021-02-08 DIAGNOSIS — C50919 Malignant neoplasm of unspecified site of unspecified female breast: Secondary | ICD-10-CM | POA: Diagnosis not present

## 2021-02-09 ENCOUNTER — Other Ambulatory Visit (HOSPITAL_COMMUNITY): Payer: Self-pay

## 2021-02-09 DIAGNOSIS — Z79811 Long term (current) use of aromatase inhibitors: Secondary | ICD-10-CM | POA: Diagnosis not present

## 2021-02-09 DIAGNOSIS — Z170 Estrogen receptor positive status [ER+]: Secondary | ICD-10-CM | POA: Diagnosis not present

## 2021-02-09 DIAGNOSIS — R188 Other ascites: Secondary | ICD-10-CM | POA: Diagnosis not present

## 2021-02-09 DIAGNOSIS — Z9013 Acquired absence of bilateral breasts and nipples: Secondary | ICD-10-CM | POA: Diagnosis not present

## 2021-02-09 DIAGNOSIS — N838 Other noninflammatory disorders of ovary, fallopian tube and broad ligament: Secondary | ICD-10-CM | POA: Diagnosis not present

## 2021-02-09 DIAGNOSIS — N858 Other specified noninflammatory disorders of uterus: Secondary | ICD-10-CM | POA: Diagnosis not present

## 2021-02-09 DIAGNOSIS — C50411 Malignant neoplasm of upper-outer quadrant of right female breast: Secondary | ICD-10-CM | POA: Diagnosis not present

## 2021-02-09 DIAGNOSIS — Z1502 Genetic susceptibility to malignant neoplasm of ovary: Secondary | ICD-10-CM | POA: Diagnosis not present

## 2021-02-09 DIAGNOSIS — Z1501 Genetic susceptibility to malignant neoplasm of breast: Secondary | ICD-10-CM | POA: Diagnosis not present

## 2021-02-09 DIAGNOSIS — L908 Other atrophic disorders of skin: Secondary | ICD-10-CM | POA: Diagnosis not present

## 2021-02-09 DIAGNOSIS — Z853 Personal history of malignant neoplasm of breast: Secondary | ICD-10-CM | POA: Diagnosis not present

## 2021-02-09 DIAGNOSIS — Z98891 History of uterine scar from previous surgery: Secondary | ICD-10-CM | POA: Diagnosis not present

## 2021-02-09 DIAGNOSIS — Z1509 Genetic susceptibility to other malignant neoplasm: Secondary | ICD-10-CM | POA: Diagnosis not present

## 2021-02-09 DIAGNOSIS — Z803 Family history of malignant neoplasm of breast: Secondary | ICD-10-CM | POA: Diagnosis not present

## 2021-02-09 MED ORDER — OXYCODONE HCL 5 MG PO TABS
ORAL_TABLET | ORAL | 0 refills | Status: DC
  Filled 2021-02-09: qty 12, 3d supply, fill #0

## 2021-02-15 ENCOUNTER — Encounter: Payer: Self-pay | Admitting: Hematology and Oncology

## 2021-02-15 ENCOUNTER — Other Ambulatory Visit (HOSPITAL_COMMUNITY): Payer: Self-pay

## 2021-02-17 ENCOUNTER — Other Ambulatory Visit: Payer: 59

## 2021-02-17 ENCOUNTER — Ambulatory Visit: Payer: 59

## 2021-02-17 ENCOUNTER — Ambulatory Visit: Payer: 59 | Admitting: Hematology and Oncology

## 2021-02-18 ENCOUNTER — Telehealth: Payer: 59 | Admitting: Physician Assistant

## 2021-02-18 DIAGNOSIS — Z20818 Contact with and (suspected) exposure to other bacterial communicable diseases: Secondary | ICD-10-CM

## 2021-02-18 MED ORDER — AZITHROMYCIN 250 MG PO TABS: ORAL_TABLET | ORAL | 0 refills | Status: AC

## 2021-02-18 NOTE — Patient Instructions (Signed)
[name], thank you for joining Leeanne Rio, PA-C for today's virtual visit.  While this provider is not your primary care provider (PCP), if your PCP is located in our provider database this encounter information will be shared with them immediately following your visit.  Consent: (Patient) Sydney Herman provided verbal consent for this virtual visit at the beginning of the encounter.  Current Medications:  Current Outpatient Medications:    azithromycin (ZITHROMAX) 250 MG tablet, Take 2 tablets on day 1, then 1 tablet daily on days 2 through 5, Disp: 6 tablet, Rfl: 0   cycloSPORINE (RESTASIS) 0.05 % ophthalmic emulsion, Instill 1 drop into both eyes twice a day., Disp: 180 each, Rfl: 4   escitalopram (LEXAPRO) 10 MG tablet, Take 1 tablet (10 mg total) by mouth daily., Disp: 90 tablet, Rfl: 3   letrozole (FEMARA) 2.5 MG tablet, Take 1 tablet (2.5 mg total) by mouth daily., Disp: 90 tablet, Rfl: 3   oxyCODONE (OXY IR/ROXICODONE) 5 MG immediate release tablet, Take one tablet (5 mg dose) by mouth every 4 (four) hours as needed for up to 3 days. Max Daily Amount: 30 mg, Disp: 12 tablet, Rfl: 0   Medications ordered in this encounter:  Meds ordered this encounter  Medications   azithromycin (ZITHROMAX) 250 MG tablet    Sig: Take 2 tablets on day 1, then 1 tablet daily on days 2 through 5    Dispense:  6 tablet    Refill:  0    Order Specific Question:   Supervising Provider    Answer:   Noemi Chapel [[id number]]     *If you need refills on other medications prior to your next appointment, please contact your pharmacy*  Follow-Up: Call back or seek an in-person evaluation if the symptoms worsen or if the condition fails to improve as anticipated.  Other Instructions Strep Throat, Adult Strep throat is an infection of the throat. It is caused by germs (bacteria). Strep throat is common during the cold months of the year. It mostly affects children who are 5-5 years old.  However, people of all ages can get it at any time of the year. This infection spreads from person to person through coughing, sneezing, or having close contact. What are the causes? This condition is caused by the Streptococcus pyogenes germ. What increases the risk? You care for young children. Children are more likely to get strep throat and may spread it to others. You go to crowded places. Germs can spread easily in such places. You kiss or touch someone who has strep throat. What are the signs or symptoms? Fever or chills. Redness, swelling, or pain in the tonsils or throat. Pain or trouble when swallowing. White or yellow spots on the tonsils or throat. Tender glands in the neck and under the jaw. Bad breath. Red rash all over the body. This is rare. How is this treated? Medicines that kill germs (antibiotics). Medicines that treat pain or fever. These include: Ibuprofen or acetaminophen. Aspirin, only for people who are over the age of 67. Cough drops. Throat sprays. Follow these instructions at home: Medicines  Take over-the-counter and prescription medicines only as told by your doctor. Take your antibiotic medicine as told by your doctor. Do not stop taking the antibiotic even if you start to feel better. Eating and drinking  If you have trouble swallowing, eat soft foods until your throat feels better. Drink enough fluid to keep your pee (urine) pale yellow. To help  with pain, you may have: Warm fluids, such as soup and tea. Cold fluids, such as frozen desserts or popsicles. General instructions Rinse your mouth (gargle) with a salt-water mixture 3-4 times a day or as needed. To make a salt-water mixture, dissolve -1 tsp (3-6 g) of salt in 1 cup (237 mL) of warm water. Rest as much as you can. Stay home from work or school until you have been taking antibiotics for 24 hours. Do not smoke or use any products that contain nicotine or tobacco. If you need help  quitting, ask your doctor. Keep all follow-up visits. How is this prevented?  Do not share food, drinking cups, or personal items. They can cause the germs to spread. Wash your hands well with soap and water. Make sure that all people in your house wash their hands well. Have family members tested if they have a fever or a sore throat. They may need an antibiotic if they have strep throat. Contact a doctor if: You have swelling in your neck that keeps getting bigger. You get a rash, cough, or earache. You cough up a thick fluid that is green, yellow-brown, or bloody. You have pain that does not get better with medicine. Your symptoms get worse instead of getting better. You have a fever. Get help right away if: You vomit. You have a very bad headache. Your neck hurts or feels stiff. You have chest pain or are short of breath. You have drooling, very bad throat pain, or changes in your voice. Your neck is swollen, or the skin gets red and tender. Your mouth is dry, or you are peeing less than normal. You keep feeling more tired or have trouble waking up. Your joints are red or painful. These symptoms may be an emergency. Do not wait to see if the symptoms will go away. Get help right away. Call your local emergency services (911 in the [place] Summary Strep throat is an infection of the throat. It is caused by germs (bacteria). This infection can spread from person to person through coughing, sneezing, or having close contact. Take your medicines, including antibiotics, as told by your doctor. Do not stop taking the antibiotic even if you start to feel better. To prevent the spread of germs, wash your hands well with soap and water. Have others do the same. Do not share food, drinking cups, or personal items. Get help right away if you have a bad headache, chest pain, shortness of breath, a stiff or painful neck, or you vomit. This information is not intended to replace advice given to  you by your health care provider. Make sure you discuss any questions you have with your health care provider. Document Revised: 04/21/2020 Document Reviewed: 04/21/2020 Elsevier Patient Education  2022 [employer].    If you have been instructed to have an in-person evaluation today at a local Urgent Care facility, please use the link below. It will take you to a list of all of our available [facility], including address, phone number and hours of operation. Please do not delay care.  [facility]  If you or a family member do not have a primary care provider, use the link below to schedule a visit and establish care. When you choose a [facility] primary care physician or advanced practice provider, you gain a long-term partner in health. Find a Primary Care Provider  Learn more about [facility]'s in-office and virtual care options: [facility] -  Get Care Now

## 2021-02-18 NOTE — Progress Notes (Signed)
Virtual Visit Consent   Sydney Herman, you are scheduled for a virtual visit with a [facility] provider today.     Just as with appointments in the office, your consent must be obtained to participate.  Your consent will be active for this visit and any virtual visit you may have with one of our providers in the next 365 days.     If you have a MyChart account, a copy of this consent can be sent to you electronically.  All virtual visits are billed to your insurance company just like a traditional visit in the office.    As this is a virtual visit, video technology does not allow for your provider to perform a traditional examination.  This may limit your provider's ability to fully assess your condition.  If your provider identifies any concerns that need to be evaluated in person or the need to arrange testing (such as labs, EKG, etc.), we will make arrangements to do so.     Although advances in technology are sophisticated, we cannot ensure that it will always work on either your end or our end.  If the connection with a video visit is poor, the visit may have to be switched to a telephone visit.  With either a video or telephone visit, we are not always able to ensure that we have a secure connection.     I need to obtain your verbal consent now.   Are you willing to proceed with your visit today?    Sydney Herman has provided verbal consent on 02/18/2021 for a virtual visit (video or telephone).   Leeanne Rio, [place]   Date: 02/18/2021 8:05 AM   Virtual Visit via Video Note   I, Leeanne Rio, connected with  Sydney Herman  ([id number], [birth year]) on 02/18/21 at  8:00 AM EST by a video-enabled telemedicine application and verified that I am speaking with the correct person using two identifiers.  Location: Patient: Virtual Visit Location Patient: Home Provider: Virtual Visit Location Provider: Home Office   I discussed the limitations of evaluation and management by  telemedicine and the availability of in person appointments. The patient expressed understanding and agreed to proceed.    History of Present Illness: Sydney Herman is a 41 y.o. who identifies as a female who was assigned female at birth, and is being seen today for possible strep throat. Notes both her sons currently under treatment for strep throat starting earlier this week. Notes as of yesterday having some aches, fatigue and significant sore throat. Throat pain is worse today and with tender adenopathy. Low-grade fever and chills yesterday. Denies any significant congestion. Ibuprofen last night for throat pain.   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[facility]) 01/28/2020   History of abnormal cervical Pap smear 11/16/2018   Family history of type 2 diabetes mellitus in father 11/16/2018   Family history of breast cancer in mother 11/16/2018   Mild hyperlipidemia 11/02/2017   Vitamin B12 deficiency 02/20/2017   LGSIL (low grade squamous intraepithelial lesion) on Pap smear 10/02/2011    Allergies:  Allergies  Allergen Reactions   Amoxil [Amoxicillin] Rash   Medications:  Current Outpatient Medications:    azithromycin (ZITHROMAX) 250 MG tablet, Take 2 tablets on day 1, then 1 tablet daily on days 2 through 5, Disp: 6 tablet, Rfl: 0   cycloSPORINE (RESTASIS) 0.05 % ophthalmic emulsion, Instill 1 drop into both eyes twice a day., Disp: 180 each, Rfl: 4   escitalopram (LEXAPRO) 10 MG tablet, Take 1 tablet (10 mg total) by mouth daily., Disp: 90 tablet, Rfl: 3   letrozole (FEMARA) 2.5 MG tablet, Take 1 tablet (2.5 mg total) by mouth daily., Disp: 90 tablet, Rfl:  3   oxyCODONE (OXY IR/ROXICODONE) 5 MG immediate release tablet, Take one tablet (5 mg dose) by mouth every 4 (four) hours as needed for up to 3 days. Max Daily Amount: 30 mg, Disp: 12 tablet, Rfl: 0  Observations/Objective: Patient is well-developed, well-nourished in no acute distress.  Resting comfortably at home.  Head is normocephalic, atraumatic.  No labored breathing. Speech is clear and coherent with logical content.  Patient is alert and oriented at baseline.   Assessment and Plan: 1. Streptococcus exposure - azithromycin (ZITHROMAX) 250 MG tablet; Take 2 tablets on day 1, then 1 tablet daily on days 2 through 5  Dispense: 6 tablet; Refill: 0  Now with symptoms concerning for strep pharyngitis. Is penicillin allergic. Will Rx Azithromycin. Supportive measures and OTC medications reviewed. In-person follow-up for any non-resolving, new or worsening symptoms despite treatment.   Follow Up Instructions: I discussed the assessment and treatment plan with the patient. The patient was provided an opportunity to ask questions and all were answered. The patient agreed with the plan and demonstrated an understanding of the instructions.  A copy of instructions were sent to the patient via MyChart unless otherwise noted below.   The patient was advised to call back or seek an in-person evaluation if the symptoms worsen or if the condition fails to improve as anticipated.  Time:  I spent 10 minutes with the patient via telehealth technology discussing the above problems/concerns.    Leeanne Rio, PA-C

## 2021-02-19 DIAGNOSIS — C50411 Malignant neoplasm of upper-outer quadrant of right female breast: Secondary | ICD-10-CM | POA: Diagnosis not present

## 2021-02-19 DIAGNOSIS — Z452 Encounter for adjustment and management of vascular access device: Secondary | ICD-10-CM | POA: Diagnosis not present

## 2021-02-24 ENCOUNTER — Encounter: Payer: Self-pay | Admitting: Adult Health

## 2021-02-24 ENCOUNTER — Ambulatory Visit (HOSPITAL_BASED_OUTPATIENT_CLINIC_OR_DEPARTMENT_OTHER)
Admission: RE | Admit: 2021-02-24 | Discharge: 2021-02-24 | Disposition: A | Discharge status: Home / Self Care | Payer: 59 | Source: Ambulatory Visit | Attending: Adult Health | Admitting: Adult Health

## 2021-02-24 ENCOUNTER — Other Ambulatory Visit: Payer: Self-pay

## 2021-02-24 DIAGNOSIS — Z170 Estrogen receptor positive status [ER+]: Secondary | ICD-10-CM | POA: Diagnosis not present

## 2021-02-24 DIAGNOSIS — M8588 Other specified disorders of bone density and structure, other site: Secondary | ICD-10-CM | POA: Diagnosis not present

## 2021-02-24 DIAGNOSIS — E2839 Other primary ovarian failure: Secondary | ICD-10-CM | POA: Diagnosis not present

## 2021-02-24 DIAGNOSIS — C50411 Malignant neoplasm of upper-outer quadrant of right female breast: Secondary | ICD-10-CM | POA: Diagnosis not present

## 2021-03-10 ENCOUNTER — Ambulatory Visit: Payer: 59

## 2021-03-10 ENCOUNTER — Other Ambulatory Visit: Payer: 59

## 2021-03-22 ENCOUNTER — Ambulatory Visit: Payer: 59 | Attending: Hematology and Oncology

## 2021-03-22 ENCOUNTER — Other Ambulatory Visit: Payer: Self-pay

## 2021-03-22 ENCOUNTER — Ambulatory Visit: Payer: 59

## 2021-03-22 VITALS — Wt 117.0 lb

## 2021-03-22 DIAGNOSIS — Z483 Aftercare following surgery for neoplasm: Secondary | ICD-10-CM | POA: Insufficient documentation

## 2021-03-22 NOTE — Therapy (Signed)
[facility] ?[place], [place], [address] ?Phone: [phone]   Fax:  [phone] ? ?Physical Therapy Treatment ? ?Patient Details  ?Name: Sydney Herman ?MRN: [id number] ?Date of Birth: 13-Sep-1980 ?Referring Provider (PT): Dr. Nicholas Lose ? ? ?Encounter Date: 03/22/2021 ? ? PT End of Session - 03/22/21 1058   ? ? Visit Number 7   # unchanged due to screen only  ? PT Start Time 1056   ? PT Stop Time 1100   ? PT Time Calculation (min) 4 min   ? Activity Tolerance Patient tolerated treatment well   ? Behavior During Therapy [facility] for tasks assessed/performed   ? ?  ?  ? ?  ? ? ?Past Medical History:  ?Diagnosis Date  ? Abdominal wall mass of right lower quadrant 05/17/2013  ? Saw Dr. Georgette Dover 2015- was thought to be scar tissue- no growth since that time   ? Breast cancer ([facility]) 01/23/2020  ? Right  ? Chicken pox   ? Family history of esophageal cancer   ? Family history of prostate cancer   ? Family history of uterine cancer   ? Generalized headaches   ? Has had some migraines in past as well. once a week or less. Usually occipital and associated with sinus pressure as well.   ? Heartburn in pregnancy   ? ? ?Past Surgical History:  ?Procedure Laterality Date  ? BREAST RECONSTRUCTION WITH PLACEMENT OF TISSUE EXPANDER AND ALLODERM Bilateral 09/09/2020  ? Procedure: BILATERAL BREAST RECONSTRUCTION WITH PLACEMENT OF TISSUE EXPANDER AND ALLODERM;  Surgeon: Irene Limbo, MD;  Location: [facility];  Service: Plastics;  Laterality: Bilateral;  ? CESAREAN SECTION  07/2010  ? [facility]  ? CESAREAN SECTION N/A 12/27/2012  ? womens 2nd   ? NIPPLE SPARING MASTECTOMY Left 09/09/2020  ? Procedure: LEFT NIPPLE SPARING MASTECTOMY;  Surgeon: Rolm Bookbinder, MD;  Location: [facility];  Service: General;  Laterality: Left;  ? NIPPLE SPARING MASTECTOMY WITH SENTINEL LYMPH NODE BIOPSY Right 09/09/2020  ? Procedure: RIGHT NIPPLE SPARING  MASTECTOMY WITH RIGHT AXILLARY SENTINEL LYMPH NODE BIOPSY;  Surgeon: Rolm Bookbinder, MD;  Location: [facility];  Service: General;  Laterality: Right;  ? PORTACATH PLACEMENT Right 02/04/2020  ? Procedure: INSERTION PORT-A-CATH WITH ULTRASOUND;  Surgeon: Rolm Bookbinder, MD;  Location: [facility];  Service: General;  Laterality: Right;  ? REMOVAL OF BILATERAL TISSUE EXPANDERS WITH PLACEMENT OF BILATERAL BREAST IMPLANTS Bilateral 12/15/2020  ? Procedure: REMOVAL OF BILATERAL TISSUE EXPANDERS WITH PLACEMENT OF BILATERAL BREAST SILICONE IMPLANTS;  Surgeon: Irene Limbo, MD;  Location: [facility];  Service: Plastics;  Laterality: Bilateral;  ? WISDOM TOOTH EXTRACTION    ? ? ?Vitals:  ? 03/22/21 1057  ?Weight: 117 lb (53.1 kg)  ? ? ? Subjective Assessment - 03/22/21 1057   ? ? Subjective Pt is here for her SOZO screen   ? 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 She underwent neoadjuvant chemotherapy 02/05/2020 - 08/18/2020 followed by a bilateral mastectomy and right sentinel node biopsy (3 negative nodes) on 09/09/2020. The mass measures 2 cm and is located in the upper outer quadrant with a Ki67 of 10%.   ? ?  ?  ? ?  ? ? ? ? ? ? ? ? ? L-DEX FLOWSHEETS - 03/22/21 1000   ? ?  ? L-DEX LYMPHEDEMA SCREENING  ?  Measurement Type Unilateral   ? L-DEX MEASUREMENT EXTREMITY Upper Extremity   ? POSITION  Standing   ? DOMINANT SIDE Right   ? At Risk Side Right   ? BASELINE SCORE (UNILATERAL) -2.8   ? L-DEX SCORE (UNILATERAL) -3.3   ? VALUE CHANGE (UNILAT) -0.5   ? ?  ?  ? ?  ? ? ? ? ? ? ? ? ? ? ? ? ? ? ? ? ? ? ? ? ? ? ? ? ? ? PT Long Term Goals - 10/19/20 1051   ? ?  ? PT LONG TERM GOAL #1  ? Title Patient will demonstrate she has regained full shoulder ROM and function post operatively compared to baselines.   ? Status Achieved   ?  ? PT LONG TERM GOAL #2  ? Title Patient will increase right  shoulder flexion to 150 degrees for increased ease reaching overhead.   ? Status Achieved   ?  ? PT LONG TERM GOAL #3  ? Title Patient will increase right shoulder abduction to 160 degrees for ability to obtain radiation positioning.   ? Status Achieved   ?  ? PT LONG TERM GOAL #4  ? Title Patient will improve her DASH score to be </= 8 for improved overall UE function.   ? Status Achieved   ?  ? PT LONG TERM GOAL #5  ? Title Patient will report good understanding of lymphedema risk reduction practices.   ? Status Achieved   ? ?  ?  ? ?  ? ? ? ? ? ? ? ? Plan - 03/22/21 1100   ? ? Clinical Impression Statement PT returns for her 3 month L-Dex screen. Her change from baseline of -0.5 is WNL so no further treatment is required at this time except to cont every 3 month L-Dex screens which pt is agreeable to.   ? PT Next Visit Plan Continue SOZO screens every 3 months for up to 2 years from her [facility] 09/10/2022)   ? Consulted and Agree with Plan of Care Patient   ? ?  ?  ? ?  ? ? ?Patient will benefit from skilled therapeutic intervention in order to improve the following deficits and impairments:    ? ?Visit Diagnosis: ?Aftercare following surgery for neoplasm ? ? ? ? ?Problem List ?Patient Active Problem List  ? Diagnosis Date Noted  ? Malignant tumor of breast ([facility]) 05/12/2020  ? Port-A-Cath in place 03/19/2020  ? BRCA2 gene mutation positive 02/21/2020  ? Family history of prostate cancer   ? Family history of uterine cancer   ? Family history of esophageal cancer   ? Malignant neoplasm of upper outer quadrant of female breast ([facility]) 01/28/2020  ? History of abnormal cervical Pap smear 11/16/2018  ? Family history of type 2 diabetes mellitus in father 11/16/2018  ? Family history of breast cancer in mother 11/16/2018  ? Mild hyperlipidemia 11/02/2017  ? Vitamin B12 deficiency 02/20/2017  ? LGSIL (low grade squamous intraepithelial lesion) on Pap smear 10/02/2011  ? ? ?Otelia Limes, PTA ?03/22/2021, 11:02  AM ? ?[facility] ?[facility] [address] ?[place], [place], [address] ?Phone: [phone]   Fax:  [phone] ? ?Name: PAIGHTON GODETTE ?MRN: [id number] ?Date of Birth: 11/22/1980 ? ? ? ?

## 2021-03-26 ENCOUNTER — Other Ambulatory Visit (HOSPITAL_COMMUNITY): Payer: Self-pay

## 2021-03-30 ENCOUNTER — Other Ambulatory Visit (HOSPITAL_COMMUNITY): Payer: Self-pay

## 2021-03-31 ENCOUNTER — Other Ambulatory Visit (HOSPITAL_COMMUNITY): Payer: Self-pay

## 2021-03-31 MED ORDER — CLINPRO 5000 1.1 % DT PSTE: PASTE | DENTAL | 4 refills | Status: DC

## 2021-04-01 DIAGNOSIS — G8918 Other acute postprocedural pain: Secondary | ICD-10-CM | POA: Diagnosis not present

## 2021-04-01 DIAGNOSIS — Z1509 Genetic susceptibility to other malignant neoplasm: Secondary | ICD-10-CM | POA: Diagnosis not present

## 2021-04-01 DIAGNOSIS — Z1501 Genetic susceptibility to malignant neoplasm of breast: Secondary | ICD-10-CM | POA: Diagnosis not present

## 2021-04-06 ENCOUNTER — Other Ambulatory Visit (HOSPITAL_COMMUNITY): Payer: Self-pay

## 2021-04-16 ENCOUNTER — Other Ambulatory Visit (HOSPITAL_COMMUNITY): Payer: Self-pay

## 2021-04-20 ENCOUNTER — Other Ambulatory Visit (HOSPITAL_COMMUNITY): Payer: Self-pay

## 2021-04-21 NOTE — Progress Notes (Signed)
? ?Patient Care Team: ?Luetta Nutting, DO as PCP - General (Family Medicine) ?[name], MD as Consulting Physician (Obstetrics and Gynecology) ?Nicholas Lose, MD as Consulting Physician (Hematology and Oncology) ?Irene Limbo, MD as Consulting Physician (Plastic Surgery) ?Rolm Bookbinder, MD as Consulting Physician (General Surgery) ?Hart Rochester, MD as Consulting Physician (Obstetrics and Gynecology) ? ?DIAGNOSIS:  ?Encounter Diagnosis  ?Name Primary?  ? Malignant neoplasm of upper-outer quadrant of right breast in female, estrogen receptor positive ([place])   ? ? ?SUMMARY OF ONCOLOGIC HISTORY: ?Oncology History  ?Malignant neoplasm of upper outer quadrant of female breast ([place])  ?01/28/2020 Initial Diagnosis  ? Patient palpated a right breast lump. Diagnostic mammogram and US showed calcifications spanning 2.0cm in the right breast. Biopsy showed invasive and in situ carcinoma, grade 2. ER 5% weak, PR 0% negative, HER2 equiv, Ki 10%  ? ?  ?01/28/2020 Cancer Staging  ? Staging form: Breast, AJCC 8th Edition ?- Clinical stage from 01/28/2020: Stage IIA (cT2, cN0, cM0, G2, ER+, PR-, HER2-) - Signed by Hayden Pedro, PA-C on 10/07/2020 ?Stage prefix: Initial diagnosis ?Method of lymph node assessment: Clinical ? ?  ?02/05/2020 - 08/18/2020 Chemotherapy  ?  Patient is on Treatment Plan: BREAST PEMBROLIZUMAB Q21D ? ?  ? ?  ?02/13/2020 Genetic Testing  ? Positive genetic testing:  A single, heterozygous, pathogenic variant was detected in the BRCA2 gene called [id number]. Testing was completed through the CustomNext-Cancer + RNAinsight panel offered by [facility]. 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 The report date is 02/13/2020. ? ?The CustomNext-Cancer+RNAinsight panel offered by [facility] includes sequencing and rearrangement analysis for the following 47 genes:  APC, ATM, AXIN2, BARD1, BMPR1A, BRCA1,  BRCA2, BRIP1, CDH1, CDK4, CDKN2A, CHEK2, DICER1, EPCAM, GREM1, HOXB13, MEN1, MLH1, MSH2, MSH3, MSH6, MUTYH, NBN, NF1, NF2, NTHL1, PALB2, PMS2, POLD1, POLE, PTEN, RAD51C, RAD51D, RECQL, RET, SDHA, SDHAF2, SDHB, SDHC, SDHD, SMAD4, SMARCA4, STK11, TP53, TSC1, TSC2, and VHL.  RNA data is routinely analyzed for use in variant interpretation for all genes. ?  ?08/25/2020 - 02/03/2021 Chemotherapy  ? Patient is on Treatment Plan : BREAST Pembrolizumab q21d  ?  ?  ?09/09/2020 Definitive Surgery  ? FINAL MICROSCOPIC DIAGNOSIS:  ? ?A. BREAST, LEFT, MASTECTOMY:  ?- Mild fibrocystic change with usual ductal hyperplasia and  ?calcifications  ?- Negative for carcinoma  ? ?B. BREAST, LEFT NIPPLE, BIOPSY:  ?- Focal atypical lobular hyperplasia  ?- Mild fibrocystic change  ?- Negative for carcinoma  ? ?C. LYMPH NODE, RIGHT AXILLARY, SENTINEL, EXCISION:  ?- Lymph node, negative for carcinoma (0/1)  ? ?D. LYMPH NODE, RIGHT AXILLARY, SENTINEL, EXCISION:  ?- Lymph node, negative for carcinoma (0/1)  ? ?E. LYMPH NODE, RIGHT AXILLARY, SENTINEL, EXCISION:  ?- Lymph node, negative for carcinoma (0/1)  ? ?F. BREAST, RIGHT, MASTECTOMY:  ?- Negative for residual carcinoma - complete therapeutic response  ?- Mild fibrocystic change with calcifications  ? ?G. BREAST, RIGHT NIPPLE, BIOPSY:  ?- Negative for residual carcinoma - complete therapeutic response  ?- Mild fibrocystic change  ?  ?02/09/2021 Surgery  ? Hysterectomy with BSO ?  ? ? ?CHIEF COMPLIANT: Follow up on Letrozole ? ?INTERVAL HISTORY: Sydney Herman is a 41 y.o. with above-mentioned history of right breast cancer discussed the pros and cons of antiestrogen therapy. She presents to the clinic today for a follow-up. She states that she has some fatigue and joint stiffness. Starts first thing in the morning and when  she sits and get up. She states the Letrozole is manageable. Denies hot flashes. Concern about her right side breast feels firm. ? ? ?ALLERGIES:  is allergic to amoxil  [amoxicillin]. ? ?MEDICATIONS:  ?Current Outpatient Medications  ?Medication Sig Dispense Refill  ? cycloSPORINE (RESTASIS) 0.05 % ophthalmic emulsion Instill 1 drop into both eyes twice a day. 180 each 4  ? escitalopram (LEXAPRO) 10 MG tablet Take 1 tablet (10 mg total) by mouth daily. 90 tablet 3  ? letrozole (FEMARA) 2.5 MG tablet Take 1 tablet (2.5 mg total) by mouth daily. 90 tablet 3  ? Sodium Fluoride (CLINPRO 5000) 1.1 % PSTE APPLY A PEA SIZED AMOUNT TO TEETH WITH FINGERS OR A TOOTHBRUSH AFTER BREAKFAST AND JUST BEFORE BED 113 g 4  ? ?No current facility-administered medications for this visit.  ? ? ?PHYSICAL EXAMINATION: ?ECOG PERFORMANCE STATUS: 1 - Symptomatic but completely ambulatory ? ?Vitals:  ? 05/05/21 0928  ?BP: 125/90  ?Pulse: 64  ?Resp: 18  ?Temp: 97.8 ?F (36.6 ?C)  ?SpO2: 100%  ? ?Filed Weights  ? 05/05/21 0928  ?Weight: 124 lb (56.2 kg)  ? ? ?BREAST: Bilateral breast reconstruction.  No palpable lumps or nodules (exam performed in the presence of a chaperone) ? ?LABORATORY DATA:  ?I have reviewed the data as listed ? ?  Latest Ref Rng & Units 04/27/2021  ? 12:00 AM 02/03/2021  ?  9:59 AM 01/13/2021  ? 10:07 AM  ?CMP  ?Glucose 65 - 99 mg/dL 80   109   84    ?BUN 7 - 25 mg/dL '14   11   11    ' ?Creatinine 0.50 - 0.97 mg/dL 0.65   0.60   0.61    ?Sodium 135 - 146 mmol/L 139   137   139    ?Potassium 3.5 - 5.3 mmol/L 4.5   3.9   3.8    ?Chloride 98 - 110 mmol/L 100   102   103    ?CO2 20 - 32 mmol/L '29   30   30    ' ?Calcium 8.6 - 10.2 mg/dL 10.1   9.8   9.6    ?Total Protein 6.1 - 8.1 g/dL 8.2   8.0   8.0    ?Total Bilirubin 0.2 - 1.2 mg/dL 0.5   0.4   0.5    ?Alkaline Phos 38 - 126 U/L  90   83    ?AST 10 - 30 U/L 26   29   32    ?ALT 6 - 29 U/L '18   22   30    ' ? ? ?Lab Results  ?Component Value Date  ? WBC 5.2 04/27/2021  ? HGB 13.7 04/27/2021  ? HCT 41.8 04/27/2021  ? MCV 90.7 04/27/2021  ? PLT 249 04/27/2021  ? NEUTROABS 2,766 04/27/2021  ? ? ?ASSESSMENT & PLAN:  ?Malignant neoplasm of upper outer  quadrant of female breast ([place]) ?01/28/2020: Palpable right breast lump: Mammogram and ultrasound revealed calcifications spanning 2 cm, biopsy revealed IDC with DCIS, grade 2, ER 5% weak, PR 0%, HER2 equivocal by IHC, FISH negative ratio 1.67 Ki-67 10% ?   ?BRCA2 positive: Risk discussion regarding future breast cancer, and ovarian cancer risk, discussed risk reducing bilateral mastectomies and RRSO.   ?12/15/2020: Patient completed surgery to replace the expanders with the implants. ?  ?Treatment plan: ?1. Neo- adjuvant chemotherapy with dose dense Adriamycin and Cytoxan/Keytruda followed by Taxol and carboplatin completed 08/18/2020 ?2. bilateral mastectomies:09/09/2020: Left mastectomy: Negative  for cancer, right mastectomy: Pathologic complete response 0/3 lymph nodes negative ?3. Adjuvant antiestrogen therapy (once patient undergoes hysterectomy and bilateral salpingo-oophorectomy, we will start aromatase inhibitor therapy) ?Based on extensive discussion back-and-forth we determined that she does not need adjuvant radiation. ? _______________________________________________________________________ ?Current treatment: Letrozole started March 2023 ?Letrozole toxicities: ?1.  Joint stiffness and achiness: She has been on restrictions because of the hysterectomy and she will start exercising soon.  We will see how she does once she can exercise better. ? ?Concerns in the breast: Breast examination does not reveal any clear-cut abnormalities to warrant further investigation at this time. ? ?We discussed the role of Signatera testing and early detection of recurrence of breast cancer ? ?Breast cancer surveillance: ?1.  Breast exam 05/05/2021: Benign ?2. no role of imaging studies since she had bilateral mastectomies ? ?We discussed that we can consider doing an MRI at 1 year if there are any concerns. ?Return to clinic in 1 year for follow-up ? ? ? ?No orders of the defined types were placed in this encounter. ? ?The  patient has a good understanding of the overall plan. she agrees with it. she will call with any problems that may develop before the next visit here. ?Total time spent: 30 mins including face to face time and ti

## 2021-04-23 ENCOUNTER — Encounter: Payer: Self-pay | Admitting: Family Medicine

## 2021-04-27 ENCOUNTER — Encounter: Payer: Self-pay | Admitting: Family Medicine

## 2021-04-27 ENCOUNTER — Ambulatory Visit (INDEPENDENT_AMBULATORY_CARE_PROVIDER_SITE_OTHER): Payer: 59 | Admitting: Family Medicine

## 2021-04-27 VITALS — BP 117/81 | HR 59 | Ht 61.0 in | Wt 119.2 lb

## 2021-04-27 DIAGNOSIS — M8580 Other specified disorders of bone density and structure, unspecified site: Secondary | ICD-10-CM | POA: Diagnosis not present

## 2021-04-27 DIAGNOSIS — E785 Hyperlipidemia, unspecified: Secondary | ICD-10-CM | POA: Diagnosis not present

## 2021-04-27 DIAGNOSIS — E538 Deficiency of other specified B group vitamins: Secondary | ICD-10-CM | POA: Diagnosis not present

## 2021-04-27 DIAGNOSIS — Z0000 Encounter for general adult medical examination without abnormal findings: Secondary | ICD-10-CM | POA: Diagnosis not present

## 2021-04-27 DIAGNOSIS — F4322 Adjustment disorder with anxiety: Secondary | ICD-10-CM | POA: Diagnosis not present

## 2021-04-27 NOTE — Assessment & Plan Note (Signed)
Well adult ?Orders Placed This Encounter  ?Procedures  ?? COMPLETE METABOLIC PANEL WITH GFR  ?? CBC with Differential  ?? Lipid Panel w/reflex Direct LDL  ?? TSH  ?? B12  ?? Vitamin D (25 hydroxy)  ?? Ambulatory referral to Psychology  ?  Referral Priority:   Routine  ?  Referral Type:   Psychiatric  ?  Referral Reason:   Specialty Services Required  ?  Requested Specialty:   Psychology  ?  Number of Visits Requested:   1  ?Screening: Per lab orders.   ?Immunizations: UTD ?Anticipatory guidance/Risk factor reduction:  Recommendations per AVS.  She is having increased anxiety, referral entered for therapist.   ?

## 2021-04-27 NOTE — Patient Instructions (Signed)
Preventive Care 40-39 Years Old, Female ?Preventive care refers to lifestyle choices and visits with your health care provider that can promote health and wellness. Preventive care visits are also called wellness exams. ?What can I expect for my preventive care visit? ?Counseling ?During your preventive care visit, your health care provider may ask about your: ?Medical history, including: ?Past medical problems. ?Family medical history. ?Pregnancy history. ?Current health, including: ?Menstrual cycle. ?Method of birth control. ?Emotional well-being. ?Home life and relationship well-being. ?Sexual activity and sexual health. ?Lifestyle, including: ?Alcohol, nicotine or tobacco, and drug use. ?Access to firearms. ?Diet, exercise, and sleep habits. ?Work and work environment. ?Sunscreen use. ?Safety issues such as seatbelt and bike helmet use. ?Physical exam ?Your health care provider may check your: ?Height and weight. These may be used to calculate your BMI (body mass index). BMI is a measurement that tells if you are at a healthy weight. ?Waist circumference. This measures the distance around your waistline. This measurement also tells if you are at a healthy weight and may help predict your risk of certain diseases, such as type 2 diabetes and high blood pressure. ?Heart rate and blood pressure. ?Body temperature. ?Skin for abnormal spots. ?What immunizations do I need? ? ?Vaccines are usually given at various ages, according to a schedule. Your health care provider will recommend vaccines for you based on your age, medical history, and lifestyle or other factors, such as travel or where you work. ?What tests do I need? ?Screening ?Your health care provider may recommend screening tests for certain conditions. This may include: ?Pelvic exam and Pap test. ?Lipid and cholesterol levels. ?Diabetes screening. This is done by checking your blood sugar (glucose) after you have not eaten for a while (fasting). ?Hepatitis  B test. ?Hepatitis C test. ?HIV (human immunodeficiency virus) test. ?STI (sexually transmitted infection) testing, if you are at risk. ?BRCA-related cancer screening. This may be done if you have a family history of breast, ovarian, tubal, or peritoneal cancers. ?Talk with your health care provider about your test results, treatment options, and if necessary, the need for more tests. ?Follow these instructions at home: ?Eating and drinking ? ?Eat a healthy diet that includes fresh fruits and vegetables, whole grains, lean protein, and low-fat dairy products. ?Take vitamin and mineral supplements as recommended by your health care provider. ?Do not drink alcohol if: ?Your health care provider tells you not to drink. ?You are pregnant, may be pregnant, or are planning to become pregnant. ?If you drink alcohol: ?Limit how much you have to 0-1 drink a day. ?Know how much alcohol is in your drink. In the [place] one drink equals one 12 oz bottle of beer (355 mL), one 5 oz glass of wine (148 mL), or one 1? oz glass of hard liquor (44 mL). ?Lifestyle ?Brush your teeth every morning and night with fluoride toothpaste. Floss one time each day. ?Exercise for at least 30 minutes 5 or more days each week. ?Do not use any products that contain nicotine or tobacco. These products include cigarettes, chewing tobacco, and vaping devices, such as e-cigarettes. If you need help quitting, ask your health care provider. ?Do not use drugs. ?If you are sexually active, practice safe sex. Use a condom or other form of protection to prevent STIs. ?If you do not wish to become pregnant, use a form of birth control. If you plan to become pregnant, see your health care provider for a prepregnancy visit. ?Find healthy ways to manage stress, such as: ?Meditation,   yoga, or listening to music. ?Journaling. ?Talking to a trusted person. ?Spending time with friends and family. ?Minimize exposure to UV radiation to reduce your risk of skin  cancer. ?Safety ?Always wear your seat belt while driving or riding in a vehicle. ?Do not drive: ?If you have been drinking alcohol. Do not ride with someone who has been drinking. ?If you have been using any mind-altering substances or drugs. ?While texting. ?When you are tired or distracted. ?Wear a helmet and other protective equipment during sports activities. ?If you have firearms in your house, make sure you follow all gun safety procedures. ?Seek help if you have been physically or sexually abused. ?What's next? ?Go to your health care provider once a year for an annual wellness visit. ?Ask your health care provider how often you should have your eyes and teeth checked. ?Stay up to date on all vaccines. ?This information is not intended to replace advice given to you by your health care provider. Make sure you discuss any questions you have with your health care provider. ?Document Revised: 06/24/2020 Document Reviewed: 06/24/2020 ?Elsevier Patient Education ? [place]. ? ?

## 2021-04-27 NOTE — Progress Notes (Signed)
?Sydney Herman - 41 y.o. female MRN [id number]  Date of birth: 09/14/1980 ? ?Subjective ?Chief Complaint  ?Patient presents with  ? Transitions Of Care  ? ? ?HPI ?Sydney Herman is a 41 y.o. female here today for annual exam.  She is a former patient of Dr. Sheppard Coil.  She was diagnosed with triple negative breast cancer and underwent neo-adjuvant chemotherapy with bilateral mastectomy.  She did test positive for BRCA2 gene and recently underwent complete hysterectomy with BSO.  She is doing well at this point but feels like she may be having some side effects from Letrozole.  She also has felt a little more down and a little more anxious recently.  Lexapro seems to be doing ok but she would be interested in seeing a therapist.   ? ?She does try to stay pretty active and feels like her diet is healthy.  ? ? She is a non-smoker.  She consume EtOH  occasionally.  ? ?She is up to date on immunizations.  ? ?Review of Systems  ?Constitutional:  Negative for chills, fever, malaise/fatigue and weight loss.  ?HENT:  Negative for congestion, ear pain and sore throat.   ?Eyes:  Negative for blurred vision, double vision and pain.  ?Respiratory:  Negative for cough and shortness of breath.   ?Cardiovascular:  Negative for chest pain and palpitations.  ?Gastrointestinal:  Negative for abdominal pain, blood in stool, constipation, heartburn and nausea.  ?Genitourinary:  Negative for dysuria and urgency.  ?Musculoskeletal:  Negative for joint pain and myalgias.  ?Neurological:  Negative for dizziness and headaches.  ?Endo/Heme/Allergies:  Does not bruise/bleed easily.  ?Psychiatric/Behavioral:  Negative for depression. The patient is not nervous/anxious and does not have insomnia.   ? ? ?Allergies  ?Allergen Reactions  ? Amoxil [Amoxicillin] Rash  ? ? ?Past Medical History:  ?Diagnosis Date  ? Abdominal wall mass of right lower quadrant 05/17/2013  ? Saw Dr. Georgette Dover 2015- was thought to be scar tissue- no growth since that time   ?  Breast cancer ([place]) 01/23/2020  ? Right  ? Chicken pox   ? Family history of esophageal cancer   ? Family history of prostate cancer   ? Family history of uterine cancer   ? Generalized headaches   ? Has had some migraines in past as well. once a week or less. Usually occipital and associated with sinus pressure as well.   ? Heartburn in pregnancy   ? ? ?Past Surgical History:  ?Procedure Laterality Date  ? BREAST RECONSTRUCTION WITH PLACEMENT OF TISSUE EXPANDER AND ALLODERM Bilateral 09/09/2020  ? Procedure: BILATERAL BREAST RECONSTRUCTION WITH PLACEMENT OF TISSUE EXPANDER AND ALLODERM;  Surgeon: Irene Limbo, MD;  Location: [facility];  Service: Plastics;  Laterality: Bilateral;  ? CESAREAN SECTION  07/11/2010  ? [facility]  ? CESAREAN SECTION N/A 12/27/2012  ? wom[facility] 2nd   ? NIPPLE SPARING MASTECTOMY Left 09/09/2020  ? Procedure: LEFT NIPPLE SPARING MASTECTOMY;  Surgeon: Rolm Bookbinder, MD;  Location: [facility];  Service: General;  Laterality: Left;  ? NIPPLE SPARING MASTECTOMY WITH SENTINEL LYMPH NODE BIOPSY Right 09/09/2020  ? Procedure: RIGHT NIPPLE SPARING MASTECTOMY WITH RIGHT AXILLARY SENTINEL LYMPH NODE BIOPSY;  Surgeon: Rolm Bookbinder, MD;  Location: [facility];  Service: General;  Laterality: Right;  ? PORTACATH PLACEMENT Right 02/04/2020  ? Procedure: INSERTION PORT-A-CATH WITH ULTRASOUND;  Surgeon: Rolm Bookbinder, MD;  Location: [facility];  Service: General;  Laterality: Right;  ?  REMOVAL OF BILATERAL TISSUE EXPANDERS WITH PLACEMENT OF BILATERAL BREAST IMPLANTS Bilateral 12/15/2020  ? Procedure: REMOVAL OF BILATERAL TISSUE EXPANDERS WITH PLACEMENT OF BILATERAL BREAST SILICONE IMPLANTS;  Surgeon: Irene Limbo, MD;  Location: [facility];  Service: Plastics;  Laterality: Bilateral;  ? TOTAL ABDOMINAL HYSTERECTOMY N/A   ? WISDOM TOOTH EXTRACTION    ? ? ?Social History  ? ?Socioeconomic History  ? Marital  status: Married  ?  Spouse name: Not on file  ? Number of children: 2  ? Years of education: Not on file  ? Highest education level: Not on file  ?Occupational History  ? Not on file  ?Tobacco Use  ? Smoking status: Never  ? Smokeless tobacco: Never  ?Vaping Use  ? Vaping Use: Never used  ?Substance and Sexual Activity  ? Alcohol use: Not Currently  ?  Alcohol/week: 2.0 - 3.0 standard drinks  ?  Types: 2 - 3 Standard drinks or equivalent per week  ? Drug use: Never  ? Sexual activity: Yes  ?  Partners: Male  ?  Birth control/protection: Pill, Condom  ?Other Topics Concern  ? Not on file  ?Social History Narrative  ? Home Situation: lives with husband, 63 yo and near 34 year old (10/2015)  ? Husband works as Marketing executive in radiation oncology  ?   ? 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   ? Spiritual Beliefs: Christian  ?   ? Lifestyle: exercising about 30-45 minutes 4-5 days per week; diet healthy  ?   ? Hobbies: exercise, crafting, church  ?   ? ?Social Determinants of Health  ? ?Financial Resource Strain: Not on file  ?Food Insecurity: Not on file  ?Transportation Needs: Not on file  ?Physical Activity: Not on file  ?Stress: Not on file  ?Social Connections: Not on file  ? ? ?Family History  ?Problem Relation Age of Onset  ? Hypertension Mother   ? Breast cancer Mother 85  ?     early 46's  ? Diabetes Father   ? Atrial fibrillation Father   ? Other Father   ?     biopsies on kidney- noncancerous. stated potentially precancerous  ? Prostate cancer Maternal Grandfather 90  ? Endometrial cancer Paternal Grandmother 22  ? Esophageal cancer Paternal Uncle   ?     dx early 29s  ? ? ?Health Maintenance  ?Topic Date Due  ? COVID-19 Vaccine (3 - Pfizer risk series) 09/10/2021 (Originally 05/22/2019)  ? Hepatitis C Screening  04/28/2022 (Originally 09/01/1999)  ? INFLUENZA VACCINE  08/10/2021  ? TETANUS/TDAP  10/11/2022  ? HIV Screening  Completed  ? HPV VACCINES  Aged Out   ? ? ? ?----------------------------------------------------------------------------------------------------------------------------------------------------------------------------------------------------------------- ?Physical Exam ?BP 117/81 (BP Location: Left Arm, Patient Position: Sitting, Cuff Size: Small)   Pulse (!) 59   Ht _0  (1.549 m)   Wt 119 lb 3.2 oz (54.1 kg)   LMP  (LMP Unknown)   SpO2 100%   BMI 22.52 kg/m?  ? ?Physical Exam ?Constitutional:   ?   General: She is not in acute distress. ?HENT:  ?   Head: Normocephalic and atraumatic.  ?   Right Ear: Tympanic membrane and ear canal normal.  ?   Left Ear: Tympanic membrane and ear canal normal.  ?   Nose: Nose normal.  ?Eyes:  ?   General: No scleral icterus. ?   Conjunctiva/sclera: Conjunctivae normal.  ?Neck:  ?   Thyroid:  No thyromegaly.  ?Cardiovascular:  ?   Rate and Rhythm: Normal rate and regular rhythm.  ?   Heart sounds: Normal heart sounds.  ?Pulmonary:  ?   Effort: Pulmonary effort is normal.  ?   Breath sounds: Normal breath sounds.  ?Abdominal:  ?   General: Bowel sounds are normal. There is no distension.  ?   Palpations: Abdomen is soft.  ?   Tenderness: There is no abdominal tenderness. There is no guarding.  ?Musculoskeletal:     ?   General: Normal range of motion.  ?   Cervical back: Normal range of motion and neck supple.  ?Lymphadenopathy:  ?   Cervical: No cervical adenopathy.  ?Skin: ?   General: Skin is warm and dry.  ?   Findings: No rash.  ?Neurological:  ?   General: No focal deficit present.  ?   Mental Status: She is alert and oriented to person, place, and time.  ?   Cranial Nerves: No cranial nerve deficit.  ?   Coordination: Coordination normal.  ?Psychiatric:     ?   Mood and Affect: Mood normal.     ?   Behavior: Behavior normal.   ? ? ?------------------------------------------------------------------------------------------------------------------------------------------------------------------------------------------------------------------- ?Assessment and Plan ? ?Well adult exam ?Well adult ?Orders Placed This Encounter  ?Procedures  ? COMPLETE METABOLIC PANEL WITH GFR  ? CBC with Differential  ? Lipid Panel w/reflex Direct LDL  ? TSH  ? B12  ?

## 2021-04-28 ENCOUNTER — Encounter: Payer: Self-pay | Admitting: Hematology and Oncology

## 2021-04-28 LAB — CBC WITH DIFFERENTIAL/PLATELET
Absolute Monocytes: 369 cells/uL (ref 200–950)
Basophils Absolute: 31 cells/uL (ref 0–200)
Basophils Relative: 0.6 %
Eosinophils Absolute: 31 cells/uL (ref 15–500)
Eosinophils Relative: 0.6 %
HCT: 41.8 % (ref 35.0–45.0)
Hemoglobin: 13.7 g/dL (ref 11.7–15.5)
Lymphs Abs: 2002 cells/uL (ref 850–3900)
MCH: 29.7 pg (ref 27.0–33.0)
MCHC: 32.8 g/dL (ref 32.0–36.0)
MCV: 90.7 fL (ref 80.0–100.0)
MPV: 10.2 fL (ref 7.5–12.5)
Monocytes Relative: 7.1 %
Neutro Abs: 2766 cells/uL (ref 1500–7800)
Neutrophils Relative %: 53.2 %
Platelets: 249 10*3/uL (ref 140–400)
RBC: 4.61 10*6/uL (ref 3.80–5.10)
RDW: 12.6 % (ref 11.0–15.0)
Total Lymphocyte: 38.5 %
WBC: 5.2 10*3/uL (ref 3.8–10.8)

## 2021-04-28 LAB — COMPLETE METABOLIC PANEL WITH GFR
AG Ratio: 1.5 (calc) (ref 1.0–2.5)
ALT: 18 U/L (ref 6–29)
AST: 26 U/L (ref 10–30)
Albumin: 4.9 g/dL (ref 3.6–5.1)
Alkaline phosphatase (APISO): 73 U/L (ref 31–125)
BUN: 14 mg/dL (ref 7–25)
CO2: 29 mmol/L (ref 20–32)
Calcium: 10.1 mg/dL (ref 8.6–10.2)
Chloride: 100 mmol/L (ref 98–110)
Creat: 0.65 mg/dL (ref 0.50–0.97)
Globulin: 3.3 g/dL (calc) (ref 1.9–3.7)
Glucose, Bld: 80 mg/dL (ref 65–99)
Potassium: 4.5 mmol/L (ref 3.5–5.3)
Sodium: 139 mmol/L (ref 135–146)
Total Bilirubin: 0.5 mg/dL (ref 0.2–1.2)
Total Protein: 8.2 g/dL — ABNORMAL HIGH (ref 6.1–8.1)
eGFR: 115 mL/min/{1.73_m2} (ref >=60)

## 2021-04-28 LAB — LIPID PANEL W/REFLEX DIRECT LDL
Cholesterol: 240 mg/dL — ABNORMAL HIGH (ref <200)
HDL: 108 mg/dL (ref >=50)
LDL Cholesterol (Calc): 118 mg/dL (calc) — ABNORMAL HIGH
Non-HDL Cholesterol (Calc): 132 mg/dL (calc) — ABNORMAL HIGH (ref <130)
Total CHOL/HDL Ratio: 2.2 (calc) (ref <5.0)
Triglycerides: 57 mg/dL (ref <150)

## 2021-04-28 LAB — VITAMIN B12: Vitamin B-12: 475 pg/mL (ref 200–1100)

## 2021-04-28 LAB — TSH: TSH: 0.51 mIU/L

## 2021-04-28 LAB — VITAMIN D 25 HYDROXY (VIT D DEFICIENCY, FRACTURES): Vit D, 25-Hydroxy: 72 ng/mL (ref 30–100)

## 2021-05-05 ENCOUNTER — Inpatient Hospital Stay: Payer: 59 | Attending: Hematology and Oncology | Admitting: Hematology and Oncology

## 2021-05-05 ENCOUNTER — Other Ambulatory Visit: Payer: 59

## 2021-05-05 ENCOUNTER — Other Ambulatory Visit: Payer: Self-pay

## 2021-05-05 DIAGNOSIS — C50411 Malignant neoplasm of upper-outer quadrant of right female breast: Secondary | ICD-10-CM | POA: Diagnosis not present

## 2021-05-05 DIAGNOSIS — Z170 Estrogen receptor positive status [ER+]: Secondary | ICD-10-CM | POA: Insufficient documentation

## 2021-05-05 DIAGNOSIS — Z79899 Other long term (current) drug therapy: Secondary | ICD-10-CM | POA: Insufficient documentation

## 2021-05-05 DIAGNOSIS — R5383 Other fatigue: Secondary | ICD-10-CM | POA: Diagnosis not present

## 2021-05-05 DIAGNOSIS — Z880 Allergy status to penicillin: Secondary | ICD-10-CM | POA: Insufficient documentation

## 2021-05-05 DIAGNOSIS — M2560 Stiffness of unspecified joint, not elsewhere classified: Secondary | ICD-10-CM | POA: Insufficient documentation

## 2021-05-05 NOTE — Assessment & Plan Note (Addendum)
01/28/2020: Palpable right breast lump: Mammogram and ultrasound revealed calcifications spanning 2 cm, biopsy revealed IDC with DCIS, grade 2, ER 5% weak, PR 0%, HER2 equivocal by IHC, FISH?negative ratio 1.67?Ki-67 10% ??? ?BRCA2 positive: Risk discussion regarding future breast cancer, and ovarian cancer risk, discussed risk reducing bilateral mastectomies and RRSO.?? ?12/15/2020: Patient completed surgery to replace the expanders with the implants. ?? ?Treatment plan: ?1. Neo- adjuvant chemotherapy with dose dense Adriamycin and Cytoxan/Keytruda?followed by Taxol and carboplatin?completed 08/18/2020 ?2.?bilateral mastectomies:09/09/2020: Left mastectomy: Negative for cancer, right mastectomy: Pathologic complete response 0/3 lymph nodes negative ?3.?Adjuvant antiestrogen therapy?(once patient undergoes hysterectomy and bilateral salpingo-oophorectomy, we will start aromatase inhibitor therapy) ?Based on extensive discussion back-and-forth we determined that she does not need adjuvant radiation. ??_______________________________________________________________________ ?Current treatment: Letrozole started March 2023 ?Letrozole toxicities: ?1.  Joint stiffness and achiness: She has been on restrictions because of the hysterectomy and she will start exercising soon.  We will see how she does once she can exercise better. ? ?Concerns in the breast: Breast examination does not reveal any clear-cut abnormalities to warrant further investigation at this time. ? ?We discussed the role of Signatera testing and early detection of recurrence of breast cancer ? ?Breast cancer surveillance: ?1.  Breast exam 05/05/2021: Benign ?2. no role of imaging studies since she had bilateral mastectomies ? ?Return to clinic in 1 year for follow-up ?

## 2021-05-06 ENCOUNTER — Telehealth: Payer: Self-pay | Admitting: Hematology and Oncology

## 2021-05-06 NOTE — Telephone Encounter (Signed)
Scheduled appointment per 4/26 los. Patient is aware. ?

## 2021-05-10 ENCOUNTER — Other Ambulatory Visit (HOSPITAL_COMMUNITY): Payer: Self-pay

## 2021-05-10 ENCOUNTER — Other Ambulatory Visit: Payer: Self-pay | Admitting: Osteopathic Medicine

## 2021-05-10 ENCOUNTER — Other Ambulatory Visit: Payer: Self-pay | Admitting: Family Medicine

## 2021-05-11 ENCOUNTER — Other Ambulatory Visit (HOSPITAL_COMMUNITY): Payer: Self-pay

## 2021-05-11 ENCOUNTER — Other Ambulatory Visit: Payer: Self-pay | Admitting: Family Medicine

## 2021-05-11 MED ORDER — ESCITALOPRAM OXALATE 10 MG PO TABS
10.0000 mg | ORAL_TABLET | Freq: Every day | ORAL | 3 refills | Status: DC
  Filled 2021-05-11: qty 90, 90d supply, fill #0
  Filled 2021-08-09: qty 90, 90d supply, fill #1
  Filled 2021-11-03: qty 90, 90d supply, fill #2
  Filled 2022-01-31: qty 90, 90d supply, fill #3

## 2021-05-11 NOTE — Telephone Encounter (Signed)
Pt lvm requesting medication refill needed by Wednesday.  ?

## 2021-05-17 ENCOUNTER — Encounter: Payer: Self-pay | Admitting: Hematology and Oncology

## 2021-05-17 ENCOUNTER — Encounter: Payer: Self-pay | Admitting: *Deleted

## 2021-05-17 ENCOUNTER — Other Ambulatory Visit: Payer: Self-pay | Admitting: *Deleted

## 2021-05-17 DIAGNOSIS — N898 Other specified noninflammatory disorders of vagina: Secondary | ICD-10-CM

## 2021-05-17 NOTE — Progress Notes (Signed)
Per MD request, RN successfully faxed Signatera orders [phone]).  ?

## 2021-05-21 ENCOUNTER — Encounter: Payer: Self-pay | Admitting: Hematology and Oncology

## 2021-05-21 DIAGNOSIS — C50419 Malignant neoplasm of upper-outer quadrant of unspecified female breast: Secondary | ICD-10-CM | POA: Diagnosis not present

## 2021-05-25 DIAGNOSIS — F4321 Adjustment disorder with depressed mood: Secondary | ICD-10-CM | POA: Diagnosis not present

## 2021-05-28 ENCOUNTER — Other Ambulatory Visit (HOSPITAL_COMMUNITY): Payer: Self-pay

## 2021-05-31 ENCOUNTER — Ambulatory Visit: Payer: 59 | Attending: Hematology and Oncology | Admitting: Physical Therapy

## 2021-05-31 ENCOUNTER — Encounter: Payer: Self-pay | Admitting: Physical Therapy

## 2021-05-31 DIAGNOSIS — R252 Cramp and spasm: Secondary | ICD-10-CM | POA: Insufficient documentation

## 2021-05-31 DIAGNOSIS — Z483 Aftercare following surgery for neoplasm: Secondary | ICD-10-CM | POA: Insufficient documentation

## 2021-05-31 DIAGNOSIS — N898 Other specified noninflammatory disorders of vagina: Secondary | ICD-10-CM | POA: Diagnosis not present

## 2021-05-31 NOTE — Therapy (Signed)
[facility] [address] [place], [place], [address] Phone: [phone]   Fax:  [phone]  Patient Details  Name: Sydney Herman MRN: [id number] Date of Birth: 01/24/1980 Referring Provider:  Gudena, Vinay, MD  Encounter Date: 05/31/2021  OUTPATIENT PHYSICAL THERAPY FEMALE PELVIC EVALUATION   Patient Name: Sydney Herman MRN: [id number] DOB:03/04/1980, 41 y.o., female Today's Date: 05/31/2021   PT End of Session - 05/31/21 1059     Visit Number 1    Date for PT Re-Evaluation 08/23/21    Authorization Type [facility]    PT Start Time 1100    PT Stop Time 1140    PT Time Calculation (min) 40 min    Activity Tolerance Patient tolerated treatment well    Behavior During Therapy WFL for tasks assessed/performed             Past Medical History:  Diagnosis Date   Abdominal wall mass of right lower quadrant 05/17/2013   Saw Dr. Tsuei 2015- was thought to be scar tissue- no growth since that time    Breast cancer (HCC) 01/23/2020   Right   Chicken pox    Family history of esophageal cancer    Family history of prostate cancer    Family history of uterine cancer    Generalized headaches    Has had some migraines in past as well. once a week or less. Usually occipital and associated with sinus pressure as well.    Heartburn in pregnancy    Past Surgical History:  Procedure Laterality Date   BREAST RECONSTRUCTION WITH PLACEMENT OF TISSUE EXPANDER AND ALLODERM Bilateral 09/09/2020   Procedure: BILATERAL BREAST RECONSTRUCTION WITH PLACEMENT OF TISSUE EXPANDER AND ALLODERM;  Surgeon: Thimmappa, Brinda, MD;  Location: [facility];  Service: Plastics;  Laterality: Bilateral;   CESAREAN SECTION  07/11/2010   [facility]   CESAREAN SECTION N/A 12/27/2012   [facility]    NIPPLE SPARING MASTECTOMY Left 09/09/2020   Procedure: LEFT NIPPLE SPARING MASTECTOMY;  Surgeon: Wakefield, Matthew, MD;  Location: [facility];  Service: General;  Laterality: Left;   NIPPLE SPARING MASTECTOMY WITH SENTINEL LYMPH NODE BIOPSY Right 09/09/2020   Procedure: RIGHT NIPPLE SPARING MASTECTOMY WITH RIGHT AXILLARY SENTINEL LYMPH NODE BIOPSY;  Surgeon: Wakefield, Matthew, MD;  Location: [facility];  Service: General;  Laterality: Right;   PORTACATH PLACEMENT Right 02/04/2020   Procedure: INSERTION PORT-A-CATH WITH ULTRASOUND;  Surgeon: Wakefield, Matthew, MD;  Location: [facility];  Service: General;  Laterality: Right;   REMOVAL OF BILATERAL TISSUE EXPANDERS WITH PLACEMENT OF BILATERAL BREAST IMPLANTS Bilateral 12/15/2020   Procedure: REMOVAL OF BILATERAL TISSUE EXPANDERS WITH PLACEMENT OF BILATERAL BREAST SILICONE IMPLANTS;  Surgeon: Thimmappa, Brinda, MD;  Location: [facility];  Service: Plastics;  Laterality: Bilateral;   TOTAL ABDOMINAL HYSTERECTOMY N/A    WISDOM TOOTH EXTRACTION     Patient Active Problem List   Diagnosis Date Noted   Well adult exam 04/27/2021   Adjustment reaction with anxiety 04/27/2021   Malignant tumor of breast (HCC) 05/12/2020   Port-A-Cath in place 03/19/2020   BRCA2 gene mutation positive 02/21/2020   Family history of prostate cancer    Family history of uterine cancer    Family history of esophageal cancer    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   History of abnormal cervical Pap smear 11/16/2018   Family history of type 2   diabetes mellitus in father 11/16/2018   Family history of breast cancer in mother 11/16/2018   Mild hyperlipidemia 11/02/2017   Vitamin B12 deficiency 02/20/2017   LGSIL (low grade squamous intraepithelial lesion) on Pap smear 10/02/2011    PCP: Matthews, Cody, MD  REFERRING PROVIDER: Gudena, Vinay, MD  REFERRING DIAG: N89.8 Vaginal Dryness  THERAPY DIAG:  Aftercare following surgery for neoplasm - Plan: PT plan of care cert/re-cert  Cramp and spasm - Plan: PT  plan of care cert/re-cert  Rationale for Evaluation and Treatment Rehabilitation  ONSET DATE: 01/23/2020  SUBJECTIVE:                                                                                                                                                                                  SUBJECTIVE STATEMENT: Patient reports vaginal dryness and painful intercourse. She reports it started when she was getting chemotherapy.     I have been to the [facility] program.  No urinary leakage. She has occasional constipation.  Fluid intake: Yes:      Patient confirms identification and approves PT to assess pelvic floor and treatment Yes   PAIN:  Are you having pain? Yes NPRS scale: 7/10 Pain location: Internal  Pain type: tight Pain description:  tearing    Aggravating factors: penile penetration Relieving factors: no penile penetration vaginally  PRECAUTIONS: Other: breast cancer  WEIGHT BEARING RESTRICTIONS No  FALLS:  Has patient fallen in last 6 months? No  LIVING ENVIRONMENT: Lives with: lives with their family  OCCUPATION: part time, marketing; workout with purre barre and walking  PLOF: Independent  PATIENT GOALS reduce pain and improve dryness  PERTINENT HISTORY:  Right breast cancer; C-section x2; hysterectomy Sexual abuse: No   INTERCOURSE Pain with intercourse: Initial Penetration, after intercourse and during Ability to have vaginal penetration:  No Climax: able to climax Marinoff Scale: 3/3  PREGNANCY C-section deliveries 2 Currently pregnant No     OBJECTIVE:   COGNITION:  Overall cognitive status: Within functional limits for tasks assessed     SENSATION:  Light touch: Appears intact  Proprioception: Appears intact     POSTURE:  good  LUMBARAROM/PROM lumbar ROM is full.    LOWER EXTREMITY ROM: Bilateral hip ROM is full.   LOWER EXTREMITY MMT:  MMT Right eval Left eval  Hip abduction 4/5 4/5  Hip adduction 4/5 4/5   PELVIC  MMT:   MMT eval  Vaginal 2/5 with less contraction on the sides  (Blank rows = not tested)        PALPATION:   General  Good mobility of the abdominal scars for the hysterectomy. Restrictions of the c-section scar                  External Perineal Exam vaginal dryness                             Internal Pelvic Floor tenderness located on the obturator internist, iliococcygeus; firmness  TONE: increased  PROLAPSE: none  TODAY'S TREATMENT  EVAL finished the evaluation   PATIENT EDUCATION:  Education details: educated patient on vaginal moisturizers, how to manually massage the vaginal muscles at the opening using lubricant Person educated: Patient Education method: Explanation, Demonstration, and Handouts Education comprehension: verbalized understanding   HOME EXERCISE PROGRAM: See patient education   ASSESSMENT:  CLINICAL IMPRESSION: Patient is a 40  y.o. female  who was seen today for physical therapy evaluation and treatment for vaginal dryness and painful intercourse. Patient s/p right breast cancer estrogen positive on 01/23/2020 with chemotherapy for treatment. She has had a hysterectomy recently. She started to have pain vaginally during chemotherapy.  Patient reports her vaginal pain is 7/10 and feels like tearing during penile penetration vaginally. She will feel the pain after intercourse. Marinoff score is 3/3. She has not had intercourse since her Hysterectomy. Patient has vaginal dryness. She has tenderness located on the obturator internist and iliococcygeus. Pelvic floor strength is 2/5 with decreased contraction laterally. Bilateral hip abduction and adduction is 4/5. Patient has good mobility of the hysterectomy scars and restrictions of her c-section scar. Patient reports no issues with urinary leakage. Patient will benefit from skilled therapy to improve pelvic floor elongation so she is able to have vaginal penetration vaginally.    OBJECTIVE IMPAIRMENTS  decreased activity tolerance, decreased coordination, decreased strength, increased fascial restrictions, increased muscle spasms, impaired tone, and pain.   ACTIVITY LIMITATIONS  vaginal penetration   PARTICIPATION LIMITATIONS:  interpersonal relationship  PERSONAL FACTORS 3+ comorbidities: right breast cancer 01/23/2020; hysterectomy, c-section x2  are also affecting patient's functional outcome.   REHAB POTENTIAL: Excellent  CLINICAL DECISION MAKING: Stable/uncomplicated  EVALUATION COMPLEXITY: Low   GOALS: Goals reviewed with patient? Yes  SHORT TERM GOALS: Target date: 06/28/2021  Patient educated on vaginal moisturizers and lubricants to improve vaginal health.  Baseline: Goal status: INITIAL  2.  Patient educated on perineal massage to assist with vaginal elongation.  Baseline:  Goal status: INITIAL  3.  Patient educated on diaphragmatic breathing to lengthen the pelvic floor.  Baseline:  Goal status: INITIAL   LONG TERM GOALS: Target date: 08/23/2021   Patient independent with advanced HEP for pelvic floor lengthening.  Baseline:  Goal status: INITIAL  2.  Patient is able to have penile penetration vaginally with 0-1/10 pain level and Marinoff score is 1/3.  Baseline:  Goal status: INITIAL  3.  Pelvic floor strength is 3/5 with full relaxation after contraction and ability to hug the therapist finger due to improved lengthening of the muscle.  Baseline:  Goal status: INITIAL  4.  Vaginal dryness improved >= 50% due to using vaginal moisturizers to improve tissue health.  Baseline:  Goal status: INITIAL  PLAN: PT FREQUENCY: 1x/week  PT DURATION: 12 weeks  PLANNED INTERVENTIONS: Therapeutic exercises, Therapeutic activity, Neuromuscular re-education, Patient/Family education, Joint mobilization, Dry Needling, Electrical stimulation, and Manual therapy  PLAN FOR NEXT SESSION: manual work to release outside the vaginal area then work internally, hip  stretches including happy baby, reverse clam, piriformis stretch, hip flexor stretch; diaphragmatic breathing      GRAY,CHERYL, PT 05/31/2021, 3:56 PM  [facility] [address] [place], [place],   [address] Phone: [phone]   Fax:  [phone] 

## 2021-05-31 NOTE — Patient Instructions (Signed)

## 2021-06-14 ENCOUNTER — Encounter: Payer: Self-pay | Admitting: Physical Therapy

## 2021-06-14 ENCOUNTER — Ambulatory Visit: Payer: 59 | Attending: Hematology and Oncology | Admitting: Physical Therapy

## 2021-06-14 DIAGNOSIS — R252 Cramp and spasm: Secondary | ICD-10-CM | POA: Insufficient documentation

## 2021-06-14 DIAGNOSIS — Z483 Aftercare following surgery for neoplasm: Secondary | ICD-10-CM | POA: Diagnosis not present

## 2021-06-14 NOTE — Therapy (Signed)
OUTPATIENT PHYSICAL THERAPY TREATMENT NOTE   Patient Name: Sydney Herman MRN: [id number] DOB:Apr 19, 1980, 41 y.o., female Today's Date: 06/14/2021  PCP: Luetta Nutting, MD REFERRING PROVIDER: Nicholas Lose, MD  END OF SESSION:   PT End of Session - 06/14/21 1149     Visit Number 2    Date for PT Re-Evaluation 08/23/21    Authorization Type [facility]    Authorization - Visit Number 2    Authorization - Number of Visits 24    PT Start Time 7341    PT Stop Time 1225    PT Time Calculation (min) 40 min    Activity Tolerance Patient tolerated treatment well    Behavior During Therapy [facility] for tasks assessed/performed             Past Medical History:  Diagnosis Date   Abdominal wall mass of right lower quadrant 05/17/2013   Saw Dr. Georgette Dover 2015- was thought to be scar tissue- no growth since that time    Breast cancer ([employer]) 01/23/2020   Right   Chicken pox    Family history of esophageal cancer    Family history of prostate cancer    Family history of uterine cancer    Generalized headaches    Has had some migraines in past as well. once a week or less. Usually occipital and associated with sinus pressure as well.    Heartburn in pregnancy    Past Surgical History:  Procedure Laterality Date   BREAST RECONSTRUCTION WITH PLACEMENT OF TISSUE EXPANDER AND ALLODERM Bilateral 09/09/2020   Procedure: BILATERAL BREAST RECONSTRUCTION WITH PLACEMENT OF TISSUE EXPANDER AND ALLODERM;  Surgeon: Irene Limbo, MD;  Location: [facility];  Service: Plastics;  Laterality: Bilateral;   CESAREAN SECTION  07/11/2010   [facility]   CESAREAN SECTION N/A 12/27/2012   [facility]    NIPPLE SPARING MASTECTOMY Left 09/09/2020   Procedure: LEFT NIPPLE SPARING MASTECTOMY;  Surgeon: Rolm Bookbinder, MD;  Location: [facility];  Service: General;  Laterality: Left;   NIPPLE SPARING MASTECTOMY WITH SENTINEL LYMPH NODE BIOPSY Right 09/09/2020   Procedure: RIGHT NIPPLE  SPARING MASTECTOMY WITH RIGHT AXILLARY SENTINEL LYMPH NODE BIOPSY;  Surgeon: Rolm Bookbinder, MD;  Location: [facility];  Service: General;  Laterality: Right;   PORTACATH PLACEMENT Right 02/04/2020   Procedure: INSERTION PORT-A-CATH WITH ULTRASOUND;  Surgeon: Rolm Bookbinder, MD;  Location: [facility];  Service: General;  Laterality: Right;   REMOVAL OF BILATERAL TISSUE EXPANDERS WITH PLACEMENT OF BILATERAL BREAST IMPLANTS Bilateral 12/15/2020   Procedure: REMOVAL OF BILATERAL TISSUE EXPANDERS WITH PLACEMENT OF BILATERAL BREAST SILICONE IMPLANTS;  Surgeon: Irene Limbo, MD;  Location: [facility];  Service: Plastics;  Laterality: Bilateral;   TOTAL ABDOMINAL HYSTERECTOMY N/A    WISDOM TOOTH EXTRACTION     Patient Active Problem List   Diagnosis Date Noted   Well adult exam 04/27/2021   Adjustment reaction with anxiety 04/27/2021   Malignant tumor of breast ([place]) 05/12/2020   Port-A-Cath in place 03/19/2020   BRCA2 gene mutation positive 02/21/2020   Family history of prostate cancer    Family history of uterine cancer    Family history of esophageal cancer    Malignant neoplasm of upper outer quadrant of female breast ([place]) 01/28/2020   History of abnormal cervical Pap smear 11/16/2018   Family history of type 2 diabetes mellitus in father 11/16/2018   Family history of breast cancer in mother 11/16/2018   Mild  hyperlipidemia 11/02/2017   Vitamin B12 deficiency 02/20/2017   LGSIL (low grade squamous intraepithelial lesion) on Pap smear 10/02/2011    REFERRING DIAG: N89.8 Vaginal Dryness  THERAPY DIAG:  Aftercare following surgery for neoplasm  Cramp and spasm  Rationale for Evaluation and Treatment Rehabilitation  PERTINENT HISTORY: Right breast cancer; C-section x2; hysterectomy  PRECAUTIONS: cancer  SUBJECTIVE: Have tried any vaginal penetration. I have been using the Good clean love to moisturize. I have been doing the  manual work.    PAIN:  Are you having pain? Yes NPRS scale: 7/10 Pain location: Internal   Pain type: tight Pain description:  tearing     Aggravating factors: penile penetration Relieving factors: no penile penetration vaginally  PATIENT GOALS reduce pain and improve dryness  INTERCOURSE Pain with intercourse: Initial Penetration, after intercourse and during Ability to have vaginal penetration:  No Climax: able to climax Marinoff Scale: 3/3    OBJECTIVE: (objective measures completed at initial evaluation unless otherwise dated) COGNITION:            Overall cognitive status: Within functional limits for tasks assessed                          SENSATION:            Light touch: Appears intact            Proprioception: Appears intact         POSTURE:  good   LUMBARAROM/PROM lumbar ROM is full.      LOWER EXTREMITY ROM: Bilateral hip ROM is full.    LOWER EXTREMITY MMT:   MMT Right eval Left eval  Hip abduction 4/5 4/5  Hip adduction 4/5 4/5    PELVIC MMT:   MMT eval  Vaginal 2/5 with less contraction on the sides  (Blank rows = not tested)         PALPATION:   General  Good mobility of the abdominal scars for the hysterectomy. Restrictions of the c-section scar                 External Perineal Exam vaginal dryness                             Internal Pelvic Floor tenderness located on the obturator internist, iliococcygeus; firmness   TONE: increased   PROLAPSE: none   TODAY'S TREATMENT  Manual: Myofascial release:release of the hip adductors, levator ani and urogenital diaphragm in sidely feeling for the restrictions and tightness to elongate the muscles.  Neuromuscular re-education: Down training: diaphragmatic breathing with tactile cues to open up the back of the rib cage and relax the pelvic floor Exercises: Stretches/mobility:happy baby 30 sec with diaphragmatic breathing    Trunk rotation holding 30 sec bil.     Supine hamstring  stretch bil. Holding 30 seconds    Quadruped cat cow 15x    Ardine Eng pose all 3 directions holding 30 sec    Reverse clam 5 times each side    06/14/2021 PATIENT EDUCATION: Education details: Access Code: [email or web address] Person educated: Patient Education method: Explanation, Demonstration, Tactile cues, Verbal cues, and Handouts Education comprehension: verbalized understanding, returned demonstration, verbal cues required, tactile cues required, and needs further education    PATIENT EDUCATION:  Education details: educated patient on vaginal moisturizers, how to manually massage the vaginal muscles at the opening using lubricant Person educated: Patient Education method: Consulting civil engineer, Demonstration,  and Handouts Education comprehension: verbalized understanding     HOME EXERCISE PROGRAM: 06/14/2021 Access Code: [email or web address] URL: [email or web address] Date: 06/14/2021 Prepared by: Earlie Counts  Exercises - Happy Baby with Pelvic Floor Lengthening  - 1 x daily - 7 x weekly - 1 sets - 1 reps - 30 sec hold - Supine Piriformis Stretch with Leg Straight  - 1 x daily - 7 x weekly - 1 sets - 2 reps - 30 sec hold - Supine Hamstring Stretch  - 1 x daily - 7 x weekly - 1 sets - 2 reps - 30 sec hold - Cat Cow  - 1 x daily - 7 x weekly - 1 sets - 10 reps - Child's Pose Stretch  - 1 x daily - 7 x weekly - 3 sets - 10 reps - Child's Pose with Sidebending  - 1 x daily - 7 x weekly - 1 sets - 2 reps - 30 sec hold - Sidelying Reverse Clamshell  - 1 x daily - 7 x weekly - 2 sets - 5 reps - Supine Diaphragmatic Breathing  - 1 x daily - 7 x weekly - 1 sets - 10 reps   ASSESSMENT:   CLINICAL IMPRESSION: Patient is a 41  y.o. female  who was seen today for physical therapy evaluation and treatment for vaginal dryness and painful intercourse. Patient is using her vaginal moisturizers and massaging the perineal area. Her tissue soften in the pelvic floor and there was not pain with the manual work. She  was able to do the diaphragmatic breathing but had difficulty with elongating the pelvic floor. Patient will benefit from skilled therapy to improve pelvic floor elongation so she is able to have vaginal penetration vaginally.      OBJECTIVE IMPAIRMENTS decreased activity tolerance, decreased coordination, decreased strength, increased fascial restrictions, increased muscle spasms, impaired tone, and pain.    ACTIVITY LIMITATIONS  vaginal penetration    PARTICIPATION LIMITATIONS:  interpersonal relationship   PERSONAL FACTORS 3+ comorbidities: right breast cancer 01/23/2020; hysterectomy, c-section x2  are also affecting patient's functional outcome.    REHAB POTENTIAL: Excellent   CLINICAL DECISION MAKING: Stable/uncomplicated   EVALUATION COMPLEXITY: Low     GOALS: Goals reviewed with patient? Yes   SHORT TERM GOALS: Target date: 06/28/2021   Patient educated on vaginal moisturizers and lubricants to improve vaginal health.  Baseline: Goal status: met 06/14/2021   2.  Patient educated on perineal massage to assist with vaginal elongation.  Baseline:  Goal status: met 06/14/2021   3.  Patient educated on diaphragmatic breathing to lengthen the pelvic floor.  Baseline:  Goal status: INITIAL     LONG TERM GOALS: Target date: 08/23/2021    Patient independent with advanced HEP for pelvic floor lengthening.  Baseline:  Goal status: INITIAL   2.  Patient is able to have penile penetration vaginally with 0-1/10 pain level and Marinoff score is 1/3.  Baseline:  Goal status: INITIAL   3.  Pelvic floor strength is 3/5 with full relaxation after contraction and ability to hug the therapist finger due to improved lengthening of the muscle.  Baseline:  Goal status: INITIAL   4.  Vaginal dryness improved >= 50% due to using vaginal moisturizers to improve tissue health.  Baseline:  Goal status: INITIAL   PLAN: PT FREQUENCY: 1x/week   PT DURATION: 12 weeks   PLANNED  INTERVENTIONS: Therapeutic exercises, Therapeutic activity, Neuromuscular re-education, Patient/Family education, Joint mobilization, Dry Needling, Electrical stimulation, and Manual therapy  PLAN FOR NEXT SESSION: manual work to release outside the vaginal area then work internally, abdominal work on scar     Earlie Counts, PT 06/14/21 12:31 PM

## 2021-06-16 DIAGNOSIS — H5213 Myopia, bilateral: Secondary | ICD-10-CM | POA: Diagnosis not present

## 2021-06-21 ENCOUNTER — Ambulatory Visit: Payer: 59 | Admitting: Physical Therapy

## 2021-06-21 ENCOUNTER — Encounter: Payer: Self-pay | Admitting: Physical Therapy

## 2021-06-21 DIAGNOSIS — R252 Cramp and spasm: Secondary | ICD-10-CM

## 2021-06-21 DIAGNOSIS — Z483 Aftercare following surgery for neoplasm: Secondary | ICD-10-CM

## 2021-06-21 NOTE — Therapy (Signed)
OUTPATIENT PHYSICAL THERAPY TREATMENT NOTE   Patient Name: Sydney Herman MRN: [id number] DOB:07/13/1980, 41 y.o., female Today's Date: 06/21/2021  PCP: Luetta Nutting, MD REFERRING PROVIDER: Nicholas Lose, MD  END OF SESSION:   PT End of Session - 06/21/21 0850     Visit Number 3    Date for PT Re-Evaluation 08/23/21    Authorization Type [facility]    Authorization - Visit Number 3    Authorization - Number of Visits 24    PT Start Time 0845    PT Stop Time 0925    PT Time Calculation (min) 40 min    Activity Tolerance Patient tolerated treatment well    Behavior During Therapy [facility] for tasks assessed/performed             Past Medical History:  Diagnosis Date   Abdominal wall mass of right lower quadrant 05/17/2013   Saw Dr. Georgette [place] 2015- was thought to be scar tissue- no growth since that time    Breast cancer ([place]) 01/23/2020   Right   Chicken pox    Family history of esophageal cancer    Family history of prostate cancer    Family history of uterine cancer    Generalized headaches    Has had some migraines in past as well. once a week or less. Usually occipital and associated with sinus pressure as well.    Heartburn in pregnancy    Past Surgical History:  Procedure Laterality Date   BREAST RECONSTRUCTION WITH PLACEMENT OF TISSUE EXPANDER AND ALLODERM Bilateral 09/09/2020   Procedure: BILATERAL BREAST RECONSTRUCTION WITH PLACEMENT OF TISSUE EXPANDER AND ALLODERM;  Surgeon: Irene Limbo, MD;  Location: [place];  Service: Plastics;  Laterality: Bilateral;   CESAREAN SECTION  07/11/2010   [facility]   CESAREAN SECTION N/A 12/27/2012   womens 2nd    NIPPLE SPARING MASTECTOMY Left 09/09/2020   Procedure: LEFT NIPPLE SPARING MASTECTOMY;  Surgeon: Rolm Bookbinder, MD;  Location: [place];  Service: General;  Laterality: Left;   NIPPLE SPARING MASTECTOMY WITH SENTINEL LYMPH NODE BIOPSY Right 09/09/2020   Procedure: RIGHT  NIPPLE SPARING MASTECTOMY WITH RIGHT AXILLARY SENTINEL LYMPH NODE BIOPSY;  Surgeon: Rolm Bookbinder, MD;  Location: [place];  Service: General;  Laterality: Right;   PORTACATH PLACEMENT Right 02/04/2020   Procedure: INSERTION PORT-A-CATH WITH ULTRASOUND;  Surgeon: Rolm Bookbinder, MD;  Location: [place];  Service: General;  Laterality: Right;   REMOVAL OF BILATERAL TISSUE EXPANDERS WITH PLACEMENT OF BILATERAL BREAST IMPLANTS Bilateral 12/15/2020   Procedure: REMOVAL OF BILATERAL TISSUE EXPANDERS WITH PLACEMENT OF BILATERAL BREAST SILICONE IMPLANTS;  Surgeon: Irene Limbo, MD;  Location: [place];  Service: Plastics;  Laterality: Bilateral;   TOTAL ABDOMINAL HYSTERECTOMY N/A    WISDOM TOOTH EXTRACTION     Patient Active Problem List   Diagnosis Date Noted   Well adult exam 04/27/2021   Adjustment reaction with anxiety 04/27/2021   Malignant tumor of breast ([place]) 05/12/2020   Port-A-Cath in place 03/19/2020   BRCA2 gene mutation positive 02/21/2020   Family history of prostate cancer    Family history of uterine cancer    Family history of esophageal cancer    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   History of abnormal cervical Pap smear 11/16/2018   Family history of type 2 diabetes mellitus in father 11/16/2018   Family history of breast cancer in mother 11/16/2018   Mild  hyperlipidemia 11/02/2017   Vitamin B12 deficiency 02/20/2017   LGSIL (low grade squamous intraepithelial lesion) on Pap smear 10/02/2011    REFERRING DIAG: N89.8 Vaginal Dryness  THERAPY DIAG:  Aftercare following surgery for neoplasm  Cramp and spasm  Rationale for Evaluation and Treatment Rehabilitation  PERTINENT HISTORY: Right breast cancer; C-section x2; hysterectomy  PRECAUTIONS: cancer  SUBJECTIVE: I am doing my exercises. Tried to have vaginal intercourse but tight.    PAIN:  Are you having  pain? Yes NPRS scale: 7/10 Pain location: Internal   Pain type: tight Pain description:  tearing     Aggravating factors: penile penetration Relieving factors: no penile penetration vaginally   PATIENT GOALS reduce pain and improve dryness  INTERCOURSE Pain with intercourse: Initial Penetration, after intercourse and during Ability to have vaginal penetration:  No Climax: able to climax Marinoff Scale: 3/3  OBJECTIVE: (objective measures completed at initial evaluation unless otherwise dated)  COGNITION:            Overall cognitive status: Within functional limits for tasks assessed                          SENSATION:            Light touch: Appears intact            Proprioception: Appears intact      POSTURE:  good   LUMBARAROM/PROM lumbar ROM is full.      LOWER EXTREMITY ROM: Bilateral hip ROM is full.    LOWER EXTREMITY MMT:   MMT Right eval Left eval  Hip abduction 4/5 4/5  Hip adduction 4/5 4/5    PELVIC MMT:   MMT eval  Vaginal 2/5 with less contraction on the sides  (Blank rows = not tested)         PALPATION:   General  Good mobility of the abdominal scars for the hysterectomy. Restrictions of the c-section scar                 External Perineal Exam vaginal dryness                             Internal Pelvic Floor tenderness located on the obturator internist, iliococcygeus; firmness   TONE: increased   PROLAPSE: none   TODAY'S TREATMENT  06/21/2021 Manual: Soft tissue mobilization:manual work to bilateral hip adductors in sidely, manual work along the pubic rami and and around the labia Exercises: Stretches/mobility: standing with legs in a v and  flex at trunk with hands on mat holding 30 sec    V stance then lit arm in air 3 times each arm     Sit on yoga block with legs apart holding for 30 sec    Quadruped with hips IR and rock back and forth.    Pigeon pose with moving trunk to leg forward side.  Strengthening: Nustep 5 min,  level 5 while assessing patient.  Self-care: Educated patient on vaginal dilators and gave her choices of different ones.  Educated patient on how to perform manual work to the labia minora and majora.     06/14/2021 Manual: Myofascial release:release of the hip adductors, levator ani and urogenital diaphragm in sidely feeling for the restrictions and tightness to elongate the muscles.  Neuromuscular re-education: Down training: diaphragmatic breathing with tactile cues to open up the back of the rib cage and relax the pelvic floor  Exercises: Stretches/mobility:happy baby 30 sec with diaphragmatic breathing                                     Trunk rotation holding 30 sec bil.                                      Supine hamstring stretch bil. Holding 30 seconds                                     Quadruped cat cow 15x                                     Ardine Eng pose all 3 directions holding 30 sec                                     Reverse clam 5 times each side      06/14/2021 PATIENT EDUCATION: Education details: Access Code: [email or web address] Person educated: Patient Education method: Explanation, Demonstration, Tactile cues, Verbal cues, and Handouts Education comprehension: verbalized understanding, returned demonstration, verbal cues required, tactile cues required, and needs further education     PATIENT EDUCATION:  Education details: educated patient on vaginal moisturizers, how to manually massage the vaginal muscles at the opening using lubricant Person educated: Patient Education method: Explanation, Demonstration, and Handouts Education comprehension: verbalized understanding     HOME EXERCISE PROGRAM: 06/14/2021 Access Code: [email or web address] URL: [email or web address] Date: 06/14/2021 Prepared by: Earlie Counts   Exercises - Happy Baby with Pelvic Floor Lengthening  - 1 x daily - 7 x weekly - 1 sets - 1 reps - 30 sec hold - Supine Piriformis Stretch with Leg Straight  -  1 x daily - 7 x weekly - 1 sets - 2 reps - 30 sec hold - Supine Hamstring Stretch  - 1 x daily - 7 x weekly - 1 sets - 2 reps - 30 sec hold - Cat Cow  - 1 x daily - 7 x weekly - 1 sets - 10 reps - Child's Pose Stretch  - 1 x daily - 7 x weekly - 3 sets - 10 reps - Child's Pose with Sidebending  - 1 x daily - 7 x weekly - 1 sets - 2 reps - 30 sec hold - Sidelying Reverse Clamshell  - 1 x daily - 7 x weekly - 2 sets - 5 reps - Supine Diaphragmatic Breathing  - 1 x daily - 7 x weekly - 1 sets - 10 reps     ASSESSMENT:   CLINICAL IMPRESSION: Patient is a 41  y.o. female  who was seen today for physical therapy  treatment for vaginal dryness and painful intercourse. Patient was educated on different dilators so she can purchase a set. Patient was able to tolerate the the manual work to the labia.  Patient had tight hip adductors. Patient will benefit from skilled therapy to improve pelvic floor elongation so she is able to have vaginal penetration vaginally.      OBJECTIVE IMPAIRMENTS decreased activity tolerance, decreased coordination, decreased strength, increased fascial restrictions, increased muscle spasms,  impaired tone, and pain.    ACTIVITY LIMITATIONS  vaginal penetration    PARTICIPATION LIMITATIONS:  interpersonal relationship   PERSONAL FACTORS 3+ comorbidities: right breast cancer 01/23/2020; hysterectomy, c-section x2  are also affecting patient's functional outcome.    REHAB POTENTIAL: Excellent   CLINICAL DECISION MAKING: Stable/uncomplicated   EVALUATION COMPLEXITY: Low     GOALS: Goals reviewed with patient? Yes   SHORT TERM GOALS: Target date: 06/28/2021   Patient educated on vaginal moisturizers and lubricants to improve vaginal health.  Baseline: Goal status: met 06/14/2021   2.  Patient educated on perineal massage to assist with vaginal elongation.  Baseline:  Goal status: met 06/14/2021   3.  Patient educated on diaphragmatic breathing to lengthen the pelvic  floor.  Baseline:  Goal status: INITIAL     LONG TERM GOALS: Target date: 08/23/2021    Patient independent with advanced HEP for pelvic floor lengthening.  Baseline:  Goal status: INITIAL   2.  Patient is able to have penile penetration vaginally with 0-1/10 pain level and Marinoff score is 1/3.  Baseline:  Goal status: INITIAL   3.  Pelvic floor strength is 3/5 with full relaxation after contraction and ability to hug the therapist finger due to improved lengthening of the muscle.  Baseline:  Goal status: INITIAL   4.  Vaginal dryness improved >= 50% due to using vaginal moisturizers to improve tissue health.  Baseline:  Goal status: INITIAL   PLAN: PT FREQUENCY: 1x/week   PT DURATION: 12 weeks   PLANNED INTERVENTIONS: Therapeutic exercises, Therapeutic activity, Neuromuscular re-education, Patient/Family education, Joint mobilization, Dry Needling, Electrical stimulation, and Manual therapy   PLAN FOR NEXT SESSION: manual work to release outside the vaginal area then work internally, abdominal work on scar, diaphragmatic breathing ask her about the dilators     Earlie Counts, PT 06/21/21 9:29 AM

## 2021-06-21 NOTE — Patient Instructions (Signed)
Pelvic Floor Vaginal dilators                                                             Amielle Restore Vaginal Dilator Kit                                                        Vulva Tech                                                                              Restore                                                                                                                   Soul Source                                                                                    Intimate Rose                                                                                        Inspire Silicone Dilator Set                                                                                 V Well  dilator set                                                                                             Milli Dilator that you pump                                                                               Berman dilator                                                                                             [place] dilators                                                                  Vaginismus Vaginal dilators                                                    Oh Nut for deep vaginal penetration limitation  Most of these dilators you can get on [employer]. The ones you are not able to do then look at the company website or [email or web address]

## 2021-06-28 ENCOUNTER — Ambulatory Visit: Payer: 59 | Admitting: Physical Therapy

## 2021-06-28 ENCOUNTER — Encounter: Payer: Self-pay | Admitting: Physical Therapy

## 2021-06-28 DIAGNOSIS — Z483 Aftercare following surgery for neoplasm: Secondary | ICD-10-CM | POA: Diagnosis not present

## 2021-06-28 DIAGNOSIS — R252 Cramp and spasm: Secondary | ICD-10-CM | POA: Diagnosis not present

## 2021-06-28 NOTE — Therapy (Signed)
OUTPATIENT PHYSICAL THERAPY TREATMENT NOTE   Patient Name: Sydney Herman MRN: [id number] DOB:Nov 19, 1980, 41 y.o., female Today's Date: 06/28/2021  PCP: Luetta Nutting, MD REFERRING PROVIDER: Nicholas Lose, MD  END OF SESSION:   PT End of Session - 06/28/21 1102     Visit Number 4    Date for PT Re-Evaluation 08/23/21    Authorization Type [facility]    Authorization - Visit Number 4    Authorization - Number of Visits 24    PT Start Time 1100    PT Stop Time 1140    PT Time Calculation (min) 40 min    Activity Tolerance Patient tolerated treatment well    Behavior During Therapy [facility] for tasks assessed/performed             Past Medical History:  Diagnosis Date   Abdominal wall mass of right lower quadrant 05/17/2013   Saw Dr. Georgette Dover 2015- was thought to be scar tissue- no growth since that time    Breast cancer ([place]) 01/23/2020   Right   Chicken pox    Family history of esophageal cancer    Family history of prostate cancer    Family history of uterine cancer    Generalized headaches    Has had some migraines in past as well. once a week or less. Usually occipital and associated with sinus pressure as well.    Heartburn in pregnancy    Past Surgical History:  Procedure Laterality Date   BREAST RECONSTRUCTION WITH PLACEMENT OF TISSUE EXPANDER AND ALLODERM Bilateral 09/09/2020   Procedure: BILATERAL BREAST RECONSTRUCTION WITH PLACEMENT OF TISSUE EXPANDER AND ALLODERM;  Surgeon: Irene Limbo, MD;  Location: [place];  Service: Plastics;  Laterality: Bilateral;   CESAREAN SECTION  07/11/2010   [facility]   CESAREAN SECTION N/A 12/27/2012   [facility] 2nd    NIPPLE SPARING MASTECTOMY Left 09/09/2020   Procedure: LEFT NIPPLE SPARING MASTECTOMY;  Surgeon: Rolm Bookbinder, MD;  Location: [place];  Service: General;  Laterality: Left;   NIPPLE SPARING MASTECTOMY WITH SENTINEL LYMPH NODE BIOPSY Right 09/09/2020   Procedure: RIGHT  NIPPLE SPARING MASTECTOMY WITH RIGHT AXILLARY SENTINEL LYMPH NODE BIOPSY;  Surgeon: Rolm Bookbinder, MD;  Location: [place];  Service: General;  Laterality: Right;   PORTACATH PLACEMENT Right 02/04/2020   Procedure: INSERTION PORT-A-CATH WITH ULTRASOUND;  Surgeon: Rolm Bookbinder, MD;  Location: [place];  Service: General;  Laterality: Right;   REMOVAL OF BILATERAL TISSUE EXPANDERS WITH PLACEMENT OF BILATERAL BREAST IMPLANTS Bilateral 12/15/2020   Procedure: REMOVAL OF BILATERAL TISSUE EXPANDERS WITH PLACEMENT OF BILATERAL BREAST SILICONE IMPLANTS;  Surgeon: Irene Limbo, MD;  Location: [place];  Service: Plastics;  Laterality: Bilateral;   TOTAL ABDOMINAL HYSTERECTOMY N/A    WISDOM TOOTH EXTRACTION     Patient Active Problem List   Diagnosis Date Noted   Well adult exam 04/27/2021   Adjustment reaction with anxiety 04/27/2021   Malignant tumor of breast ([place]) 05/12/2020   Port-A-Cath in place 03/19/2020   BRCA2 gene mutation positive 02/21/2020   Family history of prostate cancer    Family history of uterine cancer    Family history of esophageal cancer    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   History of abnormal cervical Pap smear 11/16/2018   Family history of type 2 diabetes mellitus in father 11/16/2018   Family history of breast cancer in mother 11/16/2018   Mild  hyperlipidemia 11/02/2017   Vitamin B12 deficiency 02/20/2017   LGSIL (low grade squamous intraepithelial lesion) on Pap smear 10/02/2011  REFERRING DIAG: N89.8 Vaginal Dryness   THERAPY DIAG:  Aftercare following surgery for neoplasm   Cramp and spasm   Rationale for Evaluation and Treatment Rehabilitation   PERTINENT HISTORY: Right breast cancer; C-section x2; hysterectomy   PRECAUTIONS: cancer   SUBJECTIVE: Some of the stuff we are doing is helping.  Not noticing the dryness at all. Have nto tried  intercourse yet.    PAIN:  Are you having pain? Yes NPRS scale: 7/10 Pain location: Internal   Pain type: tight Pain description:  tearing     Aggravating factors: penile penetration Relieving factors: no penile penetration vaginally   PATIENT GOALS reduce pain and improve dryness   INTERCOURSE Pain with intercourse: Initial Penetration, after intercourse and during Ability to have vaginal penetration:  No Climax: able to climax Marinoff Scale: 3/3   OBJECTIVE: (objective measures completed at initial evaluation unless otherwise dated)   COGNITION:            Overall cognitive status: Within functional limits for tasks assessed                          SENSATION:            Light touch: Appears intact            Proprioception: Appears intact      POSTURE:  good   LUMBARAROM/PROM lumbar ROM is full.      LOWER EXTREMITY ROM: Bilateral hip ROM is full.    LOWER EXTREMITY MMT:   MMT Right eval Left eval  Hip abduction 4/5 4/5  Hip adduction 4/5 4/5    PELVIC MMT:   MMT eval  Vaginal 2/5 with less contraction on the sides  (Blank rows = not tested)         PALPATION:   General  Good mobility of the abdominal scars for the hysterectomy. Restrictions of the c-section scar                 External Perineal Exam vaginal dryness                             Internal Pelvic Floor tenderness located on the obturator internist, iliococcygeus; firmness   TONE: increased   PROLAPSE: none   TODAY'S TREATMENT  06/28/2021 .Manual: Soft tissue mobilization:to bilateral hip adductors  with tissue rolling; manual work to the urogenital diaphragm to release the tissue Internal pelvic floor techniques:No emotional/communication barriers or cognitive limitation. Patient is motivated to learn. Patient understands and agrees with treatment goals and plan. PT explains patient will be examined in standing, sitting, and lying down to see how their muscles and joints work.  When they are ready, they will be asked to remove their underwear so PT can examine their perineum. The patient is also given the option of providing their own chaperone as one is not provided in our facility. The patient also has the right and is explained the right to defer or refuse any part of the evaluation or treatment including the internal exam. With the patient's consent, PT will use one gloved finger to gently assess the muscles of the pelvic floor, seeing how well it contracts and relaxes and if there is muscle symmetry. After, the patient will get dressed and PT and patient will  discuss exam findings and plan of care. PT and patient discuss plan of care, schedule, attendance policy and HEP activities.  Manual work around the labia majora and minora monitoring for pain then educated patient on how to perform on herself to work on elongating the tissue and to be used to the therapist working on the area. Also had a mirror for the patient to be educated on the vulva, clitoral and perineal area Exercises: Stretches/mobility:cat cow 10x    Pigeon pose hold 30 sec bil.     Quadruped with therapist putting pressure on the anococcygeal ligament and moving pelvis in different directions    Feet wide and bend at hips hold 30  sec then reach up with hands to rotated spine    06/21/2021 Manual: Soft tissue mobilization:manual work to bilateral hip adductors in sidely, manual work along the pubic rami and and around the labia Exercises: Stretches/mobility: standing with legs in a v and  flex at trunk with hands on mat holding 30 sec                                     V stance then lit arm in air 3 times each arm                                      Sit on yoga block with legs apart holding for 30 sec                                     Quadruped with hips IR and rock back and forth.                                     Pigeon pose with moving trunk to leg forward side.  Strengthening: Nustep 5 min,  level 5 while assessing patient.  Self-care: Educated patient on vaginal dilators and gave her choices of different ones.  Educated patient on how to perform manual work to the labia minora and majora.      06/14/2021 Manual: Myofascial release:release of the hip adductors, levator ani and urogenital diaphragm in sidely feeling for the restrictions and tightness to elongate the muscles.  Neuromuscular re-education: Down training: diaphragmatic breathing with tactile cues to open up the back of the rib cage and relax the pelvic floor Exercises: Stretches/mobility:happy baby 30 sec with diaphragmatic breathing                                     Trunk rotation holding 30 sec bil.                                      Supine hamstring stretch bil. Holding 30 seconds                                     Quadruped cat cow 15x  Ardine Eng pose all 3 directions holding 30 sec                                     Reverse clam 5 times each side      PATIENT EDUCATION: 06/28/2021 Education details: education on masaging the perineal area and around the labias and clitoral hood Person educated: Patient Education method: Explanation, Demonstration, Tactile cues, and Verbal cues Education comprehension: verbalized understanding, returned demonstration, verbal cues required, tactile cues required, and needs further education       HOME EXERCISE PROGRAM: 06/14/2021 Access Code: [email or web address] URL: [email or web address] Date: 06/14/2021 Prepared by: Earlie Counts   Exercises - Happy Baby with Pelvic Floor Lengthening  - 1 x daily - 7 x weekly - 1 sets - 1 reps - 30 sec hold - Supine Piriformis Stretch with Leg Straight  - 1 x daily - 7 x weekly - 1 sets - 2 reps - 30 sec hold - Supine Hamstring Stretch  - 1 x daily - 7 x weekly - 1 sets - 2 reps - 30 sec hold - Cat Cow  - 1 x daily - 7 x weekly - 1 sets - 10 reps - Child's Pose Stretch  - 1 x daily - 7 x weekly -  3 sets - 10 reps - Child's Pose with Sidebending  - 1 x daily - 7 x weekly - 1 sets - 2 reps - 30 sec hold - Sidelying Reverse Clamshell  - 1 x daily - 7 x weekly - 2 sets - 5 reps - Supine Diaphragmatic Breathing  - 1 x daily - 7 x weekly - 1 sets - 10 reps     ASSESSMENT:   CLINICAL IMPRESSION: Patient is a 41  y.o. female  who was seen today for physical therapy  treatment for vaginal dryness and painful intercourse. Patient is not feeling the dryness of the perineal body due to moisturizing daily. She has learned how to perform manual work to the perineum.  Patient has tightness in the right hp adductors and left urogenital diaphragm. Patient has not gotten the dilators yet due to being on vacation. Patient is getting comfortable with manual work around the perineum. Patient will benefit from skilled therapy to improve pelvic floor elongation so she is able to have vaginal penetration vaginally.      OBJECTIVE IMPAIRMENTS decreased activity tolerance, decreased coordination, decreased strength, increased fascial restrictions, increased muscle spasms, impaired tone, and pain.    ACTIVITY LIMITATIONS  vaginal penetration    PARTICIPATION LIMITATIONS:  interpersonal relationship   PERSONAL FACTORS 3+ comorbidities: right breast cancer 01/23/2020; hysterectomy, c-section x2  are also affecting patient's functional outcome.    REHAB POTENTIAL: Excellent   CLINICAL DECISION MAKING: Stable/uncomplicated   EVALUATION COMPLEXITY: Low     GOALS: Goals reviewed with patient? Yes   SHORT TERM GOALS: Target date: 06/28/2021   Patient educated on vaginal moisturizers and lubricants to improve vaginal health.  Baseline: Goal status: met 06/14/2021   2.  Patient educated on perineal massage to assist with vaginal elongation.  Baseline:  Goal status: met 06/14/2021   3.  Patient educated on diaphragmatic breathing to lengthen the pelvic floor.  Baseline:  Goal status: INITIAL     LONG TERM  GOALS: Target date: 08/23/2021    Patient independent with advanced HEP for pelvic floor lengthening.  Baseline:  Goal status: INITIAL   2.  Patient is able to have penile penetration vaginally with 0-1/10 pain level and Marinoff score is 1/3.  Baseline:  Goal status: INITIAL   3.  Pelvic floor strength is 3/5 with full relaxation after contraction and ability to hug the therapist finger due to improved lengthening of the muscle.  Baseline:  Goal status: INITIAL   4.  Vaginal dryness improved >= 50% due to using vaginal moisturizers to improve tissue health.  Baseline:  Goal status: met 06/28/2021   PLAN: PT FREQUENCY: 1x/week   PT DURATION: 12 weeks   PLANNED INTERVENTIONS: Therapeutic exercises, Therapeutic activity, Neuromuscular re-education, Patient/Family education, Joint mobilization, Dry Needling, Electrical stimulation, and Manual therapy   PLAN FOR NEXT SESSION: manual work to release outside the vaginal area then work internally, check hip strength, diaphragmatic breathing ask her about the dilators     Earlie Counts, PT 06/28/21 11:52 AM

## 2021-07-01 ENCOUNTER — Other Ambulatory Visit: Payer: Self-pay | Admitting: Nurse Practitioner

## 2021-07-07 ENCOUNTER — Ambulatory Visit: Payer: 59 | Admitting: Physical Therapy

## 2021-07-07 ENCOUNTER — Encounter: Payer: Self-pay | Admitting: Physical Therapy

## 2021-07-07 DIAGNOSIS — R252 Cramp and spasm: Secondary | ICD-10-CM | POA: Diagnosis not present

## 2021-07-07 DIAGNOSIS — Z483 Aftercare following surgery for neoplasm: Secondary | ICD-10-CM | POA: Diagnosis not present

## 2021-07-07 NOTE — Patient Instructions (Signed)
PROTOCOL FOR VAGINAL DILATORS   Wash dilator with soap and water prior to insertion.    Lay on your back reclined. Knees are to be up and apart while on your bed or in the bathtub with warm water.   Lubricate the end of the dilator with a water-soluble lubricant.  Separate the labia.   Tense the pelvic floor muscles than relax; while relaxing, slide lubricated dilator( round side of dilator) into the vagina.  Insert toward the direction of your spine. Dilator should feel snug and no pain more than 3/10.   Tense muscles again while holding the dilator so it does not get pushed out; relax and slide it in a little further.   Try blowing out as if filling a balloon; this may relax the muscles and allow penetration.  Repeat blowing out to insert dilator further.  Keep dilator in for 10 minutes if tolerate, with the pelvic floor muscles relaxed to further stretch the canal.   Never force the dilator into the canal. Once the dilator is comfortable start to move in and out, side to side, move your hips in different directions  3-4 times per week Progression of dilator.  When you are able to place dilator into vaginal canal and feel no pain or able to move without difficulty you are ready for the next size.  Before you go to the next size start with the original size for 2 minutes then use the next size up for 5 minutes. When the next size up is easy to use, do not have to start with the smaller size.   [facility] [address], [address] [place], [place] [address] Phone # [phone] Fax [phone]  

## 2021-07-07 NOTE — Therapy (Signed)
OUTPATIENT PHYSICAL THERAPY TREATMENT NOTE   Patient Name: Sydney Herman MRN: [id number] DOB:09-Jun-1980, 41 y.o., female Today's Date: 07/07/2021  PCP: Luetta Nutting, MD REFERRING PROVIDER: Nicholas Lose, MD  END OF SESSION:   PT End of Session - 07/07/21 1104     Visit Number 5    Date for PT Re-Evaluation 08/23/21    Authorization Type [facility]    Authorization - Visit Number 5    Authorization - Number of Visits 24    PT Start Time 1100    PT Stop Time 1140    PT Time Calculation (min) 40 min    Activity Tolerance Patient tolerated treatment well    Behavior During Therapy [facility] for tasks assessed/performed             Past Medical History:  Diagnosis Date   Abdominal wall mass of right lower quadrant 05/17/2013   Saw Dr. Georgette Dover 2015- was thought to be scar tissue- no growth since that time    Breast cancer ([place]) 01/23/2020   Right   Chicken pox    Family history of esophageal cancer    Family history of prostate cancer    Family history of uterine cancer    Generalized headaches    Has had some migraines in past as well. once a week or less. Usually occipital and associated with sinus pressure as well.    Heartburn in pregnancy    Past Surgical History:  Procedure Laterality Date   BREAST RECONSTRUCTION WITH PLACEMENT OF TISSUE EXPANDER AND ALLODERM Bilateral 09/09/2020   Procedure: BILATERAL BREAST RECONSTRUCTION WITH PLACEMENT OF TISSUE EXPANDER AND ALLODERM;  Surgeon: Irene Limbo, MD;  Location: [place];  Service: Plastics;  Laterality: Bilateral;   CESAREAN SECTION  07/11/2010   [facility]   CESAREAN SECTION N/A 12/27/2012   womens 2nd    NIPPLE SPARING MASTECTOMY Left 09/09/2020   Procedure: LEFT NIPPLE SPARING MASTECTOMY;  Surgeon: Rolm Bookbinder, MD;  Location: [place];  Service: General;  Laterality: Left;   NIPPLE SPARING MASTECTOMY WITH SENTINEL LYMPH NODE BIOPSY Right 09/09/2020   Procedure: RIGHT  NIPPLE SPARING MASTECTOMY WITH RIGHT AXILLARY SENTINEL LYMPH NODE BIOPSY;  Surgeon: Rolm Bookbinder, MD;  Location: [place];  Service: General;  Laterality: Right;   PORTACATH PLACEMENT Right 02/04/2020   Procedure: INSERTION PORT-A-CATH WITH ULTRASOUND;  Surgeon: Rolm Bookbinder, MD;  Location: [place];  Service: General;  Laterality: Right;   REMOVAL OF BILATERAL TISSUE EXPANDERS WITH PLACEMENT OF BILATERAL BREAST IMPLANTS Bilateral 12/15/2020   Procedure: REMOVAL OF BILATERAL TISSUE EXPANDERS WITH PLACEMENT OF BILATERAL BREAST SILICONE IMPLANTS;  Surgeon: Irene Limbo, MD;  Location: [place];  Service: Plastics;  Laterality: Bilateral;   TOTAL ABDOMINAL HYSTERECTOMY N/A    WISDOM TOOTH EXTRACTION     Patient Active Problem List   Diagnosis Date Noted   Well adult exam 04/27/2021   Adjustment reaction with anxiety 04/27/2021   Malignant tumor of breast ([place]) 05/12/2020   Port-A-Cath in place 03/19/2020   BRCA2 gene mutation positive 02/21/2020   Family history of prostate cancer    Family history of uterine cancer    Family history of esophageal cancer    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   History of abnormal cervical Pap smear 11/16/2018   Family history of type 2 diabetes mellitus in father 11/16/2018   Family history of breast cancer in mother 11/16/2018   Mild  hyperlipidemia 11/02/2017   Vitamin B12 deficiency 02/20/2017   LGSIL (low grade squamous intraepithelial lesion) on Pap smear 10/02/2011   REFERRING DIAG: N89.8 Vaginal Dryness   THERAPY DIAG:  Aftercare following surgery for neoplasm   Cramp and spasm   Rationale for Evaluation and Treatment Rehabilitation   PERTINENT HISTORY: Right breast cancer; C-section x2; hysterectomy   PRECAUTIONS: cancer   SUBJECTIVE: Had vaginal penetration but not able to go all the way in. I have been doing the manual work.    PAIN:  Are you having pain? Yes NPRS scale: 7/10 Pain location: Internal   Pain type: tight Pain description:  tearing     Aggravating factors: penile penetration Relieving factors: no penile penetration vaginally   PATIENT GOALS reduce pain and improve dryness   INTERCOURSE Pain with intercourse: Initial Penetration, after intercourse and during Ability to have vaginal penetration:  No Climax: able to climax Marinoff Scale: 3/3   OBJECTIVE: (objective measures completed at initial evaluation unless otherwise dated)   COGNITION:            Overall cognitive status: Within functional limits for tasks assessed                          SENSATION:            Light touch: Appears intact            Proprioception: Appears intact      POSTURE:  good   LUMBARAROM/PROM lumbar ROM is full.      LOWER EXTREMITY ROM: Bilateral hip ROM is full.    LOWER EXTREMITY MMT:   MMT Right eval Left eval  Hip abduction 4/5 4/5  Hip adduction 4/5 4/5    PELVIC MMT:   MMT eval  Vaginal 2/5 with less contraction on the sides  (Blank rows = not tested)         PALPATION:   General  Good mobility of the abdominal scars for the hysterectomy. Restrictions of the c-section scar                 External Perineal Exam vaginal dryness                             Internal Pelvic Floor tenderness located on the obturator internist, iliococcygeus; firmness   TONE: increased   PROLAPSE: none   TODAY'S TREATMENT  07/07/2021 Manual: Soft tissue mobilization: to bilateral hip adductors and along the pubic rami.  Internal pelvic floor techniques:No emotional/communication barriers or cognitive limitation. Patient is motivated to learn. Patient understands and agrees with treatment goals and plan. PT explains patient will be examined in standing, sitting, and lying down to see how their muscles and joints work. When they are ready, they will be asked to remove their underwear so PT can  examine their perineum. The patient is also given the option of providing their own chaperone as one is not provided in our facility. The patient also has the right and is explained the right to defer or refuse any part of the evaluation or treatment including the internal exam. With the patient's consent, PT will use one gloved finger to gently assess the muscles of the pelvic floor, seeing how well it contracts and relaxes and if there is muscle symmetry. After, the patient will get dressed and PT and patient will discuss exam findings and plan of care. PT and  patient discuss plan of care, schedule, attendance policy and HEP activities.   Going through the vaginal canal  with manual work on the introitus, perineal body and along the levator ani using trigger point response holding for 90 seconds. Slowly placing one finger into the canal then placing two fingers monitoring for pain.  Exercises: Stretches/mobility:hamstring stretch with strap 30 sec each side Hip adductors with strap holding for 30 sec each side ITB with strap holding for 30 sec each side.  Self-care: Educated patient on using vaginal dilators, how to progress, using water lubricant.     06/28/2021 .Manual: Soft tissue mobilization:to bilateral hip adductors  with tissue rolling; manual work to the urogenital diaphragm to release the tissue Internal pelvic floor techniques:No emotional/communication barriers or cognitive limitation. Patient is motivated to learn. Patient understands and agrees with treatment goals and plan. PT explains patient will be examined in standing, sitting, and lying down to see how their muscles and joints work. When they are ready, they will be asked to remove their underwear so PT can examine their perineum. The patient is also given the option of providing their own chaperone as one is not provided in our facility. The patient also has the right and is explained the right to defer or refuse any part of the  evaluation or treatment including the internal exam. With the patient's consent, PT will use one gloved finger to gently assess the muscles of the pelvic floor, seeing how well it contracts and relaxes and if there is muscle symmetry. After, the patient will get dressed and PT and patient will discuss exam findings and plan of care. PT and patient discuss plan of care, schedule, attendance policy and HEP activities.             Manual work around the labia majora and minora monitoring for pain then educated patient on how to perform on herself to work on elongating the tissue and to be used to the therapist working on the area. Also had a mirror for the patient to be educated on the vulva, clitoral and perineal area Exercises: Stretches/mobility:cat cow 10x                                     Pigeon pose hold 30 sec bil.                                      Quadruped with therapist putting pressure on the anococcygeal ligament and moving pelvis in different directions                                     Feet wide and bend at hips hold 30  sec then reach up with hands to rotated spine    06/21/2021 Manual: Soft tissue mobilization:manual work to bilateral hip adductors in sidely, manual work along the pubic rami and and around the labia Exercises: Stretches/mobility: standing with legs in a v and  flex at trunk with hands on mat holding 30 sec                                     V stance then lit arm in  air 3 times each arm                                      Sit on yoga block with legs apart holding for 30 sec                                     Quadruped with hips IR and rock back and forth.                                     Pigeon pose with moving trunk to leg forward side.  Strengthening: Nustep 5 min, level 5 while assessing patient.  Self-care: Educated patient on vaginal dilators and gave her choices of different ones.  Educated patient on how to perform manual work to the labia minora  and majora.          PATIENT EDUCATION: 07/07/2021 Education details: education on using vaginal dilators and how to progress using water based lubricant Person educated: Patient Education method: Explanation, Demonstration, Tactile cues, and Verbal cues Education comprehension: verbalized understanding, returned demonstration, verbal cues required, tactile cues required, and needs further education       HOME EXERCISE PROGRAM: 06/14/2021 Access Code: [email or web address] URL: [email or web address] Date: 06/14/2021 Prepared by: Earlie Counts   Exercises - Happy Baby with Pelvic Floor Lengthening  - 1 x daily - 7 x weekly - 1 sets - 1 reps - 30 sec hold - Supine Piriformis Stretch with Leg Straight  - 1 x daily - 7 x weekly - 1 sets - 2 reps - 30 sec hold - Supine Hamstring Stretch  - 1 x daily - 7 x weekly - 1 sets - 2 reps - 30 sec hold - Cat Cow  - 1 x daily - 7 x weekly - 1 sets - 10 reps - Child's Pose Stretch  - 1 x daily - 7 x weekly - 3 sets - 10 reps - Child's Pose with Sidebending  - 1 x daily - 7 x weekly - 1 sets - 2 reps - 30 sec hold - Sidelying Reverse Clamshell  - 1 x daily - 7 x weekly - 2 sets - 5 reps - Supine Diaphragmatic Breathing  - 1 x daily - 7 x weekly - 1 sets - 10 reps     ASSESSMENT:   CLINICAL IMPRESSION: Patient is a 41  y.o. female  who was seen today for physical therapy  treatment for vaginal dryness and painful intercourse. Patient has ordered the vaginal dilators. She was educated on using the dilators. Patient was able to tolerate the manual work internal without pain just pressure. Patient was able to feel the muscles relax with the manual work. She is able to do the diaphragmatic breathing and relax the pelvic floor. Patient will benefit from skilled therapy to improve pelvic floor elongation so she is able to have vaginal penetration vaginally.      OBJECTIVE IMPAIRMENTS decreased activity tolerance, decreased coordination, decreased strength,  increased fascial restrictions, increased muscle spasms, impaired tone, and pain.    ACTIVITY LIMITATIONS  vaginal penetration    PARTICIPATION LIMITATIONS:  interpersonal relationship   PERSONAL FACTORS 3+ comorbidities: right breast cancer 01/23/2020; hysterectomy, c-section x2  are also affecting patient's functional outcome.  REHAB POTENTIAL: Excellent   CLINICAL DECISION MAKING: Stable/uncomplicated   EVALUATION COMPLEXITY: Low     GOALS: Goals reviewed with patient? Yes   SHORT TERM GOALS: Target date: 06/28/2021   Patient educated on vaginal moisturizers and lubricants to improve vaginal health.  Baseline: Goal status: met 06/14/2021   2.  Patient educated on perineal massage to assist with vaginal elongation.  Baseline:  Goal status: met 06/14/2021   3.  Patient educated on diaphragmatic breathing to lengthen the pelvic floor.  Baseline:  Goal status: Met 07/07/2021     LONG TERM GOALS: Target date: 08/23/2021    Patient independent with advanced HEP for pelvic floor lengthening.  Baseline:  Goal status: INITIAL   2.  Patient is able to have penile penetration vaginally with 0-1/10 pain level and Marinoff score is 1/3.  Baseline:  Goal status: INITIAL   3.  Pelvic floor strength is 3/5 with full relaxation after contraction and ability to hug the therapist finger due to improved lengthening of the muscle.  Baseline:  Goal status: INITIAL   4.  Vaginal dryness improved >= 50% due to using vaginal moisturizers to improve tissue health.  Baseline:  Goal status: met 06/28/2021   PLAN: PT FREQUENCY: 1x/week   PT DURATION: 12 weeks   PLANNED INTERVENTIONS: Therapeutic exercises, Therapeutic activity, Neuromuscular re-education, Patient/Family education, Joint mobilization, Dry Needling, Electrical stimulation, and Manual therapy   PLAN FOR NEXT SESSION: manual work to release outside the vaginal area then work internally, check hip strength,    Earlie Counts,  PT 07/07/21 11:48 AM

## 2021-07-12 ENCOUNTER — Ambulatory Visit: Payer: 59 | Admitting: Physical Therapy

## 2021-07-12 ENCOUNTER — Encounter: Payer: Self-pay | Admitting: Physical Therapy

## 2021-07-12 ENCOUNTER — Ambulatory Visit: Payer: 59 | Attending: Hematology and Oncology

## 2021-07-12 VITALS — Wt 120.5 lb

## 2021-07-12 DIAGNOSIS — R252 Cramp and spasm: Secondary | ICD-10-CM

## 2021-07-12 DIAGNOSIS — Z483 Aftercare following surgery for neoplasm: Secondary | ICD-10-CM | POA: Diagnosis not present

## 2021-07-12 NOTE — Therapy (Signed)
OUTPATIENT PHYSICAL THERAPY TREATMENT NOTE   Patient Name: Sydney Herman MRN: [id number] DOB:1980/10/01, 41 y.o., female Today's Date: 07/12/2021  PCP: Luetta Nutting, MD REFERRING PROVIDER: Nicholas Lose, MD  END OF SESSION:   PT End of Session - 07/12/21 1016     Visit Number 6    Date for PT Re-Evaluation 08/23/21    Authorization Type [facility]    Authorization - Visit Number 6    Authorization - Number of Visits 24    PT Start Time 7510    PT Stop Time 1055    PT Time Calculation (min) 40 min    Activity Tolerance Patient tolerated treatment well    Behavior During Therapy [facility] for tasks assessed/performed             Past Medical History:  Diagnosis Date   Abdominal wall mass of right lower quadrant 05/17/2013   Saw Dr. Georgette Dover 2015- was thought to be scar tissue- no growth since that time    Breast cancer ([place]) 01/23/2020   Right   Chicken pox    Family history of esophageal cancer    Family history of prostate cancer    Family history of uterine cancer    Generalized headaches    Has had some migraines in past as well. once a week or less. Usually occipital and associated with sinus pressure as well.    Heartburn in pregnancy    Past Surgical History:  Procedure Laterality Date   BREAST RECONSTRUCTION WITH PLACEMENT OF TISSUE EXPANDER AND ALLODERM Bilateral 09/09/2020   Procedure: BILATERAL BREAST RECONSTRUCTION WITH PLACEMENT OF TISSUE EXPANDER AND ALLODERM;  Surgeon: Irene Limbo, MD;  Location: [place];  Service: Plastics;  Laterality: Bilateral;   CESAREAN SECTION  07/11/2010   [facility]   CESAREAN SECTION N/A 12/27/2012   womens 2nd    NIPPLE SPARING MASTECTOMY Left 09/09/2020   Procedure: LEFT NIPPLE SPARING MASTECTOMY;  Surgeon: Rolm Bookbinder, MD;  Location: [place];  Service: General;  Laterality: Left;   NIPPLE SPARING MASTECTOMY WITH SENTINEL LYMPH NODE BIOPSY Right 09/09/2020   Procedure: RIGHT NIPPLE  SPARING MASTECTOMY WITH RIGHT AXILLARY SENTINEL LYMPH NODE BIOPSY;  Surgeon: Rolm Bookbinder, MD;  Location: [place];  Service: General;  Laterality: Right;   PORTACATH PLACEMENT Right 02/04/2020   Procedure: INSERTION PORT-A-CATH WITH ULTRASOUND;  Surgeon: Rolm Bookbinder, MD;  Location: [place];  Service: General;  Laterality: Right;   REMOVAL OF BILATERAL TISSUE EXPANDERS WITH PLACEMENT OF BILATERAL BREAST IMPLANTS Bilateral 12/15/2020   Procedure: REMOVAL OF BILATERAL TISSUE EXPANDERS WITH PLACEMENT OF BILATERAL BREAST SILICONE IMPLANTS;  Surgeon: Irene Limbo, MD;  Location: [place];  Service: Plastics;  Laterality: Bilateral;   TOTAL ABDOMINAL HYSTERECTOMY N/A    WISDOM TOOTH EXTRACTION     Patient Active Problem List   Diagnosis Date Noted   Well adult exam 04/27/2021   Adjustment reaction with anxiety 04/27/2021   Malignant tumor of breast ([place]) 05/12/2020   Port-A-Cath in place 03/19/2020   BRCA2 gene mutation positive 02/21/2020   Family history of prostate cancer    Family history of uterine cancer    Family history of esophageal cancer    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   History of abnormal cervical Pap smear 11/16/2018   Family history of type 2 diabetes mellitus in father 11/16/2018   Family history of breast cancer in mother 11/16/2018   Mild  hyperlipidemia 11/02/2017   Vitamin B12 deficiency 02/20/2017   LGSIL (low grade squamous intraepithelial lesion) on Pap smear 10/02/2011  REFERRING DIAG: N89.8 Vaginal Dryness   THERAPY DIAG:  Aftercare following surgery for neoplasm   Cramp and spasm   Rationale for Evaluation and Treatment Rehabilitation   PERTINENT HISTORY: Right breast cancer; C-section x2; hysterectomy   PRECAUTIONS: cancer   SUBJECTIVE: NOt much has changed and have not used the dilators due to being out of town on a girls trip.  PAIN:  Are  you having pain? Yes NPRS scale: 7/10 Pain location: Internal   Pain type: tight Pain description:  tearing     Aggravating factors: penile penetration Relieving factors: no penile penetration vaginally   PATIENT GOALS reduce pain and improve dryness   INTERCOURSE Pain with intercourse: Initial Penetration, after intercourse and during Ability to have vaginal penetration:  No Climax: able to climax Marinoff Scale: 3/3   OBJECTIVE: (objective measures completed at initial evaluation unless otherwise dated)   COGNITION:            Overall cognitive status: Within functional limits for tasks assessed                          SENSATION:            Light touch: Appears intact            Proprioception: Appears intact      POSTURE:  good   LUMBARAROM/PROM lumbar ROM is full.      LOWER EXTREMITY ROM: Bilateral hip ROM is full.    LOWER EXTREMITY MMT:   MMT Right eval Left eval Right 07/12/2021 Left  07/12/2021  Hip abduction 4/5 4/5 4/5 4/5  Hip adduction 4/5 4/5 5/5 5/5    PELVIC MMT:   MMT eval  Vaginal 2/5 with less contraction on the sides  (Blank rows = not tested)         PALPATION:   General  Good mobility of the abdominal scars for the hysterectomy. Restrictions of the c-section scar                 External Perineal Exam vaginal dryness                             Internal Pelvic Floor tenderness located on the obturator internist, iliococcygeus; firmness   TONE: increased   PROLAPSE: none   TODAY'S TREATMENT  07/12/2021 Manual: Soft tissue mobilization:to bilateral hip adductors with releasing the tissue. Manual work to the perineal body to elongate the tissue.  Internal pelvic floor techniques:No emotional/communication barriers or cognitive limitation. Patient is motivated to learn. Patient understands and agrees with treatment goals and plan. PT explains patient will be examined in standing, sitting, and lying down to see how their muscles and  joints work. When they are ready, they will be asked to remove their underwear so PT can examine their perineum. The patient is also given the option of providing their own chaperone as one is not provided in our facility. The patient also has the right and is explained the right to defer or refuse any part of the evaluation or treatment including the internal exam. With the patient's consent, PT will use one gloved finger to gently assess the muscles of the pelvic floor, seeing how well it contracts and relaxes and if there is muscle symmetry. After, the patient will  get dressed and PT and patient will discuss exam findings and plan of care. PT and patient discuss plan of care, schedule, attendance policy and HEP activities.  Going through the vaginal canal working on the introitus and perineal body using the desert harvest reveleum    Exercises: Stretches/mobility:Happy baby stretch with diaphragmatic breathing holding for 1 inute Piriformis stretch holding 30 seconds each leg ITB stretch holding 30 seconds each leg    07/07/2021 Manual: Soft tissue mobilization: to bilateral hip adductors and along the pubic rami.  Internal pelvic floor techniques:No emotional/communication barriers or cognitive limitation. Patient is motivated to learn. Patient understands and agrees with treatment goals and plan. PT explains patient will be examined in standing, sitting, and lying down to see how their muscles and joints work. When they are ready, they will be asked to remove their underwear so PT can examine their perineum. The patient is also given the option of providing their own chaperone as one is not provided in our facility. The patient also has the right and is explained the right to defer or refuse any part of the evaluation or treatment including the internal exam. With the patient's consent, PT will use one gloved finger to gently assess the muscles of the pelvic floor, seeing how well it contracts and  relaxes and if there is muscle symmetry. After, the patient will get dressed and PT and patient will discuss exam findings and plan of care. PT and patient discuss plan of care, schedule, attendance policy and HEP activities.             Going through the vaginal canal  with manual work on the introitus, perineal body and along the levator ani using trigger point response holding for 90 seconds. Slowly placing one finger into the canal then placing two fingers monitoring for pain.  Exercises: Stretches/mobility:hamstring stretch with strap 30 sec each side Hip adductors with strap holding for 30 sec each side ITB with strap holding for 30 sec each side.  Self-care: Educated patient on using vaginal dilators, how to progress, using water lubricant.     06/28/2021 .Manual: Soft tissue mobilization:to bilateral hip adductors  with tissue rolling; manual work to the urogenital diaphragm to release the tissue Internal pelvic floor techniques:No emotional/communication barriers or cognitive limitation. Patient is motivated to learn. Patient understands and agrees with treatment goals and plan. PT explains patient will be examined in standing, sitting, and lying down to see how their muscles and joints work. When they are ready, they will be asked to remove their underwear so PT can examine their perineum. The patient is also given the option of providing their own chaperone as one is not provided in our facility. The patient also has the right and is explained the right to defer or refuse any part of the evaluation or treatment including the internal exam. With the patient's consent, PT will use one gloved finger to gently assess the muscles of the pelvic floor, seeing how well it contracts and relaxes and if there is muscle symmetry. After, the patient will get dressed and PT and patient will discuss exam findings and plan of care. PT and patient discuss plan of care, schedule, attendance policy and HEP  activities.             Manual work around the labia majora and minora monitoring for pain then educated patient on how to perform on herself to work on elongating the tissue and to be used to the therapist  working on the area. Also had a mirror for the patient to be educated on the vulva, clitoral and perineal area Exercises: Stretches/mobility:cat cow 10x                                     Pigeon pose hold 30 sec bil.                                      Quadruped with therapist putting pressure on the anococcygeal ligament and moving pelvis in different directions                                     Feet wide and bend at hips hold 30  sec then reach up with hands to rotated spine        PATIENT EDUCATION: 07/07/2021 Education details: education on using vaginal dilators and how to progress using water based lubricant Person educated: Patient Education method: Explanation, Demonstration, Tactile cues, and Verbal cues Education comprehension: verbalized understanding, returned demonstration, verbal cues required, tactile cues required, and needs further education       HOME EXERCISE PROGRAM: 06/14/2021 Access Code: [email or web address] URL: [email or web address] Date: 06/14/2021 Prepared by: Earlie Counts   Exercises - Happy Baby with Pelvic Floor Lengthening  - 1 x daily - 7 x weekly - 1 sets - 1 reps - 30 sec hold - Supine Piriformis Stretch with Leg Straight  - 1 x daily - 7 x weekly - 1 sets - 2 reps - 30 sec hold - Supine Hamstring Stretch  - 1 x daily - 7 x weekly - 1 sets - 2 reps - 30 sec hold - Cat Cow  - 1 x daily - 7 x weekly - 1 sets - 10 reps - Child's Pose Stretch  - 1 x daily - 7 x weekly - 3 sets - 10 reps - Child's Pose with Sidebending  - 1 x daily - 7 x weekly - 1 sets - 2 reps - 30 sec hold - Sidelying Reverse Clamshell  - 1 x daily - 7 x weekly - 2 sets - 5 reps - Supine Diaphragmatic Breathing  - 1 x daily - 7 x weekly - 1 sets - 10 reps     ASSESSMENT:    CLINICAL IMPRESSION: Patient is a 41  y.o. female  who was seen today for physical therapy  treatment for vaginal dryness and painful intercourse. Patient has increased strength to 5/5 for hip adductors.  Patient did not have pain with the manual work to the perineal body and introitus. Her tissue became soft and elongated well. Patient continues to have tightness in the hip adductors. She will be able to work with the dilators today. Patient will benefit from skilled therapy to improve pelvic floor elongation so she is able to have vaginal penetration vaginally.      OBJECTIVE IMPAIRMENTS decreased activity tolerance, decreased coordination, decreased strength, increased fascial restrictions, increased muscle spasms, impaired tone, and pain.    ACTIVITY LIMITATIONS  vaginal penetration    PARTICIPATION LIMITATIONS:  interpersonal relationship   PERSONAL FACTORS 3+ comorbidities: right breast cancer 01/23/2020; hysterectomy, c-section x2  are also affecting patient's functional outcome.    REHAB POTENTIAL: Excellent   CLINICAL DECISION MAKING: Stable/uncomplicated  EVALUATION COMPLEXITY: Low     GOALS: Goals reviewed with patient? Yes   SHORT TERM GOALS: Target date: 06/28/2021   Patient educated on vaginal moisturizers and lubricants to improve vaginal health.  Baseline: Goal status: met 06/14/2021   2.  Patient educated on perineal massage to assist with vaginal elongation.  Baseline:  Goal status: met 06/14/2021   3.  Patient educated on diaphragmatic breathing to lengthen the pelvic floor.  Baseline:  Goal status: Met 07/07/2021     LONG TERM GOALS: Target date: 08/23/2021    Patient independent with advanced HEP for pelvic floor lengthening.  Baseline:  Goal status: INITIAL   2.  Patient is able to have penile penetration vaginally with 0-1/10 pain level and Marinoff score is 1/3.  Baseline:  Goal status: INITIAL   3.  Pelvic floor strength is 3/5 with full relaxation  after contraction and ability to hug the therapist finger due to improved lengthening of the muscle.  Baseline:  Goal status: INITIAL   4.  Vaginal dryness improved >= 50% due to using vaginal moisturizers to improve tissue health.  Baseline:  Goal status: met 06/28/2021   PLAN: PT FREQUENCY: 1x/week   PT DURATION: 12 weeks   PLANNED INTERVENTIONS: Therapeutic exercises, Therapeutic activity, Neuromuscular re-education, Patient/Family education, Joint mobilization, Dry Needling, Electrical stimulation, and Manual therapy   PLAN FOR NEXT SESSION: manual work to release outside the vaginal area then work internally, see how it is going with the dilators    Earlie Counts, PT 07/12/21 10:59 AM

## 2021-07-12 NOTE — Therapy (Signed)
OUTPATIENT PHYSICAL THERAPY SOZO SCREENING NOTE   Patient Name: Sydney Herman MRN: [id number] DOB:1980/06/08, 41 y.o., female 78 Date: 07/12/2021  PCP: Luetta Nutting, DO REFERRING PROVIDER: Nicholas Lose, MD   PT End of Session - 07/12/21 [phone]     Visit Number 5   # unchanged due to screen only   PT Start Time 0945    PT Stop Time 0950    PT Time Calculation (min) 5 min    Activity Tolerance Patient tolerated treatment well    Behavior During Therapy [facility] for tasks assessed/performed             Past Medical History:  Diagnosis Date   Abdominal wall mass of right lower quadrant 05/17/2013   Saw Dr. Georgette Dover 2015- was thought to be scar tissue- no growth since that time    Breast cancer [facility]) 01/23/2020   Right   Chicken pox    Family history of esophageal cancer    Family history of prostate cancer    Family history of uterine cancer    Generalized headaches    Has had some migraines in past as well. once a week or less. Usually occipital and associated with sinus pressure as well.    Heartburn in pregnancy    Past Surgical History:  Procedure Laterality Date   BREAST RECONSTRUCTION WITH PLACEMENT OF TISSUE EXPANDER AND ALLODERM Bilateral 09/09/2020   Procedure: BILATERAL BREAST RECONSTRUCTION WITH PLACEMENT OF TISSUE EXPANDER AND ALLODERM;  Surgeon: Irene Limbo, MD;  Location: [place];  Service: Plastics;  Laterality: Bilateral;   CESAREAN SECTION  07/11/2010   [facility]   CESAREAN SECTION N/A 12/27/2012   womens 2nd    NIPPLE SPARING MASTECTOMY Left 09/09/2020   Procedure: LEFT NIPPLE SPARING MASTECTOMY;  Surgeon: Rolm Bookbinder, MD;  Location: [place];  Service: General;  Laterality: Left;   NIPPLE SPARING MASTECTOMY WITH SENTINEL LYMPH NODE BIOPSY Right 09/09/2020   Procedure: RIGHT NIPPLE SPARING MASTECTOMY WITH RIGHT AXILLARY SENTINEL LYMPH NODE BIOPSY;  Surgeon: Rolm Bookbinder, MD;  Location: [place];  Service: General;  Laterality: Right;   PORTACATH PLACEMENT Right 02/04/2020   Procedure: INSERTION PORT-A-CATH WITH ULTRASOUND;  Surgeon: Rolm Bookbinder, MD;  Location: [place];  Service: General;  Laterality: Right;   REMOVAL OF BILATERAL TISSUE EXPANDERS WITH PLACEMENT OF BILATERAL BREAST IMPLANTS Bilateral 12/15/2020   Procedure: REMOVAL OF BILATERAL TISSUE EXPANDERS WITH PLACEMENT OF BILATERAL BREAST SILICONE IMPLANTS;  Surgeon: Irene Limbo, MD;  Location: [place];  Service: Plastics;  Laterality: Bilateral;   TOTAL ABDOMINAL HYSTERECTOMY N/A    WISDOM TOOTH EXTRACTION     Patient Active Problem List   Diagnosis Date Noted   Well adult exam 04/27/2021   Adjustment reaction with anxiety 04/27/2021   Malignant tumor of breast ([place]) 05/12/2020   Port-A-Cath in place 03/19/2020   BRCA2 gene mutation positive 02/21/2020   Family history of prostate cancer    Family history of uterine cancer    Family history of esophageal cancer    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 She underwent neoadjuvant chemotherapy 02/05/2020 - 08/18/2020 followed by a bilateral mastectomy and right sentinel node biopsy (3 negative nodes) on 09/09/2020. The mass measures 2 cm and is located in the upper outer quadrant with a Ki67 of 10%.   PRECAUTIONS:  right UE Lymphedema risk, None  SUBJECTIVE: Pt returns for her 3 month L-Dex screen.   PAIN:  Are you having pain? No  SOZO SCREENING: Patient was assessed today using the SOZO machine to determine the lymphedema index score. This was compared to her baseline score. It was determined that she is within the recommended range when compared to her baseline and no further action is needed at this time. She will continue SOZO screenings. These are done every 3 months for 2 years post operatively followed by every 6 months for 2 years, and then annually.    Otelia Limes, PTA 07/12/2021, 9:49 AM

## 2021-07-23 ENCOUNTER — Ambulatory Visit: Payer: 59 | Admitting: Physical Therapy

## 2021-07-26 ENCOUNTER — Ambulatory Visit: Payer: 59 | Attending: Hematology and Oncology | Admitting: Physical Therapy

## 2021-07-26 ENCOUNTER — Encounter: Payer: Self-pay | Admitting: Physical Therapy

## 2021-07-26 ENCOUNTER — Encounter: Payer: Self-pay | Admitting: Hematology and Oncology

## 2021-07-26 DIAGNOSIS — Z483 Aftercare following surgery for neoplasm: Secondary | ICD-10-CM | POA: Insufficient documentation

## 2021-07-26 DIAGNOSIS — R252 Cramp and spasm: Secondary | ICD-10-CM | POA: Insufficient documentation

## 2021-07-26 NOTE — Therapy (Signed)
OUTPATIENT PHYSICAL THERAPY TREATMENT NOTE   Patient Name: Sydney Herman MRN: [id number] DOB:08-21-1980, 41 y.o., female Today's Date: 07/26/2021  PCP: Luetta Nutting, MD REFERRING PROVIDER: Nicholas Lose, MD  END OF SESSION:   PT End of Session - 07/26/21 1147     Visit Number 7    Date for PT Re-Evaluation 08/23/21    Authorization Type [facility] [employer]    Authorization - Visit Number 7    Authorization - Number of Visits 24    PT Start Time 6712    PT Stop Time 1225    PT Time Calculation (min) 40 min    Activity Tolerance Patient tolerated treatment well    Behavior During Therapy [facility] for tasks assessed/performed             Past Medical History:  Diagnosis Date   Abdominal wall mass of right lower quadrant 05/17/2013   Saw Dr. Georgette Dover 2015- was thought to be scar tissue- no growth since that time    Breast cancer ([place]) 01/23/2020   Right   Chicken pox    Family history of esophageal cancer    Family history of prostate cancer    Family history of uterine cancer    Generalized headaches    Has had some migraines in past as well. once a week or less. Usually occipital and associated with sinus pressure as well.    Heartburn in pregnancy    Past Surgical History:  Procedure Laterality Date   BREAST RECONSTRUCTION WITH PLACEMENT OF TISSUE EXPANDER AND ALLODERM Bilateral 09/09/2020   Procedure: BILATERAL BREAST RECONSTRUCTION WITH PLACEMENT OF TISSUE EXPANDER AND ALLODERM;  Surgeon: Irene Limbo, MD;  Location: [place];  Service: Plastics;  Laterality: Bilateral;   CESAREAN SECTION  07/11/2010   [facility]   CESAREAN SECTION N/A 12/27/2012   [facility]om[facility] 2nd    NIPPLE SPARING MASTECTOMY Left 09/09/2020   Procedure: LEFT NIPPLE SPARING MASTECTOMY;  Surgeon: Rolm Bookbinder, MD;  Location: [place];  Service: General;  Laterality: Left;   NIPPLE SPARING MASTECTOMY WITH SENTINEL LYMPH NODE BIOPSY Right 09/09/2020   Procedure: RIGHT  NIPPLE SPARING MASTECTOMY WITH RIGHT AXILLARY SENTINEL LYMPH NODE BIOPSY;  Surgeon: Rolm Bookbinder, MD;  Location: [place];  Service: General;  Laterality: Right;   PORTACATH PLACEMENT Right 02/04/2020   Procedure: INSERTION PORT-A-CATH WITH ULTRASOUND;  Surgeon: Rolm Bookbinder, MD;  Location: [place];  Service: General;  Laterality: Right;   REMOVAL OF BILATERAL TISSUE EXPANDERS WITH PLACEMENT OF BILATERAL BREAST IMPLANTS Bilateral 12/15/2020   Procedure: REMOVAL OF BILATERAL TISSUE EXPANDERS WITH PLACEMENT OF BILATERAL BREAST SILICONE IMPLANTS;  Surgeon: Irene Limbo, MD;  Location: [place];  Service: Plastics;  Laterality: Bilateral;   TOTAL ABDOMINAL HYSTERECTOMY N/A    WISDOM TOOTH EXTRACTION     Patient Active Problem List   Diagnosis Date Noted   Well adult exam 04/27/2021   Adjustment reaction with anxiety 04/27/2021   Malignant tumor of breast ([place]) 05/12/2020   Port-A-Cath in place 03/19/2020   BRCA2 gene mutation positive 02/21/2020   Family history of prostate cancer    Family history of uterine cancer    Family history of esophageal cancer    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   History of abnormal cervical Pap smear 11/16/2018   Family history of type 2 diabetes mellitus in father 11/16/2018   Family history of breast cancer in mother 11/16/2018   Mild  hyperlipidemia 11/02/2017   Vitamin B12 deficiency 02/20/2017   LGSIL (low grade squamous intraepithelial lesion) on Pap smear 10/02/2011   REFERRING DIAG: N89.8 Vaginal Dryness   THERAPY DIAG:  Aftercare following surgery for neoplasm   Cramp and spasm   Rationale for Evaluation and Treatment Rehabilitation   PERTINENT HISTORY: Right breast cancer; C-section x2; hysterectomy   PRECAUTIONS: cancer   SUBJECTIVE: I started to use the dilators. I  am on the third size. Small one was easy. The vaginal dryness is  feeling better.   PAIN:  Are you having pain? Yes NPRS scale: 7/10 Pain location: Internal   Pain type: tight Pain description:  tearing     Aggravating factors: penile penetration Relieving factors: no penile penetration vaginally   PATIENT GOALS reduce pain and improve dryness   INTERCOURSE Pain with intercourse: Initial Penetration, after intercourse and during Ability to have vaginal penetration:  No Climax: able to climax Marinoff Scale: 3/3   OBJECTIVE: (objective measures completed at initial evaluation unless otherwise dated)   COGNITION:            Overall cognitive status: Within functional limits for tasks assessed                          SENSATION:            Light touch: Appears intact            Proprioception: Appears intact      POSTURE:  good   LUMBARAROM/PROM lumbar ROM is full.      LOWER EXTREMITY ROM: Bilateral hip ROM is full.    LOWER EXTREMITY MMT:   MMT Right eval Left eval Right 07/12/2021 Left  07/12/2021  Hip abduction 4/5 4/5 4/5 4/5  Hip adduction 4/5 4/5 5/5 5/5    PELVIC MMT:   MMT eval  Vaginal 2/5 with less contraction on the sides  (Blank rows = not tested)         PALPATION:   General  Good mobility of the abdominal scars for the hysterectomy. Restrictions of the c-section scar                 External Perineal Exam vaginal dryness                             Internal Pelvic Floor tenderness located on the obturator internist, iliococcygeus; firmness   TONE: increased   PROLAPSE: none   TODAY'S TREATMENT  07/26/2021 Manual: Soft tissue mobilization:to bilateral hip adductors Myofascial release:release of the urogenital diaphragm Internal pelvic floor techniques:No emotional/communication barriers or cognitive limitation. Patient is motivated to learn. Patient understands and agrees with treatment goals and plan. PT explains patient will be examined in standing, sitting, and lying down to see how their muscles and  joints work. When they are ready, they will be asked to remove their underwear so PT can examine their perineum. The patient is also given the option of providing their own chaperone as one is not provided in our facility. The patient also has the right and is explained the right to defer or refuse any part of the evaluation or treatment including the internal exam. With the patient's consent, PT will use one gloved finger to gently assess the muscles of the pelvic floor, seeing how well it contracts and relaxes and if there is muscle symmetry. After, the patient will get dressed and PT  and patient will discuss exam findings and plan of care. PT and patient discuss plan of care, schedule, attendance policy and HEP activities. Going through vaginally working on the introitus to elongate the tissue for penile penetration Exercises: Stretches/mobility:using a ball to massage the bottoms of her feet to relax the pelvic floor and help with her foot pain.     07/12/2021 Manual: Soft tissue mobilization:to bilateral hip adductors with releasing the tissue. Manual work to the perineal body to elongate the tissue.  Internal pelvic floor techniques:No emotional/communication barriers or cognitive limitation. Patient is motivated to learn. Patient understands and agrees with treatment goals and plan. PT explains patient will be examined in standing, sitting, and lying down to see how their muscles and joints work. When they are ready, they will be asked to remove their underwear so PT can examine their perineum. The patient is also given the option of providing their own chaperone as one is not provided in our facility. The patient also has the right and is explained the right to defer or refuse any part of the evaluation or treatment including the internal exam. With the patient's consent, PT will use one gloved finger to gently assess the muscles of the pelvic floor, seeing how well it contracts and relaxes and if there  is muscle symmetry. After, the patient will get dressed and PT and patient will discuss exam findings and plan of care. PT and patient discuss plan of care, schedule, attendance policy and HEP activities.  Going through the vaginal canal working on the introitus and perineal body using the desert harvest reveleum             Exercises: Stretches/mobility:Happy baby stretch with diaphragmatic breathing holding for 1 inute Piriformis stretch holding 30 seconds each leg ITB stretch holding 30 seconds each leg    07/07/2021 Manual: Soft tissue mobilization: to bilateral hip adductors and along the pubic rami.  Internal pelvic floor techniques:No emotional/communication barriers or cognitive limitation. Patient is motivated to learn. Patient understands and agrees with treatment goals and plan. PT explains patient will be examined in standing, sitting, and lying down to see how their muscles and joints work. When they are ready, they will be asked to remove their underwear so PT can examine their perineum. The patient is also given the option of providing their own chaperone as one is not provided in our facility. The patient also has the right and is explained the right to defer or refuse any part of the evaluation or treatment including the internal exam. With the patient's consent, PT will use one gloved finger to gently assess the muscles of the pelvic floor, seeing how well it contracts and relaxes and if there is muscle symmetry. After, the patient will get dressed and PT and patient will discuss exam findings and plan of care. PT and patient discuss plan of care, schedule, attendance policy and HEP activities.             Going through the vaginal canal  with manual work on the introitus, perineal body and along the levator ani using trigger point response holding for 90 seconds. Slowly placing one finger into the canal then placing two fingers monitoring for pain.   Exercises: Stretches/mobility:hamstring stretch with strap 30 sec each side Hip adductors with strap holding for 30 sec each side ITB with strap holding for 30 sec each side.  Self-care: Educated patient on using vaginal dilators, how to progress, using water lubricant.  PATIENT EDUCATION: 07/07/2021 Education details: education on using vaginal dilators and how to progress using water based lubricant Person educated: Patient Education method: Explanation, Demonstration, Tactile cues, and Verbal cues Education comprehension: verbalized understanding, returned demonstration, verbal cues required, tactile cues required, and needs further education       HOME EXERCISE PROGRAM: 06/14/2021 Access Code: [email or web address] URL: [email or web address] Date: 06/14/2021 Prepared by: Earlie Counts   Exercises - Happy Baby with Pelvic Floor Lengthening  - 1 x daily - 7 x weekly - 1 sets - 1 reps - 30 sec hold - Supine Piriformis Stretch with Leg Straight  - 1 x daily - 7 x weekly - 1 sets - 2 reps - 30 sec hold - Supine Hamstring Stretch  - 1 x daily - 7 x weekly - 1 sets - 2 reps - 30 sec hold - Cat Cow  - 1 x daily - 7 x weekly - 1 sets - 10 reps - Child's Pose Stretch  - 1 x daily - 7 x weekly - 3 sets - 10 reps - Child's Pose with Sidebending  - 1 x daily - 7 x weekly - 1 sets - 2 reps - 30 sec hold - Sidelying Reverse Clamshell  - 1 x daily - 7 x weekly - 2 sets - 5 reps - Supine Diaphragmatic Breathing  - 1 x daily - 7 x weekly - 1 sets - 10 reps     ASSESSMENT:   CLINICAL IMPRESSION: Patient is a 41  y.o. female  who was seen today for physical therapy  treatment for vaginal dryness and painful intercourse. Patient is on the third dilator.  Patient did not have pain with the manual work to the introitus and the tissue elongated well. Patient will benefit from skilled therapy to improve pelvic floor elongation so she is able to have vaginal penetration vaginally.       OBJECTIVE IMPAIRMENTS decreased activity tolerance, decreased coordination, decreased strength, increased fascial restrictions, increased muscle spasms, impaired tone, and pain.    ACTIVITY LIMITATIONS  vaginal penetration    PARTICIPATION LIMITATIONS:  interpersonal relationship   PERSONAL FACTORS 3+ comorbidities: right breast cancer 01/23/2020; hysterectomy, c-section x2  are also affecting patient's functional outcome.    REHAB POTENTIAL: Excellent   CLINICAL DECISION MAKING: Stable/uncomplicated   EVALUATION COMPLEXITY: Low     GOALS: Goals reviewed with patient? Yes   SHORT TERM GOALS: Target date: 06/28/2021   Patient educated on vaginal moisturizers and lubricants to improve vaginal health.  Baseline: Goal status: met 06/14/2021   2.  Patient educated on perineal massage to assist with vaginal elongation.  Baseline:  Goal status: met 06/14/2021   3.  Patient educated on diaphragmatic breathing to lengthen the pelvic floor.  Baseline:  Goal status: Met 07/07/2021     LONG TERM GOALS: Target date: 08/23/2021    Patient independent with advanced HEP for pelvic floor lengthening.  Baseline:  Goal status: INITIAL   2.  Patient is able to have penile penetration vaginally with 0-1/10 pain level and Marinoff score is 1/3.  Baseline:  Goal status: INITIAL   3.  Pelvic floor strength is 3/5 with full relaxation after contraction and ability to hug the therapist finger due to improved lengthening of the muscle.  Baseline:  Goal status: INITIAL   4.  Vaginal dryness improved >= 50% due to using vaginal moisturizers to improve tissue health.  Baseline:  Goal status: met 06/28/2021   PLAN: PT FREQUENCY: 1x/week   PT  DURATION: 12 weeks   PLANNED INTERVENTIONS: Therapeutic exercises, Therapeutic activity, Neuromuscular re-education, Patient/Family education, Joint mobilization, Dry Needling, Electrical stimulation, and Manual therapy   PLAN FOR NEXT SESSION: manual work to  release outside the vaginal area then work internally see how the foot pain is doing    Earlie Counts, PT 07/26/21 12:26 PM

## 2021-07-28 ENCOUNTER — Ambulatory Visit: Payer: 59 | Admitting: Physical Therapy

## 2021-08-09 ENCOUNTER — Other Ambulatory Visit (HOSPITAL_COMMUNITY): Payer: Self-pay

## 2021-08-09 DIAGNOSIS — D225 Melanocytic nevi of trunk: Secondary | ICD-10-CM | POA: Diagnosis not present

## 2021-08-09 DIAGNOSIS — L814 Other melanin hyperpigmentation: Secondary | ICD-10-CM | POA: Diagnosis not present

## 2021-08-09 DIAGNOSIS — L821 Other seborrheic keratosis: Secondary | ICD-10-CM | POA: Diagnosis not present

## 2021-08-09 DIAGNOSIS — L579 Skin changes due to chronic exposure to nonionizing radiation, unspecified: Secondary | ICD-10-CM | POA: Diagnosis not present

## 2021-08-09 DIAGNOSIS — D2262 Melanocytic nevi of left upper limb, including shoulder: Secondary | ICD-10-CM | POA: Diagnosis not present

## 2021-08-11 ENCOUNTER — Ambulatory Visit: Payer: 59 | Attending: Hematology and Oncology | Admitting: Physical Therapy

## 2021-08-11 ENCOUNTER — Encounter: Payer: Self-pay | Admitting: Physical Therapy

## 2021-08-11 DIAGNOSIS — R252 Cramp and spasm: Secondary | ICD-10-CM | POA: Diagnosis not present

## 2021-08-11 DIAGNOSIS — Z483 Aftercare following surgery for neoplasm: Secondary | ICD-10-CM | POA: Insufficient documentation

## 2021-08-11 NOTE — Therapy (Signed)
OUTPATIENT PHYSICAL THERAPY TREATMENT NOTE   Patient Name: Sydney Herman MRN: [id number] DOB:Oct 05, 1980, 41 y.o., female Today's Date: 08/11/2021  PCP: Luetta Nutting, MD REFERRING PROVIDER: Nicholas Lose, MD  END OF SESSION:   PT End of Session - 08/11/21 1531     Visit Number 8    Date for PT Re-Evaluation 11/14/21    Authorization Type [facility] [employer]    Authorization - Visit Number 8    Authorization - Number of Visits 24    PT Start Time 9798    PT Stop Time 1610    PT Time Calculation (min) 40 min    Activity Tolerance Patient tolerated treatment well    Behavior During Therapy [facility] for tasks assessed/performed             Past Medical History:  Diagnosis Date   Abdominal wall mass of right lower quadrant 05/17/2013   Saw Dr. Georgette Dover 2015- was thought to be scar tissue- no growth since that time    Breast cancer ([place]) 01/23/2020   Right   Chicken pox    Family history of esophageal cancer    Family history of prostate cancer    Family history of uterine cancer    Generalized headaches    Has had some migraines in past as well. once a week or less. Usually occipital and associated with sinus pressure as well.    Heartburn in pregnancy    Past Surgical History:  Procedure Laterality Date   BREAST RECONSTRUCTION WITH PLACEMENT OF TISSUE EXPANDER AND ALLODERM Bilateral 09/09/2020   Procedure: BILATERAL BREAST RECONSTRUCTION WITH PLACEMENT OF TISSUE EXPANDER AND ALLODERM;  Surgeon: Irene Limbo, MD;  Location: [place];  Service: Plastics;  Laterality: Bilateral;   CESAREAN SECTION  07/11/2010   [facility]   CESAREAN SECTION N/A 12/27/2012   [facility]om[facility] 2nd    NIPPLE SPARING MASTECTOMY Left 09/09/2020   Procedure: LEFT NIPPLE SPARING MASTECTOMY;  Surgeon: Rolm Bookbinder, MD;  Location: [place];  Service: General;  Laterality: Left;   NIPPLE SPARING MASTECTOMY WITH SENTINEL LYMPH NODE BIOPSY Right 09/09/2020   Procedure: RIGHT NIPPLE  SPARING MASTECTOMY WITH RIGHT AXILLARY SENTINEL LYMPH NODE BIOPSY;  Surgeon: Rolm Bookbinder, MD;  Location: [place];  Service: General;  Laterality: Right;   PORTACATH PLACEMENT Right 02/04/2020   Procedure: INSERTION PORT-A-CATH WITH ULTRASOUND;  Surgeon: Rolm Bookbinder, MD;  Location: [place];  Service: General;  Laterality: Right;   REMOVAL OF BILATERAL TISSUE EXPANDERS WITH PLACEMENT OF BILATERAL BREAST IMPLANTS Bilateral 12/15/2020   Procedure: REMOVAL OF BILATERAL TISSUE EXPANDERS WITH PLACEMENT OF BILATERAL BREAST SILICONE IMPLANTS;  Surgeon: Irene Limbo, MD;  Location: [place];  Service: Plastics;  Laterality: Bilateral;   TOTAL ABDOMINAL HYSTERECTOMY N/A    WISDOM TOOTH EXTRACTION     Patient Active Problem List   Diagnosis Date Noted   Well adult exam 04/27/2021   Adjustment reaction with anxiety 04/27/2021   Malignant tumor of breast ([place]) 05/12/2020   Port-A-Cath in place 03/19/2020   BRCA2 gene mutation positive 02/21/2020   Family history of prostate cancer    Family history of uterine cancer    Family history of esophageal cancer    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   History of abnormal cervical Pap smear 11/16/2018   Family history of type 2 diabetes mellitus in father 11/16/2018   Family history of breast cancer in mother 11/16/2018   Mild  hyperlipidemia 11/02/2017   Vitamin B12 deficiency 02/20/2017   LGSIL (low grade squamous intraepithelial lesion) on Pap smear 10/02/2011   REFERRING DIAG: N89.8 Vaginal Dryness   THERAPY DIAG:  Aftercare following surgery for neoplasm   Cramp and spasm   Rationale for Evaluation and Treatment Rehabilitation   PERTINENT HISTORY: Right breast cancer; C-section x2; hysterectomy   PRECAUTIONS: cancer   SUBJECTIVE: I am still on the same size dilator and may be ready to go to the next one this week.    PAIN:  Are you  having pain? Yes NPRS scale: 7/10 Pain location: Internal   Pain type: tight Pain description:  tearing     Aggravating factors: penile penetration Relieving factors: no penile penetration vaginally   PATIENT GOALS reduce pain and improve dryness   INTERCOURSE Pain with intercourse: Initial Penetration, after intercourse and during Ability to have vaginal penetration:  No Climax: able to climax Marinoff Scale: 3/3   OBJECTIVE: (objective measures completed at initial evaluation unless otherwise dated)   COGNITION:            Overall cognitive status: Within functional limits for tasks assessed                          SENSATION:            Light touch: Appears intact            Proprioception: Appears intact      POSTURE:  good   LUMBARAROM/PROM lumbar ROM is full.      LOWER EXTREMITY ROM: Bilateral hip ROM is full.    LOWER EXTREMITY MMT:   MMT Right eval Left eval Right 07/12/2021 Left  07/12/2021 Right 08/11/2021 Left 08/11/2021  Hip abduction 4/5 4/5 4/5 4/5 4/5 4/5  Hip adduction 4/5 4/5 5/5 5/5 5/5 5/5    PELVIC MMT:   MMT eval 08/11/2021  Vaginal 2/5 with less contraction on the sides 2/5 with circular contratcion  (Blank rows = not tested)         PALPATION:   General  Good mobility of the abdominal scars for the hysterectomy. Restrictions of the c-section scar                 External Perineal Exam vaginal dryness                             Internal Pelvic Floor tenderness located on the obturator internist, iliococcygeus; firmness   TONE: increased   PROLAPSE: none   TODAY'S TREATMENT  08/11/2021 Manual: Myofascial release:release of the urogenital diaphragm Internal pelvic floor techniques:No emotional/communication barriers or cognitive limitation. Patient is motivated to learn. Patient understands and agrees with treatment goals and plan. PT explains patient will be examined in standing, sitting, and lying down to see how their muscles and  joints work. When they are ready, they will be asked to remove their underwear so PT can examine their perineum. The patient is also given the option of providing their own chaperone as one is not provided in our facility. The patient also has the right and is explained the right to defer or refuse any part of the evaluation or treatment including the internal exam. With the patient's consent, PT will use one gloved finger to gently assess the muscles of the pelvic floor, seeing how well it contracts and relaxes and if there is muscle symmetry. After, the patient  will get dressed and PT and patient will discuss exam findings and plan of care. PT and patient discuss plan of care, schedule, attendance policy and HEP activities. Therapist going through the vaginal canal working the tissue to elongate the introitus for the dilators with 2 fingers.  Therapist then placed her finger into the vaginal canal working on the levator ani with 1 finger.     07/26/2021 Manual: Soft tissue mobilization:to bilateral hip adductors Myofascial release:release of the urogenital diaphragm Internal pelvic floor techniques:No emotional/communication barriers or cognitive limitation. Patient is motivated to learn. Patient understands and agrees with treatment goals and plan. PT explains patient will be examined in standing, sitting, and lying down to see how their muscles and joints work. When they are ready, they will be asked to remove their underwear so PT can examine their perineum. The patient is also given the option of providing their own chaperone as one is not provided in our facility. The patient also has the right and is explained the right to defer or refuse any part of the evaluation or treatment including the internal exam. With the patient's consent, PT will use one gloved finger to gently assess the muscles of the pelvic floor, seeing how well it contracts and relaxes and if there is muscle symmetry. After, the patient  will get dressed and PT and patient will discuss exam findings and plan of care. PT and patient discuss plan of care, schedule, attendance policy and HEP activities. Going through vaginally working on the introitus to elongate the tissue for penile penetration Exercises: Stretches/mobility:using a ball to massage the bottoms of her feet to relax the pelvic floor and help with her foot pain.     07/12/2021 Manual: Soft tissue mobilization:to bilateral hip adductors with releasing the tissue. Manual work to the perineal body to elongate the tissue.  Internal pelvic floor techniques:No emotional/communication barriers or cognitive limitation. Patient is motivated to learn. Patient understands and agrees with treatment goals and plan. PT explains patient will be examined in standing, sitting, and lying down to see how their muscles and joints work. When they are ready, they will be asked to remove their underwear so PT can examine their perineum. The patient is also given the option of providing their own chaperone as one is not provided in our facility. The patient also has the right and is explained the right to defer or refuse any part of the evaluation or treatment including the internal exam. With the patient's consent, PT will use one gloved finger to gently assess the muscles of the pelvic floor, seeing how well it contracts and relaxes and if there is muscle symmetry. After, the patient will get dressed and PT and patient will discuss exam findings and plan of care. PT and patient discuss plan of care, schedule, attendance policy and HEP activities.  Going through the vaginal canal working on the introitus and perineal body using the desert harvest reveleum             Exercises: Stretches/mobility:Happy baby stretch with diaphragmatic breathing holding for 1 minute Piriformis stretch holding 30 seconds each leg ITB stretch holding 30 seconds each leg            PATIENT  EDUCATION: 07/07/2021 Education details: education on using vaginal dilators and how to progress using water based lubricant Person educated: Patient Education method: Explanation, Demonstration, Tactile cues, and Verbal cues Education comprehension: verbalized understanding, returned demonstration, verbal cues required, tactile cues required, and needs further education  HOME EXERCISE PROGRAM: 06/14/2021 Access Code: [email or web address] URL: [email or web address] Date: 06/14/2021 Prepared by: Earlie Counts   Exercises - Happy Baby with Pelvic Floor Lengthening  - 1 x daily - 7 x weekly - 1 sets - 1 reps - 30 sec hold - Supine Piriformis Stretch with Leg Straight  - 1 x daily - 7 x weekly - 1 sets - 2 reps - 30 sec hold - Supine Hamstring Stretch  - 1 x daily - 7 x weekly - 1 sets - 2 reps - 30 sec hold - Cat Cow  - 1 x daily - 7 x weekly - 1 sets - 10 reps - Child's Pose Stretch  - 1 x daily - 7 x weekly - 3 sets - 10 reps - Child's Pose with Sidebending  - 1 x daily - 7 x weekly - 1 sets - 2 reps - 30 sec hold - Sidelying Reverse Clamshell  - 1 x daily - 7 x weekly - 2 sets - 5 reps - Supine Diaphragmatic Breathing  - 1 x daily - 7 x weekly - 1 sets - 10 reps     ASSESSMENT:   CLINICAL IMPRESSION: Patient is a 41  y.o. female  who was seen today for physical therapy  treatment for vaginal dryness and painful intercourse. Patient is on the third dilator.  Patient did not have pain with the manual work to the introitus and the tissue elongated well. Patient is not able to have penile penetration with her husband at this time. Pelvic floor strength is 2/5 due to tightness. Patient will benefit from skilled therapy to improve pelvic floor elongation so she is able to have vaginal penetration vaginally.      OBJECTIVE IMPAIRMENTS decreased activity tolerance, decreased coordination, decreased strength, increased fascial restrictions, increased muscle spasms, impaired tone, and pain.     ACTIVITY LIMITATIONS  vaginal penetration    PARTICIPATION LIMITATIONS:  interpersonal relationship   PERSONAL FACTORS 3+ comorbidities: right breast cancer 01/23/2020; hysterectomy, c-section x2  are also affecting patient's functional outcome.    REHAB POTENTIAL: Excellent   CLINICAL DECISION MAKING: Stable/uncomplicated   EVALUATION COMPLEXITY: Low     GOALS: Goals reviewed with patient? Yes   SHORT TERM GOALS: Target date: 06/28/2021   Patient educated on vaginal moisturizers and lubricants to improve vaginal health.  Baseline: Goal status: met 06/14/2021   2.  Patient educated on perineal massage to assist with vaginal elongation.  Baseline:  Goal status: met 06/14/2021   3.  Patient educated on diaphragmatic breathing to lengthen the pelvic floor.  Baseline:  Goal status: Met 07/07/2021     LONG TERM GOALS: Target date: 08/23/2021    Patient independent with advanced HEP for pelvic floor lengthening.  Baseline:  Goal status: ongoing 08/11/2021   2.  Patient is able to have penile penetration vaginally with 0-1/10 pain level and Marinoff score is 1/3.  Baseline:  Goal status: ongoing 08/11/2021   3.  Pelvic floor strength is 3/5 with full relaxation after contraction and ability to hug the therapist finger due to improved lengthening of the muscle.  Baseline:  Goal status: ongoing 08/11/2021   4.  Vaginal dryness improved >= 50% due to using vaginal moisturizers to improve tissue health.  Baseline:  Goal status: met 06/28/2021   PLAN: PT FREQUENCY: 1x/week   PT DURATION: 12 weeks   PLANNED INTERVENTIONS: Therapeutic exercises, Therapeutic activity, Neuromuscular re-education, Patient/Family education, Joint mobilization, Dry Needling, Electrical stimulation, and Manual therapy   PLAN  FOR NEXT SESSION: manual work to release outside the vaginal area then work internally, see if she was able to increase the dilator Earlie Counts, PT 08/11/21 4:48 PM

## 2021-08-19 DIAGNOSIS — C50419 Malignant neoplasm of upper-outer quadrant of unspecified female breast: Secondary | ICD-10-CM | POA: Diagnosis not present

## 2021-08-30 ENCOUNTER — Ambulatory Visit: Payer: 59 | Admitting: Physical Therapy

## 2021-08-30 ENCOUNTER — Encounter: Payer: Self-pay | Admitting: Physical Therapy

## 2021-08-30 DIAGNOSIS — Z483 Aftercare following surgery for neoplasm: Secondary | ICD-10-CM | POA: Diagnosis not present

## 2021-08-30 DIAGNOSIS — R252 Cramp and spasm: Secondary | ICD-10-CM | POA: Diagnosis not present

## 2021-08-30 NOTE — Therapy (Signed)
OUTPATIENT PHYSICAL THERAPY TREATMENT NOTE   Patient Name: Sydney Herman MRN: [id number] DOB:[birth year], 41 y.o., female Today's Date: 08/30/2021  PCP: Luetta Nutting, MD REFERRING PROVIDER: Nicholas Lose, MD  END OF SESSION:   PT End of Session - 08/30/21 0931     Visit Number 9    Date for PT Re-Evaluation 11/14/21    Authorization Type [facility]    Authorization - Visit Number 9    Authorization - Number of Visits 24    PT Start Time 0930    PT Stop Time 1010    PT Time Calculation (min) 40 min    Activity Tolerance Patient tolerated treatment well    Behavior During Therapy [facility] for tasks assessed/performed             Past Medical History:  Diagnosis Date   Abdominal wall mass of right lower quadrant 05/17/2013   Saw Dr. Georgette Dover 2015- was thought to be scar tissue- no growth since that time    Breast cancer ([place]) 01/23/2020   Right   Chicken pox    Family history of esophageal cancer    Family history of prostate cancer    Family history of uterine cancer    Generalized headaches    Has had some migraines in past as well. once a week or less. Usually occipital and associated with sinus pressure as well.    Heartburn in pregnancy    Past Surgical History:  Procedure Laterality Date   BREAST RECONSTRUCTION WITH PLACEMENT OF TISSUE EXPANDER AND ALLODERM Bilateral 09/09/2020   Procedure: BILATERAL BREAST RECONSTRUCTION WITH PLACEMENT OF TISSUE EXPANDER AND ALLODERM;  Surgeon: Irene Limbo, MD;  Location: [place];  Service: Plastics;  Laterality: Bilateral;   CESAREAN SECTION  07/11/2010   [facility]   CESAREAN SECTION N/A 12/27/2012   womens 2nd    NIPPLE SPARING MASTECTOMY Left 09/09/2020   Procedure: LEFT NIPPLE SPARING MASTECTOMY;  Surgeon: Rolm Bookbinder, MD;  Location: [place];  Service: General;  Laterality: Left;   NIPPLE SPARING MASTECTOMY WITH SENTINEL LYMPH NODE BIOPSY Right 09/09/2020   Procedure: RIGHT  NIPPLE SPARING MASTECTOMY WITH RIGHT AXILLARY SENTINEL LYMPH NODE BIOPSY;  Surgeon: Rolm Bookbinder, MD;  Location: [place];  Service: General;  Laterality: Right;   PORTACATH PLACEMENT Right 02/04/2020   Procedure: INSERTION PORT-A-CATH WITH ULTRASOUND;  Surgeon: Rolm Bookbinder, MD;  Location: [place];  Service: General;  Laterality: Right;   REMOVAL OF BILATERAL TISSUE EXPANDERS WITH PLACEMENT OF BILATERAL BREAST IMPLANTS Bilateral 12/15/2020   Procedure: REMOVAL OF BILATERAL TISSUE EXPANDERS WITH PLACEMENT OF BILATERAL BREAST SILICONE IMPLANTS;  Surgeon: Irene Limbo, MD;  Location: [place];  Service: Plastics;  Laterality: Bilateral;   TOTAL ABDOMINAL HYSTERECTOMY N/A    WISDOM TOOTH EXTRACTION     Patient Active Problem List   Diagnosis Date Noted   Well adult exam 04/27/2021   Adjustment reaction with anxiety 04/27/2021   Malignant tumor of breast ([place]) 05/12/2020   Port-A-Cath in place 03/19/2020   BRCA2 gene mutation positive 02/21/2020   Family history of prostate cancer    Family history of uterine cancer    Family history of esophageal cancer    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   History of abnormal cervical Pap smear 11/16/2018   Family history of type 2 diabetes mellitus in father 11/16/2018   Family history of breast cancer in mother 11/16/2018   Mild  hyperlipidemia 11/02/2017   Vitamin B12 deficiency 02/20/2017   LGSIL (low grade squamous intraepithelial lesion) on Pap smear 10/02/2011   REFERRING DIAG: N89.8 Vaginal Dryness   THERAPY DIAG:  Aftercare following surgery for neoplasm   Cramp and spasm   Rationale for Evaluation and Treatment Rehabilitation   PERTINENT HISTORY: Right breast cancer; C-section x2; hysterectomy   PRECAUTIONS: cancer   SUBJECTIVE: I have gone up a size since last visit. I am on the 4th size. The dilator did not go all the way in  and only felt pressure instead of pain.  PAIN:  Are you having pain? Yes NPRS scale: 7/10 Pain location: Internal   Pain type: tight Pain description:  tearing     Aggravating factors: penile penetration Relieving factors: no penile penetration vaginally   PATIENT GOALS reduce pain and improve dryness   INTERCOURSE Pain with intercourse: Initial Penetration, after intercourse and during Ability to have vaginal penetration:  No Climax: able to climax Marinoff Scale: 3/3   OBJECTIVE: (objective measures completed at initial evaluation unless otherwise dated)   COGNITION:            Overall cognitive status: Within functional limits for tasks assessed                          SENSATION:            Light touch: Appears intact            Proprioception: Appears intact      POSTURE:  good   LUMBARAROM/PROM lumbar ROM is full.      LOWER EXTREMITY ROM: Bilateral hip ROM is full.    LOWER EXTREMITY MMT:   MMT Right eval Left eval Right 07/12/2021 Left  07/12/2021 Right 08/11/2021 Left 08/11/2021  Hip abduction 4/5 4/5 4/5 4/5 4/5 4/5  Hip adduction 4/5 4/5 5/5 5/5 5/5 5/5    PELVIC MMT:   MMT eval 08/11/2021  Vaginal 2/5 with less contraction on the sides 2/5 with circular contratcion  (Blank rows = not tested)         PALPATION:   General  Good mobility of the abdominal scars for the hysterectomy. Restrictions of the c-section scar                 External Perineal Exam vaginal dryness                             Internal Pelvic Floor tenderness located on the obturator internist, iliococcygeus; firmness   TONE: increased   PROLAPSE: none   TODAY'S TREATMENT  08/30/2021 Manual: Myofascial release:release of the urogenital diaphragm going through the different planes  of the fascia Internal pelvic floor techniques:No emotional/communication barriers or cognitive limitation. Patient is motivated to learn. Patient understands and agrees with treatment goals and  plan. PT explains patient will be examined in standing, sitting, and lying down to see how their muscles and joints work. When they are ready, they will be asked to remove their underwear so PT can examine their perineum. The patient is also given the option of providing their own chaperone as one is not provided in our facility. The patient also has the right and is explained the right to defer or refuse any part of the evaluation or treatment including the internal exam. With the patient's consent, PT will use one gloved finger to gently assess the muscles of the  pelvic floor, seeing how well it contracts and relaxes and if there is muscle symmetry. After, the patient will get dressed and PT and patient will discuss exam findings and plan of care. PT and patient discuss plan of care, schedule, attendance policy and HEP activities.  Going through the vaginal canal with one finger internally and other externally working on the sides of the vaginal opening,  Fascial release along the superior anterior aspect of the vaginal canal above the bladder with one finger internal and other external  Release of the vaginal vault Using two fingers internally working on the posterior vaginal canal.  Exercises: Stretches/mobility:double knee to chest holding 30 sec.  Trunk rotation with brining leg over the body holding 30 sec. Bil.  Pigeon pose holding for 30 sec bil  Frog leg stretch rocking back and forth.     08/11/2021 Manual: Myofascial release:release of the urogenital diaphragm Internal pelvic floor techniques:No emotional/communication barriers or cognitive limitation. Patient is motivated to learn. Patient understands and agrees with treatment goals and plan. PT explains patient will be examined in standing, sitting, and lying down to see how their muscles and joints work. When they are ready, they will be asked to remove their underwear so PT can examine their perineum. The patient is also given the option  of providing their own chaperone as one is not provided in our facility. The patient also has the right and is explained the right to defer or refuse any part of the evaluation or treatment including the internal exam. With the patient's consent, PT will use one gloved finger to gently assess the muscles of the pelvic floor, seeing how well it contracts and relaxes and if there is muscle symmetry. After, the patient will get dressed and PT and patient will discuss exam findings and plan of care. PT and patient discuss plan of care, schedule, attendance policy and HEP activities. Therapist going through the vaginal canal working the tissue to elongate the introitus for the dilators with 2 fingers.  Therapist then placed her finger into the vaginal canal working on the levator ani with 1 finger.     07/26/2021 Manual: Soft tissue mobilization:to bilateral hip adductors Myofascial release:release of the urogenital diaphragm Internal pelvic floor techniques:No emotional/communication barriers or cognitive limitation. Patient is motivated to learn. Patient understands and agrees with treatment goals and plan. PT explains patient will be examined in standing, sitting, and lying down to see how their muscles and joints work. When they are ready, they will be asked to remove their underwear so PT can examine their perineum. The patient is also given the option of providing their own chaperone as one is not provided in our facility. The patient also has the right and is explained the right to defer or refuse any part of the evaluation or treatment including the internal exam. With the patient's consent, PT will use one gloved finger to gently assess the muscles of the pelvic floor, seeing how well it contracts and relaxes and if there is muscle symmetry. After, the patient will get dressed and PT and patient will discuss exam findings and plan of care. PT and patient discuss plan of care, schedule, attendance policy and  HEP activities. Going through vaginally working on the introitus to elongate the tissue for penile penetration Exercises: Stretches/mobility:using a ball to massage the bottoms of her feet to relax the pelvic floor and help with her foot pain.      PATIENT EDUCATION: 07/07/2021 Education details: education on  using vaginal dilators and how to progress using water based lubricant Person educated: Patient Education method: Explanation, Demonstration, Tactile cues, and Verbal cues Education comprehension: verbalized understanding, returned demonstration, verbal cues required, tactile cues required, and needs further education       HOME EXERCISE PROGRAM: 06/14/2021 Access Code: [email or web address] URL: [email or web address] Date: 06/14/2021 Prepared by: Earlie Counts   Exercises - Happy Baby with Pelvic Floor Lengthening  - 1 x daily - 7 x weekly - 1 sets - 1 reps - 30 sec hold - Supine Piriformis Stretch with Leg Straight  - 1 x daily - 7 x weekly - 1 sets - 2 reps - 30 sec hold - Supine Hamstring Stretch  - 1 x daily - 7 x weekly - 1 sets - 2 reps - 30 sec hold - Cat Cow  - 1 x daily - 7 x weekly - 1 sets - 10 reps - Child's Pose Stretch  - 1 x daily - 7 x weekly - 3 sets - 10 reps - Child's Pose with Sidebending  - 1 x daily - 7 x weekly - 1 sets - 2 reps - 30 sec hold - Sidelying Reverse Clamshell  - 1 x daily - 7 x weekly - 2 sets - 5 reps - Supine Diaphragmatic Breathing  - 1 x daily - 7 x weekly - 1 sets - 10 reps     ASSESSMENT:   CLINICAL IMPRESSION: Patient is a 41  y.o. female  who was seen today for physical therapy  treatment for vaginal dryness and painful intercourse.  Patient had some restrictions on the anterior wall of the vaginal canal above the bladder. Patient only felt pressure as the therapist placed 2 fingers into the vaginal canal. She is now on the fourth dilator. Patient will benefit from skilled therapy to improve pelvic floor elongation so she is able to have  vaginal penetration vaginally.      OBJECTIVE IMPAIRMENTS decreased activity tolerance, decreased coordination, decreased strength, increased fascial restrictions, increased muscle spasms, impaired tone, and pain.    ACTIVITY LIMITATIONS  vaginal penetration    PARTICIPATION LIMITATIONS:  interpersonal relationship   PERSONAL FACTORS 3+ comorbidities: right breast cancer 01/23/2020; hysterectomy, c-section x2  are also affecting patient's functional outcome.    REHAB POTENTIAL: Excellent   CLINICAL DECISION MAKING: Stable/uncomplicated   EVALUATION COMPLEXITY: Low     GOALS: Goals reviewed with patient? Yes   SHORT TERM GOALS: Target date: 06/28/2021   Patient educated on vaginal moisturizers and lubricants to improve vaginal health.  Baseline: Goal status: met 06/14/2021   2.  Patient educated on perineal massage to assist with vaginal elongation.  Baseline:  Goal status: met 06/14/2021   3.  Patient educated on diaphragmatic breathing to lengthen the pelvic floor.  Baseline:  Goal status: Met 07/07/2021     LONG TERM GOALS: Target date: 08/23/2021    Patient independent with advanced HEP for pelvic floor lengthening.  Baseline:  Goal status: ongoing 08/11/2021   2.  Patient is able to have penile penetration vaginally with 0-1/10 pain level and Marinoff score is 1/3.  Baseline:  Goal status: ongoing 08/11/2021   3.  Pelvic floor strength is 3/5 with full relaxation after contraction and ability to hug the therapist finger due to improved lengthening of the muscle.  Baseline:  Goal status: ongoing 08/11/2021   4.  Vaginal dryness improved >= 50% due to using vaginal moisturizers to improve tissue health.  Baseline:  Goal status: met 06/28/2021  PLAN: PT FREQUENCY: 1x/week   PT DURATION: 12 weeks   PLANNED INTERVENTIONS: Therapeutic exercises, Therapeutic activity, Neuromuscular re-education, Patient/Family education, Joint mobilization, Dry Needling, Electrical  stimulation, and Manual therapy   PLAN FOR NEXT SESSION: manual work to release outside the vaginal area then work internally, see if she was able to increase the dilator  Earlie Counts, PT 08/30/21 10:14 AM

## 2021-08-31 ENCOUNTER — Telehealth: Payer: Self-pay

## 2021-08-31 NOTE — Telephone Encounter (Signed)
Patient called and informed of recent [employer] results. Patient informed that Johnsie Cancel would be reaching out to schedule her for follow-up testing again in three months. Patient verbalized an understanding of the information and was appreciative of the call.

## 2021-09-02 DIAGNOSIS — Z1501 Genetic susceptibility to malignant neoplasm of breast: Secondary | ICD-10-CM | POA: Diagnosis not present

## 2021-09-02 DIAGNOSIS — Z9013 Acquired absence of bilateral breasts and nipples: Secondary | ICD-10-CM | POA: Diagnosis not present

## 2021-09-02 DIAGNOSIS — Z1509 Genetic susceptibility to other malignant neoplasm: Secondary | ICD-10-CM | POA: Diagnosis not present

## 2021-09-02 DIAGNOSIS — Z853 Personal history of malignant neoplasm of breast: Secondary | ICD-10-CM | POA: Diagnosis not present

## 2021-09-10 DIAGNOSIS — Z170 Estrogen receptor positive status [ER+]: Secondary | ICD-10-CM | POA: Diagnosis not present

## 2021-09-10 DIAGNOSIS — C50411 Malignant neoplasm of upper-outer quadrant of right female breast: Secondary | ICD-10-CM | POA: Diagnosis not present

## 2021-09-15 ENCOUNTER — Encounter: Payer: Self-pay | Admitting: Physical Therapy

## 2021-09-15 ENCOUNTER — Ambulatory Visit: Payer: 59 | Attending: Hematology and Oncology | Admitting: Physical Therapy

## 2021-09-15 DIAGNOSIS — Z483 Aftercare following surgery for neoplasm: Secondary | ICD-10-CM | POA: Insufficient documentation

## 2021-09-15 DIAGNOSIS — R252 Cramp and spasm: Secondary | ICD-10-CM | POA: Diagnosis not present

## 2021-09-15 NOTE — Therapy (Signed)
P OUTPATIENT PHYSICAL THERAPY TREATMENT NOTE   Patient Name: Sydney Herman MRN: [id number] DOB:[birth year], 41 y.o., female Today's Date: 09/15/2021  PCP: Luetta Nutting, MD REFERRING PROVIDER: Nicholas Lose, MD  END OF SESSION:   PT End of Session - 09/15/21 0800     Visit Number 10    Date for PT Re-Evaluation 11/14/21    Authorization Type [facility] [employer]    Authorization - Visit Number 10    Authorization - Number of Visits 24    PT Start Time 0800    PT Stop Time 0850    PT Time Calculation (min) 50 min    Activity Tolerance Patient tolerated treatment well    Behavior During Therapy [facility] for tasks assessed/performed             Past Medical History:  Diagnosis Date   Abdominal wall mass of right lower quadrant 05/17/2013   Saw Dr. Georgette Dover 2015- was thought to be scar tissue- no growth since that time    Breast cancer ([place]) 01/23/2020   Right   Chicken pox    Family history of esophageal cancer    Family history of prostate cancer    Family history of uterine cancer    Generalized headaches    Has had some migraines in past as well. once a week or less. Usually occipital and associated with sinus pressure as well.    Heartburn in pregnancy    Past Surgical History:  Procedure Laterality Date   BREAST RECONSTRUCTION WITH PLACEMENT OF TISSUE EXPANDER AND ALLODERM Bilateral 09/09/2020   Procedure: BILATERAL BREAST RECONSTRUCTION WITH PLACEMENT OF TISSUE EXPANDER AND ALLODERM;  Surgeon: Irene Limbo, MD;  Location: [place];  Service: Plastics;  Laterality: Bilateral;   CESAREAN SECTION  07/11/2010   [facility]   CESAREAN SECTION N/A 12/27/2012   womens 2nd    NIPPLE SPARING MASTECTOMY Left 09/09/2020   Procedure: LEFT NIPPLE SPARING MASTECTOMY;  Surgeon: Rolm Bookbinder, MD;  Location: [place];  Service: General;  Laterality: Left;   NIPPLE SPARING MASTECTOMY WITH SENTINEL LYMPH NODE BIOPSY Right 09/09/2020   Procedure: RIGHT  NIPPLE SPARING MASTECTOMY WITH RIGHT AXILLARY SENTINEL LYMPH NODE BIOPSY;  Surgeon: Rolm Bookbinder, MD;  Location: [place];  Service: General;  Laterality: Right;   PORTACATH PLACEMENT Right 02/04/2020   Procedure: INSERTION PORT-A-CATH WITH ULTRASOUND;  Surgeon: Rolm Bookbinder, MD;  Location: [place];  Service: General;  Laterality: Right;   REMOVAL OF BILATERAL TISSUE EXPANDERS WITH PLACEMENT OF BILATERAL BREAST IMPLANTS Bilateral 12/15/2020   Procedure: REMOVAL OF BILATERAL TISSUE EXPANDERS WITH PLACEMENT OF BILATERAL BREAST SILICONE IMPLANTS;  Surgeon: Irene Limbo, MD;  Location: [place];  Service: Plastics;  Laterality: Bilateral;   TOTAL ABDOMINAL HYSTERECTOMY N/A    WISDOM TOOTH EXTRACTION     Patient Active Problem List   Diagnosis Date Noted   Well adult exam 04/27/2021   Adjustment reaction with anxiety 04/27/2021   Malignant tumor of breast ([place]) 05/12/2020   Port-A-Cath in place 03/19/2020   BRCA2 gene mutation positive 02/21/2020   Family history of prostate cancer    Family history of uterine cancer    Family history of esophageal cancer    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   History of abnormal cervical Pap smear 11/16/2018   Family history of type 2 diabetes mellitus in father 11/16/2018   Family history of breast cancer in mother 11/16/2018  Mild hyperlipidemia 11/02/2017   Vitamin B12 deficiency 02/20/2017   LGSIL (low grade squamous intraepithelial lesion) on Pap smear 10/02/2011   REFERRING DIAG: N89.8 Vaginal Dryness   THERAPY DIAG:  Aftercare following surgery for neoplasm   Cramp and spasm   Rationale for Evaluation and Treatment Rehabilitation   PERTINENT HISTORY: Right breast cancer; C-section x2; hysterectomy   PRECAUTIONS: cancer   SUBJECTIVE: I am still on the 4th size. I was only able to do one time last week. Vaginal intercourse is  getting easier. More pressure feeling.  PAIN:  Are you having pain? Yes NPRS scale: 3/10 Pain location: Internal   Pain type: tight Pain description:  tearing     Aggravating factors: penile penetration Relieving factors: no penile penetration vaginally   PATIENT GOALS reduce pain and improve dryness   INTERCOURSE Pain with intercourse: Initial Penetration, after intercourse and during Ability to have vaginal penetration:  No Climax: able to climax Marinoff Scale: 3/3   OBJECTIVE: (objective measures completed at initial evaluation unless otherwise dated)   COGNITION:            Overall cognitive status: Within functional limits for tasks assessed                          SENSATION:            Light touch: Appears intact            Proprioception: Appears intact      POSTURE:  good   LUMBARAROM/PROM lumbar ROM is full.      LOWER EXTREMITY ROM: Bilateral hip ROM is full.    LOWER EXTREMITY MMT:   MMT Right eval Left eval Right 07/12/2021 Left  07/12/2021 Right 08/11/2021 Left 08/11/2021  Hip abduction 4/5 4/5 4/5 4/5 4/5 4/5  Hip adduction 4/5 4/5 5/5 5/5 5/5 5/5    PELVIC MMT:   MMT eval 08/11/2021  Vaginal 2/5 with less contraction on the sides 2/5 with circular contratcion  (Blank rows = not tested)         PALPATION:   General  Good mobility of the abdominal scars for the hysterectomy. Restrictions of the c-section scar                 External Perineal Exam vaginal dryness                             Internal Pelvic Floor tenderness located on the obturator internist, iliococcygeus; firmness   TONE: increased   PROLAPSE: none   TODAY'S TREATMENT  09/15/2021 Manual: Soft tissue mobilization:to bilateral hip adductors to lengthen and relax the muscles.  Internal pelvic floor techniques:No emotional/communication barriers or cognitive limitation. Patient is motivated to learn. Patient understands and agrees with treatment goals and plan. PT explains  patient will be examined in standing, sitting, and lying down to see how their muscles and joints work. When they are ready, they will be asked to remove their underwear so PT can examine their perineum. The patient is also given the option of providing their own chaperone as one is not provided in our facility. The patient also has the right and is explained the right to defer or refuse any part of the evaluation or treatment including the internal exam. With the patient's consent, PT will use one gloved finger to gently assess the muscles of the pelvic floor, seeing how well it contracts  and relaxes and if there is muscle symmetry. After, the patient will get dressed and PT and patient will discuss exam findings and plan of care. PT and patient discuss plan of care, schedule, attendance policy and HEP activities.  Going through vaginally working on the introitus with expanding the opening One finger deep into the canal working on the anterior wall of the vaginal canal and other hand on the outside releasing the fascial restrictions then moved hip into extension and rotated the body Exercises: Stretches/mobility:supine trunk rotation brining the leg over the trunk hold for 30 seconds  Pigeon pose holding for 30 sec bil  Frog leg stretch rocking back and forth.    08/30/2021 Manual: Myofascial release:release of the urogenital diaphragm going through the different planes  of the fascia Internal pelvic floor techniques:No emotional/communication barriers or cognitive limitation. Patient is motivated to learn. Patient understands and agrees with treatment goals and plan. PT explains patient will be examined in standing, sitting, and lying down to see how their muscles and joints work. When they are ready, they will be asked to remove their underwear so PT can examine their perineum. The patient is also given the option of providing their own chaperone as one is not provided in our facility. The patient also  has the right and is explained the right to defer or refuse any part of the evaluation or treatment including the internal exam. With the patient's consent, PT will use one gloved finger to gently assess the muscles of the pelvic floor, seeing how well it contracts and relaxes and if there is muscle symmetry. After, the patient will get dressed and PT and patient will discuss exam findings and plan of care. PT and patient discuss plan of care, schedule, attendance policy and HEP activities.  Going through the vaginal canal with one finger internally and other externally working on the sides of the vaginal opening,  Fascial release along the superior anterior aspect of the vaginal canal above the bladder with one finger internal and other external  Release of the vaginal vault Using two fingers internally working on the posterior vaginal canal.  Exercises: Stretches/mobility:double knee to chest holding 30 sec.  Trunk rotation with brining leg over the body holding 30 sec. Bil.  Pigeon pose holding for 30 sec bil  Frog leg stretch rocking back and forth.     08/11/2021 Manual: Myofascial release:release of the urogenital diaphragm Internal pelvic floor techniques:No emotional/communication barriers or cognitive limitation. Patient is motivated to learn. Patient understands and agrees with treatment goals and plan. PT explains patient will be examined in standing, sitting, and lying down to see how their muscles and joints work. When they are ready, they will be asked to remove their underwear so PT can examine their perineum. The patient is also given the option of providing their own chaperone as one is not provided in our facility. The patient also has the right and is explained the right to defer or refuse any part of the evaluation or treatment including the internal exam. With the patient's consent, PT will use one gloved finger to gently assess the muscles of the pelvic floor, seeing how well it  contracts and relaxes and if there is muscle symmetry. After, the patient will get dressed and PT and patient will discuss exam findings and plan of care. PT and patient discuss plan of care, schedule, attendance policy and HEP activities. Therapist going through the vaginal canal working the tissue to elongate the introitus for  the dilators with 2 fingers.  Therapist then placed her finger into the vaginal canal working on the levator ani with 1 finger.         PATIENT EDUCATION: 07/07/2021 Education details: education on using vaginal dilators and how to progress using water based lubricant Person educated: Patient Education method: Explanation, Demonstration, Tactile cues, and Verbal cues Education comprehension: verbalized understanding, returned demonstration, verbal cues required, tactile cues required, and needs further education       HOME EXERCISE PROGRAM: 06/14/2021 Access Code: [email or web address] URL: [email or web address] Date: 06/14/2021 Prepared by: Earlie Counts   Exercises - Happy Baby with Pelvic Floor Lengthening  - 1 x daily - 7 x weekly - 1 sets - 1 reps - 30 sec hold - Supine Piriformis Stretch with Leg Straight  - 1 x daily - 7 x weekly - 1 sets - 2 reps - 30 sec hold - Supine Hamstring Stretch  - 1 x daily - 7 x weekly - 1 sets - 2 reps - 30 sec hold - Cat Cow  - 1 x daily - 7 x weekly - 1 sets - 10 reps - Child's Pose Stretch  - 1 x daily - 7 x weekly - 3 sets - 10 reps - Child's Pose with Sidebending  - 1 x daily - 7 x weekly - 1 sets - 2 reps - 30 sec hold - Sidelying Reverse Clamshell  - 1 x daily - 7 x weekly - 2 sets - 5 reps - Supine Diaphragmatic Breathing  - 1 x daily - 7 x weekly - 1 sets - 10 reps     ASSESSMENT:   CLINICAL IMPRESSION: Patient is a 42  y.o. female  who was seen today for physical therapy  treatment for vaginal dryness and painful intercourse.  Vaginal intercourse more like pressure than pain. Pain level 3/10.  Patient had restrictions  located on the lower abdominal area internally and externally. She is having pressure more when the penis is deep into the vaginal canal due to the length of the vagina is less. Patient will benefit from skilled therapy to improve pelvic floor elongation so she is able to have vaginal penetration vaginally.      OBJECTIVE IMPAIRMENTS decreased activity tolerance, decreased coordination, decreased strength, increased fascial restrictions, increased muscle spasms, impaired tone, and pain.    ACTIVITY LIMITATIONS  vaginal penetration    PARTICIPATION LIMITATIONS:  interpersonal relationship   PERSONAL FACTORS 3+ comorbidities: right breast cancer 01/23/2020; hysterectomy, c-section x2  are also affecting patient's functional outcome.    REHAB POTENTIAL: Excellent   CLINICAL DECISION MAKING: Stable/uncomplicated   EVALUATION COMPLEXITY: Low     GOALS: Goals reviewed with patient? Yes   SHORT TERM GOALS: Target date: 06/28/2021   Patient educated on vaginal moisturizers and lubricants to improve vaginal health.  Baseline: Goal status: met 06/14/2021   2.  Patient educated on perineal massage to assist with vaginal elongation.  Baseline:  Goal status: met 06/14/2021   3.  Patient educated on diaphragmatic breathing to lengthen the pelvic floor.  Baseline:  Goal status: Met 07/07/2021     LONG TERM GOALS: Target date: 08/23/2021    Patient independent with advanced HEP for pelvic floor lengthening.  Baseline:  Goal status: ongoing 08/11/2021   2.  Patient is able to have penile penetration vaginally with 0-1/10 pain level and Marinoff score is 1/3.  Baseline: 3/10  with pressure Goal status: ongoing 08/11/2021   3.  Pelvic floor strength is 3/5  with full relaxation after contraction and ability to hug the therapist finger due to improved lengthening of the muscle.  Baseline:  Goal status: ongoing 08/11/2021   4.  Vaginal dryness improved >= 50% due to using vaginal moisturizers to  improve tissue health.  Baseline:  Goal status: met 06/28/2021   PLAN: PT FREQUENCY: 1x/week   PT DURATION: 12 weeks   PLANNED INTERVENTIONS: Therapeutic exercises, Therapeutic activity, Neuromuscular re-education, Patient/Family education, Joint mobilization, Dry Needling, Electrical stimulation, and Manual therapy   PLAN FOR NEXT SESSION: manual work to release outside the vaginal area then work internally, see if she was able to increase the dilator Earlie Counts, PT 09/15/21 8:58 AM

## 2021-09-27 ENCOUNTER — Ambulatory Visit: Payer: 59 | Admitting: Physical Therapy

## 2021-09-27 ENCOUNTER — Encounter: Payer: Self-pay | Admitting: Physical Therapy

## 2021-09-27 DIAGNOSIS — R252 Cramp and spasm: Secondary | ICD-10-CM | POA: Diagnosis not present

## 2021-09-27 DIAGNOSIS — Z483 Aftercare following surgery for neoplasm: Secondary | ICD-10-CM

## 2021-09-27 NOTE — Therapy (Signed)
OUTPATIENT PHYSICAL THERAPY TREATMENT NOTE   Patient Name: Sydney Herman MRN: [id number] DOB:05-11-1980, 41 y.o., female Today's Date: 09/27/2021  PCP: Luetta Nutting, MD REFERRING PROVIDER: Nicholas Lose, MD  END OF SESSION:   PT End of Session - 09/27/21 0933     Visit Number 11    Date for PT Re-Evaluation 11/14/21    Authorization Type [facility]    Authorization - Visit Number 11    Authorization - Number of Visits 24    PT Start Time 0930    PT Stop Time 1010    PT Time Calculation (min) 40 min    Activity Tolerance Patient tolerated treatment well    Behavior During Therapy [facility] for tasks assessed/performed             Past Medical History:  Diagnosis Date   Abdominal wall mass of right lower quadrant 05/17/2013   Saw Dr. Georgette Dover 2015- was thought to be scar tissue- no growth since that time    Breast cancer ([place]) 01/23/2020   Right   Chicken pox    Family history of esophageal cancer    Family history of prostate cancer    Family history of uterine cancer    Generalized headaches    Has had some migraines in past as well. once a week or less. Usually occipital and associated with sinus pressure as well.    Heartburn in pregnancy    Past Surgical History:  Procedure Laterality Date   BREAST RECONSTRUCTION WITH PLACEMENT OF TISSUE EXPANDER AND ALLODERM Bilateral 09/09/2020   Procedure: BILATERAL BREAST RECONSTRUCTION WITH PLACEMENT OF TISSUE EXPANDER AND ALLODERM;  Surgeon: Irene Limbo, MD;  Location: [place];  Service: Plastics;  Laterality: Bilateral;   CESAREAN SECTION  07/11/2010   [facility]   CESAREAN SECTION N/A 12/27/2012   womens 2nd    NIPPLE SPARING MASTECTOMY Left 09/09/2020   Procedure: LEFT NIPPLE SPARING MASTECTOMY;  Surgeon: Rolm Bookbinder, MD;  Location: [place];  Service: General;  Laterality: Left;   NIPPLE SPARING MASTECTOMY WITH SENTINEL LYMPH NODE BIOPSY Right 09/09/2020   Procedure: RIGHT  NIPPLE SPARING MASTECTOMY WITH RIGHT AXILLARY SENTINEL LYMPH NODE BIOPSY;  Surgeon: Rolm Bookbinder, MD;  Location: [place];  Service: General;  Laterality: Right;   PORTACATH PLACEMENT Right 02/04/2020   Procedure: INSERTION PORT-A-CATH WITH ULTRASOUND;  Surgeon: Rolm Bookbinder, MD;  Location: [place];  Service: General;  Laterality: Right;   REMOVAL OF BILATERAL TISSUE EXPANDERS WITH PLACEMENT OF BILATERAL BREAST IMPLANTS Bilateral 12/15/2020   Procedure: REMOVAL OF BILATERAL TISSUE EXPANDERS WITH PLACEMENT OF BILATERAL BREAST SILICONE IMPLANTS;  Surgeon: Irene Limbo, MD;  Location: [place];  Service: Plastics;  Laterality: Bilateral;   TOTAL ABDOMINAL HYSTERECTOMY N/A    WISDOM TOOTH EXTRACTION     Patient Active Problem List   Diagnosis Date Noted   Well adult exam 04/27/2021   Adjustment reaction with anxiety 04/27/2021   Malignant tumor of breast ([place]) 05/12/2020   Port-A-Cath in place 03/19/2020   BRCA2 gene mutation positive 02/21/2020   Family history of prostate cancer    Family history of uterine cancer    Family history of esophageal cancer    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   History of abnormal cervical Pap smear 11/16/2018   Family history of type 2 diabetes mellitus in father 11/16/2018   Family history of breast cancer in mother 11/16/2018   Mild  hyperlipidemia 11/02/2017   Vitamin B12 deficiency 02/20/2017   LGSIL (low grade squamous intraepithelial lesion) on Pap smear 10/02/2011   REFERRING DIAG: N89.8 Vaginal Dryness   THERAPY DIAG:  Aftercare following surgery for neoplasm   Cramp and spasm   Rationale for Evaluation and Treatment Rehabilitation   PERTINENT HISTORY: Right breast cancer; C-section x2; hysterectomy   PRECAUTIONS: cancer   SUBJECTIVE: I am with the second to largest dilator. I am scred to use the larges with the deeper the dilator  is. I get very crampy.  PAIN:  Are you having pain? Yes NPRS scale: 3/10 Pain location: Internal   Pain type: tight Pain description:  tearing     Aggravating factors: penile penetration Relieving factors: no penile penetration vaginally   PATIENT GOALS reduce pain and improve dryness   INTERCOURSE Pain with intercourse: Initial Penetration, after intercourse and during Ability to have vaginal penetration:  No Climax: able to climax Marinoff Scale: 3/3   OBJECTIVE: (objective measures completed at initial evaluation unless otherwise dated)   COGNITION:            Overall cognitive status: Within functional limits for tasks assessed                          SENSATION:            Light touch: Appears intact            Proprioception: Appears intact      POSTURE:  good   LUMBARAROM/PROM lumbar ROM is full.      LOWER EXTREMITY ROM: Bilateral hip ROM is full.    LOWER EXTREMITY MMT:   MMT Right eval Left eval Right 07/12/2021 Left  07/12/2021 Right 08/11/2021 Left 08/11/2021  Hip abduction 4/5 4/5 4/5 4/5 4/5 4/5  Hip adduction 4/5 4/5 5/5 5/5 5/5 5/5    PELVIC MMT:   MMT eval 08/11/2021 09/27/2021  Vaginal 2/5 with less contraction on the sides 2/5 with circular contraction 3/5  (Blank rows = not tested)         PALPATION:   General  Good mobility of the abdominal scars for the hysterectomy. Restrictions of the c-section scar                 External Perineal Exam vaginal dryness                             Internal Pelvic Floor tenderness located on the obturator internist, iliococcygeus; firmness   TONE: increased   PROLAPSE: none   TODAY'S TREATMENT  09/27/2021 Manual: Internal pelvic floor techniques:No emotional/communication barriers or cognitive limitation. Patient is motivated to learn. Patient understands and agrees with treatment goals and plan. PT explains patient will be examined in standing, sitting, and lying down to see how their muscles and  joints work. When they are ready, they will be asked to remove their underwear so PT can examine their perineum. The patient is also given the option of providing their own chaperone as one is not provided in our facility. The patient also has the right and is explained the right to defer or refuse any part of the evaluation or treatment including the internal exam. With the patient's consent, PT will use one gloved finger to gently assess the muscles of the pelvic floor, seeing how well it contracts and relaxes and if there is muscle symmetry. After, the patient will  get dressed and PT and patient will discuss exam findings and plan of care. PT and patient discuss plan of care, schedule, attendance policy and HEP activities.  Going through the vagina working on the introitus, bulbocavernosus and urogenital diaphragm One finger externally on lower abdomen and other internally working on the anterior vaginal wall releasing the fascial restrictions to elongate the vaginal canal.  Neuromuscular re-education: Pelvic floor contraction training:pelvic floor contraction with full relaxation with internal cues Exercises: Stretches/mobility:frog stretch moving back and forth,  hip rotation going to side to side Pigeon pose going back and forth to each side Stand with legs apart  and rotate trunk going back and forth  Self-care: Educated patient on using the next dilator to increase the vaginal introitus and use the smaller dilator to work on length    09/15/2021 Manual: Soft tissue mobilization:to bilateral hip adductors to lengthen and relax the muscles.  Internal pelvic floor techniques:No emotional/communication barriers or cognitive limitation. Patient is motivated to learn. Patient understands and agrees with treatment goals and plan. PT explains patient will be examined in standing, sitting, and lying down to see how their muscles and joints work. When they are ready, they will be asked to remove their  underwear so PT can examine their perineum. The patient is also given the option of providing their own chaperone as one is not provided in our facility. The patient also has the right and is explained the right to defer or refuse any part of the evaluation or treatment including the internal exam. With the patient's consent, PT will use one gloved finger to gently assess the muscles of the pelvic floor, seeing how well it contracts and relaxes and if there is muscle symmetry. After, the patient will get dressed and PT and patient will discuss exam findings and plan of care. PT and patient discuss plan of care, schedule, attendance policy and HEP activities.  Going through vaginally working on the introitus with expanding the opening One finger deep into the canal working on the anterior wall of the vaginal canal and other hand on the outside releasing the fascial restrictions then moved hip into extension and rotated the body Exercises: Stretches/mobility:supine trunk rotation brining the leg over the trunk hold for 30 seconds  Pigeon pose holding for 30 sec bil  Frog leg stretch rocking back and forth.    08/30/2021 Manual: Myofascial release:release of the urogenital diaphragm going through the different planes  of the fascia Internal pelvic floor techniques:No emotional/communication barriers or cognitive limitation. Patient is motivated to learn. Patient understands and agrees with treatment goals and plan. PT explains patient will be examined in standing, sitting, and lying down to see how their muscles and joints work. When they are ready, they will be asked to remove their underwear so PT can examine their perineum. The patient is also given the option of providing their own chaperone as one is not provided in our facility. The patient also has the right and is explained the right to defer or refuse any part of the evaluation or treatment including the internal exam. With the patient's consent, PT  will use one gloved finger to gently assess the muscles of the pelvic floor, seeing how well it contracts and relaxes and if there is muscle symmetry. After, the patient will get dressed and PT and patient will discuss exam findings and plan of care. PT and patient discuss plan of care, schedule, attendance policy and HEP activities.  Going through the vaginal canal  with one finger internally and other externally working on the sides of the vaginal opening,  Fascial release along the superior anterior aspect of the vaginal canal above the bladder with one finger internal and other external  Release of the vaginal vault Using two fingers internally working on the posterior vaginal canal.  Exercises: Stretches/mobility:double knee to chest holding 30 sec.  Trunk rotation with brining leg over the body holding 30 sec. Bil.  Pigeon pose holding for 30 sec bil  Frog leg stretch rocking back and forth.       PATIENT EDUCATION: 07/07/2021 Education details: education on using vaginal dilators and how to progress using water based lubricant Person educated: Patient Education method: Explanation, Demonstration, Tactile cues, and Verbal cues Education comprehension: verbalized understanding, returned demonstration, verbal cues required, tactile cues required, and needs further education       HOME EXERCISE PROGRAM: 06/14/2021 Access Code: [email or web address] URL: [email or web address] Date: 06/14/2021 Prepared by: Earlie Counts   Exercises - Happy Baby with Pelvic Floor Lengthening  - 1 x daily - 7 x weekly - 1 sets - 1 reps - 30 sec hold - Supine Piriformis Stretch with Leg Straight  - 1 x daily - 7 x weekly - 1 sets - 2 reps - 30 sec hold - Supine Hamstring Stretch  - 1 x daily - 7 x weekly - 1 sets - 2 reps - 30 sec hold - Cat Cow  - 1 x daily - 7 x weekly - 1 sets - 10 reps - Child's Pose Stretch  - 1 x daily - 7 x weekly - 3 sets - 10 reps - Child's Pose with Sidebending  - 1 x daily - 7 x  weekly - 1 sets - 2 reps - 30 sec hold - Sidelying Reverse Clamshell  - 1 x daily - 7 x weekly - 2 sets - 5 reps - Supine Diaphragmatic Breathing  - 1 x daily - 7 x weekly - 1 sets - 10 reps     ASSESSMENT:   CLINICAL IMPRESSION: Patient is a 41  y.o. female  who was seen today for physical therapy  treatment for vaginal dryness and painful intercourse.  Vaginal intercourse more like pressure than pain but he is able to get a little deeper.  Pain level 3/10.   She is having pressure more when the penis is deep into the vaginal canal due to the length of the vagina is less.Pelvic floor strength increased to 3/5. She has fascial restrictions along the anterior wall of the vaginal canal.  Patient will benefit from skilled therapy to improve pelvic floor elongation so she is able to have vaginal penetration vaginally.      OBJECTIVE IMPAIRMENTS decreased activity tolerance, decreased coordination, decreased strength, increased fascial restrictions, increased muscle spasms, impaired tone, and pain.    ACTIVITY LIMITATIONS  vaginal penetration    PARTICIPATION LIMITATIONS:  interpersonal relationship   PERSONAL FACTORS 3+ comorbidities: right breast cancer 01/23/2020; hysterectomy, c-section x2  are also affecting patient's functional outcome.    REHAB POTENTIAL: Excellent   CLINICAL DECISION MAKING: Stable/uncomplicated   EVALUATION COMPLEXITY: Low     GOALS: Goals reviewed with patient? Yes   SHORT TERM GOALS: Target date: 06/28/2021   Patient educated on vaginal moisturizers and lubricants to improve vaginal health.  Baseline: Goal status: met 06/14/2021   2.  Patient educated on perineal massage to assist with vaginal elongation.  Baseline:  Goal status: met 06/14/2021   3.  Patient educated on  diaphragmatic breathing to lengthen the pelvic floor.  Baseline:  Goal status: Met 07/07/2021     LONG TERM GOALS: Target date: 08/23/2021    Patient independent with advanced HEP for  pelvic floor lengthening.  Baseline:  Goal status: ongoing 08/11/2021   2.  Patient is able to have penile penetration vaginally with 0-1/10 pain level and Marinoff score is 1/3.  Baseline: 3/10  with pressure Goal status: ongoing 08/11/2021   3.  Pelvic floor strength is 3/5 with full relaxation after contraction and ability to hug the therapist finger due to improved lengthening of the muscle.  Baseline:  Goal status: ongoing 08/11/2021   4.  Vaginal dryness improved >= 50% due to using vaginal moisturizers to improve tissue health.  Baseline:  Goal status: met 06/28/2021   PLAN: PT FREQUENCY: 1x/week   PT DURATION: 12 weeks   PLANNED INTERVENTIONS: Therapeutic exercises, Therapeutic activity, Neuromuscular re-education, Patient/Family education, Joint mobilization, Dry Needling, Electrical stimulation, and Manual therapy   PLAN FOR NEXT SESSION: manual work to release outside the vaginal area then work internally, see if she was able to increase the dilator, work externally on the lower abdominal wall  Earlie Counts, PT 09/27/21 10:15 AM

## 2021-10-11 ENCOUNTER — Encounter: Payer: Self-pay | Admitting: Physical Therapy

## 2021-10-11 ENCOUNTER — Ambulatory Visit: Payer: 59 | Attending: Hematology and Oncology | Admitting: Physical Therapy

## 2021-10-11 DIAGNOSIS — Z483 Aftercare following surgery for neoplasm: Secondary | ICD-10-CM | POA: Insufficient documentation

## 2021-10-11 DIAGNOSIS — R252 Cramp and spasm: Secondary | ICD-10-CM | POA: Diagnosis not present

## 2021-10-11 NOTE — Therapy (Signed)
OUTPATIENT PHYSICAL THERAPY TREATMENT NOTE   Patient Name: Sydney Herman MRN: [id number] DOB:[birth year], 41 y.o., female Today's Date: 10/11/2021  PCP: Luetta Nutting, MD REFERRING PROVIDER: Nicholas Lose, MD   END OF SESSION:   PT End of Session - 10/11/21 0937     Visit Number 12    Date for PT Re-Evaluation 11/14/21    Authorization Type [facility] [employer]    Authorization - Visit Number 12    Authorization - Number of Visits 24    PT Start Time 0930    PT Stop Time 1010    PT Time Calculation (min) 40 min    Activity Tolerance Patient tolerated treatment well    Behavior During Therapy [facility] for tasks assessed/performed             Past Medical History:  Diagnosis Date   Abdominal wall mass of right lower quadrant 05/17/2013   Saw Dr. Georgette Dover 2015- was thought to be scar tissue- no growth since that time    Breast cancer ([place]) 01/23/2020   Right   Chicken pox    Family history of esophageal cancer    Family history of prostate cancer    Family history of uterine cancer    Generalized headaches    Has had some migraines in past as well. once a week or less. Usually occipital and associated with sinus pressure as well.    Heartburn in pregnancy    Past Surgical History:  Procedure Laterality Date   BREAST RECONSTRUCTION WITH PLACEMENT OF TISSUE EXPANDER AND ALLODERM Bilateral 09/09/2020   Procedure: BILATERAL BREAST RECONSTRUCTION WITH PLACEMENT OF TISSUE EXPANDER AND ALLODERM;  Surgeon: Irene Limbo, MD;  Location: [place];  Service: Plastics;  Laterality: Bilateral;   CESAREAN SECTION  07/11/2010   [facility]   CESAREAN SECTION N/A 12/27/2012   womens 2nd    NIPPLE SPARING MASTECTOMY Left 09/09/2020   Procedure: LEFT NIPPLE SPARING MASTECTOMY;  Surgeon: Rolm Bookbinder, MD;  Location: [place];  Service: General;  Laterality: Left;   NIPPLE SPARING MASTECTOMY WITH SENTINEL LYMPH NODE BIOPSY Right 09/09/2020   Procedure: RIGHT  NIPPLE SPARING MASTECTOMY WITH RIGHT AXILLARY SENTINEL LYMPH NODE BIOPSY;  Surgeon: Rolm Bookbinder, MD;  Location: [place];  Service: General;  Laterality: Right;   PORTACATH PLACEMENT Right 02/04/2020   Procedure: INSERTION PORT-A-CATH WITH ULTRASOUND;  Surgeon: Rolm Bookbinder, MD;  Location: [place];  Service: General;  Laterality: Right;   REMOVAL OF BILATERAL TISSUE EXPANDERS WITH PLACEMENT OF BILATERAL BREAST IMPLANTS Bilateral 12/15/2020   Procedure: REMOVAL OF BILATERAL TISSUE EXPANDERS WITH PLACEMENT OF BILATERAL BREAST SILICONE IMPLANTS;  Surgeon: Irene Limbo, MD;  Location: [place];  Service: Plastics;  Laterality: Bilateral;   TOTAL ABDOMINAL HYSTERECTOMY N/A    WISDOM TOOTH EXTRACTION     Patient Active Problem List   Diagnosis Date Noted   Well adult exam 04/27/2021   Adjustment reaction with anxiety 04/27/2021   Malignant tumor of breast ([place]) 05/12/2020   Port-A-Cath in place 03/19/2020   BRCA2 gene mutation positive 02/21/2020   Family history of prostate cancer    Family history of uterine cancer    Family history of esophageal cancer    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   History of abnormal cervical Pap smear 11/16/2018   Family history of type 2 diabetes mellitus in father 11/16/2018   Family history of breast cancer in mother 11/16/2018  Mild hyperlipidemia 11/02/2017   Vitamin B12 deficiency 02/20/2017   LGSIL (low grade squamous intraepithelial lesion) on Pap smear 10/02/2011   REFERRING DIAG: N89.8 Vaginal Dryness   THERAPY DIAG:  Aftercare following surgery for neoplasm   Cramp and spasm   Rationale for Evaluation and Treatment Rehabilitation   PERTINENT HISTORY: Right breast cancer; C-section x2; hysterectomy   PRECAUTIONS: cancer   SUBJECTIVE:I am using the largest dilator and it is getting a little further each time. I use the size below to  work on the length of the vaginal canal. I have to go slow with the dilator PAIN:  Are you having pain? Yes NPRS scale: 4/10 Pain location: Internal   Pain type: tight Pain description:  tearing     Aggravating factors: penile penetration Relieving factors: no penile penetration vaginally   PATIENT GOALS reduce pain and improve dryness   INTERCOURSE Pain with intercourse: Initial Penetration, after intercourse and during Ability to have vaginal penetration:  No Climax: able to climax Marinoff Scale: 3/3   OBJECTIVE: (objective measures completed at initial evaluation unless otherwise dated)   COGNITION:            Overall cognitive status: Within functional limits for tasks assessed                          SENSATION:            Light touch: Appears intact            Proprioception: Appears intact      POSTURE:  good   LUMBARAROM/PROM lumbar ROM is full.      LOWER EXTREMITY ROM: Bilateral hip ROM is full.    LOWER EXTREMITY MMT:   MMT Right eval Left eval Right 07/12/2021 Left  07/12/2021 Right 08/11/2021 Left 08/11/2021  Hip abduction 4/5 4/5 4/5 4/5 4/5 4/5  Hip adduction 4/5 4/5 5/5 5/5 5/5 5/5    PELVIC MMT:   MMT eval 08/11/2021 09/27/2021  Vaginal 2/5 with less contraction on the sides 2/5 with circular contraction 3/5  (Blank rows = not tested)         PALPATION:   General  Good mobility of the abdominal scars for the hysterectomy. Restrictions of the c-section scar                 External Perineal Exam vaginal dryness                             Internal Pelvic Floor tenderness located on the obturator internist, iliococcygeus; firmness   TONE: increased   PROLAPSE: none   TODAY'S TREATMENT  10/11/2021 Manual: Myofascial release:fascial release on the lower abdominal area, tissue rolling of the lower abdomen Internal pelvic floor techniques:No emotional/communication barriers or cognitive limitation. Patient is motivated to learn. Patient  understands and agrees with treatment goals and plan. PT explains patient will be examined in standing, sitting, and lying down to see how their muscles and joints work. When they are ready, they will be asked to remove their underwear so PT can examine their perineum. The patient is also given the option of providing their own chaperone as one is not provided in our facility. The patient also has the right and is explained the right to defer or refuse any part of the evaluation or treatment including the internal exam. With the patient's consent, PT will use one gloved finger to  gently assess the muscles of the pelvic floor, seeing how well it contracts and relaxes and if there is muscle symmetry. After, the patient will get dressed and PT and patient will discuss exam findings and plan of care. PT and patient discuss plan of care, schedule, attendance policy and HEP activities.  Manual work around the perineal body, along the transverse perineum, along the sides of the pubic rami.  Exercises: Stretches/mobility:frog stretch moving back and forth,  Pigeon pose going back and forth to each side Stand with legs apart  and rotate trunk going back and forth  Cat cow Side trunk stretch in quadruped    09/27/2021 Manual: Internal pelvic floor techniques:No emotional/communication barriers or cognitive limitation. Patient is motivated to learn. Patient understands and agrees with treatment goals and plan. PT explains patient will be examined in standing, sitting, and lying down to see how their muscles and joints work. When they are ready, they will be asked to remove their underwear so PT can examine their perineum. The patient is also given the option of providing their own chaperone as one is not provided in our facility. The patient also has the right and is explained the right to defer or refuse any part of the evaluation or treatment including the internal exam. With the patient's consent, PT will use one  gloved finger to gently assess the muscles of the pelvic floor, seeing how well it contracts and relaxes and if there is muscle symmetry. After, the patient will get dressed and PT and patient will discuss exam findings and plan of care. PT and patient discuss plan of care, schedule, attendance policy and HEP activities.  Going through the vagina working on the introitus, bulbocavernosus and urogenital diaphragm One finger externally on lower abdomen and other internally working on the anterior vaginal wall releasing the fascial restrictions to elongate the vaginal canal.  Neuromuscular re-education: Pelvic floor contraction training:pelvic floor contraction with full relaxation with internal cues Exercises: Stretches/mobility:frog stretch moving back and forth,  hip rotation going to side to side Pigeon pose going back and forth to each side Stand with legs apart  and rotate trunk going back and forth  Self-care: Educated patient on using the next dilator to increase the vaginal introitus and use the smaller dilator to work on length    09/15/2021 Manual: Soft tissue mobilization:to bilateral hip adductors to lengthen and relax the muscles.  Internal pelvic floor techniques:No emotional/communication barriers or cognitive limitation. Patient is motivated to learn. Patient understands and agrees with treatment goals and plan. PT explains patient will be examined in standing, sitting, and lying down to see how their muscles and joints work. When they are ready, they will be asked to remove their underwear so PT can examine their perineum. The patient is also given the option of providing their own chaperone as one is not provided in our facility. The patient also has the right and is explained the right to defer or refuse any part of the evaluation or treatment including the internal exam. With the patient's consent, PT will use one gloved finger to gently assess the muscles of the pelvic floor, seeing  how well it contracts and relaxes and if there is muscle symmetry. After, the patient will get dressed and PT and patient will discuss exam findings and plan of care. PT and patient discuss plan of care, schedule, attendance policy and HEP activities.  Going through vaginally working on the introitus with expanding the opening One finger deep into  the canal working on the anterior wall of the vaginal canal and other hand on the outside releasing the fascial restrictions then moved hip into extension and rotated the body Exercises: Stretches/mobility:supine trunk rotation brining the leg over the trunk hold for 30 seconds  Pigeon pose holding for 30 sec bil  Frog leg stretch rocking back and forth.     PATIENT EDUCATION: 07/07/2021 Education details: education on using vaginal dilators and how to progress using water based lubricant Person educated: Patient Education method: Explanation, Demonstration, Tactile cues, and Verbal cues Education comprehension: verbalized understanding, returned demonstration, verbal cues required, tactile cues required, and needs further education       HOME EXERCISE PROGRAM: 06/14/2021 Access Code: [email or web address] URL: [email or web address] Date: 06/14/2021 Prepared by: Earlie Counts   Exercises - Happy Baby with Pelvic Floor Lengthening  - 1 x daily - 7 x weekly - 1 sets - 1 reps - 30 sec hold - Supine Piriformis Stretch with Leg Straight  - 1 x daily - 7 x weekly - 1 sets - 2 reps - 30 sec hold - Supine Hamstring Stretch  - 1 x daily - 7 x weekly - 1 sets - 2 reps - 30 sec hold - Cat Cow  - 1 x daily - 7 x weekly - 1 sets - 10 reps - Child's Pose Stretch  - 1 x daily - 7 x weekly - 3 sets - 10 reps - Child's Pose with Sidebending  - 1 x daily - 7 x weekly - 1 sets - 2 reps - 30 sec hold - Sidelying Reverse Clamshell  - 1 x daily - 7 x weekly - 2 sets - 5 reps - Supine Diaphragmatic Breathing  - 1 x daily - 7 x weekly - 1 sets - 10 reps      ASSESSMENT:   CLINICAL IMPRESSION: Patient is a 41  y.o. female  who was seen today for physical therapy  treatment for vaginal dryness and painful intercourse. Patient is using the largest dilator and it is getting in further and further. Patient has reduction of lower abdominal tightness.  Patient has tightness along the perineal body, transverse perineum. Patient will benefit from skilled therapy to improve pelvic floor elongation so she is able to have vaginal penetration vaginally.      OBJECTIVE IMPAIRMENTS decreased activity tolerance, decreased coordination, decreased strength, increased fascial restrictions, increased muscle spasms, impaired tone, and pain.    ACTIVITY LIMITATIONS  vaginal penetration    PARTICIPATION LIMITATIONS:  interpersonal relationship   PERSONAL FACTORS 3+ comorbidities: right breast cancer 01/23/2020; hysterectomy, c-section x2  are also affecting patient's functional outcome.    REHAB POTENTIAL: Excellent   CLINICAL DECISION MAKING: Stable/uncomplicated   EVALUATION COMPLEXITY: Low     GOALS: Goals reviewed with patient? Yes   SHORT TERM GOALS: Target date: 06/28/2021   Patient educated on vaginal moisturizers and lubricants to improve vaginal health.  Baseline: Goal status: met 06/14/2021   2.  Patient educated on perineal massage to assist with vaginal elongation.  Baseline:  Goal status: met 06/14/2021   3.  Patient educated on diaphragmatic breathing to lengthen the pelvic floor.  Baseline:  Goal status: Met 07/07/2021     LONG TERM GOALS: Target date: 08/23/2021    Patient independent with advanced HEP for pelvic floor lengthening.  Baseline:  Goal status: ongoing 08/11/2021   2.  Patient is able to have penile penetration vaginally with 0-1/10 pain level and Marinoff score is 1/3.  Baseline:  3/10  with pressure Goal status: ongoing 08/11/2021   3.  Pelvic floor strength is 3/5 with full relaxation after contraction and ability to hug the  therapist finger due to improved lengthening of the muscle.  Baseline:  Goal status: ongoing 08/11/2021   4.  Vaginal dryness improved >= 50% due to using vaginal moisturizers to improve tissue health.  Baseline:  Goal status: met 06/28/2021   PLAN: PT FREQUENCY: 1x/week   PT DURATION: 12 weeks   PLANNED INTERVENTIONS: Therapeutic exercises, Therapeutic activity, Neuromuscular re-education, Patient/Family education, Joint mobilization, Dry Needling, Electrical stimulation, and Manual therapy   PLAN FOR NEXT SESSION: manual work to release outside the vaginal area then work internally [employer], PT 10/11/21 10:17 AM

## 2021-10-18 ENCOUNTER — Ambulatory Visit: Payer: 59

## 2021-10-18 VITALS — Wt 121.4 lb

## 2021-10-18 DIAGNOSIS — Z483 Aftercare following surgery for neoplasm: Secondary | ICD-10-CM

## 2021-10-18 NOTE — Therapy (Signed)
OUTPATIENT PHYSICAL THERAPY SOZO SCREENING NOTE   Patient Name: Sydney Herman MRN: [id number] DOB:1980-02-27, 41 y.o., female Today's Date: 10/18/2021  PCP: Luetta Nutting, DO REFERRING PROVIDER: Nicholas Lose, MD   PT End of Session - 10/18/21 0915     Visit Number 12   # unchanged due to screen only   PT Start Time 0912    PT Stop Time 0916    PT Time Calculation (min) 4 min    Activity Tolerance Patient tolerated treatment well    Behavior During Therapy [facility] for tasks assessed/performed             Past Medical History:  Diagnosis Date   Abdominal wall mass of right lower quadrant 05/17/2013   Saw Dr. Georgette Dover 2015- was thought to be scar tissue- no growth since that time    Breast cancer [facility]) 01/23/2020   Right   Chicken pox    Family history of esophageal cancer    Family history of prostate cancer    Family history of uterine cancer    Generalized headaches    Has had some migraines in past as well. once a week or less. Usually occipital and associated with sinus pressure as well.    Heartburn in pregnancy    Past Surgical History:  Procedure Laterality Date   BREAST RECONSTRUCTION WITH PLACEMENT OF TISSUE EXPANDER AND ALLODERM Bilateral 09/09/2020   Procedure: BILATERAL BREAST RECONSTRUCTION WITH PLACEMENT OF TISSUE EXPANDER AND ALLODERM;  Surgeon: Irene Limbo, MD;  Location: [place];  Service: Plastics;  Laterality: Bilateral;   CESAREAN SECTION  07/11/2010   [facility]   CESAREAN SECTION N/A 12/27/2012   womens 2nd    NIPPLE SPARING MASTECTOMY Left 09/09/2020   Procedure: LEFT NIPPLE SPARING MASTECTOMY;  Surgeon: Rolm Bookbinder, MD;  Location: [place];  Service: General;  Laterality: Left;   NIPPLE SPARING MASTECTOMY WITH SENTINEL LYMPH NODE BIOPSY Right 09/09/2020   Procedure: RIGHT NIPPLE SPARING MASTECTOMY WITH RIGHT AXILLARY SENTINEL LYMPH NODE BIOPSY;  Surgeon: Rolm Bookbinder, MD;  Location: [place];  Service: General;  Laterality: Right;   PORTACATH PLACEMENT Right 02/04/2020   Procedure: INSERTION PORT-A-CATH WITH ULTRASOUND;  Surgeon: Rolm Bookbinder, MD;  Location: [place];  Service: General;  Laterality: Right;   REMOVAL OF BILATERAL TISSUE EXPANDERS WITH PLACEMENT OF BILATERAL BREAST IMPLANTS Bilateral 12/15/2020   Procedure: REMOVAL OF BILATERAL TISSUE EXPANDERS WITH PLACEMENT OF BILATERAL BREAST SILICONE IMPLANTS;  Surgeon: Irene Limbo, MD;  Location: [place];  Service: Plastics;  Laterality: Bilateral;   TOTAL ABDOMINAL HYSTERECTOMY N/A    WISDOM TOOTH EXTRACTION     Patient Active Problem List   Diagnosis Date Noted   Well adult exam 04/27/2021   Adjustment reaction with anxiety 04/27/2021   Malignant tumor of breast ([place]) 05/12/2020   Port-A-Cath in place 03/19/2020   BRCA2 gene mutation positive 02/21/2020   Family history of prostate cancer    Family history of uterine cancer    Family history of esophageal cancer    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 She underwent neoadjuvant chemotherapy 02/05/2020 - 08/18/2020 followed by a bilateral mastectomy and right sentinel node biopsy (3 negative nodes) on 09/09/2020. The mass measures 2 cm and is located in the upper outer quadrant with a Ki67 of 10%.   PRECAUTIONS:  right UE Lymphedema risk, None  SUBJECTIVE: Pt returns for her 3 month L-Dex screen.   PAIN:  Are you having pain? No  SOZO SCREENING: Patient was assessed today using the SOZO machine to determine the lymphedema index score. This was compared to her baseline score. It was determined that she is within the recommended range when compared to her baseline and no further action is needed at this time. She will continue SOZO screenings. These are done every 3 months for 2 years post operatively followed by every 6 months for 2 years, and then annually.   L-DEX FLOWSHEETS - 10/18/21 0900       L-DEX LYMPHEDEMA SCREENING   Measurement Type Unilateral    L-DEX MEASUREMENT EXTREMITY Upper Extremity    POSITION  Standing    DOMINANT SIDE Right    At Risk Side Right    BASELINE SCORE (UNILATERAL) -2.8    L-DEX SCORE (UNILATERAL) -2.7    VALUE CHANGE (UNILAT) 0.1              Sydney Herman, PTA 10/18/2021, 9:16 AM

## 2021-10-25 ENCOUNTER — Ambulatory Visit: Payer: 59 | Admitting: Physical Therapy

## 2021-10-25 ENCOUNTER — Encounter: Payer: Self-pay | Admitting: Physical Therapy

## 2021-10-25 DIAGNOSIS — R252 Cramp and spasm: Secondary | ICD-10-CM | POA: Diagnosis not present

## 2021-10-25 DIAGNOSIS — Z483 Aftercare following surgery for neoplasm: Secondary | ICD-10-CM

## 2021-10-25 NOTE — Therapy (Addendum)
OUTPATIENT PHYSICAL THERAPY TREATMENT NOTE   Patient Name: Sydney Herman MRN: [id number] DOB:January 05, 1981, 41 y.o., female Today's Date: 10/25/2021  PCP:  Luetta Nutting, MD REFERRING PROVIDER: Nicholas Lose, MD  END OF SESSION:   PT End of Session - 10/25/21 0927     Visit Number 13    Date for PT Re-Evaluation 11/14/21    Authorization Type [facility] [employer]    Authorization - Visit Number 13    Authorization - Number of Visits 24    PT Start Time 0930    PT Stop Time 1010    PT Time Calculation (min) 40 min    Activity Tolerance Patient tolerated treatment well    Behavior During Therapy [facility] for tasks assessed/performed             Past Medical History:  Diagnosis Date   Abdominal wall mass of right lower quadrant 05/17/2013   Saw Dr. Georgette Dover 2015- was thought to be scar tissue- no growth since that time    Breast cancer ([place]) 01/23/2020   Right   Chicken pox    Family history of esophageal cancer    Family history of prostate cancer    Family history of uterine cancer    Generalized headaches    Has had some migraines in past as well. once a week or less. Usually occipital and associated with sinus pressure as well.    Heartburn in pregnancy    Past Surgical History:  Procedure Laterality Date   BREAST RECONSTRUCTION WITH PLACEMENT OF TISSUE EXPANDER AND ALLODERM Bilateral 09/09/2020   Procedure: BILATERAL BREAST RECONSTRUCTION WITH PLACEMENT OF TISSUE EXPANDER AND ALLODERM;  Surgeon: Irene Limbo, MD;  Location: [place];  Service: Plastics;  Laterality: Bilateral;   CESAREAN SECTION  07/11/2010   [facility]   CESAREAN SECTION N/A 12/27/2012   womens 2nd    NIPPLE SPARING MASTECTOMY Left 09/09/2020   Procedure: LEFT NIPPLE SPARING MASTECTOMY;  Surgeon: Rolm Bookbinder, MD;  Location: [place];  Service: General;  Laterality: Left;   NIPPLE SPARING MASTECTOMY WITH SENTINEL LYMPH NODE BIOPSY Right 09/09/2020   Procedure: RIGHT  NIPPLE SPARING MASTECTOMY WITH RIGHT AXILLARY SENTINEL LYMPH NODE BIOPSY;  Surgeon: Rolm Bookbinder, MD;  Location: [place];  Service: General;  Laterality: Right;   PORTACATH PLACEMENT Right 02/04/2020   Procedure: INSERTION PORT-A-CATH WITH ULTRASOUND;  Surgeon: Rolm Bookbinder, MD;  Location: [place];  Service: General;  Laterality: Right;   REMOVAL OF BILATERAL TISSUE EXPANDERS WITH PLACEMENT OF BILATERAL BREAST IMPLANTS Bilateral 12/15/2020   Procedure: REMOVAL OF BILATERAL TISSUE EXPANDERS WITH PLACEMENT OF BILATERAL BREAST SILICONE IMPLANTS;  Surgeon: Irene Limbo, MD;  Location: [place];  Service: Plastics;  Laterality: Bilateral;   TOTAL ABDOMINAL HYSTERECTOMY N/A    WISDOM TOOTH EXTRACTION     Patient Active Problem List   Diagnosis Date Noted   Well adult exam 04/27/2021   Adjustment reaction with anxiety 04/27/2021   Malignant tumor of breast ([place]) 05/12/2020   Port-A-Cath in place 03/19/2020   BRCA2 gene mutation positive 02/21/2020   Family history of prostate cancer    Family history of uterine cancer    Family history of esophageal cancer    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   History of abnormal cervical Pap smear 11/16/2018   Family history of type 2 diabetes mellitus in father 11/16/2018   Family history of breast cancer in mother 11/16/2018  Mild hyperlipidemia 11/02/2017   Vitamin B12 deficiency 02/20/2017   LGSIL (low grade squamous intraepithelial lesion) on Pap smear 10/02/2011   REFERRING DIAG: N89.8 Vaginal Dryness   THERAPY DIAG:  Aftercare following surgery for neoplasm   Cramp and spasm   Rationale for Evaluation and Treatment Rehabilitation   PERTINENT HISTORY: Right breast cancer; C-section x2; hysterectomy   PRECAUTIONS: cancer   SUBJECTIVE:I had vaginal intercourse and it felt tight and did not flow easily. I am using the largest for the  width of the introitus and the one below to work on the length of the vaginal canal. Did not feel like it was tearing so pain is better.    PAIN:  Are you having pain? Yes NPRS scale: 3/10 Pain location: Internal   Pain type: tight Pain description:  tearing     Aggravating factors: penile penetration Relieving factors: no penile penetration vaginally   PATIENT GOALS reduce pain and improve dryness   INTERCOURSE Pain with intercourse: Initial Penetration, after intercourse and during Ability to have vaginal penetration:  No Climax: able to climax Marinoff Scale: 1/3   OBJECTIVE: (objective measures completed at initial evaluation unless otherwise dated)   COGNITION:            Overall cognitive status: Within functional limits for tasks assessed                          SENSATION:            Light touch: Appears intact            Proprioception: Appears intact      POSTURE:  good   LUMBARAROM/PROM lumbar ROM is full.      LOWER EXTREMITY ROM: Bilateral hip ROM is full.    LOWER EXTREMITY MMT:   MMT Right eval Left eval Right 07/12/2021 Left  07/12/2021 Right 08/11/2021 Left 08/11/2021 Right and left  10/25/2021  Hip abduction 4/5 4/5 4/5 4/5 4/5 4/5 5/5  Hip adduction 4/5 4/5 5/5 5/5 5/5 5/5 5/5    PELVIC MMT:   MMT eval 08/11/2021 09/27/2021 10/25/2021  Vaginal 2/5 with less contraction on the sides 2/5 with circular contraction 3/5 3/5  (Blank rows = not tested)         PALPATION:   General  Good mobility of the abdominal scars for the hysterectomy. Less restrictions of the c-section scar                                            Internal Pelvic Floor tightness located in the introitus and levator ani   TONE: increased   PROLAPSE: none   TODAY'S TREATMENT  10/25/2021 Manual: Myofascial release: fascial release around the labia  and vulvar area going slowly to elongate the tissue Internal pelvic floor techniques:No emotional/communication barriers or  cognitive limitation. Patient is motivated to learn. Patient understands and agrees with treatment goals and plan. PT explains patient will be examined in standing, sitting, and lying down to see how their muscles and joints work. When they are ready, they will be asked to remove their underwear so PT can examine their perineum. The patient is also given the option of providing their own chaperone as one is not provided in our facility. The patient also has the right and is explained the right to defer or refuse any  part of the evaluation or treatment including the internal exam. With the patient's consent, PT will use one gloved finger to gently assess the muscles of the pelvic floor, seeing how well it contracts and relaxes and if there is muscle symmetry. After, the patient will get dressed and PT and patient will discuss exam findings and plan of care. PT and patient discuss plan of care, schedule, attendance policy and HEP activities.  Manual work around the introitus with opening the introitus going slowly using 2 fingers  Manual work on the levator ani and puborectalis to relax the muscles.  Self-care: Discussed with patient on using the dilators with hip movement and moving the dilator in and out. Using lubricant with intercourse. Looked at the size of the dilators, discusses with patient on husband using the dilator on her prior to penile penetration.     10/11/2021 Manual: Myofascial release:fascial release on the lower abdominal area, tissue rolling of the lower abdomen Internal pelvic floor techniques:No emotional/communication barriers or cognitive limitation. Patient is motivated to learn. Patient understands and agrees with treatment goals and plan. PT explains patient will be examined in standing, sitting, and lying down to see how their muscles and joints work. When they are ready, they will be asked to remove their underwear so PT can examine their perineum. The patient is also given the  option of providing their own chaperone as one is not provided in our facility. The patient also has the right and is explained the right to defer or refuse any part of the evaluation or treatment including the internal exam. With the patient's consent, PT will use one gloved finger to gently assess the muscles of the pelvic floor, seeing how well it contracts and relaxes and if there is muscle symmetry. After, the patient will get dressed and PT and patient will discuss exam findings and plan of care. PT and patient discuss plan of care, schedule, attendance policy and HEP activities.  Manual work around the perineal body, along the transverse perineum, along the sides of the pubic rami.  Exercises: Stretches/mobility:frog stretch moving back and forth,  Pigeon pose going back and forth to each side Stand with legs apart  and rotate trunk going back and forth  Cat cow Side trunk stretch in quadruped    09/27/2021 Manual: Internal pelvic floor techniques:No emotional/communication barriers or cognitive limitation. Patient is motivated to learn. Patient understands and agrees with treatment goals and plan. PT explains patient will be examined in standing, sitting, and lying down to see how their muscles and joints work. When they are ready, they will be asked to remove their underwear so PT can examine their perineum. The patient is also given the option of providing their own chaperone as one is not provided in our facility. The patient also has the right and is explained the right to defer or refuse any part of the evaluation or treatment including the internal exam. With the patient's consent, PT will use one gloved finger to gently assess the muscles of the pelvic floor, seeing how well it contracts and relaxes and if there is muscle symmetry. After, the patient will get dressed and PT and patient will discuss exam findings and plan of care. PT and patient discuss plan of care, schedule, attendance  policy and HEP activities.  Going through the vagina working on the introitus, bulbocavernosus and urogenital diaphragm One finger externally on lower abdomen and other internally working on the anterior vaginal wall releasing the fascial restrictions  to elongate the vaginal canal.  Neuromuscular re-education: Pelvic floor contraction training:pelvic floor contraction with full relaxation with internal cues Exercises: Stretches/mobility:frog stretch moving back and forth,  hip rotation going to side to side Pigeon pose going back and forth to each side Stand with legs apart  and rotate trunk going back and forth  Self-care: Educated patient on using the next dilator to increase the vaginal introitus and use the smaller dilator to work on length        PATIENT EDUCATION: 10/25/2021 Education details: education on using vaginal dilators and how to progress using water based lubricant Person educated: Patient Education method: Explanation, Demonstration, Tactile cues, and Verbal cues Education comprehension: verbalized understanding, returned demonstration, verbal cues required, tactile cues required, and needs further education       HOME EXERCISE PROGRAM: 10/162023 Access Code: [email or web address] URL: [email or web address] Date: 06/14/2021 Prepared by: Earlie Counts   Exercises - Happy Baby with Pelvic Floor Lengthening  - 1 x daily - 7 x weekly - 1 sets - 1 reps - 30 sec hold - Supine Piriformis Stretch with Leg Straight  - 1 x daily - 7 x weekly - 1 sets - 2 reps - 30 sec hold - Supine Hamstring Stretch  - 1 x daily - 7 x weekly - 1 sets - 2 reps - 30 sec hold - Cat Cow  - 1 x daily - 7 x weekly - 1 sets - 10 reps - Child's Pose Stretch  - 1 x daily - 7 x weekly - 3 sets - 10 reps - Child's Pose with Sidebending  - 1 x daily - 7 x weekly - 1 sets - 2 reps - 30 sec hold - Sidelying Reverse Clamshell  - 1 x daily - 7 x weekly - 2 sets - 5 reps - Supine Diaphragmatic Breathing   - 1 x daily - 7 x weekly - 1 sets - 10 reps     ASSESSMENT:   CLINICAL IMPRESSION: Patient is a 41  y.o. female  who was seen today for physical therapy  treatment for vaginal dryness and painful intercourse. Patient is using the largest dilator and it is getting in further and further. Therapist was able to place 3 finger into the introitus due to the tissue expanded after the manual work. Patient was educated on how to progress her dilators and use them with movement to reduce the tightness. Patient is using the dilator that is smaller than the largest to elongate the vaginal canal. Pain level with penile penetration 3/10. She has full strength of her hips. She has less restrictions of the c-section scar. Patient will benefit from skilled therapy to improve pelvic floor elongation so she is able to have vaginal penetration vaginally.      OBJECTIVE IMPAIRMENTS decreased activity tolerance, decreased coordination, decreased strength, increased fascial restrictions, increased muscle spasms, impaired tone, and pain.    ACTIVITY LIMITATIONS  vaginal penetration    PARTICIPATION LIMITATIONS:  interpersonal relationship   PERSONAL FACTORS 3+ comorbidities: right breast cancer 01/23/2020; hysterectomy, c-section x2  are also affecting patient's functional outcome.    REHAB POTENTIAL: Excellent   CLINICAL DECISION MAKING: Stable/uncomplicated   EVALUATION COMPLEXITY: Low     GOALS: Goals reviewed with patient? Yes   SHORT TERM GOALS: Target date: 06/28/2021   Patient educated on vaginal moisturizers and lubricants to improve vaginal health.  Baseline: Goal status: met 06/14/2021   2.  Patient educated on perineal massage to assist with vaginal elongation.  Baseline:  Goal status: met 06/14/2021   3.  Patient educated on diaphragmatic breathing to lengthen the pelvic floor.  Baseline:  Goal status: Met 07/07/2021     LONG TERM GOALS: Target date: 08/23/2021    Patient independent with  advanced HEP for pelvic floor lengthening.  Baseline:  Goal status: ongoing 08/11/2021   2.  Patient is able to have penile penetration vaginally with 0-1/10 pain level and Marinoff score is 1/3.  Baseline: 3/10  with pressure Goal status: ongoing 08/11/2021   3.  Pelvic floor strength is 3/5 with full relaxation after contraction and ability to hug the therapist finger due to improved lengthening of the muscle.  Baseline:  Goal status: ongoing 08/11/2021   4.  Vaginal dryness improved >= 50% due to using vaginal moisturizers to improve tissue health.  Baseline:  Goal status: met 06/28/2021   PLAN: PT FREQUENCY: 1x/week   PT DURATION: 12 weeks   PLANNED INTERVENTIONS: Therapeutic exercises, Therapeutic activity, Neuromuscular re-education, Patient/Family education, Joint mobilization, Dry Needling, Electrical stimulation, and Manual therapy   PLAN FOR NEXT SESSION: manual work to release outside the vaginal area then work internally, see how it is going with the dilators.   Earlie Counts, PT 10/25/21 9:28 AM  PHYSICAL THERAPY DISCHARGE SUMMARY  Visits from Start of Care: 13  Current functional level related to goals / functional outcomes: See above.  Patient called on 12/07/2021 to report she wants to be discharged from pelvic floor physical therapy.   Remaining deficits: See above.    Education / Equipment: HEP   Patient agrees to discharge. Patient goals were partially met. Patient is being discharged due to the patient's request. Thank you for the referral. Earlie Counts, PT 12/07/21 2:16 PM '

## 2021-11-03 ENCOUNTER — Other Ambulatory Visit (HOSPITAL_COMMUNITY): Payer: Self-pay

## 2021-11-11 DIAGNOSIS — C50411 Malignant neoplasm of upper-outer quadrant of right female breast: Secondary | ICD-10-CM | POA: Diagnosis not present

## 2021-11-11 DIAGNOSIS — Z9012 Acquired absence of left breast and nipple: Secondary | ICD-10-CM | POA: Diagnosis not present

## 2021-11-23 DIAGNOSIS — C50419 Malignant neoplasm of upper-outer quadrant of unspecified female breast: Secondary | ICD-10-CM | POA: Diagnosis not present

## 2021-11-29 ENCOUNTER — Encounter: Payer: 59 | Admitting: Physical Therapy

## 2021-12-08 ENCOUNTER — Telehealth: Payer: Self-pay

## 2021-12-08 NOTE — Telephone Encounter (Signed)
Called pt per MD to advise [employer] testing was negative/not detected. Pt verbalized understanding of results and knows [employer] will be in touch to schedule 3 mo repeat lab.   

## 2021-12-08 NOTE — Telephone Encounter (Addendum)
Open in error

## 2021-12-08 NOTE — Telephone Encounter (Signed)
Attempted to call pt per md regarding negative signatera results. Lvm for pt to return call back for results.

## 2021-12-09 ENCOUNTER — Encounter: Payer: Self-pay | Admitting: Hematology and Oncology

## 2021-12-13 ENCOUNTER — Encounter: Payer: 59 | Admitting: Physical Therapy

## 2021-12-23 ENCOUNTER — Encounter: Payer: Self-pay | Admitting: Hematology and Oncology

## 2021-12-25 ENCOUNTER — Encounter: Payer: Self-pay | Admitting: Hematology and Oncology

## 2022-01-09 DIAGNOSIS — Z760 Encounter for issue of repeat prescription: Secondary | ICD-10-CM | POA: Diagnosis not present

## 2022-01-31 ENCOUNTER — Other Ambulatory Visit (HOSPITAL_COMMUNITY): Payer: Self-pay

## 2022-01-31 ENCOUNTER — Ambulatory Visit: Payer: Commercial Managed Care - PPO | Attending: Hematology and Oncology

## 2022-01-31 ENCOUNTER — Other Ambulatory Visit: Payer: Self-pay

## 2022-01-31 ENCOUNTER — Other Ambulatory Visit: Payer: Self-pay | Admitting: Adult Health

## 2022-01-31 VITALS — Wt 123.4 lb

## 2022-01-31 DIAGNOSIS — C50411 Malignant neoplasm of upper-outer quadrant of right female breast: Secondary | ICD-10-CM

## 2022-01-31 DIAGNOSIS — Z483 Aftercare following surgery for neoplasm: Secondary | ICD-10-CM | POA: Insufficient documentation

## 2022-01-31 MED ORDER — LETROZOLE 2.5 MG PO TABS
2.5000 mg | ORAL_TABLET | Freq: Every day | ORAL | 3 refills | Status: DC
  Filled 2022-01-31: qty 90, 90d supply, fill #0
  Filled 2022-05-04: qty 90, 90d supply, fill #1

## 2022-01-31 NOTE — Therapy (Signed)
OUTPATIENT PHYSICAL THERAPY SOZO SCREENING NOTE   Patient Name: Sydney Herman MRN: [id number] DOB:1980-10-24, 42 y.o., female Today's Date: 01/31/2022  PCP: Luetta Nutting, DO REFERRING PROVIDER: Nicholas Lose, MD   PT End of Session - 01/31/22 [phone]     Visit Number 12   # unchanged due to screen only   PT Start Time 0940    PT Stop Time 0945    PT Time Calculation (min) 5 min    Activity Tolerance Patient tolerated treatment well    Behavior During Therapy [facility] for tasks assessed/performed             Past Medical History:  Diagnosis Date   Abdominal wall mass of right lower quadrant 05/17/2013   Saw Dr. Georgette Dover 2015- was thought to be scar tissue- no growth since that time    Breast cancer ([place]) 01/23/2020   Right   Chicken pox    Family history of esophageal cancer    Family history of prostate cancer    Family history of uterine cancer    Generalized headaches    Has had some migraines in past as well. once a week or less. Usually occipital and associated with sinus pressure as well.    Heartburn in pregnancy    Past Surgical History:  Procedure Laterality Date   BREAST RECONSTRUCTION WITH PLACEMENT OF TISSUE EXPANDER AND ALLODERM Bilateral 09/09/2020   Procedure: BILATERAL BREAST RECONSTRUCTION WITH PLACEMENT OF TISSUE EXPANDER AND ALLODERM;  Surgeon: Irene Limbo, MD;  Location: [place];  Service: Plastics;  Laterality: Bilateral;   CESAREAN SECTION  07/11/2010   [facility]   CESAREAN SECTION N/A 12/27/2012   womens 2nd    NIPPLE SPARING MASTECTOMY Left 09/09/2020   Procedure: LEFT NIPPLE SPARING MASTECTOMY;  Surgeon: Rolm Bookbinder, MD;  Location: [place];  Service: General;  Laterality: Left;   NIPPLE SPARING MASTECTOMY WITH SENTINEL LYMPH NODE BIOPSY Right 09/09/2020   Procedure: RIGHT NIPPLE SPARING MASTECTOMY WITH RIGHT AXILLARY SENTINEL LYMPH NODE BIOPSY;  Surgeon: Rolm Bookbinder, MD;  Location: [place];  Service: General;  Laterality: Right;   PORTACATH PLACEMENT Right 02/04/2020   Procedure: INSERTION PORT-A-CATH WITH ULTRASOUND;  Surgeon: Rolm Bookbinder, MD;  Location: [place];  Service: General;  Laterality: Right;   REMOVAL OF BILATERAL TISSUE EXPANDERS WITH PLACEMENT OF BILATERAL BREAST IMPLANTS Bilateral 12/15/2020   Procedure: REMOVAL OF BILATERAL TISSUE EXPANDERS WITH PLACEMENT OF BILATERAL BREAST SILICONE IMPLANTS;  Surgeon: Irene Limbo, MD;  Location: [place];  Service: Plastics;  Laterality: Bilateral;   TOTAL ABDOMINAL HYSTERECTOMY N/A    WISDOM TOOTH EXTRACTION     Patient Active Problem List   Diagnosis Date Noted   Well adult exam 04/27/2021   Adjustment reaction with anxiety 04/27/2021   Malignant tumor of breast ([place]) 05/12/2020   Port-A-Cath in place 03/19/2020   BRCA2 gene mutation positive 02/21/2020   Family history of prostate cancer    Family history of uterine cancer    Family history of esophageal cancer    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 She underwent neoadjuvant chemotherapy 02/05/2020 - 08/18/2020 followed by a bilateral mastectomy and right sentinel node biopsy (3 negative nodes) on 09/09/2020. The mass measures 2 cm and is located in the upper outer quadrant with a Ki67 of 10%.   PRECAUTIONS:  right UE Lymphedema risk, None  SUBJECTIVE: Pt returns for her 3 month L-Dex screen.   PAIN:  Are you having pain? No  SOZO SCREENING: Patient was assessed today using the SOZO machine to determine the lymphedema index score. This was compared to her baseline score. It was determined that she is within the recommended range when compared to her baseline and no further action is needed at this time. She will continue SOZO screenings. These are done every 3 months for 2 years post operatively followed by every 6 months for 2 years, and then annually.   L-DEX FLOWSHEETS - 01/31/22 0900       L-DEX LYMPHEDEMA SCREENING   Measurement Type Unilateral    L-DEX MEASUREMENT EXTREMITY Upper Extremity    POSITION  Standing    DOMINANT SIDE Right    At Risk Side Right    BASELINE SCORE (UNILATERAL) -2.8    L-DEX SCORE (UNILATERAL) -2.6    VALUE CHANGE (UNILAT) 0.2              Otelia Limes, PTA 01/31/2022, 9:44 AM

## 2022-02-01 ENCOUNTER — Other Ambulatory Visit: Payer: Self-pay | Admitting: *Deleted

## 2022-02-01 DIAGNOSIS — C50411 Malignant neoplasm of upper-outer quadrant of right female breast: Secondary | ICD-10-CM

## 2022-02-01 NOTE — Progress Notes (Signed)
Per MD request, [employer] renewal order placed.

## 2022-03-05 LAB — SIGNATERA ONLY (NATERA MANAGED)

## 2022-03-15 DIAGNOSIS — C50419 Malignant neoplasm of upper-outer quadrant of unspecified female breast: Secondary | ICD-10-CM | POA: Diagnosis not present

## 2022-03-22 ENCOUNTER — Encounter: Payer: Commercial Managed Care - PPO | Admitting: Family Medicine

## 2022-03-23 ENCOUNTER — Telehealth: Payer: Self-pay | Admitting: *Deleted

## 2022-03-23 NOTE — Telephone Encounter (Signed)
Per MD request, RN placed call to pt regarding negative (Not Detected) recent [employer] testing.  Pt appreciative of call and verbalized understanding.  

## 2022-03-24 ENCOUNTER — Encounter: Payer: Self-pay | Admitting: Hematology and Oncology

## 2022-03-29 ENCOUNTER — Encounter: Payer: Commercial Managed Care - PPO | Admitting: Family Medicine

## 2022-05-02 ENCOUNTER — Ambulatory Visit: Payer: Commercial Managed Care - PPO | Attending: Hematology and Oncology

## 2022-05-02 VITALS — Wt 124.4 lb

## 2022-05-02 DIAGNOSIS — Z483 Aftercare following surgery for neoplasm: Secondary | ICD-10-CM | POA: Insufficient documentation

## 2022-05-02 NOTE — Therapy (Signed)
OUTPATIENT PHYSICAL THERAPY SOZO SCREENING NOTE   Patient Name: Sydney Herman MRN: [id number] DOB:07/09/1980, 42 y.o., female Today's Date: 05/02/2022  PCP: Everrett Coombe, DO REFERRING PROVIDER: Serena Croissant, MD   PT End of Session - 05/02/22 0940     Visit Number 12   # unchanged due to screen only   PT Start Time 0938    PT Stop Time 0945    PT Time Calculation (min) 7 min    Activity Tolerance Patient tolerated treatment well    Behavior During Therapy [facility] for tasks assessed/performed             Past Medical History:  Diagnosis Date   Abdominal wall mass of right lower quadrant 05/17/2013   Saw Dr. Corliss Skains 2015- was thought to be scar tissue- no growth since that time    Breast cancer [facility]) 01/23/2020   Right   Chicken pox    Family history of esophageal cancer    Family history of prostate cancer    Family history of uterine cancer    Generalized headaches    Has had some migraines in past as well. once a week or less. Usually occipital and associated with sinus pressure as well.    Heartburn in pregnancy    Past Surgical History:  Procedure Laterality Date   BREAST RECONSTRUCTION WITH PLACEMENT OF TISSUE EXPANDER AND ALLODERM Bilateral 09/09/2020   Procedure: BILATERAL BREAST RECONSTRUCTION WITH PLACEMENT OF TISSUE EXPANDER AND ALLODERM;  Surgeon: Glenna Fellows, MD;  Location: [facility];  Service: Plastics;  Laterality: Bilateral;   CESAREAN SECTION  07/11/2010   [facility]   CESAREAN SECTION N/A 12/27/2012   womens 2nd    NIPPLE SPARING MASTECTOMY Left 09/09/2020   Procedure: LEFT NIPPLE SPARING MASTECTOMY;  Surgeon: Emelia Loron, MD;  Location: [facility];  Service: General;  Laterality: Left;   NIPPLE SPARING MASTECTOMY WITH SENTINEL LYMPH NODE BIOPSY Right 09/09/2020   Procedure: RIGHT NIPPLE SPARING MASTECTOMY WITH RIGHT AXILLARY SENTINEL LYMPH NODE BIOPSY;  Surgeon: Emelia Loron, MD;  Location: [facility];  Service: General;  Laterality: Right;   PORTACATH PLACEMENT Right 02/04/2020   Procedure: INSERTION PORT-A-CATH WITH ULTRASOUND;  Surgeon: Emelia Loron, MD;  Location: [facility];  Service: General;  Laterality: Right;   REMOVAL OF BILATERAL TISSUE EXPANDERS WITH PLACEMENT OF BILATERAL BREAST IMPLANTS Bilateral 12/15/2020   Procedure: REMOVAL OF BILATERAL TISSUE EXPANDERS WITH PLACEMENT OF BILATERAL BREAST SILICONE IMPLANTS;  Surgeon: Glenna Fellows, MD;  Location: [facility];  Service: Plastics;  Laterality: Bilateral;   TOTAL ABDOMINAL HYSTERECTOMY N/A    WISDOM TOOTH EXTRACTION     Patient Active Problem List   Diagnosis Date Noted   Well adult exam 04/27/2021   Adjustment reaction with anxiety 04/27/2021   Malignant tumor of breast 05/12/2020   Port-A-Cath in place 03/19/2020   BRCA2 gene mutation positive 02/21/2020   Family history of prostate cancer    Family history of uterine cancer    Family history of esophageal cancer    Malignant neoplasm of upper outer quadrant of female breast 01/28/2020   History of abnormal cervical Pap smear 11/16/2018   Family history of type 2 diabetes mellitus in father 11/16/2018   Family history of breast cancer in mother 11/16/2018   Mild hyperlipidemia 11/02/2017   Vitamin B12 deficiency 02/20/2017   LGSIL (low grade squamous intraepithelial lesion) on Pap smear 10/02/2011    REFERRING DIAG: right breast cancer at risk  for lymphedema  THERAPY DIAG: Aftercare following surgery for neoplasm  PERTINENT HISTORY: Patient was diagnosed on 01/27/2020 with 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 She underwent neoadjuvant chemotherapy 02/05/2020 - 08/18/2020 followed by a bilateral mastectomy and right sentinel node biopsy (3 negative nodes) on 09/09/2020. The mass measures 2 cm and is located in the upper outer quadrant with a Ki67 of 10%.   PRECAUTIONS: right UE  Lymphedema risk, None  SUBJECTIVE: Pt returns for her 3 month L-Dex screen.   PAIN:  Are you having pain? No  SOZO SCREENING: Patient was assessed today using the SOZO machine to determine the lymphedema index score. This was compared to her baseline score. It was determined that she is within the recommended range when compared to her baseline and no further action is needed at this time. She will continue SOZO screenings. These are done every 3 months for 2 years post operatively followed by every 6 months for 2 years, and then annually.  Answered pts questions about wearing compression sleeve while flying for her upcoming trip.   L-DEX FLOWSHEETS - 05/02/22 0900       L-DEX LYMPHEDEMA SCREENING   Measurement Type Unilateral    L-DEX MEASUREMENT EXTREMITY Upper Extremity    POSITION  Standing    DOMINANT SIDE Right    At Risk Side Right    BASELINE SCORE (UNILATERAL) -2.8    L-DEX SCORE (UNILATERAL) -1.1    VALUE CHANGE (UNILAT) 1.7              Hermenia Bers, PTA 05/02/2022, 9:45 AM

## 2022-05-04 ENCOUNTER — Ambulatory Visit (INDEPENDENT_AMBULATORY_CARE_PROVIDER_SITE_OTHER): Payer: Commercial Managed Care - PPO | Admitting: Family Medicine

## 2022-05-04 ENCOUNTER — Other Ambulatory Visit (HOSPITAL_COMMUNITY): Payer: Self-pay

## 2022-05-04 ENCOUNTER — Encounter: Payer: Self-pay | Admitting: Family Medicine

## 2022-05-04 ENCOUNTER — Encounter: Payer: Self-pay | Admitting: Hematology and Oncology

## 2022-05-04 VITALS — BP 111/74 | HR 55 | Ht 61.0 in | Wt 124.4 lb

## 2022-05-04 DIAGNOSIS — E785 Hyperlipidemia, unspecified: Secondary | ICD-10-CM | POA: Diagnosis not present

## 2022-05-04 DIAGNOSIS — Z23 Encounter for immunization: Secondary | ICD-10-CM | POA: Diagnosis not present

## 2022-05-04 DIAGNOSIS — F4322 Adjustment disorder with anxiety: Secondary | ICD-10-CM | POA: Diagnosis not present

## 2022-05-04 DIAGNOSIS — Z0000 Encounter for general adult medical examination without abnormal findings: Secondary | ICD-10-CM

## 2022-05-04 DIAGNOSIS — M791 Myalgia, unspecified site: Secondary | ICD-10-CM | POA: Diagnosis not present

## 2022-05-04 DIAGNOSIS — Z13220 Encounter for screening for lipoid disorders: Secondary | ICD-10-CM

## 2022-05-04 DIAGNOSIS — E538 Deficiency of other specified B group vitamins: Secondary | ICD-10-CM

## 2022-05-04 DIAGNOSIS — Z833 Family history of diabetes mellitus: Secondary | ICD-10-CM

## 2022-05-04 MED ORDER — ESCITALOPRAM OXALATE 10 MG PO TABS
10.0000 mg | ORAL_TABLET | Freq: Every day | ORAL | 3 refills | Status: DC
  Filled 2022-05-04: qty 90, 90d supply, fill #0
  Filled 2022-08-08: qty 90, 90d supply, fill #1
  Filled 2022-10-23: qty 90, 90d supply, fill #2
  Filled 2023-02-09: qty 90, 90d supply, fill #3

## 2022-05-04 NOTE — Patient Instructions (Signed)
Preventive Care 41-41 Years Old, Female Preventive care refers to lifestyle choices and visits with your health care provider that can promote health and wellness. Preventive care visits are also called wellness exams. What can I expect for my preventive care visit? Counseling Your health care provider may ask you questions about your: Medical history, including: Past medical problems. Family medical history. Pregnancy history. Current health, including: Menstrual cycle. Method of birth control. Emotional well-being. Home life and relationship well-being. Sexual activity and sexual health. Lifestyle, including: Alcohol, nicotine or tobacco, and drug use. Access to firearms. Diet, exercise, and sleep habits. Work and work environment. Sunscreen use. Safety issues such as seatbelt and bike helmet use. Physical exam Your health care provider will check your: Height and weight. These may be used to calculate your BMI (body mass index). BMI is a measurement that tells if you are at a healthy weight. Waist circumference. This measures the distance around your waistline. This measurement also tells if you are at a healthy weight and may help predict your risk of certain diseases, such as type 2 diabetes and high blood pressure. Heart rate and blood pressure. Body temperature. Skin for abnormal spots. What immunizations do I need?  Vaccines are usually given at various ages, according to a schedule. Your health care provider will recommend vaccines for you based on your age, medical history, and lifestyle or other factors, such as travel or where you work. What tests do I need? Screening Your health care provider may recommend screening tests for certain conditions. This may include: Lipid and cholesterol levels. Diabetes screening. This is done by checking your blood sugar (glucose) after you have not eaten for a while (fasting). Pelvic exam and Pap test. Hepatitis B test. Hepatitis C  test. HIV (human immunodeficiency virus) test. STI (sexually transmitted infection) testing, if you are at risk. Lung cancer screening. Colorectal cancer screening. Mammogram. Talk with your health care provider about when you should start having regular mammograms. This may depend on whether you have a family history of breast cancer. BRCA-related cancer screening. This may be done if you have a family history of breast, ovarian, tubal, or peritoneal cancers. Bone density scan. This is done to screen for osteoporosis. Talk with your health care provider about your test results, treatment options, and if necessary, the need for more tests. Follow these instructions at home: Eating and drinking  Eat a diet that includes fresh fruits and vegetables, whole grains, lean protein, and low-fat dairy products. Take vitamin and mineral supplements as recommended by your health care provider. Do not drink alcohol if: Your health care provider tells you not to drink. You are pregnant, may be pregnant, or are planning to become pregnant. If you drink alcohol: Limit how much you have to 0-1 drink a day. Know how much alcohol is in your drink. In the [place] one drink equals one 12 oz bottle of beer (355 mL), one 5 oz glass of wine (148 mL), or one 1 oz glass of hard liquor (44 mL). Lifestyle Brush your teeth every morning and night with fluoride toothpaste. Floss one time each day. Exercise for at least 30 minutes 5 or more days each week. Do not use any products that contain nicotine or tobacco. These products include cigarettes, chewing tobacco, and vaping devices, such as e-cigarettes. If you need help quitting, ask your health care provider. Do not use drugs. If you are sexually active, practice safe sex. Use a condom or other form of protection to   prevent STIs. If you do not wish to become pregnant, use a form of birth control. If you plan to become pregnant, see your health care provider for a  prepregnancy visit. Take aspirin only as told by your health care provider. Make sure that you understand how much to take and what form to take. Work with your health care provider to find out whether it is safe and beneficial for you to take aspirin daily. Find healthy ways to manage stress, such as: Meditation, yoga, or listening to music. Journaling. Talking to a trusted person. Spending time with friends and family. Minimize exposure to UV radiation to reduce your risk of skin cancer. Safety Always wear your seat belt while driving or riding in a vehicle. Do not drive: If you have been drinking alcohol. Do not ride with someone who has been drinking. When you are tired or distracted. While texting. If you have been using any mind-altering substances or drugs. Wear a helmet and other protective equipment during sports activities. If you have firearms in your house, make sure you follow all gun safety procedures. Seek help if you have been physically or sexually abused. What's next? Visit your health care provider once a year for an annual wellness visit. Ask your health care provider how often you should have your eyes and teeth checked. Stay up to date on all vaccines. This information is not intended to replace advice given to you by your health care provider. Make sure you discuss any questions you have with your health care provider. Document Revised: 06/24/2020 Document Reviewed: 06/24/2020 Elsevier Patient Education  2023 Elsevier Inc.  

## 2022-05-04 NOTE — Progress Notes (Signed)
Sydney Herman - 42 y.o. female MRN [id number]  Date of birth: 10-14-1980  Subjective Chief Complaint  Patient presents with   Annual Exam    HPI Sydney Herman is a 42 y.o. female here today for annual exam.  Doing pretty well.  Having some symptoms related to estrogen withdrawal including joint pain and fatigue.   No significant vasomotor symptoms.  She has upcoming follow up with her oncologist.  She continues to stay pretty active.  She is incorporating resistance training in. She feels that her diet is pretty good.  She is a non-smoker.  She consumes EtOH occasionally.  Admits to some stress but feels that she is managing this ok.  Lexapro is helpful.   Review of Systems  Constitutional:  Negative for chills, fever, malaise/fatigue and weight loss.  HENT:  Negative for congestion, ear pain and sore throat.   Eyes:  Negative for blurred vision, double vision and pain.  Respiratory:  Negative for cough and shortness of breath.   Cardiovascular:  Negative for chest pain and palpitations.  Gastrointestinal:  Negative for abdominal pain, blood in stool, constipation, heartburn and nausea.  Genitourinary:  Negative for dysuria and urgency.  Musculoskeletal:  Negative for joint pain and myalgias.  Neurological:  Negative for dizziness and headaches.  Endo/Heme/Allergies:  Does not bruise/bleed easily.  Psychiatric/Behavioral:  Negative for depression. The patient is not nervous/anxious and does not have insomnia.      Allergies  Allergen Reactions   Amoxil [Amoxicillin] Rash    Past Medical History:  Diagnosis Date   Abdominal wall mass of right lower quadrant 05/17/2013   Saw Dr. Corliss Skains 2015- was thought to be scar tissue- no growth since that time    Breast cancer 01/23/2020   Right   Chicken pox    Family history of esophageal cancer    Family history of prostate cancer    Family history of uterine cancer    Generalized headaches    Has had some migraines in past as well. once  a week or less. Usually occipital and associated with sinus pressure as well.    Heartburn in pregnancy     Past Surgical History:  Procedure Laterality Date   BREAST RECONSTRUCTION WITH PLACEMENT OF TISSUE EXPANDER AND ALLODERM Bilateral 09/09/2020   Procedure: BILATERAL BREAST RECONSTRUCTION WITH PLACEMENT OF TISSUE EXPANDER AND ALLODERM;  Surgeon: Glenna Fellows, MD;  Location: [facility];  Service: Plastics;  Laterality: Bilateral;   CESAREAN SECTION  07/11/2010   [facility]   CESAREAN SECTION N/A 12/27/2012   womens 2nd    NIPPLE SPARING MASTECTOMY Left 09/09/2020   Procedure: LEFT NIPPLE SPARING MASTECTOMY;  Surgeon: Emelia Loron, MD;  Location: [facility];  Service: General;  Laterality: Left;   NIPPLE SPARING MASTECTOMY WITH SENTINEL LYMPH NODE BIOPSY Right 09/09/2020   Procedure: RIGHT NIPPLE SPARING MASTECTOMY WITH RIGHT AXILLARY SENTINEL LYMPH NODE BIOPSY;  Surgeon: Emelia Loron, MD;  Location: [facility];  Service: General;  Laterality: Right;   PORTACATH PLACEMENT Right 02/04/2020   Procedure: INSERTION PORT-A-CATH WITH ULTRASOUND;  Surgeon: Emelia Loron, MD;  Location: [facility];  Service: General;  Laterality: Right;   REMOVAL OF BILATERAL TISSUE EXPANDERS WITH PLACEMENT OF BILATERAL BREAST IMPLANTS Bilateral 12/15/2020   Procedure: REMOVAL OF BILATERAL TISSUE EXPANDERS WITH PLACEMENT OF BILATERAL BREAST SILICONE IMPLANTS;  Surgeon: Glenna Fellows, MD;  Location: [facility];  Service: Plastics;  Laterality: Bilateral;   TOTAL ABDOMINAL HYSTERECTOMY  N/A    WISDOM TOOTH EXTRACTION      Social History   Socioeconomic History   Marital status: Married    Spouse name: Not on file   Number of children: 2   Years of education: Not on file   Highest education level: Not on file  Occupational History   Not on file  Tobacco Use   Smoking status: Never   Smokeless tobacco:  Never  Vaping Use   Vaping Use: Never used  Substance and Sexual Activity   Alcohol use: Not Currently    Alcohol/week: 2.0 - 3.0 standard drinks of alcohol    Types: 2 - 3 Standard drinks or equivalent per week   Drug use: Never   Sexual activity: Yes    Partners: Male    Birth control/protection: Pill, Condom  Other Topics Concern   Not on file  Social History Narrative   Home Situation: lives with husband, 54 yo and near 47 year old (10/2015)   Husband works as Public affairs consultant in radiation oncology      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      Spiritual Beliefs: Christian      Lifestyle: exercising about 30-45 minutes 4-5 days per week; diet healthy      Hobbies: exercise, Clinical cytogeneticist, church      Social Determinants of Health   Financial Resource Strain: Low Risk  (01/29/2020)   Overall Financial Resource Strain (CARDIA)    Difficulty of Paying Living Expenses: Not hard at all  Food Insecurity: No Food Insecurity (01/29/2020)   Hunger Vital Sign    Worried About Running Out of Food in the Last Year: Never true    Ran Out of Food in the Last Year: Never true  Transportation Needs: No Transportation Needs (01/29/2020)   PRAPARE - Administrator, Civil Service (Medical): No    Lack of Transportation (Non-Medical): No  Physical Activity: Not on file  Stress: Not on file  Social Connections: Not on file    Family History  Problem Relation Age of Onset   Hypertension Mother    Breast cancer Mother 32       early 52's   Diabetes Father    Atrial fibrillation Father    Other Father        biopsies on kidney- noncancerous. stated potentially precancerous   Prostate cancer Maternal Grandfather 90   Endometrial cancer Paternal Grandmother 52   Esophageal cancer Paternal Uncle        dx early 85s    Health Maintenance  Topic Date Due   COVID-19 Vaccine (3 - Pfizer risk series) 10/20/2022 (Originally 05/22/2019)   Hepatitis C Screening   05/04/2023 (Originally 09/01/1999)   INFLUENZA VACCINE  08/11/2022   DTaP/Tdap/Td (3 - Td or Tdap) 05/03/2032   HIV Screening  Completed   HPV VACCINES  Aged Out     ----------------------------------------------------------------------------------------------------------------------------------------------------------------------------------------------------------------- Physical Exam BP 111/74 (BP Location: Left Arm, Patient Position: Sitting, Cuff Size: Small)   Pulse (!) 55   Ht 5\' 1"  (1.549 m)   Wt 124 lb 6.4 oz (56.4 kg)   LMP  (LMP Unknown)   SpO2 100%   BMI 23.51 kg/m   Physical Exam Constitutional:      General: She is not in acute distress. HENT:     Head: Normocephalic and atraumatic.     Right Ear: Tympanic membrane and ear canal normal.     Left Ear: Tympanic membrane and ear  canal normal.     Nose: Nose normal.  Eyes:     General: No scleral icterus.    Conjunctiva/sclera: Conjunctivae normal.  Neck:     Thyroid: No thyromegaly.  Cardiovascular:     Rate and Rhythm: Normal rate and regular rhythm.     Heart sounds: Normal heart sounds.  Pulmonary:     Effort: Pulmonary effort is normal.     Breath sounds: Normal breath sounds.  Abdominal:     General: Bowel sounds are normal. There is no distension.     Palpations: Abdomen is soft.     Tenderness: There is no abdominal tenderness. There is no guarding.  Musculoskeletal:        General: Normal range of motion.     Cervical back: Normal range of motion and neck supple.  Lymphadenopathy:     Cervical: No cervical adenopathy.  Skin:    General: Skin is warm and dry.     Findings: No rash.  Neurological:     General: No focal deficit present.     Mental Status: She is alert and oriented to person, place, and time.     Cranial Nerves: No cranial nerve deficit.     Coordination: Coordination normal.  Psychiatric:        Mood and Affect: Mood normal.        Behavior: Behavior normal.      ------------------------------------------------------------------------------------------------------------------------------------------------------------------------------------------------------------------- Assessment and Plan  Well adult exam Well adult Orders Placed This Encounter  Procedures   Tdap vaccine greater than or equal to 7yo IM   COMPLETE METABOLIC PANEL WITH GFR   CBC with Differential   Lipid Panel w/reflex Direct LDL   TSH   Vitamin D (25 hydroxy)   B12  Screenings; per lab orders Immunizations: UTD Anticipatory guidance/Risk factor reduction:  Recommendations per AVS.   Adjustment reaction with anxiety Remains stable with lexapro at current strength.    Meds ordered this encounter  Medications   escitalopram (LEXAPRO) 10 MG tablet    Sig: Take 1 tablet (10 mg total) by mouth daily.    Dispense:  90 tablet    Refill:  3    Replaces 5 mg dose, please cancel 5 mg refills, thanks!    No follow-ups on file.    This visit occurred during the SARS-CoV-2 public health emergency.  Safety protocols were in place, including screening questions prior to the visit, additional usage of staff PPE, and extensive cleaning of exam room while observing appropriate contact time as indicated for disinfecting solutions.

## 2022-05-04 NOTE — Assessment & Plan Note (Signed)
Remains stable with lexapro at current strength.

## 2022-05-04 NOTE — Assessment & Plan Note (Signed)
Well adult Orders Placed This Encounter  Procedures   Tdap vaccine greater than or equal to 41yo IM   COMPLETE METABOLIC PANEL WITH GFR   CBC with Differential   Lipid Panel w/reflex Direct LDL   TSH   Vitamin D (25 hydroxy)   B12  Screenings; per lab orders Immunizations: UTD Anticipatory guidance/Risk factor reduction:  Recommendations per AVS.

## 2022-05-05 LAB — COMPLETE METABOLIC PANEL WITH GFR
AG Ratio: 1.6 (calc) (ref 1.0–2.5)
ALT: 19 U/L (ref 6–29)
AST: 26 U/L (ref 10–30)
Albumin: 5.1 g/dL (ref 3.6–5.1)
Alkaline phosphatase (APISO): 62 U/L (ref 31–125)
BUN: 13 mg/dL (ref 7–25)
CO2: 27 mmol/L (ref 20–32)
Calcium: 10.4 mg/dL — ABNORMAL HIGH (ref 8.6–10.2)
Chloride: 101 mmol/L (ref 98–110)
Creat: 0.69 mg/dL (ref 0.50–0.99)
Globulin: 3.1 g/dL (calc) (ref 1.9–3.7)
Glucose, Bld: 90 mg/dL (ref 65–99)
Potassium: 4.5 mmol/L (ref 3.5–5.3)
Sodium: 139 mmol/L (ref 135–146)
Total Bilirubin: 0.6 mg/dL (ref 0.2–1.2)
Total Protein: 8.2 g/dL — ABNORMAL HIGH (ref 6.1–8.1)
eGFR: 112 mL/min/{1.73_m2} (ref >=60)

## 2022-05-05 LAB — CBC WITH DIFFERENTIAL/PLATELET
Absolute Monocytes: 449 cells/uL (ref 200–950)
Basophils Absolute: 20 cells/uL (ref 0–200)
Basophils Relative: 0.3 %
Eosinophils Absolute: 60 cells/uL (ref 15–500)
Eosinophils Relative: 0.9 %
HCT: 40.3 % (ref 35.0–45.0)
Hemoglobin: 13.6 g/dL (ref 11.7–15.5)
Lymphs Abs: 2137 cells/uL (ref 850–3900)
MCH: 30 pg (ref 27.0–33.0)
MCHC: 33.7 g/dL (ref 32.0–36.0)
MCV: 88.8 fL (ref 80.0–100.0)
MPV: 10 fL (ref 7.5–12.5)
Monocytes Relative: 6.7 %
Neutro Abs: 4033 cells/uL (ref 1500–7800)
Neutrophils Relative %: 60.2 %
Platelets: 277 10*3/uL (ref 140–400)
RBC: 4.54 10*6/uL (ref 3.80–5.10)
RDW: 12.6 % (ref 11.0–15.0)
Total Lymphocyte: 31.9 %
WBC: 6.7 10*3/uL (ref 3.8–10.8)

## 2022-05-05 LAB — LIPID PANEL W/REFLEX DIRECT LDL
Cholesterol: 231 mg/dL — ABNORMAL HIGH (ref <200)
HDL: 119 mg/dL (ref >=50)
LDL Cholesterol (Calc): 97 mg/dL (calc)
Non-HDL Cholesterol (Calc): 112 mg/dL (calc) (ref <130)
Total CHOL/HDL Ratio: 1.9 (calc) (ref <5.0)
Triglycerides: 63 mg/dL (ref <150)

## 2022-05-05 LAB — VITAMIN B12: Vitamin B-12: 450 pg/mL (ref 200–1100)

## 2022-05-05 LAB — VITAMIN D 25 HYDROXY (VIT D DEFICIENCY, FRACTURES): Vit D, 25-Hydroxy: 92 ng/mL (ref 30–100)

## 2022-05-05 LAB — TSH: TSH: 0.5 mIU/L

## 2022-05-08 NOTE — Assessment & Plan Note (Signed)
01/28/2020: Palpable right breast lump: Mammogram and ultrasound revealed calcifications spanning 2 cm, biopsy revealed IDC with DCIS, grade 2, 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   12/15/2020: Patient completed surgery to replace the expanders with the implants.   Treatment plan: 1. Neo- adjuvant chemotherapy with dose dense Adriamycin and Cytoxan/Keytruda followed by Taxol and carboplatin completed 08/18/2020 2. bilateral mastectomies:09/09/2020: Left mastectomy: Negative for cancer, right mastectomy: Pathologic complete response 0/3 lymph nodes negative 3. Adjuvant antiestrogen therapy (she had hysterectomy and BSO) Based on extensive discussion back-and-forth we determined that she does not need adjuvant radiation.  _______________________________________________________________________ Current treatment: Letrozole started March 2023, switched to anastrozole 05/20/2022 Letrozole toxicities: 1.  Joint stiffness and achiness: I recommended switching her from letrozole to anastrozole   Concerns in the breast: Breast examination does not reveal any clear-cut abnormalities to warrant further investigation at this time.   Signatera testing: Neg   Breast cancer surveillance: 1.  Breast exam 05/09/2022: Benign 2. no role of imaging studies since she had bilateral mastectomies   Vaginal dryness: I sent a prescription for Estrace  Telephone visit in 6 weeks to discuss tolerance to anastrozole Return to clinic in 1 year for follow-up

## 2022-05-09 ENCOUNTER — Inpatient Hospital Stay: Payer: Commercial Managed Care - PPO | Attending: Hematology and Oncology | Admitting: Hematology and Oncology

## 2022-05-09 ENCOUNTER — Other Ambulatory Visit (HOSPITAL_COMMUNITY): Payer: Self-pay

## 2022-05-09 ENCOUNTER — Other Ambulatory Visit: Payer: Self-pay

## 2022-05-09 VITALS — BP 112/69 | HR 66 | Temp 97.7°F | Resp 18 | Ht 61.0 in | Wt 125.1 lb

## 2022-05-09 DIAGNOSIS — Z880 Allergy status to penicillin: Secondary | ICD-10-CM | POA: Insufficient documentation

## 2022-05-09 DIAGNOSIS — R5383 Other fatigue: Secondary | ICD-10-CM | POA: Insufficient documentation

## 2022-05-09 DIAGNOSIS — M2550 Pain in unspecified joint: Secondary | ICD-10-CM | POA: Diagnosis not present

## 2022-05-09 DIAGNOSIS — C50411 Malignant neoplasm of upper-outer quadrant of right female breast: Secondary | ICD-10-CM | POA: Insufficient documentation

## 2022-05-09 DIAGNOSIS — Z170 Estrogen receptor positive status [ER+]: Secondary | ICD-10-CM | POA: Insufficient documentation

## 2022-05-09 DIAGNOSIS — Z79899 Other long term (current) drug therapy: Secondary | ICD-10-CM | POA: Insufficient documentation

## 2022-05-09 DIAGNOSIS — Z79811 Long term (current) use of aromatase inhibitors: Secondary | ICD-10-CM | POA: Diagnosis not present

## 2022-05-09 DIAGNOSIS — M2560 Stiffness of unspecified joint, not elsewhere classified: Secondary | ICD-10-CM | POA: Diagnosis not present

## 2022-05-09 MED ORDER — ESTRADIOL 0.1 MG/GM VA CREA
1.0000 | TOPICAL_CREAM | Freq: Every day | VAGINAL | 12 refills | Status: DC
  Filled 2022-05-09: qty 42.5, 30d supply, fill #0
  Filled 2022-06-07: qty 42.5, 30d supply, fill #1
  Filled 2022-07-12: qty 42.5, 30d supply, fill #2
  Filled 2022-08-08: qty 42.5, 30d supply, fill #3
  Filled 2022-09-27: qty 42.5, 30d supply, fill #4
  Filled 2022-10-23: qty 42.5, 30d supply, fill #5
  Filled 2022-12-30: qty 42.5, 30d supply, fill #6
  Filled 2023-02-09: qty 42.5, 30d supply, fill #7
  Filled 2023-05-01: qty 42.5, 30d supply, fill #8

## 2022-05-09 MED ORDER — ANASTROZOLE 1 MG PO TABS
1.0000 mg | ORAL_TABLET | Freq: Every day | ORAL | 3 refills | Status: DC
  Filled 2022-05-09: qty 90, 90d supply, fill #0

## 2022-05-09 NOTE — Progress Notes (Signed)
Patient Care Team: Everrett Coombe, DO as PCP - General (Family Medicine) Zelphia Cairo, MD as Consulting Physician (Obstetrics and Gynecology) Serena Croissant, MD as Consulting Physician (Hematology and Oncology) Glenna Fellows, MD as Consulting Physician (Plastic Surgery) Emelia Loron, MD as Consulting Physician (General Surgery) Skinner, Roselie Awkward, MD as Consulting Physician (Obstetrics and Gynecology)  DIAGNOSIS:  Encounter Diagnosis  Name Primary?   Malignant neoplasm of upper-outer quadrant of right breast in female, estrogen receptor positive (HCC) Yes    SUMMARY OF ONCOLOGIC HISTORY: Oncology History  Malignant neoplasm of upper outer quadrant of female breast (HCC)  01/28/2020 Initial Diagnosis   Patient palpated a right breast lump. Diagnostic mammogram and US showed calcifications spanning 2.0cm in the right breast. Biopsy showed invasive and in situ carcinoma, grade 2. ER 5% weak, PR 0% negative, HER2 equiv, Ki 10%     01/28/2020 Cancer Staging   Staging form: Breast, AJCC 8th Edition - Clinical stage from 01/28/2020: Stage IIA (cT2, cN0, cM0, G2, ER+, PR-, HER2-) - Signed by Ronny Bacon, PA-C on 10/07/2020 Stage prefix: Initial diagnosis Method of lymph node assessment: Clinical   02/05/2020 - 08/18/2020 Chemotherapy    Patient is on Treatment Plan: BREAST PEMBROLIZUMAB Q21D      02/13/2020 Genetic Testing   Positive genetic testing:  A single, heterozygous, pathogenic variant was detected in the BRCA2 gene called [id number]. Testing was completed through the CustomNext-Cancer + RNAinsight panel offered by [facility]. 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 The report date is 02/13/2020.  The CustomNext-Cancer+RNAinsight panel offered by [facility] includes sequencing and rearrangement analysis for the following 47 genes:  APC, ATM, AXIN2, BARD1, BMPR1A, BRCA1,  BRCA2, BRIP1, CDH1, CDK4, CDKN2A, CHEK2, DICER1, EPCAM, GREM1, HOXB13, MEN1, MLH1, MSH2, MSH3, MSH6, MUTYH, NBN, NF1, NF2, NTHL1, PALB2, PMS2, POLD1, POLE, PTEN, RAD51C, RAD51D, RECQL, RET, SDHA, SDHAF2, SDHB, SDHC, SDHD, SMAD4, SMARCA4, STK11, TP53, TSC1, TSC2, and VHL.  RNA data is routinely analyzed for use in variant interpretation for all genes.   08/25/2020 - 02/03/2021 Chemotherapy   Patient is on Treatment Plan : BREAST Pembrolizumab q21d      09/09/2020 Definitive Surgery   FINAL MICROSCOPIC DIAGNOSIS:   A. BREAST, LEFT, MASTECTOMY:  - Mild fibrocystic change with usual ductal hyperplasia and  calcifications  - Negative for carcinoma   B. BREAST, LEFT NIPPLE, BIOPSY:  - Focal atypical lobular hyperplasia  - Mild fibrocystic change  - Negative for carcinoma   C. LYMPH NODE, RIGHT AXILLARY, SENTINEL, EXCISION:  - Lymph node, negative for carcinoma (0/1)   D. LYMPH NODE, RIGHT AXILLARY, SENTINEL, EXCISION:  - Lymph node, negative for carcinoma (0/1)   E. LYMPH NODE, RIGHT AXILLARY, SENTINEL, EXCISION:  - Lymph node, negative for carcinoma (0/1)   F. BREAST, RIGHT, MASTECTOMY:  - Negative for residual carcinoma - complete therapeutic response  - Mild fibrocystic change with calcifications   G. BREAST, RIGHT NIPPLE, BIOPSY:  - Negative for residual carcinoma - complete therapeutic response  - Mild fibrocystic change    02/09/2021 Surgery   Hysterectomy with BSO     CHIEF COMPLIANT: Follow up on Letrozole   INTERVAL HISTORY: Sydney Herman is a 42 y.o. with above-mentioned history of right breast cancer currently on letrozole. She presents to the clinic today for a follow-up. She reports that she does have some joint pain and stiffness. She says she does aches mostly in her hips. She says it is  on a daily basis. She has some fatigue and pain with inter course. Denies any neuropathy in fingers or toes.   ALLERGIES:  is allergic to amoxil [amoxicillin].  MEDICATIONS:   Current Outpatient Medications  Medication Sig Dispense Refill   anastrozole (ARIMIDEX) 1 MG tablet Take 1 tablet (1 mg total) by mouth daily. 90 tablet 3   estradiol (ESTRACE VAGINAL) 0.1 MG/GM vaginal cream Place 1 Applicatorful vaginally at bedtime. 42.5 g 12   cycloSPORINE (RESTASIS) 0.05 % ophthalmic emulsion Instill 1 drop into both eyes twice a day. 180 each 4   escitalopram (LEXAPRO) 10 MG tablet Take 1 tablet (10 mg total) by mouth daily. 90 tablet 3   Sodium Fluoride (CLINPRO 5000) 1.1 % PSTE APPLY A PEA SIZED AMOUNT TO TEETH WITH FINGERS OR A TOOTHBRUSH AFTER BREAKFAST AND JUST BEFORE BED 113 g 4   No current facility-administered medications for this visit.    PHYSICAL EXAMINATION: ECOG PERFORMANCE STATUS: 1 - Symptomatic but completely ambulatory  Vitals:   05/09/22 0919  BP: 112/69  Pulse: 66  Resp: 18  Temp: 97.7 F (36.5 C)  SpO2: 100%   Filed Weights   05/09/22 0919  Weight: 125 lb 1.6 oz (56.7 kg)    BREAST: No palpable lumps or nodules in bilateral reconstructed breasts or axilla. (exam performed in the presence of a chaperone)  LABORATORY DATA:  I have reviewed the data as listed    Latest Ref Rng & Units 05/04/2022   11:29 AM 04/27/2021   12:00 AM 02/03/2021    9:59 AM  CMP  Glucose 65 - 99 mg/dL 90  80  161   BUN 7 - 25 mg/dL 13  14  11    Creatinine 0.50 - 0.99 mg/dL 0.96  0.45  4.09   Sodium 135 - 146 mmol/L 139  139  137   Potassium 3.5 - 5.3 mmol/L 4.5  4.5  3.9   Chloride 98 - 110 mmol/L 101  100  102   CO2 20 - 32 mmol/L 27  29  30    Calcium 8.6 - 10.2 mg/dL 81.1  91.4  9.8   Total Protein 6.1 - 8.1 g/dL 8.2  8.2  8.0   Total Bilirubin 0.2 - 1.2 mg/dL 0.6  0.5  0.4   Alkaline Phos 38 - 126 U/L   90   AST 10 - 30 U/L 26  26  29    ALT 6 - 29 U/L 19  18  22      Lab Results  Component Value Date   WBC 6.7 05/04/2022   HGB 13.6 05/04/2022   HCT 40.3 05/04/2022   MCV 88.8 05/04/2022   PLT 277 05/04/2022   NEUTROABS 4,033 05/04/2022     ASSESSMENT & PLAN:  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: Palpable right breast lump: Mammogram and ultrasound revealed calcifications spanning 2 cm, biopsy revealed IDC with DCIS, grade 2, 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   12/15/2020: Patient completed surgery to replace the expanders with the implants.   Treatment plan: 1. Neo- adjuvant chemotherapy with dose dense Adriamycin and Cytoxan/Keytruda followed by Taxol and carboplatin completed 08/18/2020 2. bilateral mastectomies:09/09/2020: Left mastectomy: Negative for cancer, right mastectomy: Pathologic complete response 0/3 lymph nodes negative 3. Adjuvant antiestrogen therapy (she had hysterectomy and  BSO) Based on extensive discussion back-and-forth we determined that she does not need adjuvant radiation.  _______________________________________________________________________ Current treatment: Letrozole started March 2023, switched to anastrozole 05/20/2022 Letrozole toxicities: 1.  Joint stiffness and achiness: I recommended switching her from letrozole to anastrozole   Concerns in the breast: Breast examination does not reveal any clear-cut abnormalities to warrant further investigation at this time.   Signatera testing: Neg   Breast cancer surveillance: 1.  Breast exam 05/09/2022: Benign 2. no role of imaging studies since she had bilateral mastectomies   Vaginal dryness: I sent a prescription for Estrace Patient is going to [place] next week for vacation. Telephone visit in 6 weeks to discuss tolerance to anastrozole Return to clinic in 1 year for follow-up    No orders of the defined types were placed in this encounter.  The patient has a good understanding of the overall plan. she agrees  with it. she will call with any problems that may develop before the next visit here. Total time spent: 30 mins including face to face time and time spent for planning, charting and co-ordination of care   Tamsen Meek, MD 05/09/22    I Janan Ridge am acting as a Neurosurgeon for [employer]  I have reviewed the above documentation for accuracy and completeness, and I agree with the above.

## 2022-05-13 ENCOUNTER — Other Ambulatory Visit: Payer: Self-pay | Admitting: Family Medicine

## 2022-05-26 DIAGNOSIS — Z170 Estrogen receptor positive status [ER+]: Secondary | ICD-10-CM | POA: Diagnosis not present

## 2022-05-26 DIAGNOSIS — C50411 Malignant neoplasm of upper-outer quadrant of right female breast: Secondary | ICD-10-CM | POA: Diagnosis not present

## 2022-05-26 DIAGNOSIS — Z01419 Encounter for gynecological examination (general) (routine) without abnormal findings: Secondary | ICD-10-CM | POA: Diagnosis not present

## 2022-05-26 DIAGNOSIS — Z6820 Body mass index (BMI) 20.0-20.9, adult: Secondary | ICD-10-CM | POA: Diagnosis not present

## 2022-06-05 LAB — SIGNATERA
SIGNATERA MTM READOUT: 0 MTM/ml
SIGNATERA TEST RESULT: NEGATIVE

## 2022-06-07 DIAGNOSIS — H5213 Myopia, bilateral: Secondary | ICD-10-CM | POA: Diagnosis not present

## 2022-06-14 ENCOUNTER — Encounter: Payer: Self-pay | Admitting: Hematology and Oncology

## 2022-06-14 NOTE — Telephone Encounter (Signed)
Called pt per MD to advise [employer] testing was negative/not detected. Pt verbalized understanding of results and knows [employer] will be in touch to schedule 3 mo repeat lab.   

## 2022-06-15 NOTE — Progress Notes (Signed)
HEMATOLOGY-ONCOLOGY TELEPHONE VISIT PROGRESS NOTE  I connected with our patient on 06/28/22 at 10:15 AM EDT by telephone and verified that I am speaking with the correct person using two identifiers.  I discussed the limitations, risks, security and privacy concerns of performing an evaluation and management service by telephone and the availability of in person appointments.  I also discussed with the patient that there may be a patient responsible charge related to this service. The patient expressed understanding and agreed to proceed.   History of Present Illness:  Sydney Herman is a 42 y.o. with above-mentioned history of right breast cancer currently on letrozole. She presents to the clinic today for a follow-up to discuss tolerance to anastrozole.  Unfortunately she is not tolerating anastrozole any better than letrozole in fact it is worse because she is now having insomnia.  Oncology History  Malignant neoplasm of upper outer quadrant of female breast (HCC)  01/28/2020 Initial Diagnosis   Patient palpated a right breast lump. Diagnostic mammogram and US showed calcifications spanning 2.0cm in the right breast. Biopsy showed invasive and in situ carcinoma, grade 2. ER 5% weak, PR 0% negative, HER2 equiv, Ki 10%     01/28/2020 Cancer Staging   Staging form: Breast, AJCC 8th Edition - Clinical stage from 01/28/2020: Stage IIA (cT2, cN0, cM0, G2, ER+, PR-, HER2-) - Signed by Ronny Bacon, PA-C on 10/07/2020 Stage prefix: Initial diagnosis Method of lymph node assessment: Clinical   02/05/2020 - 08/18/2020 Chemotherapy    Patient is on Treatment Plan: BREAST PEMBROLIZUMAB Q21D      02/13/2020 Genetic Testing   Positive genetic testing:  A single, heterozygous, pathogenic variant was detected in the BRCA2 gene called c.2808_2811delACAA. Testing was completed through the CustomNext-Cancer + RNAinsight panel offered by [facility]. A variant of uncertain significance  (VUS) was also detected in the MSH6 gene called c.2156C>T (p.T719I). The report date is 02/13/2020.  The CustomNext-Cancer+RNAinsight panel offered by [facility] includes sequencing and rearrangement analysis for the following 47 genes:  APC, ATM, AXIN2, BARD1, BMPR1A, BRCA1, BRCA2, BRIP1, CDH1, CDK4, CDKN2A, CHEK2, DICER1, EPCAM, GREM1, HOXB13, MEN1, MLH1, MSH2, MSH3, MSH6, MUTYH, NBN, NF1, NF2, NTHL1, PALB2, PMS2, POLD1, POLE, PTEN, RAD51C, RAD51D, RECQL, RET, SDHA, SDHAF2, SDHB, SDHC, SDHD, SMAD4, SMARCA4, STK11, TP53, TSC1, TSC2, and VHL.  RNA data is routinely analyzed for use in variant interpretation for all genes.   08/25/2020 - 02/03/2021 Chemotherapy   Patient is on Treatment Plan : BREAST Pembrolizumab q21d      09/09/2020 Definitive Surgery   FINAL MICROSCOPIC DIAGNOSIS:   A. BREAST, LEFT, MASTECTOMY:  - Mild fibrocystic change with usual ductal hyperplasia and  calcifications  - Negative for carcinoma   B. BREAST, LEFT NIPPLE, BIOPSY:  - Focal atypical lobular hyperplasia  - Mild fibrocystic change  - Negative for carcinoma   C. LYMPH NODE, RIGHT AXILLARY, SENTINEL, EXCISION:  - Lymph node, negative for carcinoma (0/1)   D. LYMPH NODE, RIGHT AXILLARY, SENTINEL, EXCISION:  - Lymph node, negative for carcinoma (0/1)   E. LYMPH NODE, RIGHT AXILLARY, SENTINEL, EXCISION:  - Lymph node, negative for carcinoma (0/1)   F. BREAST, RIGHT, MASTECTOMY:  - Negative for residual carcinoma - complete therapeutic response  - Mild fibrocystic change with calcifications   G. BREAST, RIGHT NIPPLE, BIOPSY:  - Negative for residual carcinoma - complete therapeutic response  - Mild fibrocystic change    02/09/2021 Surgery   Hysterectomy with BSO     REVIEW OF SYSTEMS:  Constitutional: Denies fevers, chills or abnormal weight loss All other systems were reviewed with the patient and are negative. Observations/Objective:     Assessment Plan:  Malignant neoplasm of upper outer  quadrant of female breast (HCC) 01/28/2020: Palpable right breast lump: Mammogram and ultrasound revealed calcifications spanning 2 cm, biopsy revealed IDC with DCIS, grade 2, 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   12/15/2020: Patient completed surgery to replace the expanders with the implants.   Treatment plan: 1. Neo- adjuvant chemotherapy with dose dense Adriamycin and Cytoxan/Keytruda followed by Taxol and carboplatin completed 08/18/2020 2. bilateral mastectomies:09/09/2020: Left mastectomy: Negative for cancer, right mastectomy: Pathologic complete response 0/3 lymph nodes negative 3. Adjuvant antiestrogen therapy (she had hysterectomy and BSO) Based on extensive discussion back-and-forth we determined that she does not need adjuvant radiation.  _______________________________________________________________________ Current treatment: Letrozole started March 2023, switched to anastrozole 05/20/2022 switched back to letrozole 06/28/2022 Letrozole toxicities: 1.  Joint stiffness and achiness: Unfortunately switching from letrozole to did not make any difference in fact it made it worse because she was having insomnia now  Therefore we decided to switch back to letrozole again.  She will take magnesium supplement to see if that makes a difference to her muscle aches and pains.   Signatera testing: Neg   Breast cancer surveillance: 1.  Breast exam 05/09/2022: Benign 2. no role of imaging studies since she had bilateral mastectomies   Vaginal dryness: I sent a prescription for Estrace  Return to clinic in April 2025    I discussed the assessment and treatment plan with the patient. The patient was provided an opportunity to ask questions and all were answered. The patient agreed with the plan and  demonstrated an understanding of the instructions. The patient was advised to call back or seek an in-person evaluation if the symptoms worsen or if the condition fails to improve as anticipated.   I provided 12 minutes of non-face-to-face time during this encounter.  This includes time for charting and coordination of care   Tamsen Meek, MD  I Janan Ridge am acting as a scribe for Dr.Kelsey Durflinger  I have reviewed the above documentation for accuracy and completeness, and I agree with the above.

## 2022-06-28 ENCOUNTER — Inpatient Hospital Stay: Payer: Commercial Managed Care - PPO | Attending: Hematology and Oncology | Admitting: Hematology and Oncology

## 2022-06-28 DIAGNOSIS — Z170 Estrogen receptor positive status [ER+]: Secondary | ICD-10-CM

## 2022-06-28 DIAGNOSIS — C50411 Malignant neoplasm of upper-outer quadrant of right female breast: Secondary | ICD-10-CM

## 2022-06-28 NOTE — Assessment & Plan Note (Signed)
01/28/2020: Palpable right breast lump: Mammogram and ultrasound revealed calcifications spanning 2 cm, biopsy revealed IDC with DCIS, grade 2, 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   12/15/2020: Patient completed surgery to replace the expanders with the implants.   Treatment plan: 1. Neo- adjuvant chemotherapy with dose dense Adriamycin and Cytoxan/Keytruda followed by Taxol and carboplatin completed 08/18/2020 2. bilateral mastectomies:09/09/2020: Left mastectomy: Negative for cancer, right mastectomy: Pathologic complete response 0/3 lymph nodes negative 3. Adjuvant antiestrogen therapy (she had hysterectomy and BSO) Based on extensive discussion back-and-forth we determined that she does not need adjuvant radiation.  _______________________________________________________________________ Current treatment: Letrozole started March 2023, switched to anastrozole 05/20/2022 Letrozole toxicities: 1.  Joint stiffness and achiness: I recommended switching her from letrozole to anastrozole   Concerns in the breast: Breast examination does not reveal any clear-cut abnormalities to warrant further investigation at this time.   Signatera testing: Neg   Breast cancer surveillance: 1.  Breast exam 05/09/2022: Benign 2. no role of imaging studies since she had bilateral mastectomies   Vaginal dryness: I sent a prescription for Estrace Patient is going to [place] next week for vacation. Telephone visit in 6 weeks to discuss tolerance to anastrozole Return to clinic in 1 year for follow-up

## 2022-08-01 ENCOUNTER — Ambulatory Visit: Payer: Commercial Managed Care - PPO | Attending: Hematology and Oncology | Admitting: Rehabilitation

## 2022-08-01 DIAGNOSIS — Z483 Aftercare following surgery for neoplasm: Secondary | ICD-10-CM | POA: Insufficient documentation

## 2022-08-01 DIAGNOSIS — Z170 Estrogen receptor positive status [ER+]: Secondary | ICD-10-CM | POA: Insufficient documentation

## 2022-08-01 DIAGNOSIS — C50411 Malignant neoplasm of upper-outer quadrant of right female breast: Secondary | ICD-10-CM | POA: Insufficient documentation

## 2022-08-01 NOTE — Therapy (Signed)
OUTPATIENT PHYSICAL THERAPY SOZO SCREENING NOTE   Patient Name: Sydney Herman MRN: [id number] DOB:05/10/1980, 42 y.o., female Today's Date: 08/01/2022  PCP: Everrett Coombe, DO REFERRING PROVIDER: Serena Croissant, MD   PT End of Session - 08/01/22 0936     Visit Number 12   screen only   PT Start Time 0930    PT Stop Time 0936    PT Time Calculation (min) 6 min    Activity Tolerance Patient tolerated treatment well    Behavior During Therapy [facility] for tasks assessed/performed             Past Medical History:  Diagnosis Date   Abdominal wall mass of right lower quadrant 05/17/2013   Saw Dr. Corliss Skains 2015- was thought to be scar tissue- no growth since that time    Breast cancer [facility]) 01/23/2020   Right   Chicken pox    Family history of esophageal cancer    Family history of prostate cancer    Family history of uterine cancer    Generalized headaches    Has had some migraines in past as well. once a week or less. Usually occipital and associated with sinus pressure as well.    Heartburn in pregnancy    Past Surgical History:  Procedure Laterality Date   BREAST RECONSTRUCTION WITH PLACEMENT OF TISSUE EXPANDER AND ALLODERM Bilateral 09/09/2020   Procedure: BILATERAL BREAST RECONSTRUCTION WITH PLACEMENT OF TISSUE EXPANDER AND ALLODERM;  Surgeon: Glenna Fellows, MD;  Location: [facility];  Service: Plastics;  Laterality: Bilateral;   CESAREAN SECTION  07/11/2010   [facility]   CESAREAN SECTION N/A 12/27/2012   womens 2nd    NIPPLE SPARING MASTECTOMY Left 09/09/2020   Procedure: LEFT NIPPLE SPARING MASTECTOMY;  Surgeon: Emelia Loron, MD;  Location: [facility];  Service: General;  Laterality: Left;   NIPPLE SPARING MASTECTOMY WITH SENTINEL LYMPH NODE BIOPSY Right 09/09/2020   Procedure: RIGHT NIPPLE SPARING MASTECTOMY WITH RIGHT AXILLARY SENTINEL LYMPH NODE BIOPSY;  Surgeon: Emelia Loron, MD;  Location: [facility];   Service: General;  Laterality: Right;   PORTACATH PLACEMENT Right 02/04/2020   Procedure: INSERTION PORT-A-CATH WITH ULTRASOUND;  Surgeon: Emelia Loron, MD;  Location: [facility];  Service: General;  Laterality: Right;   REMOVAL OF BILATERAL TISSUE EXPANDERS WITH PLACEMENT OF BILATERAL BREAST IMPLANTS Bilateral 12/15/2020   Procedure: REMOVAL OF BILATERAL TISSUE EXPANDERS WITH PLACEMENT OF BILATERAL BREAST SILICONE IMPLANTS;  Surgeon: Glenna Fellows, MD;  Location: [facility];  Service: Plastics;  Laterality: Bilateral;   TOTAL ABDOMINAL HYSTERECTOMY N/A    WISDOM TOOTH EXTRACTION     Patient Active Problem List   Diagnosis Date Noted   Well adult exam 04/27/2021   Adjustment reaction with anxiety 04/27/2021   Malignant tumor of breast (HCC) 05/12/2020   Port-A-Cath in place 03/19/2020   BRCA2 gene mutation positive 02/21/2020   Family history of prostate cancer    Family history of uterine cancer    Family history of esophageal cancer    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   History of abnormal cervical Pap smear 11/16/2018   Family history of type 2 diabetes mellitus in father 11/16/2018   Family history of breast cancer in mother 11/16/2018   Mild hyperlipidemia 11/02/2017   Vitamin B12 deficiency 02/20/2017   LGSIL (low grade squamous intraepithelial lesion) on Pap smear 10/02/2011    REFERRING DIAG: right breast cancer at risk for lymphedema  THERAPY DIAG: Aftercare following surgery for neoplasm  Malignant neoplasm of upper-outer quadrant of right breast in female, estrogen receptor positive (HCC)  PERTINENT HISTORY: Patient was diagnosed on 01/27/2020 with 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 She underwent neoadjuvant chemotherapy 02/05/2020 - 08/18/2020 followed by a bilateral mastectomy and right sentinel node biopsy (3 negative nodes) on 09/09/2020. The mass  measures 2 cm and is located in the upper outer quadrant with a Ki67 of 10%.   PRECAUTIONS: right UE Lymphedema risk, None  SUBJECTIVE: Pt returns for her 3 month L-Dex screen.   PAIN:  Are you having pain? No  SOZO SCREENING: Patient was assessed today using the SOZO machine to determine the lymphedema index score. This was compared to her baseline score. It was determined that she is within the recommended range when compared to her baseline and no further action is needed at this time. She will continue SOZO screenings. These are done every 3 months for 2 years post operatively followed by every 6 months for 2 years, and then annually.    L-DEX FLOWSHEETS - 08/01/22 0900       L-DEX LYMPHEDEMA SCREENING   Measurement Type Unilateral    L-DEX MEASUREMENT EXTREMITY Upper Extremity    POSITION  Standing    DOMINANT SIDE Right    At Risk Side Right    BASELINE SCORE (UNILATERAL) -2.8    L-DEX SCORE (UNILATERAL) -2.8    VALUE CHANGE (UNILAT) 0              Honest Vanleer, Julieanne Manson, PT 08/01/2022, 9:37 AM

## 2022-08-08 ENCOUNTER — Encounter: Payer: Self-pay | Admitting: Hematology and Oncology

## 2022-08-09 ENCOUNTER — Other Ambulatory Visit: Payer: Self-pay

## 2022-08-09 ENCOUNTER — Other Ambulatory Visit (HOSPITAL_COMMUNITY): Payer: Self-pay

## 2022-08-09 DIAGNOSIS — L579 Skin changes due to chronic exposure to nonionizing radiation, unspecified: Secondary | ICD-10-CM | POA: Diagnosis not present

## 2022-08-09 DIAGNOSIS — L821 Other seborrheic keratosis: Secondary | ICD-10-CM | POA: Diagnosis not present

## 2022-08-09 DIAGNOSIS — L814 Other melanin hyperpigmentation: Secondary | ICD-10-CM | POA: Diagnosis not present

## 2022-08-09 DIAGNOSIS — D225 Melanocytic nevi of trunk: Secondary | ICD-10-CM | POA: Diagnosis not present

## 2022-08-09 MED ORDER — LETROZOLE 2.5 MG PO TABS
2.5000 mg | ORAL_TABLET | Freq: Every day | ORAL | 4 refills | Status: DC
  Filled 2022-08-09: qty 90, 90d supply, fill #0
  Filled 2022-10-23: qty 90, 90d supply, fill #1
  Filled 2023-05-01: qty 90, 90d supply, fill #2
  Filled 2023-08-03: qty 90, 90d supply, fill #3

## 2022-09-07 DIAGNOSIS — Z853 Personal history of malignant neoplasm of breast: Secondary | ICD-10-CM | POA: Diagnosis not present

## 2022-09-07 DIAGNOSIS — Z1509 Genetic susceptibility to other malignant neoplasm: Secondary | ICD-10-CM | POA: Diagnosis not present

## 2022-09-07 DIAGNOSIS — Z9013 Acquired absence of bilateral breasts and nipples: Secondary | ICD-10-CM | POA: Diagnosis not present

## 2022-09-07 DIAGNOSIS — Z1501 Genetic susceptibility to malignant neoplasm of breast: Secondary | ICD-10-CM | POA: Diagnosis not present

## 2022-09-27 ENCOUNTER — Other Ambulatory Visit (HOSPITAL_COMMUNITY): Payer: Self-pay

## 2022-10-03 ENCOUNTER — Encounter: Payer: Self-pay | Admitting: Family Medicine

## 2022-10-07 LAB — BMP8+EGFR
BUN/Creatinine Ratio: 23 (ref 9–23)
BUN: 19 mg/dL (ref 6–24)
CO2: 25 mmol/L (ref 20–29)
Calcium: 9.9 mg/dL (ref 8.7–10.2)
Chloride: 99 mmol/L (ref 96–106)
Creatinine, Ser: 0.82 mg/dL (ref 0.57–1.00)
Glucose: 83 mg/dL (ref 70–99)
Potassium: 4.6 mmol/L (ref 3.5–5.2)
Sodium: 138 mmol/L (ref 134–144)
eGFR: 92 mL/min/{1.73_m2} (ref >59)

## 2022-10-10 ENCOUNTER — Ambulatory Visit: Payer: Commercial Managed Care - PPO | Admitting: Family Medicine

## 2022-10-10 ENCOUNTER — Encounter: Payer: Self-pay | Admitting: Family Medicine

## 2022-10-10 VITALS — BP 98/68 | HR 82

## 2022-10-10 DIAGNOSIS — J029 Acute pharyngitis, unspecified: Secondary | ICD-10-CM

## 2022-10-10 LAB — POCT RAPID STREP A (OFFICE): Rapid Strep A Screen: NEGATIVE

## 2022-10-10 NOTE — Progress Notes (Signed)
Acute Office Visit  Subjective:     Patient ID: Sydney Herman, female    DOB: 26-Aug-1980, 42 y.o.   MRN: [id number]  Chief Complaint  Patient presents with   Sinusitis    HPI Patient is in today for sore throat.  She says for couple of weeks on and off she has had some postnasal drip drainage and nasal congestion but this weekend she started experiencing a very sore throat.  Is been painful to swallow.  No fevers chills or sweats.  No GI symptoms.  But she is very worried about having strep throat she has had in the past.  ROS      Objective:    BP 98/68   Pulse 82   LMP  (LMP Unknown)   SpO2 98%    Physical Exam Constitutional:      Appearance: Normal appearance.  HENT:     Head: Normocephalic and atraumatic.     Right Ear: Tympanic membrane, ear canal and external ear normal. There is no impacted cerumen.     Left Ear: Tympanic membrane, ear canal and external ear normal. There is no impacted cerumen.     Nose: Nose normal.     Mouth/Throat:     Pharynx: Oropharynx is clear. Posterior oropharyngeal erythema present.     Comments: Mild erythema of post pharynx.  Eyes:     Conjunctiva/sclera: Conjunctivae normal.  Cardiovascular:     Rate and Rhythm: Normal rate and regular rhythm.  Pulmonary:     Effort: Pulmonary effort is normal.     Breath sounds: Normal breath sounds.  Musculoskeletal:     Cervical back: Neck supple. No tenderness.  Lymphadenopathy:     Cervical: No cervical adenopathy.  Skin:    General: Skin is warm and dry.  Neurological:     Mental Status: She is alert and oriented to person, place, and time.  Psychiatric:        Mood and Affect: Mood normal.     Results for orders placed or performed in visit on 10/10/22  POCT rapid strep A  Result Value Ref Range   Rapid Strep A Screen Negative Negative        Assessment & Plan:   Problem List Items Addressed This Visit   None Visit Diagnoses     Sore throat    -  Primary    Relevant Orders   POCT rapid strep A (Completed)      It is difficult to say if the sinus symptoms are directly related to allergies which has been her suspicion she has been using Nasacort and Claritin.  But now that she has a sore throat it is difficult to say if it is may be getting worse or turning more into a sinusitis.  We discussed given the sore throat a couple days to see if it resolves on its own gave her some strategies including salt water gargles, honey, Chloraseptic spray staying hydrated using Tylenol etc. but if not improving or developing worsening symptoms please let us know and we can treat for sinusitis if needed.  No orders of the defined types were placed in this encounter.   No follow-ups on file.  Nani Gasser, MD

## 2022-10-12 ENCOUNTER — Other Ambulatory Visit (HOSPITAL_COMMUNITY): Payer: Self-pay

## 2022-10-12 ENCOUNTER — Encounter: Payer: Self-pay | Admitting: Family Medicine

## 2022-10-12 DIAGNOSIS — C50411 Malignant neoplasm of upper-outer quadrant of right female breast: Secondary | ICD-10-CM | POA: Diagnosis not present

## 2022-10-12 DIAGNOSIS — Z170 Estrogen receptor positive status [ER+]: Secondary | ICD-10-CM | POA: Diagnosis not present

## 2022-10-12 MED ORDER — AZITHROMYCIN 250 MG PO TABS
ORAL_TABLET | ORAL | 0 refills | Status: AC
  Filled 2022-10-12: qty 6, 5d supply, fill #0

## 2022-10-28 IMAGING — MG MM BREAST LOCALIZATION CLIP
4 series · 4 of 12 positions shown · non-contrast
Comparison: Previous exam(s).

CLINICAL DATA: Post procedure mammogram for clip placement.

EXAM:
DIAGNOSTIC RIGHT MAMMOGRAM POST STEREOTACTIC BIOPSY

[R ML synth-2D]
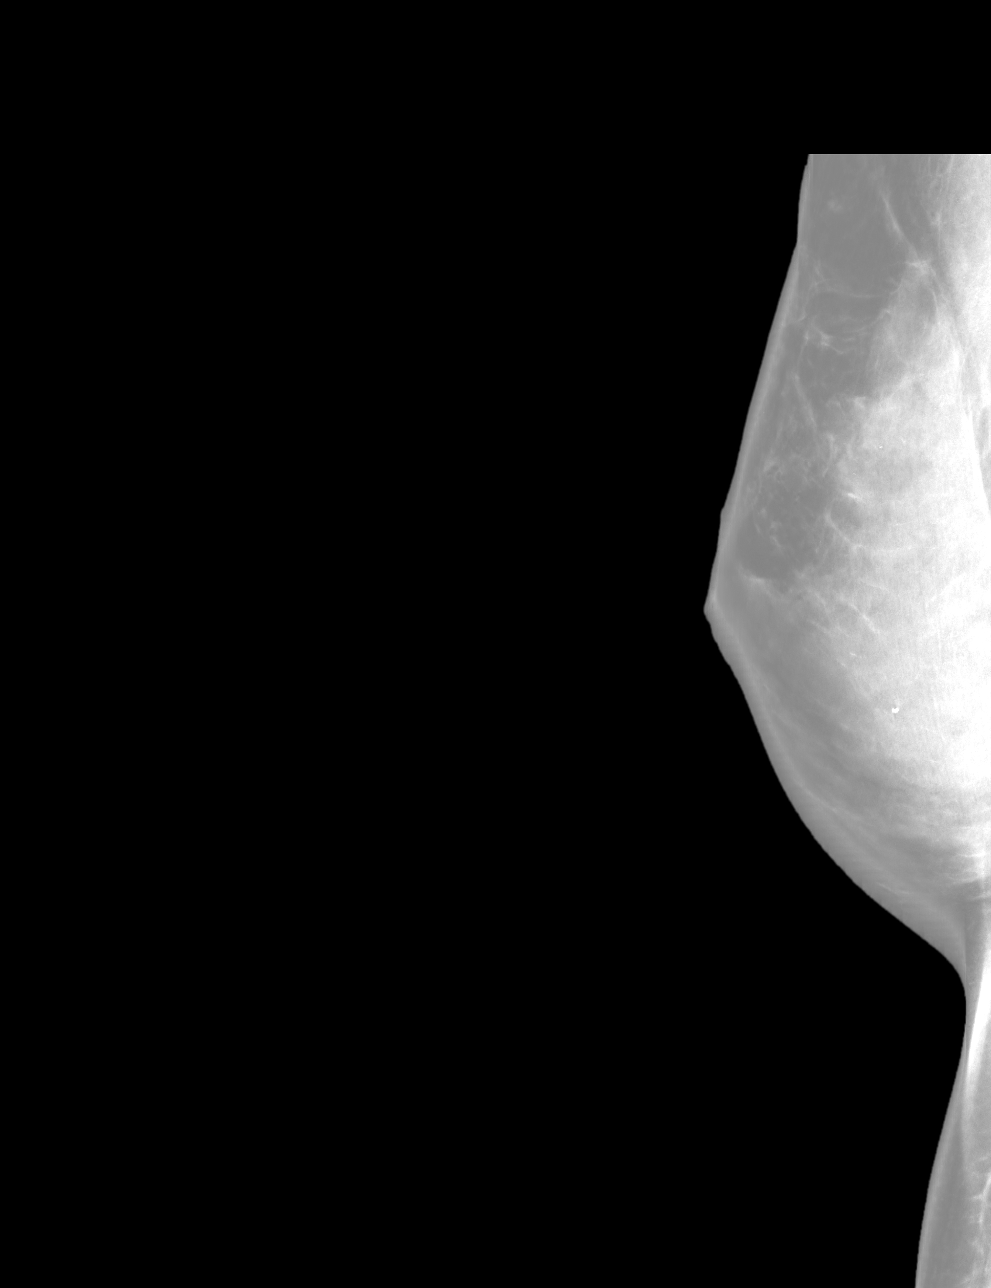

[R CC synth-2D]
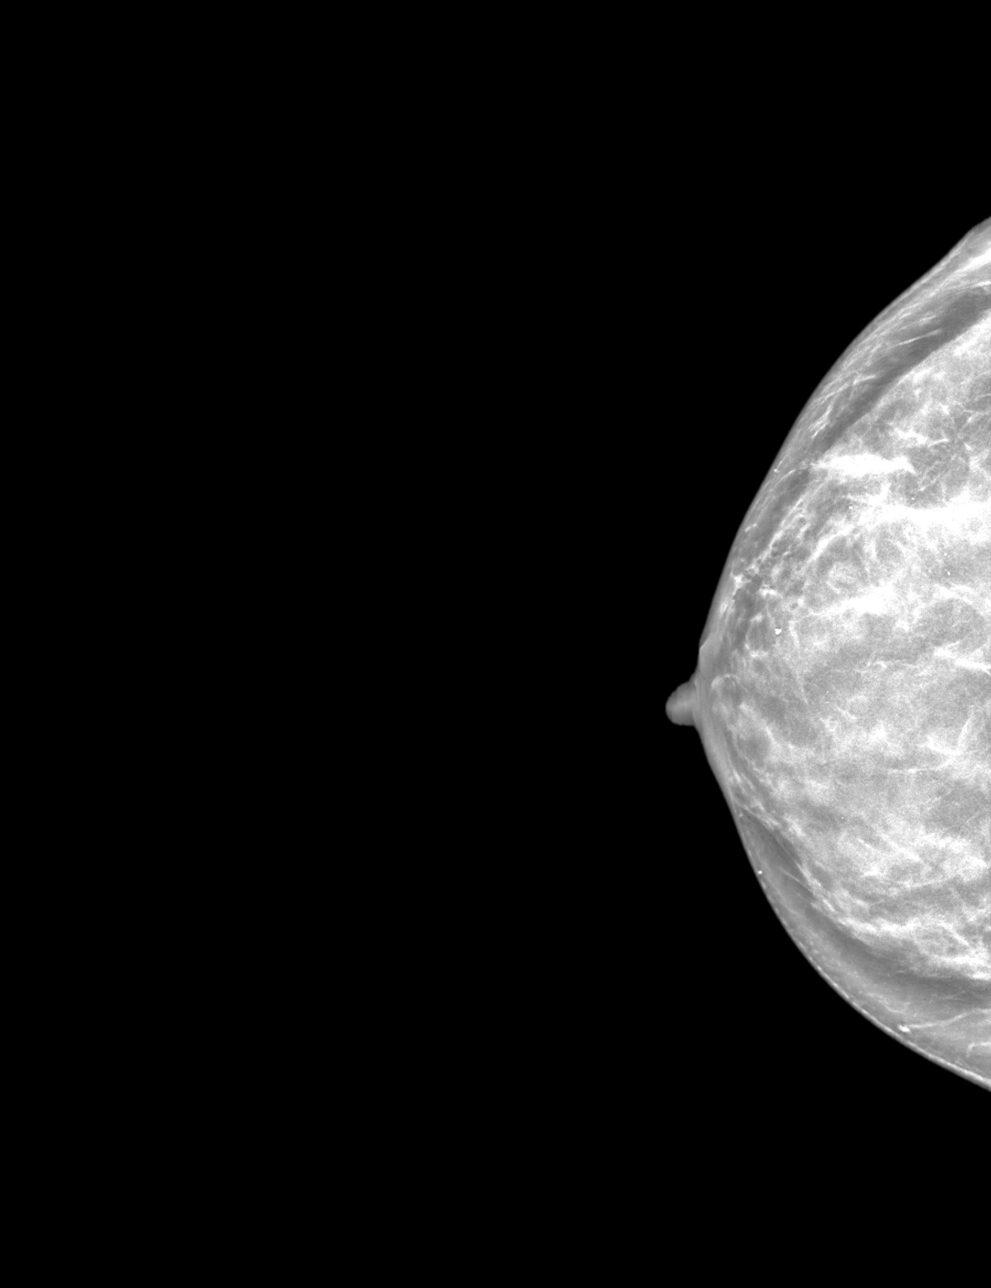

[R CC tomo · tomo slice 29/57.0]
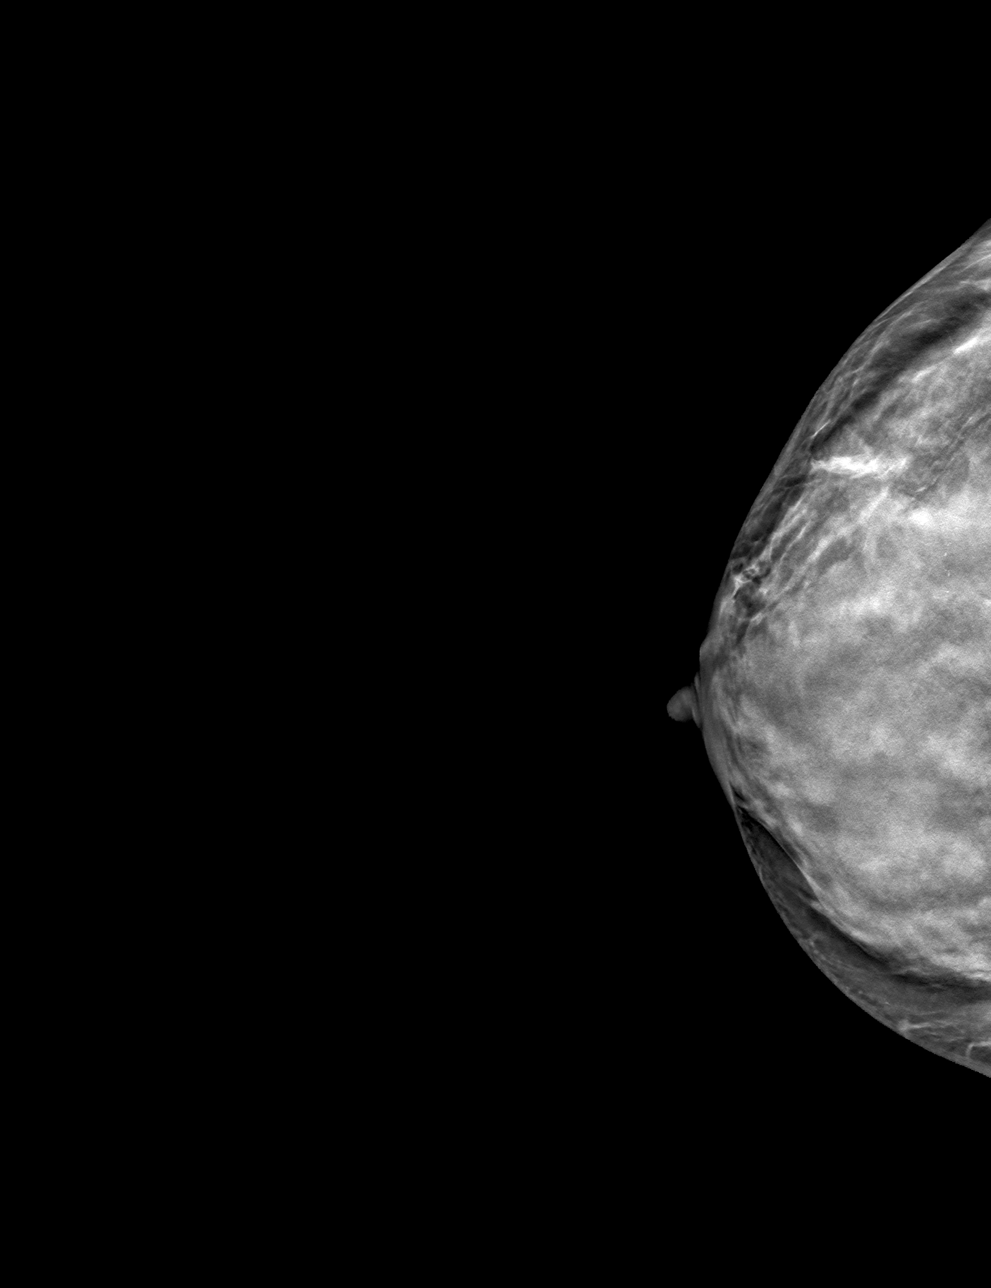

[R ML tomo · tomo slice 43/86.0]
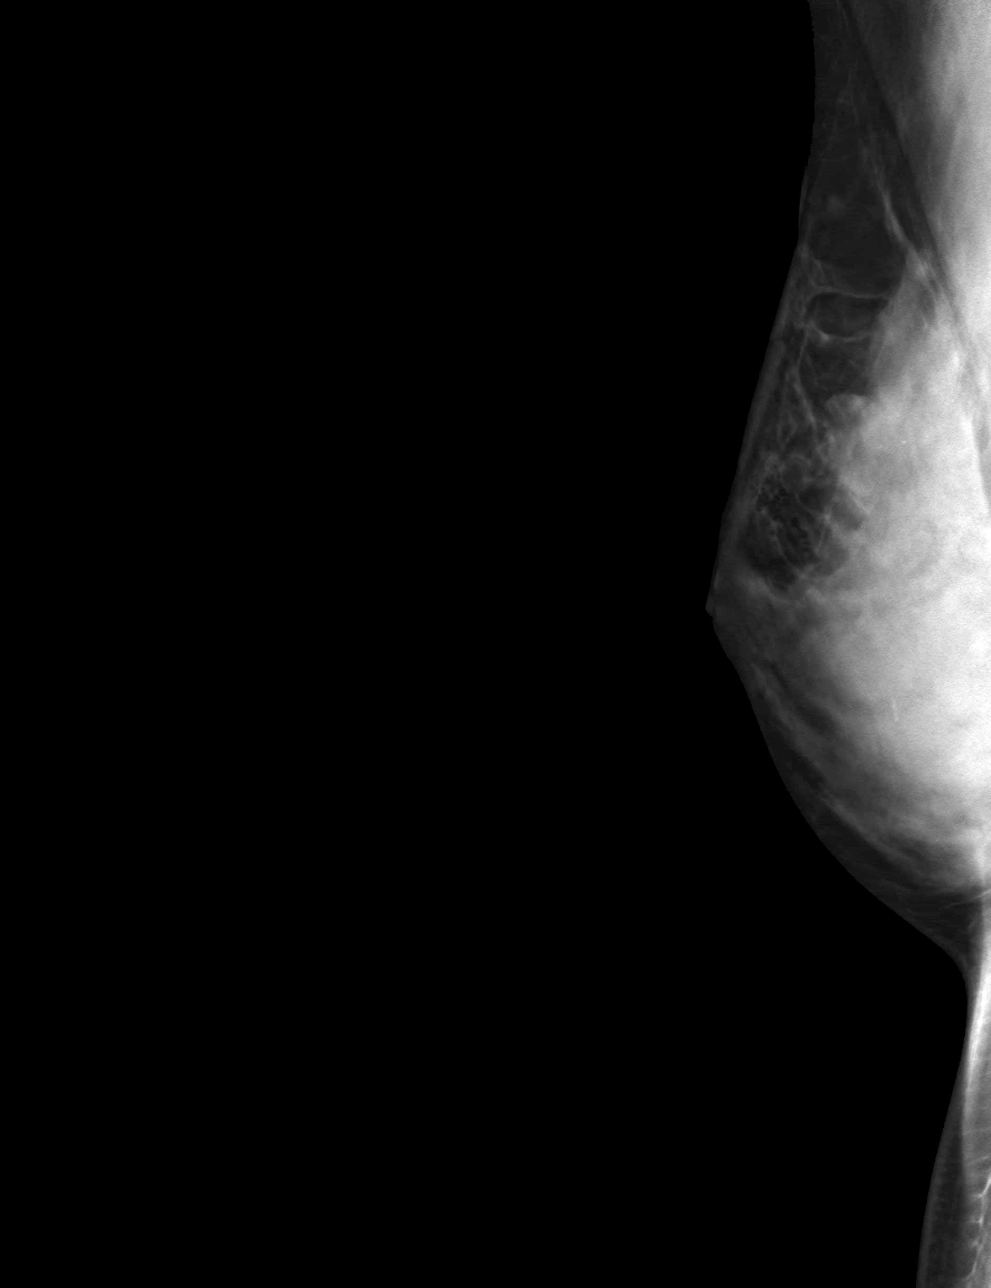

[4 of 12 positions shown; findings below may reference images not displayed]

FINDINGS: Mammographic images were obtained following stereotactic guided
biopsy of calcifications in the upper outer right breast. There are
biopsy site changes in the upper outer quadrant. A biopsy marking
clip was not placed because the patient fainted during the biopsy
necessitating that the procedure be stopped prior to clip placement.
There are multiple residual calcifications that could be targeted
for localization.
IMPRESSION: There are biopsy site changes in the upper outer right breast
without a biopsy marking clip. A clip was not placed at the time of
biopsy because the patient fainted during the procedure. There are
multiple residual calcifications that could be localized if
necessary.

Final Assessment: Post Procedure Mammograms for Marker Placement

## 2022-11-16 IMAGING — MG MM PLC BREAST LOC DEV 1ST LESION INC STEREO GUIDE*R*
8 of 11 series · 8 of 27 positions shown · non-contrast
Comparison: Previous exam(s)

CLINICAL DATA: Stereotactic biopsy was performed on 01/23/2020 for
RIGHT breast calcifications with pathology result of IDC and DCIS.
At the time of stereotactic biopsy, patient fainted prior to
placement of the postprocedural biopsy site marker. Patient returns
today for placement of the biopsy site marker in the vicinity of the
residual calcifications in the upper RIGHT breast.

EXAM:
MAMMOGRAPHIC GUIDED CLIP PLACEMENT OF THE RIGHT BREAST

[R LM (1 of 3)]
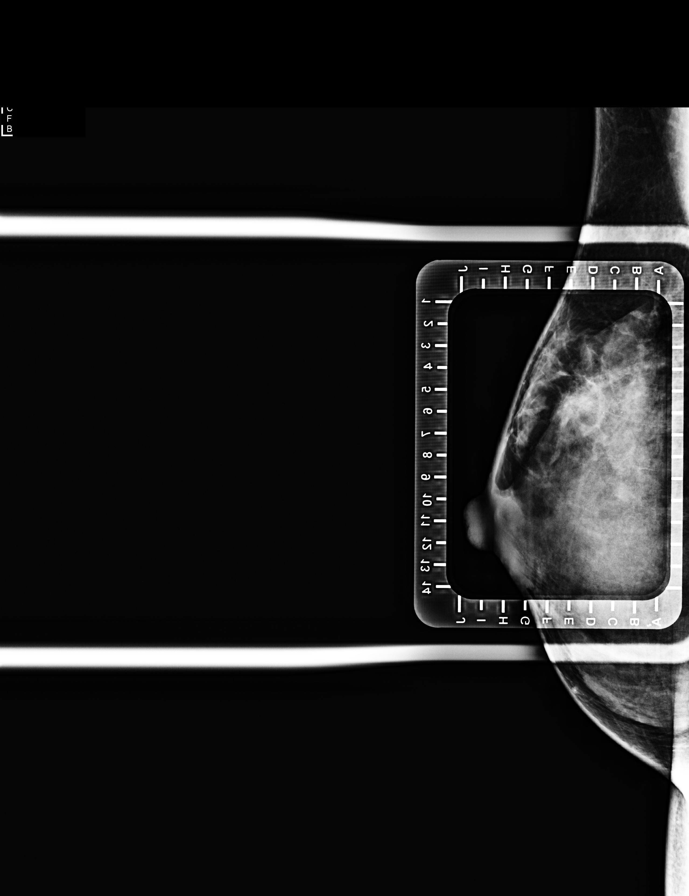

[R LM (2 of 3)]
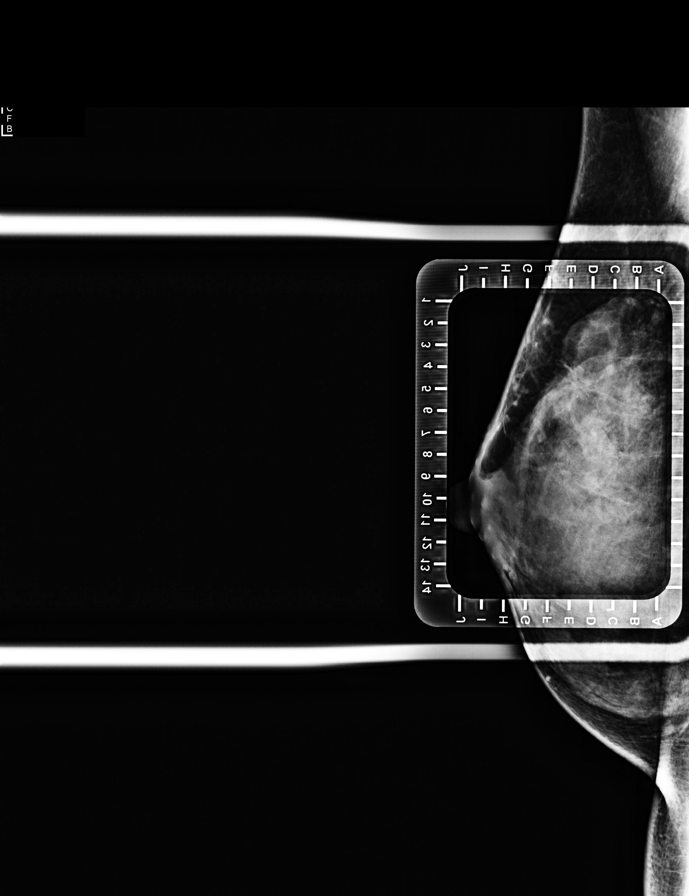

[R LM (3 of 3)]
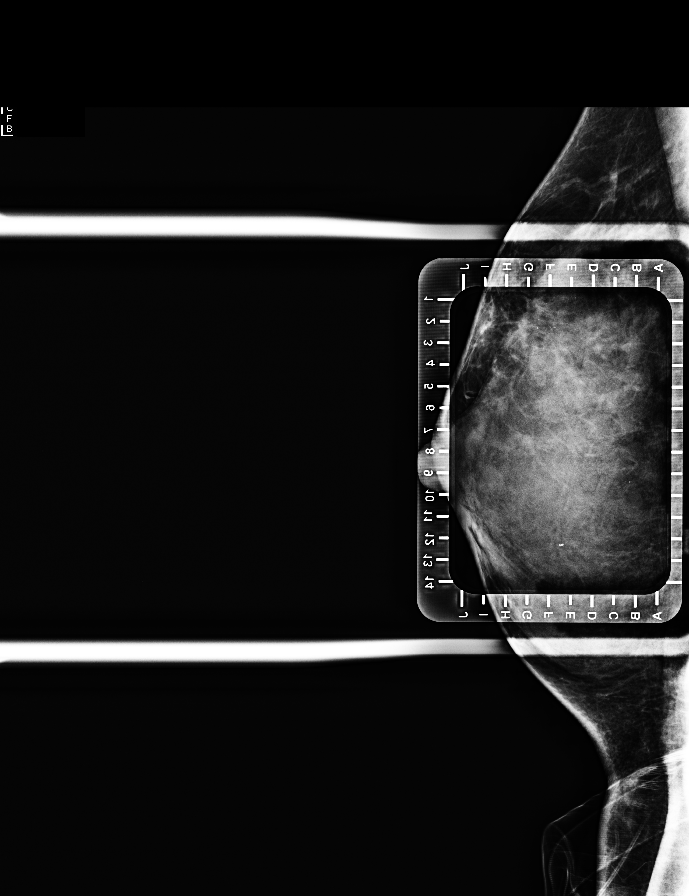

[R LM synth-2D (1 of 4)]
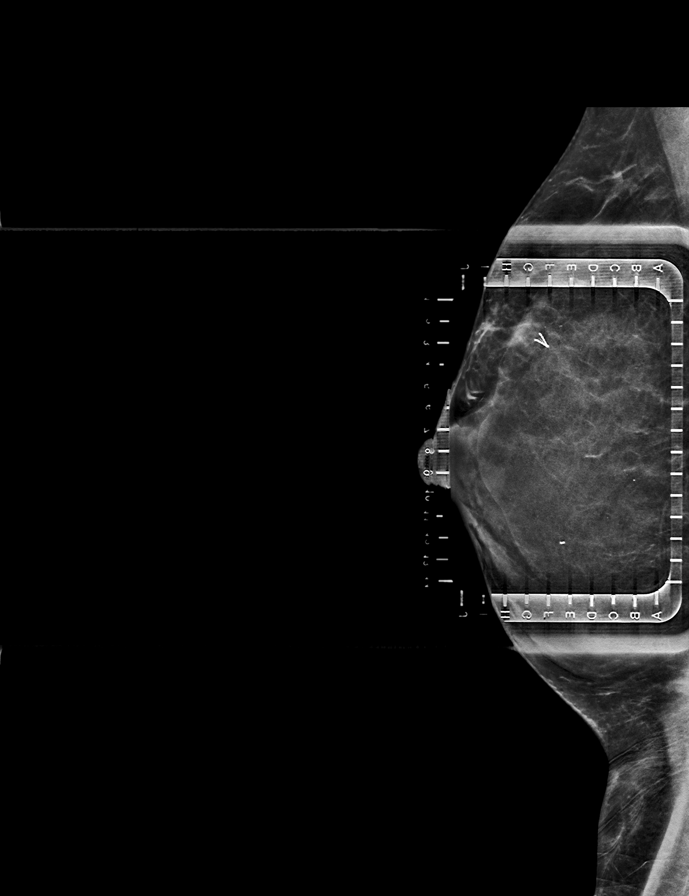

[R LM synth-2D (2 of 4)]
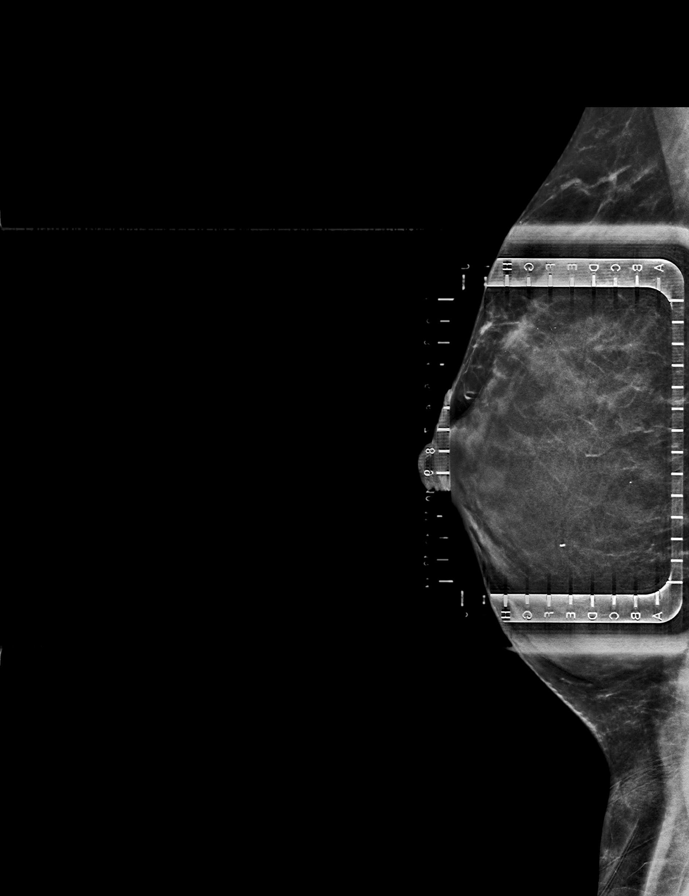

[R LM synth-2D (3 of 4)]
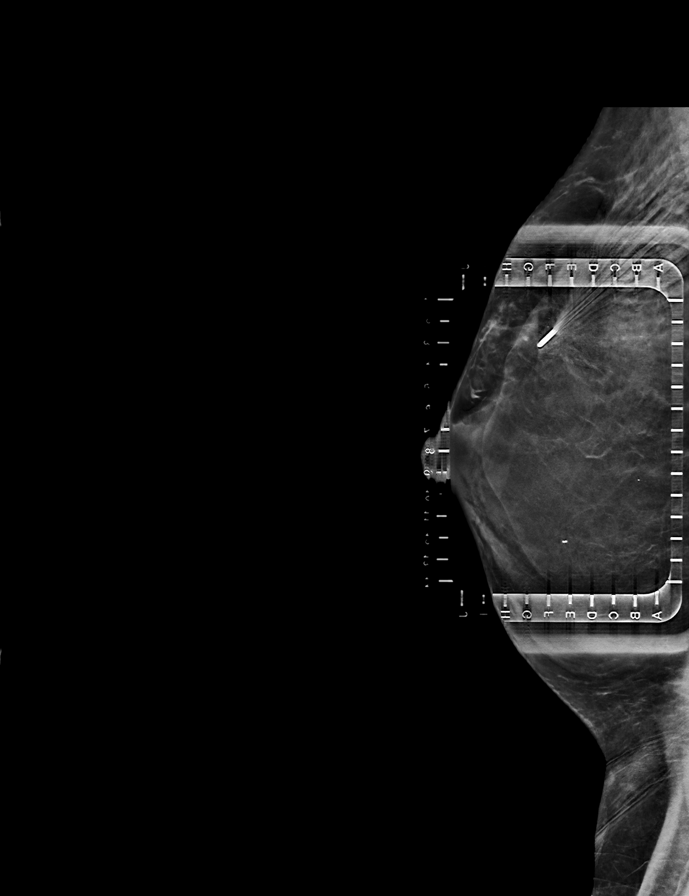

[R LM synth-2D (4 of 4)]
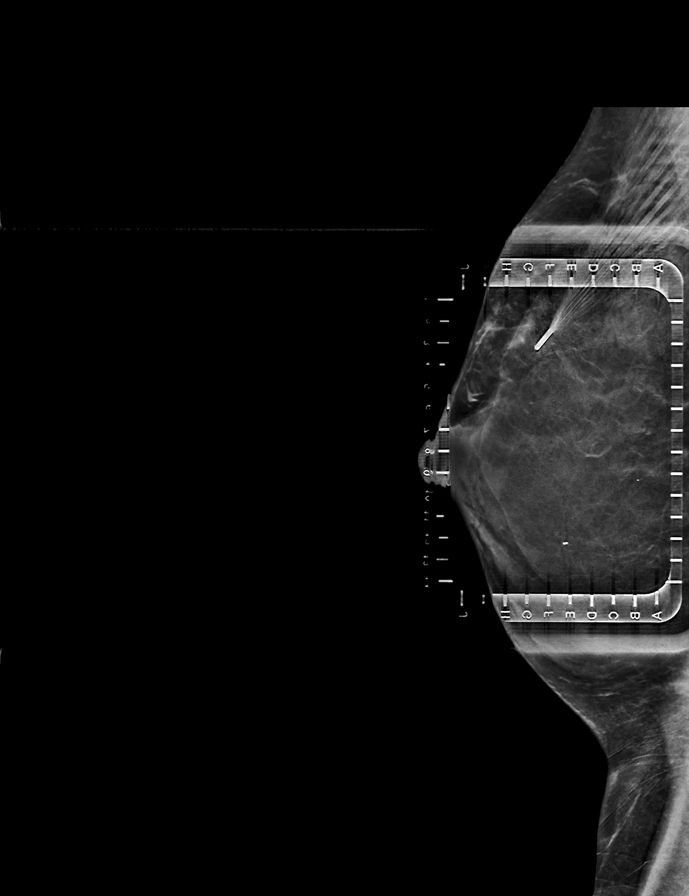

[R LM tomo · tomo slice 25/48.0]
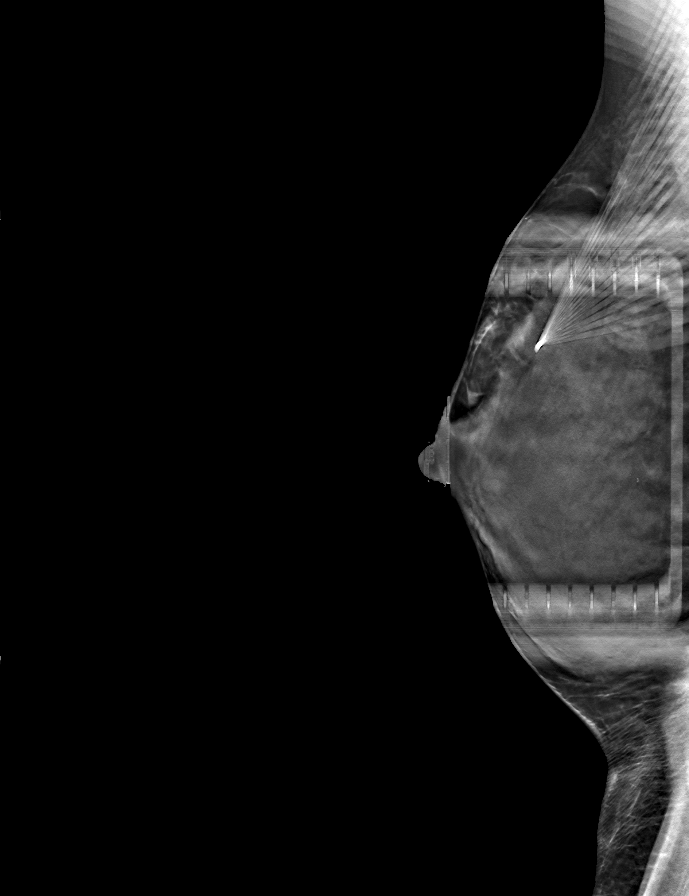

[8 of 27 positions shown; findings below may reference images not displayed]

FINDINGS: Patient presents for placement of a biopsy site marker in the RIGHT
breast in the vicinity of residual calcifications in the upper RIGHT
breast. I met with the patient and we discussed the procedure of
localization. We discussed the high likelihood of a successful
procedure. We discussed the risks of the procedure including
infection, bleeding, tissue injury and further surgery. Informed,
written consent was given.

The usual time-out protocol was performed immediately prior to the
procedure.

Using mammographic guidance, sterile technique, 1% lidocaine as
local anesthesia, a Q shaped biopsy clip was placed adjacent to the
residual calcifications in the upper RIGHT breast.

The follow-up mammogram images confirm appropriate positioning of
the Q shaped biopsy clip adjacent to the residual calcifications in
the upper RIGHT breast.

The patient tolerated the final portion of the procedure well and
was released from the [REDACTED] in good condition.
IMPRESSION: Q shaped biopsy site marker placement of the RIGHT breast.

Patient again had a vasovagal reaction, and fainted, during today's
clip (Q shaped) placement procedure. However, the Q shaped clip was
eventually placed (as above) with the patient [REDACTED]ubitus
positioning and appears appropriately positioned adjacent to the
residual calcifications in the upper RIGHT breast.

Given the repeated fainting, recommend using HydroMARK biopsy site
markers for the recommended 2 site bracketed MRI-guided biopsy to
determine extent of disease. Doing so will allow the possibility of
subsequent ultrasound-guided radioactive seed localizations if
needed.

## 2022-11-30 ENCOUNTER — Encounter: Payer: Self-pay | Admitting: Hematology and Oncology

## 2022-11-30 NOTE — Telephone Encounter (Signed)
Telephone call  

## 2022-12-12 LAB — SIGNATERA
SIGNATERA MTM READOUT: 0 MTM/ml
SIGNATERA TEST RESULT: NEGATIVE

## 2022-12-14 ENCOUNTER — Telehealth: Payer: Self-pay

## 2022-12-14 NOTE — Telephone Encounter (Signed)
Called pt per MD to advise [employer] testing was negative/not detected. Pt verbalized understanding of results and knows [employer] will be in touch to schedule 3 mo repeat lab.   

## 2022-12-30 ENCOUNTER — Other Ambulatory Visit (HOSPITAL_COMMUNITY): Payer: Self-pay

## 2023-01-23 ENCOUNTER — Ambulatory Visit: Payer: Commercial Managed Care - PPO | Attending: Hematology and Oncology

## 2023-01-23 VITALS — Wt 123.2 lb

## 2023-01-23 DIAGNOSIS — Z483 Aftercare following surgery for neoplasm: Secondary | ICD-10-CM | POA: Insufficient documentation

## 2023-01-23 NOTE — Therapy (Signed)
 OUTPATIENT PHYSICAL THERAPY SOZO SCREENING NOTE   Patient Name: Sydney Herman MRN: [id number] DOB:13-Mar-1980, 43 y.o., female Today's Date: 01/23/2023  PCP: Alvia Bring, DO REFERRING PROVIDER: Odean Potts, MD   PT End of Session - 01/23/23 0940     Visit Number 12   # unchanged due to screen only   PT Start Time 0939    PT Stop Time 0942    PT Time Calculation (min) 3 min    Activity Tolerance Patient tolerated treatment well    Behavior During Therapy [facility] for tasks assessed/performed             Past Medical History:  Diagnosis Date   Abdominal wall mass of right lower quadrant 05/17/2013   Saw Dr. Belinda 2015- was thought to be scar tissue- no growth since that time    Breast cancer [facility]) 01/23/2020   Right   Chicken pox    Family history of esophageal cancer    Family history of prostate cancer    Family history of uterine cancer    Generalized headaches    Has had some migraines in past as well. once a week or less. Usually occipital and associated with sinus pressure as well.    Heartburn in pregnancy    Past Surgical History:  Procedure Laterality Date   BREAST RECONSTRUCTION WITH PLACEMENT OF TISSUE EXPANDER AND ALLODERM Bilateral 09/09/2020   Procedure: BILATERAL BREAST RECONSTRUCTION WITH PLACEMENT OF TISSUE EXPANDER AND ALLODERM;  Surgeon: Arelia Filippo, MD;  Location: [facility];  Service: Plastics;  Laterality: Bilateral;   CESAREAN SECTION  07/11/2010   [facility]   CESAREAN SECTION N/A 12/27/2012   womens 2nd    NIPPLE SPARING MASTECTOMY Left 09/09/2020   Procedure: LEFT NIPPLE SPARING MASTECTOMY;  Surgeon: Ebbie Cough, MD;  Location: [facility];  Service: General;  Laterality: Left;   NIPPLE SPARING MASTECTOMY WITH SENTINEL LYMPH NODE BIOPSY Right 09/09/2020   Procedure: RIGHT NIPPLE SPARING MASTECTOMY WITH RIGHT AXILLARY SENTINEL LYMPH NODE BIOPSY;  Surgeon: Ebbie Cough, MD;  Location: [facility];  Service: General;  Laterality: Right;   PORTACATH PLACEMENT Right 02/04/2020   Procedure: INSERTION PORT-A-CATH WITH ULTRASOUND;  Surgeon: Ebbie Cough, MD;  Location: [facility];  Service: General;  Laterality: Right;   REMOVAL OF BILATERAL TISSUE EXPANDERS WITH PLACEMENT OF BILATERAL BREAST IMPLANTS Bilateral 12/15/2020   Procedure: REMOVAL OF BILATERAL TISSUE EXPANDERS WITH PLACEMENT OF BILATERAL BREAST SILICONE IMPLANTS;  Surgeon: Arelia Filippo, MD;  Location: [facility];  Service: Plastics;  Laterality: Bilateral;   TOTAL ABDOMINAL HYSTERECTOMY N/A    WISDOM TOOTH EXTRACTION     Patient Active Problem List   Diagnosis Date Noted   Well adult exam 04/27/2021   Adjustment reaction with anxiety 04/27/2021   Malignant tumor of breast (HCC) 05/12/2020   Port-A-Cath in place 03/19/2020   BRCA2 gene mutation positive 02/21/2020   Family history of prostate cancer    Family history of uterine cancer    Family history of esophageal cancer    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 She underwent neoadjuvant chemotherapy 02/05/2020 - 08/18/2020 followed by a bilateral mastectomy and right sentinel node biopsy (3 negative nodes) on 09/09/2020. The mass measures 2 cm and is located in the upper outer quadrant with a Ki67 of 10%.   PRECAUTIONS:  right UE Lymphedema risk, None  SUBJECTIVE: Pt returns for her 6 month L-Dex screen.   PAIN:  Are you having pain? No  SOZO SCREENING: Patient was assessed today using the SOZO machine to determine the lymphedema index score. This was compared to her baseline score. It was determined that she is within the recommended range when compared to her baseline and no further action is needed at this time. She will continue SOZO screenings. These are done every 3 months for 2 years post operatively followed by every 6 months for 2 years, and then annually.    L-DEX FLOWSHEETS - 01/23/23 0900       L-DEX LYMPHEDEMA SCREENING   Measurement Type Unilateral    L-DEX MEASUREMENT EXTREMITY Upper Extremity    POSITION  Standing    DOMINANT SIDE Right    At Risk Side Right    BASELINE SCORE (UNILATERAL) -2.8    L-DEX SCORE (UNILATERAL) 0.2    VALUE CHANGE (UNILAT) 3              Aden Berwyn Caldron, PTA 01/23/2023, 9:42 AM

## 2023-02-09 ENCOUNTER — Other Ambulatory Visit (HOSPITAL_COMMUNITY): Payer: Self-pay

## 2023-02-14 ENCOUNTER — Other Ambulatory Visit: Payer: Self-pay | Admitting: *Deleted

## 2023-02-14 DIAGNOSIS — C50411 Malignant neoplasm of upper-outer quadrant of right female breast: Secondary | ICD-10-CM

## 2023-02-14 NOTE — Progress Notes (Signed)
Renewal orders placed for Signatera testing.

## 2023-02-20 ENCOUNTER — Other Ambulatory Visit (HOSPITAL_COMMUNITY): Payer: Self-pay

## 2023-02-20 ENCOUNTER — Other Ambulatory Visit (HOSPITAL_BASED_OUTPATIENT_CLINIC_OR_DEPARTMENT_OTHER): Payer: Self-pay

## 2023-02-20 DIAGNOSIS — H18832 Recurrent erosion of cornea, left eye: Secondary | ICD-10-CM | POA: Diagnosis not present

## 2023-02-20 MED ORDER — NEOMYCIN-POLYMYXIN-DEXAMETH 3.5-10000-0.1 OP SUSP
1.0000 [drp] | Freq: Four times a day (QID) | OPHTHALMIC | 0 refills | Status: DC
  Filled 2023-02-20: qty 5, 25d supply, fill #0

## 2023-02-28 LAB — SIGNATERA ONLY (NATERA MANAGED)

## 2023-03-10 DIAGNOSIS — Z170 Estrogen receptor positive status [ER+]: Secondary | ICD-10-CM | POA: Diagnosis not present

## 2023-03-10 DIAGNOSIS — C50411 Malignant neoplasm of upper-outer quadrant of right female breast: Secondary | ICD-10-CM | POA: Diagnosis not present

## 2023-03-16 LAB — SIGNATERA
SIGNATERA MTM READOUT: 0 MTM/ml
SIGNATERA TEST RESULT: NEGATIVE

## 2023-05-09 ENCOUNTER — Ambulatory Visit (INDEPENDENT_AMBULATORY_CARE_PROVIDER_SITE_OTHER): Payer: Commercial Managed Care - PPO | Admitting: Family Medicine

## 2023-05-09 ENCOUNTER — Encounter: Payer: Self-pay | Admitting: Family Medicine

## 2023-05-09 ENCOUNTER — Other Ambulatory Visit (HOSPITAL_COMMUNITY): Payer: Self-pay

## 2023-05-09 VITALS — BP 95/60 | HR 58 | Ht 61.0 in | Wt 126.0 lb

## 2023-05-09 DIAGNOSIS — E785 Hyperlipidemia, unspecified: Secondary | ICD-10-CM | POA: Diagnosis not present

## 2023-05-09 DIAGNOSIS — E538 Deficiency of other specified B group vitamins: Secondary | ICD-10-CM

## 2023-05-09 DIAGNOSIS — Z0000 Encounter for general adult medical examination without abnormal findings: Secondary | ICD-10-CM

## 2023-05-09 MED ORDER — ESCITALOPRAM OXALATE 10 MG PO TABS
10.0000 mg | ORAL_TABLET | Freq: Every day | ORAL | 3 refills | Status: AC
  Filled 2023-05-09: qty 90, 90d supply, fill #0
  Filled 2023-08-03: qty 90, 90d supply, fill #1
  Filled 2023-10-31: qty 90, 90d supply, fill #2
  Filled 2024-01-31: qty 90, 90d supply, fill #3

## 2023-05-09 NOTE — Patient Instructions (Signed)
 Preventive Care 16-42 Years Old, Female  Preventive care refers to lifestyle choices and visits with your health care provider that can promote health and wellness. Preventive care visits are also called wellness exams.  What can I expect for my preventive care visit?  Counseling  Your health care provider may ask you questions about your:  Medical history, including:  Past medical problems.  Family medical history.  Pregnancy history.  Current health, including:  Menstrual cycle.  Method of birth control.  Emotional well-being.  Home life and relationship well-being.  Sexual activity and sexual health.  Lifestyle, including:  Alcohol, nicotine or tobacco, and drug use.  Access to firearms.  Diet, exercise, and sleep habits.  Work and work Astronomer.  Sunscreen use.  Safety issues such as seatbelt and bike helmet use.  Physical exam  Your health care provider will check your:  Height and weight. These may be used to calculate your BMI (body mass index). BMI is a measurement that tells if you are at a healthy weight.  Waist circumference. This measures the distance around your waistline. This measurement also tells if you are at a healthy weight and may help predict your risk of certain diseases, such as type 2 diabetes and high blood pressure.  Heart rate and blood pressure.  Body temperature.  Skin for abnormal spots.  What immunizations do I need?    Vaccines are usually given at various ages, according to a schedule. Your health care provider will recommend vaccines for you based on your age, medical history, and lifestyle or other factors, such as travel or where you work.  What tests do I need?  Screening  Your health care provider may recommend screening tests for certain conditions. This may include:  Lipid and cholesterol levels.  Diabetes screening. This is done by checking your blood sugar (glucose) after you have not eaten for a while (fasting).  Pelvic exam and Pap test.  Hepatitis B test.  Hepatitis C  test.  HIV (human immunodeficiency virus) test.  STI (sexually transmitted infection) testing, if you are at risk.  Lung cancer screening.  Colorectal cancer screening.  Mammogram. Talk with your health care provider about when you should start having regular mammograms. This may depend on whether you have a family history of breast cancer.  BRCA-related cancer screening. This may be done if you have a family history of breast, ovarian, tubal, or peritoneal cancers.  Bone density scan. This is done to screen for osteoporosis.  Talk with your health care provider about your test results, treatment options, and if necessary, the need for more tests.  Follow these instructions at home:  Eating and drinking    Eat a diet that includes fresh fruits and vegetables, whole grains, lean protein, and low-fat dairy products.  Take vitamin and mineral supplements as recommended by your health care provider.  Do not drink alcohol if:  Your health care provider tells you not to drink.  You are pregnant, may be pregnant, or are planning to become pregnant.  If you drink alcohol:  Limit how much you have to 0-1 drink a day.  Know how much alcohol is in your drink. In the [place] one drink equals one 12 oz bottle of beer (355 mL), one 5 oz glass of wine (148 mL), or one 1 oz glass of hard liquor (44 mL).  Lifestyle  Brush your teeth every morning and night with fluoride toothpaste. Floss one time each day.  Exercise for at least  30 minutes 5 or more days each week.  Do not use any products that contain nicotine or tobacco. These products include cigarettes, chewing tobacco, and vaping devices, such as e-cigarettes. If you need help quitting, ask your health care provider.  Do not use drugs.  If you are sexually active, practice safe sex. Use a condom or other form of protection to prevent STIs.  If you do not wish to become pregnant, use a form of birth control. If you plan to become pregnant, see your health care provider for a  prepregnancy visit.  Take aspirin only as told by your health care provider. Make sure that you understand how much to take and what form to take. Work with your health care provider to find out whether it is safe and beneficial for you to take aspirin daily.  Find healthy ways to manage stress, such as:  Meditation, yoga, or listening to music.  Journaling.  Talking to a trusted person.  Spending time with friends and family.  Minimize exposure to UV radiation to reduce your risk of skin cancer.  Safety  Always wear your seat belt while driving or riding in a vehicle.  Do not drive:  If you have been drinking alcohol. Do not ride with someone who has been drinking.  When you are tired or distracted.  While texting.  If you have been using any mind-altering substances or drugs.  Wear a helmet and other protective equipment during sports activities.  If you have firearms in your house, make sure you follow all gun safety procedures.  Seek help if you have been physically or sexually abused.  What's next?  Visit your health care provider once a year for an annual wellness visit.  Ask your health care provider how often you should have your eyes and teeth checked.  Stay up to date on all vaccines.  This information is not intended to replace advice given to you by your health care provider. Make sure you discuss any questions you have with your health care provider.  Document Revised: 06/24/2020 Document Reviewed: 06/24/2020  Elsevier Patient Education  2024 [employer].

## 2023-05-09 NOTE — Assessment & Plan Note (Signed)
 Well adult Orders Placed This Encounter  Procedures   CMP14+EGFR   CBC with Differential/Platelet   Lipid Panel With LDL/HDL Ratio   TSH   B12  Screenings; per lab orders Immunizations: UTD Anticipatory guidance/Risk factor reduction:  Recommendations per AVS.

## 2023-05-09 NOTE — Progress Notes (Signed)
 Sydney Herman - 43 y.o. female MRN [id number]  Date of birth: [birth year]  Subjective Chief Complaint  Patient presents with   Annual Exam    HPI Sydney Herman is a 43 y.o. female here today for annual exam.   She reports that she is doing well. Followed by oncology for history of breast cancer.  Remains on femara .  Mood stable with lexapro .   She continues to stay pretty active.  She feels like she is doing pretty well with diet.   She is a non-smoker. Occasional EtOh.   Review of Systems  Constitutional:  Negative for chills, fever, malaise/fatigue and weight loss.  HENT:  Negative for congestion, ear pain and sore throat.   Eyes:  Negative for blurred vision, double vision and pain.  Respiratory:  Negative for cough and shortness of breath.   Cardiovascular:  Negative for chest pain and palpitations.  Gastrointestinal:  Negative for abdominal pain, blood in stool, constipation, heartburn and nausea.  Genitourinary:  Negative for dysuria and urgency.  Musculoskeletal:  Negative for joint pain and myalgias.  Neurological:  Negative for dizziness and headaches.  Endo/Heme/Allergies:  Does not bruise/bleed easily.  Psychiatric/Behavioral:  Negative for depression. The patient is not nervous/anxious and does not have insomnia.     Allergies  Allergen Reactions   Amoxil [Amoxicillin] Rash    Past Medical History:  Diagnosis Date   Abdominal wall mass of right lower quadrant 05/17/2013   Saw Dr. Eli Grizzle 2015- was thought to be scar tissue- no growth since that time    Breast cancer [facility]) 01/23/2020   Right   Chicken pox    Family history of esophageal cancer    Family history of prostate cancer    Family history of uterine cancer    Generalized headaches    Has had some migraines in past as well. once a week or less. Usually occipital and associated with sinus pressure as well.    Heartburn in pregnancy     Past Surgical History:  Procedure Laterality Date   BREAST  RECONSTRUCTION WITH PLACEMENT OF TISSUE EXPANDER AND ALLODERM Bilateral 09/09/2020   Procedure: BILATERAL BREAST RECONSTRUCTION WITH PLACEMENT OF TISSUE EXPANDER AND ALLODERM;  Surgeon: Alger Infield, MD;  Location: [facility];  Service: Plastics;  Laterality: Bilateral;   CESAREAN SECTION  07/11/2010   [facility]   CESAREAN SECTION N/A 12/27/2012   womens 2nd    NIPPLE SPARING MASTECTOMY Left 09/09/2020   Procedure: LEFT NIPPLE SPARING MASTECTOMY;  Surgeon: Enid Harry, MD;  Location: [facility];  Service: General;  Laterality: Left;   NIPPLE SPARING MASTECTOMY WITH SENTINEL LYMPH NODE BIOPSY Right 09/09/2020   Procedure: RIGHT NIPPLE SPARING MASTECTOMY WITH RIGHT AXILLARY SENTINEL LYMPH NODE BIOPSY;  Surgeon: Enid Harry, MD;  Location: [facility];  Service: General;  Laterality: Right;   PORTACATH PLACEMENT Right 02/04/2020   Procedure: INSERTION PORT-A-CATH WITH ULTRASOUND;  Surgeon: Enid Harry, MD;  Location: [facility];  Service: General;  Laterality: Right;   REMOVAL OF BILATERAL TISSUE EXPANDERS WITH PLACEMENT OF BILATERAL BREAST IMPLANTS Bilateral 12/15/2020   Procedure: REMOVAL OF BILATERAL TISSUE EXPANDERS WITH PLACEMENT OF BILATERAL BREAST SILICONE IMPLANTS;  Surgeon: Alger Infield, MD;  Location: [facility];  Service: Plastics;  Laterality: Bilateral;   TOTAL ABDOMINAL HYSTERECTOMY N/A    WISDOM TOOTH EXTRACTION      Social History   Socioeconomic History   Marital status: Married    Spouse name:  Not on file   Number of children: 2   Years of education: Not on file   Highest education level: Not on file  Occupational History   Not on file  Tobacco Use   Smoking status: Never   Smokeless tobacco: Never  Vaping Use   Vaping status: Never Used  Substance and Sexual Activity   Alcohol use: Not Currently    Alcohol/week: 2.0 - 3.0 standard drinks of alcohol    Types:  2 - 3 Standard drinks or equivalent per week   Drug use: Never   Sexual activity: Yes    Partners: Male    Birth control/protection: Pill, Condom  Other Topics Concern   Not on file  Social History Narrative   Home Situation: lives with husband, 56 yo and near 56 year old (10/2015)   Husband works as Public affairs consultant in radiation oncology      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      Spiritual Beliefs: Christian      Lifestyle: exercising about 30-45 minutes 4-5 days per week; diet healthy      Hobbies: exercise, Clinical cytogeneticist, church      Social Drivers of Health   Financial Resource Strain: Low Risk  (05/09/2023)   Overall Financial Resource Strain (CARDIA)    Difficulty of Paying Living Expenses: Not hard at all  Food Insecurity: No Food Insecurity (05/09/2023)   Hunger Vital Sign    Worried About Running Out of Food in the Last Year: Never true    Ran Out of Food in the Last Year: Never true  Transportation Needs: No Transportation Needs (05/09/2023)   PRAPARE - Administrator, Civil Service (Medical): No    Lack of Transportation (Non-Medical): No  Physical Activity: Sufficiently Active (05/09/2023)   Exercise Vital Sign    Days of Exercise per Week: 5 days    Minutes of Exercise per Session: 60 min  Stress: No Stress Concern Present (05/09/2023)   [employer] of Occupational Health - Occupational Stress Questionnaire    Feeling of Stress : Only a little  Social Connections: Socially Integrated (05/09/2023)   Social Connection and Isolation Panel [NHANES]    Frequency of Communication with Friends and Family: More than three times a week    Frequency of Social Gatherings with Friends and Family: More than three times a week    Attends Religious Services: More than 4 times per year    Active Member of [employer] or Organizations: Yes    Attends Banker Meetings: 1 to 4 times per year    Marital Status: Married    Family History   Problem Relation Age of Onset   Hypertension Mother    Breast cancer Mother 44       early 32's   Diabetes Father    Atrial fibrillation Father    Other Father        biopsies on kidney- noncancerous. stated potentially precancerous   Prostate cancer Maternal Grandfather 90   Endometrial cancer Paternal Grandmother 43   Esophageal cancer Paternal Uncle        dx early 32s    Health Maintenance  Topic Date Due   Hepatitis C Screening  Never done   COVID-19 Vaccine (3 - Pfizer risk series) 05/22/2019   INFLUENZA VACCINE  08/11/2023   DTaP/Tdap/Td (3 - Td or Tdap) 05/03/2032   HIV Screening  Completed   HPV VACCINES  Aged Out   Meningococcal  B Vaccine  Aged Out     ----------------------------------------------------------------------------------------------------------------------------------------------------------------------------------------------------------------- Physical Exam BP 95/60 (BP Location: Left Arm, Patient Position: Sitting, Cuff Size: Normal)   Pulse (!) 58   Ht 5\' 1"  (1.549 m)   Wt 126 lb (57.2 kg)   LMP  (LMP Unknown)   SpO2 99%   BMI 23.81 kg/m   Physical Exam Constitutional:      General: She is not in acute distress. HENT:     Head: Normocephalic and atraumatic.     Right Ear: Tympanic membrane and ear canal normal.     Left Ear: Tympanic membrane and ear canal normal.     Nose: Nose normal.  Eyes:     General: No scleral icterus.    Conjunctiva/sclera: Conjunctivae normal.  Neck:     Thyroid : No thyromegaly.  Cardiovascular:     Rate and Rhythm: Normal rate and regular rhythm.     Heart sounds: Normal heart sounds.  Pulmonary:     Effort: Pulmonary effort is normal.     Breath sounds: Normal breath sounds.  Abdominal:     General: Bowel sounds are normal. There is no distension.     Palpations: Abdomen is soft.     Tenderness: There is no abdominal tenderness. There is no guarding.  Musculoskeletal:        General: Normal range of  motion.     Cervical back: Normal range of motion and neck supple.  Lymphadenopathy:     Cervical: No cervical adenopathy.  Skin:    General: Skin is warm and dry.     Findings: No rash.  Neurological:     General: No focal deficit present.     Mental Status: She is alert and oriented to person, place, and time.     Cranial Nerves: No cranial nerve deficit.     Coordination: Coordination normal.  Psychiatric:        Mood and Affect: Mood normal.        Behavior: Behavior normal.     ------------------------------------------------------------------------------------------------------------------------------------------------------------------------------------------------------------------- Assessment and Plan  Well adult exam Well adult Orders Placed This Encounter  Procedures   CMP14+EGFR   CBC with Differential/Platelet   Lipid Panel With LDL/HDL Ratio   TSH   B12  Screenings; per lab orders Immunizations: UTD Anticipatory guidance/Risk factor reduction:  Recommendations per AVS.    Meds ordered this encounter  Medications   escitalopram  (LEXAPRO ) 10 MG tablet    Sig: Take 1 tablet (10 mg total) by mouth daily.    Dispense:  90 tablet    Refill:  3    Replaces 5 mg dose, please cancel 5 mg refills, thanks!    No follow-ups on file.

## 2023-05-10 ENCOUNTER — Inpatient Hospital Stay: Payer: Commercial Managed Care - PPO | Attending: Hematology and Oncology | Admitting: Hematology and Oncology

## 2023-05-10 VITALS — BP 109/76 | HR 70 | Temp 98.3°F | Resp 18 | Wt 123.1 lb

## 2023-05-10 DIAGNOSIS — C50411 Malignant neoplasm of upper-outer quadrant of right female breast: Secondary | ICD-10-CM | POA: Diagnosis not present

## 2023-05-10 DIAGNOSIS — Z170 Estrogen receptor positive status [ER+]: Secondary | ICD-10-CM | POA: Insufficient documentation

## 2023-05-10 DIAGNOSIS — Z79811 Long term (current) use of aromatase inhibitors: Secondary | ICD-10-CM | POA: Diagnosis not present

## 2023-05-10 DIAGNOSIS — Z1501 Genetic susceptibility to malignant neoplasm of breast: Secondary | ICD-10-CM | POA: Insufficient documentation

## 2023-05-10 DIAGNOSIS — M8589 Other specified disorders of bone density and structure, multiple sites: Secondary | ICD-10-CM | POA: Diagnosis not present

## 2023-05-10 LAB — CBC WITH DIFFERENTIAL/PLATELET
Basophils Absolute: 0 10*3/uL (ref 0.0–0.2)
Basos: 1 %
EOS (ABSOLUTE): 0.1 10*3/uL (ref 0.0–0.4)
Eos: 1 %
Hematocrit: 41 % (ref 34.0–46.6)
Hemoglobin: 13.2 g/dL (ref 11.1–15.9)
Immature Grans (Abs): 0 10*3/uL (ref 0.0–0.1)
Immature Granulocytes: 0 %
Lymphocytes Absolute: 1.7 10*3/uL (ref 0.7–3.1)
Lymphs: 30 %
MCH: 29.8 pg (ref 26.6–33.0)
MCHC: 32.2 g/dL (ref 31.5–35.7)
MCV: 93 fL (ref 79–97)
Monocytes Absolute: 0.5 10*3/uL (ref 0.1–0.9)
Monocytes: 8 %
Neutrophils Absolute: 3.3 10*3/uL (ref 1.4–7.0)
Neutrophils: 60 %
Platelets: 249 10*3/uL (ref 150–450)
RBC: 4.43 x10E6/uL (ref 3.77–5.28)
RDW: 12.9 % (ref 11.7–15.4)
WBC: 5.6 10*3/uL (ref 3.4–10.8)

## 2023-05-10 LAB — CMP14+EGFR
ALT: 25 IU/L (ref 0–32)
AST: 34 IU/L (ref 0–40)
Albumin: 4.6 g/dL (ref 3.9–4.9)
Alkaline Phosphatase: 69 IU/L (ref 44–121)
BUN/Creatinine Ratio: 23 (ref 9–23)
BUN: 17 mg/dL (ref 6–24)
Bilirubin Total: 0.3 mg/dL (ref 0.0–1.2)
CO2: 24 mmol/L (ref 20–29)
Calcium: 9.7 mg/dL (ref 8.7–10.2)
Chloride: 100 mmol/L (ref 96–106)
Creatinine, Ser: 0.74 mg/dL (ref 0.57–1.00)
Globulin, Total: 2.6 g/dL (ref 1.5–4.5)
Glucose: 87 mg/dL (ref 70–99)
Potassium: 4.8 mmol/L (ref 3.5–5.2)
Sodium: 138 mmol/L (ref 134–144)
Total Protein: 7.2 g/dL (ref 6.0–8.5)
eGFR: 104 mL/min/{1.73_m2} (ref >59)

## 2023-05-10 LAB — LIPID PANEL WITH LDL/HDL RATIO
Cholesterol, Total: 204 mg/dL — ABNORMAL HIGH (ref 100–199)
HDL: 93 mg/dL (ref >39)
LDL Chol Calc (NIH): 101 mg/dL — ABNORMAL HIGH (ref 0–99)
LDL/HDL Ratio: 1.1 ratio (ref 0.0–3.2)
Triglycerides: 52 mg/dL (ref 0–149)
VLDL Cholesterol Cal: 10 mg/dL (ref 5–40)

## 2023-05-10 LAB — VITAMIN B12: Vitamin B-12: 535 pg/mL (ref 232–1245)

## 2023-05-10 LAB — TSH: TSH: 0.56 u[IU]/mL (ref 0.450–4.500)

## 2023-05-10 NOTE — Assessment & Plan Note (Signed)
 01/28/2020: Palpable right breast lump: Mammogram and ultrasound revealed calcifications spanning 2 cm, biopsy revealed IDC with DCIS, grade 2, 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   12/15/2020: Patient completed surgery to replace the expanders with the implants.   Treatment plan: 1. Neo- adjuvant chemotherapy with dose dense Adriamycin  and Cytoxan /Keytruda  followed by Taxol  and carboplatin  completed 08/18/2020 2. bilateral mastectomies:09/09/2020: Left mastectomy: Negative for cancer, right mastectomy: Pathologic complete response 0/3 lymph nodes negative 3. Adjuvant antiestrogen therapy (she had hysterectomy and BSO) Based on extensive discussion back-and-forth we determined that she does not need adjuvant radiation.  _______________________________________________________________________ Current treatment: Letrozole  started March 2023, switched to anastrozole  05/20/2022 switched back to letrozole  06/28/2022 Letrozole  toxicities: 1.  Joint stiffness and achiness: Unfortunately switching from letrozole  to did not make any difference in fact it made it worse because she was having insomnia now   Therefore we decided to switch back to letrozole  again.  She will take magnesium supplement to see if that makes a difference to her muscle aches and pains.   Signatera testing: 03/16/2023: Neg   Breast cancer surveillance: 1.  Breast exam 05/09/2023: Benign 2. no role of imaging studies since she had bilateral mastectomies   Vaginal dryness: I sent a prescription for Estrace    Return to clinic in April 2026

## 2023-05-10 NOTE — Progress Notes (Signed)
 Patient Care Team: Adela Holter, DO as PCP - General (Family Medicine) Ashby Lawman, MD as Consulting Physician (Obstetrics and Gynecology) Cameron Cea, MD as Consulting Physician (Hematology and Oncology) Alger Infield, MD as Consulting Physician (Plastic Surgery) Enid Harry, MD as Consulting Physician (General Surgery) Skinner, Frazier Jacob, MD as Consulting Physician (Obstetrics and Gynecology)  DIAGNOSIS:  Encounter Diagnosis  Name Primary?   Malignant neoplasm of upper-outer quadrant of right breast in female, estrogen receptor positive (HCC) Yes    SUMMARY OF ONCOLOGIC HISTORY: Oncology History  Malignant neoplasm of upper outer quadrant of female breast (HCC)  01/28/2020 Initial Diagnosis   Patient palpated a right breast lump. Diagnostic mammogram and US  showed calcifications spanning 2.0cm in the right breast. Biopsy showed invasive and in situ carcinoma, grade 2. ER 5% weak, PR 0% negative, HER2 equiv, Ki 10%     01/28/2020 Cancer Staging   Staging form: Breast, AJCC 8th Edition - Clinical stage from 01/28/2020: Stage IIA (cT2, cN0, cM0, G2, ER+, PR-, HER2-) - Signed by Bettejane Brownie, PA-C on 10/07/2020 Stage prefix: Initial diagnosis Method of lymph node assessment: Clinical   02/05/2020 - 08/18/2020 Chemotherapy    Patient is on Treatment Plan: BREAST PEMBROLIZUMAB  Q21D      02/13/2020 Genetic Testing   Positive genetic testing:  A single, heterozygous, pathogenic variant was detected in the BRCA2 gene called [id number]. Testing was completed through the CustomNext-Cancer + RNAinsight panel offered by [facility]. 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 The report date is 02/13/2020.  The CustomNext-Cancer+RNAinsight panel offered by [facility] includes sequencing and rearrangement analysis for the following 47 genes:  APC, ATM, AXIN2, BARD1, BMPR1A, BRCA1,  BRCA2, BRIP1, CDH1, CDK4, CDKN2A, CHEK2, DICER1, EPCAM, GREM1, HOXB13, MEN1, MLH1, MSH2, MSH3, MSH6, MUTYH, NBN, NF1, NF2, NTHL1, PALB2, PMS2, POLD1, POLE, PTEN, RAD51C, RAD51D, RECQL, RET, SDHA, SDHAF2, SDHB, SDHC, SDHD, SMAD4, SMARCA4, STK11, TP53, TSC1, TSC2, and VHL.  RNA data is routinely analyzed for use in variant interpretation for all genes.   08/25/2020 - 02/03/2021 Chemotherapy   Patient is on Treatment Plan : BREAST Pembrolizumab  q21d      09/09/2020 Definitive Surgery   FINAL MICROSCOPIC DIAGNOSIS:   A. BREAST, LEFT, MASTECTOMY:  - Mild fibrocystic change with usual ductal hyperplasia and  calcifications  - Negative for carcinoma   B. BREAST, LEFT NIPPLE, BIOPSY:  - Focal atypical lobular hyperplasia  - Mild fibrocystic change  - Negative for carcinoma   C. LYMPH NODE, RIGHT AXILLARY, SENTINEL, EXCISION:  - Lymph node, negative for carcinoma (0/1)   D. LYMPH NODE, RIGHT AXILLARY, SENTINEL, EXCISION:  - Lymph node, negative for carcinoma (0/1)   E. LYMPH NODE, RIGHT AXILLARY, SENTINEL, EXCISION:  - Lymph node, negative for carcinoma (0/1)   F. BREAST, RIGHT, MASTECTOMY:  - Negative for residual carcinoma - complete therapeutic response  - Mild fibrocystic change with calcifications   G. BREAST, RIGHT NIPPLE, BIOPSY:  - Negative for residual carcinoma - complete therapeutic response  - Mild fibrocystic change    02/09/2021 Surgery   Hysterectomy with BSO     CHIEF COMPLIANT: Surveillance of breast cancer on letrozole  therapy  HISTORY OF PRESENT ILLNESS:   History of Present Illness Sydney Herman is a 43 year old female with estrogen positive breast cancer currently on letrozole  who presents for follow-up on her treatment and to discuss bone health.  She is currently on letrozole  for her estrogen positive breast  cancer and experiences occasional joint pain, which she describes as manageable. In January, she underwent a Signatera test which was negative. Her bone  density evaluation in February showed osteopenia in her back, with unaffected hips.  Denies any pain lumps or nodules in bilateral reconstructed breast.     ALLERGIES:  is allergic to amoxil [amoxicillin].  MEDICATIONS:  Current Outpatient Medications  Medication Sig Dispense Refill   cycloSPORINE  (RESTASIS ) 0.05 % ophthalmic emulsion Instill 1 drop into both eyes twice a day. 180 each 4   escitalopram  (LEXAPRO ) 10 MG tablet Take 1 tablet (10 mg total) by mouth daily. 90 tablet 3   estradiol  (ESTRACE  VAGINAL) 0.1 MG/GM vaginal cream Insert 1 Applicatorful vaginally at bedtime. 42.5 g 12   letrozole  (FEMARA ) 2.5 MG tablet Take 1 tablet (2.5 mg total) by mouth daily. 90 tablet 4   No current facility-administered medications for this visit.    PHYSICAL EXAMINATION: ECOG PERFORMANCE STATUS: 1 - Symptomatic but completely ambulatory  Vitals:   05/10/23 0914  BP: 109/76  Pulse: 70  Resp: 18  Temp: 98.3 F (36.8 C)  SpO2: 100%   Filed Weights   05/10/23 0914  Weight: 123 lb 1.6 oz (55.8 kg)    Physical Exam No palpable lumps or nodules in bilateral reconstructed breasts or axilla  (exam performed in the presence of a chaperone)  LABORATORY DATA:  I have reviewed the data as listed    Latest Ref Rng & Units 05/09/2023    8:59 AM 10/06/2022    8:21 AM 05/04/2022   11:29 AM  CMP  Glucose 70 - 99 mg/dL 87  83  90   BUN 6 - 24 mg/dL 17  19  13    Creatinine 0.57 - 1.00 mg/dL 4.78  2.95  6.21   Sodium 134 - 144 mmol/L 138  138  139   Potassium 3.5 - 5.2 mmol/L 4.8  4.6  4.5   Chloride 96 - 106 mmol/L 100  99  101   CO2 20 - 29 mmol/L 24  25  27    Calcium 8.7 - 10.2 mg/dL 9.7  9.9  30.8   Total Protein 6.0 - 8.5 g/dL 7.2   8.2   Total Bilirubin 0.0 - 1.2 mg/dL 0.3   0.6   Alkaline Phos 44 - 121 IU/L 69     AST 0 - 40 IU/L 34   26   ALT 0 - 32 IU/L 25   19     Lab Results  Component Value Date   WBC 5.6 05/09/2023   HGB 13.2 05/09/2023   HCT 41.0 05/09/2023   MCV 93  05/09/2023   PLT 249 05/09/2023   NEUTROABS 3.3 05/09/2023    ASSESSMENT & PLAN:  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: Palpable right breast lump: Mammogram and ultrasound revealed calcifications spanning 2 cm, biopsy revealed IDC with DCIS, grade 2, 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   12/15/2020: Patient completed surgery to replace the expanders with the implants.   Treatment plan: 1. Neo- adjuvant chemotherapy with dose dense Adriamycin  and Cytoxan /Keytruda  followed by Taxol  and carboplatin  completed 08/18/2020 2. bilateral mastectomies:09/09/2020: Left mastectomy: Negative for cancer, right mastectomy: Pathologic complete response 0/3 lymph nodes negative 3. Adjuvant antiestrogen therapy (she had hysterectomy and BSO) Based on  extensive discussion back-and-forth we determined that she does not need adjuvant radiation.  _______________________________________________________________________ Current treatment: Letrozole  started March 2023, switched to anastrozole  05/20/2022 switched back to letrozole  06/28/2022 Letrozole  toxicities: 1.  Joint stiffness and achiness: Unfortunately switching from letrozole  to did not make any difference in fact it made it worse because she was having insomnia now   Therefore we decided to switch back to letrozole  again.  She will take magnesium supplement to see if that makes a difference to her muscle aches and pains.   Signatera testing: 01/2023: Neg   Breast cancer surveillance: 1.  Breast exam 05/09/2023: Benign 2. no role of imaging studies since she had bilateral mastectomies   Bone density: T-score -1.5: Osteopenia: Recommended calcium vitamin D . I discussed with her about Zometa infusion every 6 months x 4 both for her bone  health as well as for breast cancer prevention.  She will read the studies and will decide on it.  Vaginal dryness: on Estrace    Return to clinic in April 2026 ------------------------------------- Assessment and Plan Assessment & Plan Malignant neoplasm of upper outer quadrant of female breast Managed with Letrozole , well-tolerated with occasional arthralgia. Signatera preferred for monitoring due to superior detection of microscopic disease. - Continue Letrozole . - Continue Signatera testing every six months.  Osteopenia Osteopenia in spine, normal hip density. Discussed Zometa to improve bone density and reduce breast cancer recurrence risk. 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 - Consider Zometa (zoledronic acid) 4 mg IV every six months for two years. - Check calcium levels and renal function prior to Zometa administration.      No orders of the defined types were placed in this encounter.  The patient has a good understanding of the overall plan. she agrees with it. she will call with any problems that may develop before the next visit here. Total time spent: 30 mins including face to face time and time spent for planning, charting and co-ordination of care   Viinay K Cate Oravec, MD 05/10/23

## 2023-05-17 ENCOUNTER — Encounter: Payer: Self-pay | Admitting: Family Medicine

## 2023-05-30 DIAGNOSIS — Z1151 Encounter for screening for human papillomavirus (HPV): Secondary | ICD-10-CM | POA: Diagnosis not present

## 2023-05-30 DIAGNOSIS — Z1272 Encounter for screening for malignant neoplasm of vagina: Secondary | ICD-10-CM | POA: Diagnosis not present

## 2023-05-30 DIAGNOSIS — Z6823 Body mass index (BMI) 23.0-23.9, adult: Secondary | ICD-10-CM | POA: Diagnosis not present

## 2023-05-30 DIAGNOSIS — Z01419 Encounter for gynecological examination (general) (routine) without abnormal findings: Secondary | ICD-10-CM | POA: Diagnosis not present

## 2023-05-31 DIAGNOSIS — Z170 Estrogen receptor positive status [ER+]: Secondary | ICD-10-CM | POA: Diagnosis not present

## 2023-05-31 DIAGNOSIS — C50411 Malignant neoplasm of upper-outer quadrant of right female breast: Secondary | ICD-10-CM | POA: Diagnosis not present

## 2023-06-09 ENCOUNTER — Telehealth: Payer: Self-pay

## 2023-06-09 LAB — SIGNATERA
SIGNATERA MTM READOUT: 0 MTM/ml
SIGNATERA TEST RESULT: NEGATIVE

## 2023-06-09 NOTE — Telephone Encounter (Signed)
Called pt per MD to advise [employer] testing was negative/not detected. Pt verbalized understanding of results and knows [employer] will be in touch to schedule 3 mo repeat lab.   

## 2023-06-12 DIAGNOSIS — H5213 Myopia, bilateral: Secondary | ICD-10-CM | POA: Diagnosis not present

## 2023-07-24 ENCOUNTER — Ambulatory Visit: Payer: Commercial Managed Care - PPO | Attending: Hematology and Oncology

## 2023-07-24 VITALS — Wt 123.0 lb

## 2023-07-24 DIAGNOSIS — Z483 Aftercare following surgery for neoplasm: Secondary | ICD-10-CM | POA: Insufficient documentation

## 2023-07-24 NOTE — Therapy (Signed)
 OUTPATIENT PHYSICAL THERAPY SOZO SCREENING NOTE   Patient Name: Sydney Herman MRN: [id number] DOB:12-16-1980, 43 y.o., female Today's Date: 07/24/2023  PCP: Alvia Bring, DO REFERRING PROVIDER: Odean Potts, MD   PT End of Session - 07/24/23 0941     Visit Number 12   # unchanged due to screen only   PT Start Time 0939    PT Stop Time 0943    PT Time Calculation (min) 4 min    Activity Tolerance Patient tolerated treatment well    Behavior During Therapy [facility] for tasks assessed/performed          Past Medical History:  Diagnosis Date   Abdominal wall mass of right lower quadrant 05/17/2013   Saw Dr. Belinda 2015- was thought to be scar tissue- no growth since that time    Breast cancer [facility]) 01/23/2020   Right   Chicken pox    Family history of esophageal cancer    Family history of prostate cancer    Family history of uterine cancer    Generalized headaches    Has had some migraines in past as well. once a week or less. Usually occipital and associated with sinus pressure as well.    Heartburn in pregnancy    Past Surgical History:  Procedure Laterality Date   BREAST RECONSTRUCTION WITH PLACEMENT OF TISSUE EXPANDER AND ALLODERM Bilateral 09/09/2020   Procedure: BILATERAL BREAST RECONSTRUCTION WITH PLACEMENT OF TISSUE EXPANDER AND ALLODERM;  Surgeon: Arelia Filippo, MD;  Location: [facility];  Service: Plastics;  Laterality: Bilateral;   CESAREAN SECTION  07/11/2010   [facility]   CESAREAN SECTION N/A 12/27/2012   womens 2nd    NIPPLE SPARING MASTECTOMY Left 09/09/2020   Procedure: LEFT NIPPLE SPARING MASTECTOMY;  Surgeon: Ebbie Cough, MD;  Location: [facility];  Service: General;  Laterality: Left;   NIPPLE SPARING MASTECTOMY WITH SENTINEL LYMPH NODE BIOPSY Right 09/09/2020   Procedure: RIGHT NIPPLE SPARING MASTECTOMY WITH RIGHT AXILLARY SENTINEL LYMPH NODE BIOPSY;  Surgeon: Ebbie Cough, MD;  Location: [facility];  Service: General;  Laterality: Right;   PORTACATH PLACEMENT Right 02/04/2020   Procedure: INSERTION PORT-A-CATH WITH ULTRASOUND;  Surgeon: Ebbie Cough, MD;  Location: [facility];  Service: General;  Laterality: Right;   REMOVAL OF BILATERAL TISSUE EXPANDERS WITH PLACEMENT OF BILATERAL BREAST IMPLANTS Bilateral 12/15/2020   Procedure: REMOVAL OF BILATERAL TISSUE EXPANDERS WITH PLACEMENT OF BILATERAL BREAST SILICONE IMPLANTS;  Surgeon: Arelia Filippo, MD;  Location: [facility];  Service: Plastics;  Laterality: Bilateral;   TOTAL ABDOMINAL HYSTERECTOMY N/A    WISDOM TOOTH EXTRACTION     Patient Active Problem List   Diagnosis Date Noted   Well adult exam 04/27/2021   Adjustment reaction with anxiety 04/27/2021   Malignant tumor of breast (HCC) 05/12/2020   Port-A-Cath in place 03/19/2020   BRCA2 gene mutation positive 02/21/2020   Family history of prostate cancer    Family history of uterine cancer    Family history of esophageal cancer    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   History of abnormal cervical Pap smear 11/16/2018   Family history of type 2 diabetes mellitus in father 11/16/2018   Family history of breast cancer in mother 11/16/2018   Mild hyperlipidemia 11/02/2017   Vitamin B12 deficiency 02/20/2017   LGSIL (low grade squamous intraepithelial lesion) on Pap smear 10/02/2011    REFERRING DIAG: right breast cancer at risk for  lymphedema  THERAPY DIAG: Aftercare following surgery for neoplasm  PERTINENT HISTORY: Patient was diagnosed on 01/27/2020 with 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 She underwent neoadjuvant chemotherapy 02/05/2020 - 08/18/2020 followed by a bilateral mastectomy and right sentinel node biopsy (3 negative nodes) on 09/09/2020. The mass measures 2 cm and is located in the upper outer quadrant with a Ki67 of 10%.   PRECAUTIONS: right UE  Lymphedema risk, None  SUBJECTIVE: Pt returns for her 6 month L-Dex screen.   PAIN:  Are you having pain? No  SOZO SCREENING: Patient was assessed today using the SOZO machine to determine the lymphedema index score. This was compared to her baseline score. It was determined that she is within the recommended range when compared to her baseline and no further action is needed at this time. She will continue SOZO screenings. These are done every 3 months for 2 years post operatively followed by every 6 months for 2 years, and then annually.    L-DEX FLOWSHEETS - 07/24/23 0900       L-DEX LYMPHEDEMA SCREENING   Measurement Type Unilateral    L-DEX MEASUREMENT EXTREMITY Upper Extremity    POSITION  Standing    DOMINANT SIDE Right    At Risk Side Right    BASELINE SCORE (UNILATERAL) -2.8    L-DEX SCORE (UNILATERAL) -0.6    VALUE CHANGE (UNILAT) 2.2           Aden Berwyn Caldron, PTA 07/24/2023, 9:42 AM

## 2023-07-31 ENCOUNTER — Ambulatory Visit: Admitting: Family Medicine

## 2023-07-31 ENCOUNTER — Ambulatory Visit: Payer: Self-pay | Admitting: *Deleted

## 2023-07-31 ENCOUNTER — Encounter: Payer: Self-pay | Admitting: Family Medicine

## 2023-07-31 VITALS — BP 104/78 | HR 75 | Ht 61.0 in | Wt 123.8 lb

## 2023-07-31 DIAGNOSIS — R0981 Nasal congestion: Secondary | ICD-10-CM | POA: Diagnosis not present

## 2023-07-31 DIAGNOSIS — H6993 Unspecified Eustachian tube disorder, bilateral: Secondary | ICD-10-CM | POA: Diagnosis not present

## 2023-07-31 NOTE — Telephone Encounter (Signed)
   FYI Only or Action Required?: Action required by provider: request for appointment.  Patient was last seen in primary care on 05/09/2023 by Alvia Bring, DO.  Called Nurse Triage reporting Otalgia.  Symptoms began about a month ago.  Interventions attempted: OTC medications: sinus medication OTC.  Symptoms are: gradually worsening.  Triage Disposition: See Physician Within 24 Hours  Patient/caregiver understands and will follow disposition?: Yes                Copied from CRM [id number]. Topic: Clinical - Red Word Triage >> Jul 31, 2023  8:29 AM Merlynn LABOR wrote: Red Word that prompted transfer to Nurse Triage: Ear pain/Congestion/Sinus Congestion Reason for Disposition  Earache  (Exceptions: Brief ear pain of lasting less than 60 minutes, or earache occurring during air travel.)  Answer Assessment - Initial Assessment Questions Scheduled appt today with other provider due to dizziness at times.       1. LOCATION: Which ear is involved?     Both  2. ONSET: When did the ear pain start?      June 26-27 with sinus congestion 3. SEVERITY: How bad is the pain?  (Scale 1-10; mild, moderate or severe)     Constant need to 'pop ears 4. URI SYMPTOMS: Do you have a runny nose or cough?     Constant drainage  5. FEVER: Do you have a fever? If Yes, ask: What is your temperature, how was it measured, and when did it start?     na 6. CAUSE: Have you been swimming recently?, How often do you use Q-TIPS?, Have you had any recent air travel or scuba diving?     na 7. OTHER SYMPTOMS: Do you have any other symptoms? (e.g., decreased hearing, dizziness, headache, stiff neck, vomiting)     Dizziness at times. Bilateral ear popping , recent flying last  week  8. PREGNANCY: Is there any chance you are pregnant? When was your last menstrual period?     na  Protocols used: [place]

## 2023-07-31 NOTE — Progress Notes (Signed)
   Established Patient Office Visit  Subjective  Patient ID: Sydney Herman, female    DOB: [birth year]  Age: 43 y.o. MRN: [id number]  Chief Complaint  Patient presents with   Ear Pain    HPI  She says that about 3 weeks ago she had significant nasal congestion.  Around that time she was traveling and took some decongestant over-the-counter products.  Since then though her ears have had a lot of pressure they been popping a lot she can get relief if she pops them.  She has had a little bit of sore throat just in the mornings she has had a lot of postnasal drip and drainage.  And occasional maxillary sinus pressure.  No fevers or chills.    ROS    Objective:     BP 104/78   Pulse 75   Ht 5' 1 (1.549 m)   Wt 123 lb 12.8 oz (56.2 kg)   LMP  (LMP Unknown)   SpO2 100%   BMI 23.39 kg/m    Physical Exam Constitutional:      Appearance: Normal appearance.  HENT:     Head: Normocephalic and atraumatic.     Right Ear: Ear canal and external ear normal. There is no impacted cerumen.     Left Ear: Tympanic membrane, ear canal and external ear normal. There is no impacted cerumen.     Ears:     Comments: Small effusion behind the TM    Nose: Nose normal.     Mouth/Throat:     Pharynx: Oropharynx is clear.  Eyes:     Conjunctiva/sclera: Conjunctivae normal.  Cardiovascular:     Rate and Rhythm: Normal rate and regular rhythm.  Pulmonary:     Effort: Pulmonary effort is normal.     Breath sounds: Normal breath sounds.  Musculoskeletal:     Cervical back: Neck supple. No tenderness.  Lymphadenopathy:     Cervical: No cervical adenopathy.  Skin:    General: Skin is warm and dry.  Neurological:     Mental Status: She is alert and oriented to person, place, and time.  Psychiatric:        Mood and Affect: Mood normal.      No results found for any visits on 07/31/23.    The 10-year ASCVD risk score (Arnett DK, et al., 2019) is: 0.2%    Assessment & Plan:   Problem  List Items Addressed This Visit   None Visit Diagnoses       Dysfunction of both eustachian tubes    -  Primary     Nasal congestion           Eustachian tube dysfunction with a little bit of an effusion in the right TM.  We did discuss using a nasal steroid spray she is pretty sure she has Nasacort  at home so okay to start with 2 sprays in each nostril daily for the first week.  If she wants and add an oral antihistamine just to help with her drainage and to dry things up a little bit she could certainly do that as well.  If not feeling at least some relief by the end of the week then recommend a trial of prednisone .  She did fly yesterday and that made it a little worse.  No follow-ups on file.    Dorothyann Byars, MD

## 2023-08-17 DIAGNOSIS — L814 Other melanin hyperpigmentation: Secondary | ICD-10-CM | POA: Diagnosis not present

## 2023-08-17 DIAGNOSIS — D225 Melanocytic nevi of trunk: Secondary | ICD-10-CM | POA: Diagnosis not present

## 2023-08-17 DIAGNOSIS — L821 Other seborrheic keratosis: Secondary | ICD-10-CM | POA: Diagnosis not present

## 2023-08-17 DIAGNOSIS — L579 Skin changes due to chronic exposure to nonionizing radiation, unspecified: Secondary | ICD-10-CM | POA: Diagnosis not present

## 2023-08-17 DIAGNOSIS — D235 Other benign neoplasm of skin of trunk: Secondary | ICD-10-CM | POA: Diagnosis not present

## 2023-08-31 DIAGNOSIS — Z170 Estrogen receptor positive status [ER+]: Secondary | ICD-10-CM | POA: Diagnosis not present

## 2023-08-31 DIAGNOSIS — C50411 Malignant neoplasm of upper-outer quadrant of right female breast: Secondary | ICD-10-CM | POA: Diagnosis not present

## 2023-09-05 ENCOUNTER — Other Ambulatory Visit: Payer: Self-pay | Admitting: Hematology and Oncology

## 2023-09-06 ENCOUNTER — Other Ambulatory Visit (HOSPITAL_COMMUNITY): Payer: Self-pay

## 2023-09-06 DIAGNOSIS — Z853 Personal history of malignant neoplasm of breast: Secondary | ICD-10-CM | POA: Diagnosis not present

## 2023-09-06 DIAGNOSIS — Z1501 Genetic susceptibility to malignant neoplasm of breast: Secondary | ICD-10-CM | POA: Diagnosis not present

## 2023-09-06 DIAGNOSIS — Z1509 Genetic susceptibility to other malignant neoplasm: Secondary | ICD-10-CM | POA: Diagnosis not present

## 2023-09-06 DIAGNOSIS — Z9013 Acquired absence of bilateral breasts and nipples: Secondary | ICD-10-CM | POA: Diagnosis not present

## 2023-09-06 MED ORDER — ESTRADIOL 0.1 MG/GM VA CREA
1.0000 | TOPICAL_CREAM | Freq: Every day | VAGINAL | 12 refills | Status: AC
  Filled 2023-09-06: qty 42.5, 90d supply, fill #0

## 2023-09-12 LAB — SIGNATERA
SIGNATERA MTM READOUT: 0 MTM/ml
SIGNATERA TEST RESULT: NEGATIVE

## 2023-10-12 DIAGNOSIS — C50411 Malignant neoplasm of upper-outer quadrant of right female breast: Secondary | ICD-10-CM | POA: Diagnosis not present

## 2023-10-12 DIAGNOSIS — Z170 Estrogen receptor positive status [ER+]: Secondary | ICD-10-CM | POA: Diagnosis not present

## 2023-10-16 ENCOUNTER — Other Ambulatory Visit (HOSPITAL_COMMUNITY): Payer: Self-pay

## 2023-10-20 ENCOUNTER — Other Ambulatory Visit: Payer: Self-pay | Admitting: *Deleted

## 2023-10-20 ENCOUNTER — Encounter: Payer: Self-pay | Admitting: Hematology and Oncology

## 2023-10-20 DIAGNOSIS — M8580 Other specified disorders of bone density and structure, unspecified site: Secondary | ICD-10-CM

## 2023-10-20 DIAGNOSIS — C50411 Malignant neoplasm of upper-outer quadrant of right female breast: Secondary | ICD-10-CM

## 2023-10-20 NOTE — Progress Notes (Signed)
 Per MD request, orders placed for bone density.

## 2023-11-01 ENCOUNTER — Other Ambulatory Visit (HOSPITAL_COMMUNITY): Payer: Self-pay

## 2023-11-24 ENCOUNTER — Other Ambulatory Visit: Payer: Self-pay | Admitting: Hematology and Oncology

## 2023-11-24 ENCOUNTER — Other Ambulatory Visit: Payer: Self-pay

## 2023-11-24 MED ORDER — LETROZOLE 2.5 MG PO TABS
2.5000 mg | ORAL_TABLET | Freq: Every day | ORAL | 4 refills | Status: AC
  Filled 2023-11-24: qty 90, 90d supply, fill #0

## 2023-11-30 ENCOUNTER — Other Ambulatory Visit (HOSPITAL_COMMUNITY): Payer: Self-pay

## 2023-11-30 MED ORDER — ESTRADIOL 0.01 % VA CREA
TOPICAL_CREAM | VAGINAL | 11 refills | Status: AC
  Filled 2023-11-30: qty 42.5, 90d supply, fill #0

## 2023-12-01 ENCOUNTER — Other Ambulatory Visit: Payer: Self-pay

## 2023-12-21 ENCOUNTER — Ambulatory Visit (HOSPITAL_BASED_OUTPATIENT_CLINIC_OR_DEPARTMENT_OTHER)
Admission: RE | Admit: 2023-12-21 | Discharge: 2023-12-21 | Disposition: A | Discharge status: Home / Self Care | Source: Ambulatory Visit | Attending: Hematology and Oncology | Admitting: Hematology and Oncology

## 2023-12-21 DIAGNOSIS — M8580 Other specified disorders of bone density and structure, unspecified site: Secondary | ICD-10-CM | POA: Insufficient documentation

## 2023-12-21 DIAGNOSIS — C50411 Malignant neoplasm of upper-outer quadrant of right female breast: Secondary | ICD-10-CM | POA: Diagnosis present

## 2023-12-21 DIAGNOSIS — M8589 Other specified disorders of bone density and structure, multiple sites: Secondary | ICD-10-CM | POA: Diagnosis not present

## 2023-12-21 DIAGNOSIS — Z170 Estrogen receptor positive status [ER+]: Secondary | ICD-10-CM | POA: Diagnosis present

## 2023-12-21 DIAGNOSIS — Z780 Asymptomatic menopausal state: Secondary | ICD-10-CM | POA: Diagnosis not present

## 2023-12-27 ENCOUNTER — Other Ambulatory Visit (HOSPITAL_COMMUNITY): Payer: Self-pay

## 2023-12-27 MED ORDER — CYCLOSPORINE 0.05 % OP EMUL
1.0000 [drp] | Freq: Two times a day (BID) | OPHTHALMIC | 4 refills | Status: AC
  Filled 2023-12-27 (×2): qty 180, 90d supply, fill #0

## 2024-01-22 ENCOUNTER — Ambulatory Visit: Attending: Hematology and Oncology

## 2024-01-22 VITALS — Wt 125.2 lb

## 2024-01-22 DIAGNOSIS — Z483 Aftercare following surgery for neoplasm: Secondary | ICD-10-CM | POA: Insufficient documentation

## 2024-01-22 NOTE — Therapy (Signed)
 " OUTPATIENT PHYSICAL THERAPY SOZO SCREENING NOTE   Patient Name: Sydney Herman MRN: [id number] DOB:04-22-1980, 44 y.o., female Today's Date: 01/22/2024  PCP: Alvia Bring, DO REFERRING PROVIDER: Odean Potts, MD   PT End of Session - 01/22/24 [phone]     Visit Number 12   # unchanged due to screen only   PT Start Time 0941    PT Stop Time 0945    PT Time Calculation (min) 4 min    Activity Tolerance Patient tolerated treatment well    Behavior During Therapy [facility] for tasks assessed/performed          Past Medical History:  Diagnosis Date   Abdominal wall mass of right lower quadrant 05/17/2013   Saw Dr. Belinda 2015- was thought to be scar tissue- no growth since that time    Breast cancer (HCC) 01/23/2020   Right   Chicken pox    Family history of esophageal cancer    Family history of prostate cancer    Family history of uterine cancer    Generalized headaches    Has had some migraines in past as well. once a week or less. Usually occipital and associated with sinus pressure as well.    Heartburn in pregnancy    Past Surgical History:  Procedure Laterality Date   BREAST RECONSTRUCTION WITH PLACEMENT OF TISSUE EXPANDER AND ALLODERM Bilateral 09/09/2020   Procedure: BILATERAL BREAST RECONSTRUCTION WITH PLACEMENT OF TISSUE EXPANDER AND ALLODERM;  Surgeon: Arelia Filippo, MD;  Location: [facility];  Service: Plastics;  Laterality: Bilateral;   CESAREAN SECTION  07/11/2010   [facility]   CESAREAN SECTION N/A 12/27/2012   womens 2nd    NIPPLE SPARING MASTECTOMY Left 09/09/2020   Procedure: LEFT NIPPLE SPARING MASTECTOMY;  Surgeon: Ebbie Cough, MD;  Location: [facility];  Service: General;  Laterality: Left;   NIPPLE SPARING MASTECTOMY WITH SENTINEL LYMPH NODE BIOPSY Right 09/09/2020   Procedure: RIGHT NIPPLE SPARING MASTECTOMY WITH RIGHT AXILLARY SENTINEL LYMPH NODE BIOPSY;  Surgeon: Ebbie Cough, MD;  Location: [facility];  Service: General;  Laterality: Right;   PORTACATH PLACEMENT Right 02/04/2020   Procedure: INSERTION PORT-A-CATH WITH ULTRASOUND;  Surgeon: Ebbie Cough, MD;  Location: [facility];  Service: General;  Laterality: Right;   REMOVAL OF BILATERAL TISSUE EXPANDERS WITH PLACEMENT OF BILATERAL BREAST IMPLANTS Bilateral 12/15/2020   Procedure: REMOVAL OF BILATERAL TISSUE EXPANDERS WITH PLACEMENT OF BILATERAL BREAST SILICONE IMPLANTS;  Surgeon: Arelia Filippo, MD;  Location: [facility];  Service: Plastics;  Laterality: Bilateral;   TOTAL ABDOMINAL HYSTERECTOMY N/A    WISDOM TOOTH EXTRACTION     Patient Active Problem List   Diagnosis Date Noted   Well adult exam 04/27/2021   Adjustment reaction with anxiety 04/27/2021   Malignant tumor of breast (HCC) 05/12/2020   Port-A-Cath in place 03/19/2020   BRCA2 gene mutation positive 02/21/2020   Family history of prostate cancer    Family history of uterine cancer    Family history of esophageal cancer    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 She underwent neoadjuvant chemotherapy 02/05/2020 - 08/18/2020 followed by a bilateral mastectomy and right sentinel node biopsy (3 negative nodes) on 09/09/2020. The mass measures 2 cm and is located in the upper outer quadrant with a Ki67 of 10%.   PRECAUTIONS: right UE  Lymphedema risk, None  SUBJECTIVE: Pt returns for her 6 month L-Dex screen. I fly to [place] tomorrow for my friends sons wedding.  PAIN:  Are you having pain? No  SOZO SCREENING: Patient was assessed today using the SOZO machine to determine the lymphedema index score. This was compared to her baseline score. It was determined that she is within the recommended range when compared to her baseline and no further action is needed at this time. She will continue SOZO screenings. These are done every 3 months for 2 years post operatively followed by every 6 months for 2 years, and then annually.    L-DEX FLOWSHEETS - 01/22/24 0900       L-DEX LYMPHEDEMA SCREENING   Measurement Type Unilateral    L-DEX MEASUREMENT EXTREMITY Upper Extremity    POSITION  Standing    DOMINANT SIDE Right    At Risk Side Right    BASELINE SCORE (UNILATERAL) -2.8    L-DEX SCORE (UNILATERAL) -1.6    VALUE CHANGE (UNILAT) 1.2         P: Cont every 6 month L-Dex screens until 08/2024 then transition to annual.   Aden Berwyn Caldron, PTA 01/22/2024, 9:43 AM    "

## 2024-01-31 ENCOUNTER — Other Ambulatory Visit (HOSPITAL_COMMUNITY): Payer: Self-pay

## 2024-02-13 ENCOUNTER — Other Ambulatory Visit: Payer: Self-pay | Admitting: *Deleted

## 2024-02-13 DIAGNOSIS — C50411 Malignant neoplasm of upper-outer quadrant of right female breast: Secondary | ICD-10-CM

## 2024-02-13 NOTE — Progress Notes (Signed)
 [employer] renewal orders placed.

## 2024-05-09 ENCOUNTER — Encounter: Admitting: Family Medicine

## 2024-05-09 ENCOUNTER — Inpatient Hospital Stay: Admitting: Hematology and Oncology

## 2024-07-22 ENCOUNTER — Ambulatory Visit: Attending: Hematology and Oncology
